# Patient Record
Sex: Female | Born: 1955 | Race: White | Hispanic: No | Marital: Married | Smoking: Former smoker
Health system: Southern US, Community
[De-identification: ages and names within clinical notes are randomized; demographics above are authoritative.]

## PROBLEM LIST (undated history)

## (undated) ENCOUNTER — Ambulatory Visit: Admission: EM | Disposition: A | Discharge status: Home / Self Care | Payer: Medicare (Managed Care)

## (undated) DIAGNOSIS — T4145XA Adverse effect of unspecified anesthetic, initial encounter: Secondary | ICD-10-CM

## (undated) DIAGNOSIS — R0602 Shortness of breath: Secondary | ICD-10-CM

## (undated) DIAGNOSIS — I10 Essential (primary) hypertension: Secondary | ICD-10-CM

## (undated) DIAGNOSIS — F419 Anxiety disorder, unspecified: Secondary | ICD-10-CM

## (undated) DIAGNOSIS — T8859XA Other complications of anesthesia, initial encounter: Secondary | ICD-10-CM

## (undated) DIAGNOSIS — K7581 Nonalcoholic steatohepatitis (NASH): Secondary | ICD-10-CM

## (undated) DIAGNOSIS — H269 Unspecified cataract: Secondary | ICD-10-CM

## (undated) DIAGNOSIS — E7800 Pure hypercholesterolemia, unspecified: Secondary | ICD-10-CM

## (undated) DIAGNOSIS — M7989 Other specified soft tissue disorders: Secondary | ICD-10-CM

## (undated) DIAGNOSIS — K8020 Calculus of gallbladder without cholecystitis without obstruction: Secondary | ICD-10-CM

## (undated) DIAGNOSIS — K7460 Unspecified cirrhosis of liver: Secondary | ICD-10-CM

## (undated) DIAGNOSIS — K219 Gastro-esophageal reflux disease without esophagitis: Secondary | ICD-10-CM

## (undated) DIAGNOSIS — N200 Calculus of kidney: Secondary | ICD-10-CM

## (undated) DIAGNOSIS — F329 Major depressive disorder, single episode, unspecified: Secondary | ICD-10-CM

## (undated) DIAGNOSIS — IMO0002 Reserved for concepts with insufficient information to code with codable children: Secondary | ICD-10-CM

## (undated) DIAGNOSIS — K760 Fatty (change of) liver, not elsewhere classified: Secondary | ICD-10-CM

## (undated) DIAGNOSIS — T7840XA Allergy, unspecified, initial encounter: Secondary | ICD-10-CM

## (undated) DIAGNOSIS — C801 Malignant (primary) neoplasm, unspecified: Secondary | ICD-10-CM

## (undated) DIAGNOSIS — M810 Age-related osteoporosis without current pathological fracture: Secondary | ICD-10-CM

## (undated) DIAGNOSIS — N189 Chronic kidney disease, unspecified: Secondary | ICD-10-CM

## (undated) DIAGNOSIS — E669 Obesity, unspecified: Secondary | ICD-10-CM

## (undated) DIAGNOSIS — K859 Acute pancreatitis without necrosis or infection, unspecified: Secondary | ICD-10-CM

## (undated) DIAGNOSIS — F32A Depression, unspecified: Secondary | ICD-10-CM

## (undated) HISTORY — DX: Allergy, unspecified, initial encounter: T78.40XA

## (undated) HISTORY — DX: Major depressive disorder, single episode, unspecified: F32.9

## (undated) HISTORY — DX: Essential (primary) hypertension: I10

## (undated) HISTORY — PX: BREAST SURGERY: SHX581

## (undated) HISTORY — DX: Unspecified cirrhosis of liver: K74.60

## (undated) HISTORY — DX: Age-related osteoporosis without current pathological fracture: M81.0

## (undated) HISTORY — PX: BACK SURGERY: SHX140

## (undated) HISTORY — DX: Other specified soft tissue disorders: M79.89

## (undated) HISTORY — DX: Anxiety disorder, unspecified: F41.9

## (undated) HISTORY — DX: Acute pancreatitis without necrosis or infection, unspecified: K85.90

## (undated) HISTORY — PX: EYE SURGERY: SHX253

## (undated) HISTORY — DX: Pure hypercholesterolemia, unspecified: E78.00

## (undated) HISTORY — DX: Obesity, unspecified: E66.9

## (undated) HISTORY — DX: Malignant (primary) neoplasm, unspecified: C80.1

## (undated) HISTORY — DX: Fatty (change of) liver, not elsewhere classified: K76.0

## (undated) HISTORY — DX: Nonalcoholic steatohepatitis (NASH): K75.81

## (undated) HISTORY — DX: Chronic kidney disease, unspecified: N18.9

## (undated) HISTORY — PX: SPINE SURGERY: SHX786

## (undated) HISTORY — DX: Reserved for concepts with insufficient information to code with codable children: IMO0002

## (undated) HISTORY — DX: Depression, unspecified: F32.A

## (undated) HISTORY — DX: Unspecified cataract: H26.9

## (undated) HISTORY — DX: Calculus of gallbladder without cholecystitis without obstruction: K80.20

## (undated) HISTORY — DX: Shortness of breath: R06.02

## (undated) HISTORY — DX: Gastro-esophageal reflux disease without esophagitis: K21.9

---
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
## 1998-10-23 HISTORY — PX: HAND SURGERY: SHX662

## 2000-10-23 HISTORY — PX: CARDIAC SURGERY: SHX584

## 2001-01-08 ENCOUNTER — Emergency Department (HOSPITAL_COMMUNITY): Admission: EM | Admit: 2001-01-08 | Discharge: 2001-01-08 | Payer: Self-pay | Admitting: Emergency Medicine

## 2001-01-08 ENCOUNTER — Encounter: Payer: Self-pay | Admitting: Internal Medicine

## 2001-01-11 ENCOUNTER — Ambulatory Visit (HOSPITAL_COMMUNITY): Admission: RE | Admit: 2001-01-11 | Discharge: 2001-01-12 | Payer: Self-pay | Admitting: Internal Medicine

## 2001-04-25 ENCOUNTER — Encounter: Payer: Self-pay | Admitting: Emergency Medicine

## 2001-04-25 ENCOUNTER — Inpatient Hospital Stay (HOSPITAL_COMMUNITY): Admission: EM | Admit: 2001-04-25 | Discharge: 2001-04-26 | Payer: Self-pay | Admitting: Emergency Medicine

## 2001-04-25 ENCOUNTER — Encounter: Payer: Self-pay | Admitting: Cardiology

## 2001-04-26 ENCOUNTER — Encounter: Payer: Self-pay | Admitting: Cardiology

## 2001-08-09 ENCOUNTER — Ambulatory Visit (HOSPITAL_COMMUNITY): Admission: RE | Admit: 2001-08-09 | Discharge: 2001-08-09 | Payer: Self-pay | Admitting: General Surgery

## 2001-08-15 ENCOUNTER — Encounter
Admission: RE | Admit: 2001-08-15 | Discharge: 2001-08-15 | Payer: Self-pay | Admitting: Physical Medicine and Rehabilitation

## 2001-08-15 ENCOUNTER — Encounter: Payer: Self-pay | Admitting: Physical Medicine and Rehabilitation

## 2001-11-20 ENCOUNTER — Encounter: Payer: Self-pay | Admitting: Orthopaedic Surgery

## 2001-11-27 ENCOUNTER — Encounter: Payer: Self-pay | Admitting: Orthopaedic Surgery

## 2001-11-27 ENCOUNTER — Inpatient Hospital Stay (HOSPITAL_COMMUNITY): Admission: RE | Admit: 2001-11-27 | Discharge: 2001-12-01 | Payer: Self-pay | Admitting: Orthopaedic Surgery

## 2003-07-22 ENCOUNTER — Encounter: Payer: Self-pay | Admitting: Orthopaedic Surgery

## 2003-07-22 ENCOUNTER — Inpatient Hospital Stay (HOSPITAL_COMMUNITY): Admission: RE | Admit: 2003-07-22 | Discharge: 2003-07-27 | Payer: Self-pay | Admitting: Orthopaedic Surgery

## 2003-07-27 ENCOUNTER — Encounter: Payer: Self-pay | Admitting: Orthopaedic Surgery

## 2006-10-23 HISTORY — PX: COLONOSCOPY: SHX174

## 2007-02-03 ENCOUNTER — Emergency Department (HOSPITAL_COMMUNITY): Admission: EM | Admit: 2007-02-03 | Discharge: 2007-02-03 | Payer: Self-pay | Admitting: Emergency Medicine

## 2007-03-20 ENCOUNTER — Ambulatory Visit: Payer: Self-pay | Admitting: Cardiology

## 2007-03-21 ENCOUNTER — Inpatient Hospital Stay (HOSPITAL_COMMUNITY): Admission: AD | Admit: 2007-03-21 | Discharge: 2007-03-23 | Payer: Self-pay | Admitting: Cardiology

## 2007-03-21 ENCOUNTER — Ambulatory Visit: Payer: Self-pay | Admitting: Cardiology

## 2007-06-22 LAB — CONVERTED CEMR LAB: Pap Smear: NORMAL

## 2008-04-15 ENCOUNTER — Emergency Department (HOSPITAL_COMMUNITY): Admission: EM | Admit: 2008-04-15 | Discharge: 2008-04-15 | Payer: Self-pay | Admitting: Emergency Medicine

## 2008-05-01 ENCOUNTER — Emergency Department (HOSPITAL_COMMUNITY): Admission: EM | Admit: 2008-05-01 | Discharge: 2008-05-01 | Payer: Self-pay | Admitting: Emergency Medicine

## 2008-05-16 ENCOUNTER — Emergency Department (HOSPITAL_COMMUNITY): Admission: EM | Admit: 2008-05-16 | Discharge: 2008-05-16 | Payer: Self-pay | Admitting: Emergency Medicine

## 2008-06-18 ENCOUNTER — Ambulatory Visit: Payer: Self-pay | Admitting: *Deleted

## 2008-06-18 DIAGNOSIS — I10 Essential (primary) hypertension: Secondary | ICD-10-CM | POA: Insufficient documentation

## 2008-06-18 DIAGNOSIS — M1990 Unspecified osteoarthritis, unspecified site: Secondary | ICD-10-CM | POA: Insufficient documentation

## 2008-06-18 DIAGNOSIS — E669 Obesity, unspecified: Secondary | ICD-10-CM | POA: Insufficient documentation

## 2008-06-18 DIAGNOSIS — M549 Dorsalgia, unspecified: Secondary | ICD-10-CM | POA: Insufficient documentation

## 2008-06-18 DIAGNOSIS — F329 Major depressive disorder, single episode, unspecified: Secondary | ICD-10-CM | POA: Insufficient documentation

## 2008-06-18 DIAGNOSIS — Z87442 Personal history of urinary calculi: Secondary | ICD-10-CM | POA: Insufficient documentation

## 2008-06-18 DIAGNOSIS — I739 Peripheral vascular disease, unspecified: Secondary | ICD-10-CM | POA: Insufficient documentation

## 2008-06-18 DIAGNOSIS — R809 Proteinuria, unspecified: Secondary | ICD-10-CM | POA: Insufficient documentation

## 2008-06-18 DIAGNOSIS — K219 Gastro-esophageal reflux disease without esophagitis: Secondary | ICD-10-CM | POA: Insufficient documentation

## 2008-06-18 DIAGNOSIS — R32 Unspecified urinary incontinence: Secondary | ICD-10-CM | POA: Insufficient documentation

## 2008-06-18 DIAGNOSIS — E785 Hyperlipidemia, unspecified: Secondary | ICD-10-CM | POA: Insufficient documentation

## 2008-06-18 DIAGNOSIS — G4700 Insomnia, unspecified: Secondary | ICD-10-CM | POA: Insufficient documentation

## 2008-06-18 LAB — CONVERTED CEMR LAB
Bilirubin Urine: NEGATIVE
Blood in Urine, dipstick: NEGATIVE
Glucose, Urine, Semiquant: NEGATIVE
Ketones, urine, test strip: NEGATIVE
Nitrite: NEGATIVE
Specific Gravity, Urine: 1.02
Urobilinogen, UA: 0.2
WBC Urine, dipstick: NEGATIVE
pH: 5

## 2008-06-22 ENCOUNTER — Telehealth (INDEPENDENT_AMBULATORY_CARE_PROVIDER_SITE_OTHER): Payer: Self-pay | Admitting: *Deleted

## 2008-06-22 LAB — CONVERTED CEMR LAB
ALT: 42 units/L — ABNORMAL HIGH (ref 0–35)
AST: 49 units/L — ABNORMAL HIGH (ref 0–37)
Albumin: 3.8 g/dL (ref 3.5–5.2)
Alkaline Phosphatase: 84 units/L (ref 39–117)
BUN: 14 mg/dL (ref 6–23)
Basophils Absolute: 0 10*3/uL (ref 0.0–0.1)
Basophils Relative: 0.4 % (ref 0.0–3.0)
CO2: 32 meq/L (ref 19–32)
Calcium: 9.4 mg/dL (ref 8.4–10.5)
Chloride: 104 meq/L (ref 96–112)
Cholesterol: 119 mg/dL (ref 0–200)
Creatinine, Ser: 0.8 mg/dL (ref 0.4–1.2)
Eosinophils Absolute: 0.1 10*3/uL (ref 0.0–0.7)
Eosinophils Relative: 1.5 % (ref 0.0–5.0)
GFR calc Af Amer: 97 mL/min
GFR calc non Af Amer: 80 mL/min
Glucose, Bld: 154 mg/dL — ABNORMAL HIGH (ref 70–99)
HCT: 40.8 % (ref 36.0–46.0)
HDL: 28.4 mg/dL — ABNORMAL LOW (ref >39.0)
Hemoglobin: 14 g/dL (ref 12.0–15.0)
Hgb A1c MFr Bld: 7.7 % — ABNORMAL HIGH (ref 4.6–6.0)
LDL Cholesterol: 68 mg/dL (ref 0–99)
Lymphocytes Relative: 26.8 % (ref 12.0–46.0)
MCHC: 34.3 g/dL (ref 30.0–36.0)
MCV: 89.6 fL (ref 78.0–100.0)
Monocytes Absolute: 0.6 10*3/uL (ref 0.1–1.0)
Monocytes Relative: 7.9 % (ref 3.0–12.0)
Neutro Abs: 5.2 10*3/uL (ref 1.4–7.7)
Neutrophils Relative %: 63.4 % (ref 43.0–77.0)
Platelets: 226 10*3/uL (ref 150–400)
Potassium: 4.1 meq/L (ref 3.5–5.1)
RBC: 4.55 M/uL (ref 3.87–5.11)
RDW: 12.9 % (ref 11.5–14.6)
Sodium: 141 meq/L (ref 135–145)
TSH: 1.1 microintl units/mL (ref 0.35–5.50)
Total Bilirubin: 0.7 mg/dL (ref 0.3–1.2)
Total CHOL/HDL Ratio: 4.2
Total Protein: 6.9 g/dL (ref 6.0–8.3)
Triglycerides: 115 mg/dL (ref 0–149)
VLDL: 23 mg/dL (ref 0–40)
WBC: 8.1 10*3/uL (ref 4.5–10.5)

## 2008-07-01 ENCOUNTER — Encounter (INDEPENDENT_AMBULATORY_CARE_PROVIDER_SITE_OTHER): Payer: Self-pay | Admitting: *Deleted

## 2008-07-20 ENCOUNTER — Ambulatory Visit: Payer: Self-pay | Admitting: *Deleted

## 2008-07-20 DIAGNOSIS — E1149 Type 2 diabetes mellitus with other diabetic neurological complication: Secondary | ICD-10-CM | POA: Insufficient documentation

## 2008-07-20 DIAGNOSIS — R740 Nonspecific elevation of levels of transaminase and lactic acid dehydrogenase [LDH]: Secondary | ICD-10-CM

## 2008-08-03 ENCOUNTER — Ambulatory Visit: Payer: Self-pay | Admitting: Internal Medicine

## 2008-08-12 ENCOUNTER — Ambulatory Visit: Payer: Self-pay | Admitting: *Deleted

## 2008-08-12 DIAGNOSIS — R609 Edema, unspecified: Secondary | ICD-10-CM | POA: Insufficient documentation

## 2008-08-14 ENCOUNTER — Telehealth (INDEPENDENT_AMBULATORY_CARE_PROVIDER_SITE_OTHER): Payer: Self-pay | Admitting: *Deleted

## 2008-08-17 ENCOUNTER — Encounter: Payer: Self-pay | Admitting: Internal Medicine

## 2008-08-19 ENCOUNTER — Ambulatory Visit: Payer: Self-pay | Admitting: *Deleted

## 2008-08-19 ENCOUNTER — Ambulatory Visit (HOSPITAL_BASED_OUTPATIENT_CLINIC_OR_DEPARTMENT_OTHER): Admission: RE | Admit: 2008-08-19 | Discharge: 2008-08-19 | Payer: Self-pay | Admitting: *Deleted

## 2008-08-19 DIAGNOSIS — J42 Unspecified chronic bronchitis: Secondary | ICD-10-CM | POA: Insufficient documentation

## 2008-08-19 DIAGNOSIS — J45909 Unspecified asthma, uncomplicated: Secondary | ICD-10-CM | POA: Insufficient documentation

## 2008-08-19 DIAGNOSIS — J309 Allergic rhinitis, unspecified: Secondary | ICD-10-CM | POA: Insufficient documentation

## 2008-08-19 LAB — CONVERTED CEMR LAB: Rapid Strep: NEGATIVE

## 2008-09-01 ENCOUNTER — Ambulatory Visit: Payer: Self-pay | Admitting: *Deleted

## 2008-09-23 ENCOUNTER — Ambulatory Visit: Payer: Self-pay | Admitting: *Deleted

## 2008-09-23 LAB — CONVERTED CEMR LAB
ALT: 34 units/L (ref 0–35)
AST: 34 units/L (ref 0–37)
Albumin: 3.6 g/dL (ref 3.5–5.2)
Alkaline Phosphatase: 70 units/L (ref 39–117)
BUN: 15 mg/dL (ref 6–23)
CO2: 30 meq/L (ref 19–32)
Calcium: 9 mg/dL (ref 8.4–10.5)
Chloride: 105 meq/L (ref 96–112)
Creatinine, Ser: 0.7 mg/dL (ref 0.4–1.2)
GFR calc Af Amer: 113 mL/min
GFR calc non Af Amer: 93 mL/min
Glucose, Bld: 205 mg/dL — ABNORMAL HIGH (ref 70–99)
Hgb A1c MFr Bld: 7 % — ABNORMAL HIGH (ref 4.6–6.0)
Potassium: 4.4 meq/L (ref 3.5–5.1)
Sodium: 141 meq/L (ref 135–145)
Total Bilirubin: 0.6 mg/dL (ref 0.3–1.2)
Total Protein: 6.6 g/dL (ref 6.0–8.3)

## 2008-10-26 ENCOUNTER — Ambulatory Visit: Payer: Self-pay | Admitting: *Deleted

## 2008-11-06 ENCOUNTER — Ambulatory Visit: Payer: Self-pay | Admitting: *Deleted

## 2008-11-10 ENCOUNTER — Encounter (INDEPENDENT_AMBULATORY_CARE_PROVIDER_SITE_OTHER): Payer: Self-pay | Admitting: *Deleted

## 2008-11-10 ENCOUNTER — Encounter: Payer: Self-pay | Admitting: Internal Medicine

## 2008-11-10 LAB — CONVERTED CEMR LAB: Protein, Ur: 168 mg/24hr — ABNORMAL HIGH (ref 50–100)

## 2008-11-13 ENCOUNTER — Encounter: Admission: RE | Admit: 2008-11-13 | Discharge: 2008-11-13 | Payer: Self-pay | Admitting: Orthopaedic Surgery

## 2008-12-14 ENCOUNTER — Encounter (INDEPENDENT_AMBULATORY_CARE_PROVIDER_SITE_OTHER): Payer: Self-pay | Admitting: *Deleted

## 2008-12-24 ENCOUNTER — Encounter (INDEPENDENT_AMBULATORY_CARE_PROVIDER_SITE_OTHER): Payer: Self-pay | Admitting: *Deleted

## 2008-12-28 ENCOUNTER — Encounter (INDEPENDENT_AMBULATORY_CARE_PROVIDER_SITE_OTHER): Payer: Self-pay | Admitting: *Deleted

## 2009-01-01 ENCOUNTER — Telehealth (INDEPENDENT_AMBULATORY_CARE_PROVIDER_SITE_OTHER): Payer: Self-pay | Admitting: *Deleted

## 2009-01-25 ENCOUNTER — Ambulatory Visit: Payer: Self-pay | Admitting: *Deleted

## 2009-01-25 LAB — CONVERTED CEMR LAB
ALT: 28 units/L (ref 0–35)
AST: 34 units/L (ref 0–37)
Albumin: 3.8 g/dL (ref 3.5–5.2)
Alkaline Phosphatase: 74 units/L (ref 39–117)
BUN: 10 mg/dL (ref 6–23)
CO2: 28 meq/L (ref 19–32)
Calcium: 9 mg/dL (ref 8.4–10.5)
Chloride: 104 meq/L (ref 96–112)
Creatinine, Ser: 0.7 mg/dL (ref 0.4–1.2)
GFR calc non Af Amer: 93.11 mL/min (ref >60)
Glucose, Bld: 167 mg/dL — ABNORMAL HIGH (ref 70–99)
Hgb A1c MFr Bld: 6.9 % — ABNORMAL HIGH (ref 4.6–6.5)
Potassium: 3.9 meq/L (ref 3.5–5.1)
Sodium: 139 meq/L (ref 135–145)
Total Bilirubin: 0.6 mg/dL (ref 0.3–1.2)
Total Protein: 6.5 g/dL (ref 6.0–8.3)

## 2009-02-10 ENCOUNTER — Encounter: Admission: RE | Admit: 2009-02-10 | Discharge: 2009-02-10 | Payer: Self-pay | Admitting: Orthopaedic Surgery

## 2009-02-23 ENCOUNTER — Encounter: Payer: Self-pay | Admitting: Internal Medicine

## 2009-03-04 ENCOUNTER — Encounter: Payer: Self-pay | Admitting: Internal Medicine

## 2009-04-06 ENCOUNTER — Ambulatory Visit: Payer: Self-pay | Admitting: Family Medicine

## 2009-04-06 DIAGNOSIS — I8310 Varicose veins of unspecified lower extremity with inflammation: Secondary | ICD-10-CM | POA: Insufficient documentation

## 2009-05-03 ENCOUNTER — Telehealth (INDEPENDENT_AMBULATORY_CARE_PROVIDER_SITE_OTHER): Payer: Self-pay | Admitting: *Deleted

## 2009-05-18 ENCOUNTER — Ambulatory Visit: Payer: Self-pay | Admitting: Family Medicine

## 2009-05-18 ENCOUNTER — Encounter: Payer: Self-pay | Admitting: Internal Medicine

## 2009-05-18 LAB — CONVERTED CEMR LAB
Bilirubin Urine: NEGATIVE
Blood in Urine, dipstick: NEGATIVE
Glucose, Urine, Semiquant: NEGATIVE
Ketones, urine, test strip: NEGATIVE
Nitrite: NEGATIVE
Protein, U semiquant: NEGATIVE
Specific Gravity, Urine: >=1.03
Urobilinogen, UA: 0.2
WBC Urine, dipstick: NEGATIVE
pH: 5

## 2009-05-20 LAB — CONVERTED CEMR LAB
Hgb A1c MFr Bld: 6.6 % — ABNORMAL HIGH (ref 4.6–6.1)
Microalb, Ur: 0.79 mg/dL (ref 0.00–1.89)

## 2009-06-08 ENCOUNTER — Telehealth: Payer: Self-pay | Admitting: Family Medicine

## 2009-06-16 ENCOUNTER — Encounter: Payer: Self-pay | Admitting: Family Medicine

## 2009-07-13 ENCOUNTER — Encounter: Payer: Self-pay | Admitting: Family Medicine

## 2009-08-12 ENCOUNTER — Ambulatory Visit: Payer: Self-pay | Admitting: Family Medicine

## 2009-08-24 ENCOUNTER — Ambulatory Visit: Payer: Self-pay | Admitting: Family Medicine

## 2009-08-24 LAB — CONVERTED CEMR LAB: Hgb A1c MFr Bld: 7.3 %

## 2009-08-25 LAB — CONVERTED CEMR LAB
ALT: 26 units/L (ref 0–35)
AST: 30 units/L (ref 0–37)
Albumin: 4.4 g/dL (ref 3.5–5.2)
Alkaline Phosphatase: 94 units/L (ref 39–117)
BUN: 16 mg/dL (ref 6–23)
CO2: 25 meq/L (ref 19–32)
Calcium: 9.5 mg/dL (ref 8.4–10.5)
Chloride: 103 meq/L (ref 96–112)
Cholesterol: 139 mg/dL (ref 0–200)
Creatinine, Ser: 0.78 mg/dL (ref 0.40–1.20)
Glucose, Bld: 105 mg/dL — ABNORMAL HIGH (ref 70–99)
HDL: 41 mg/dL (ref >39)
LDL Cholesterol: 75 mg/dL (ref 0–99)
Potassium: 4.3 meq/L (ref 3.5–5.3)
Sodium: 143 meq/L (ref 135–145)
TSH: 1.613 microintl units/mL (ref 0.350–4.500)
Total Bilirubin: 0.4 mg/dL (ref 0.3–1.2)
Total CHOL/HDL Ratio: 3.4
Total Protein: 7.1 g/dL (ref 6.0–8.3)
Triglycerides: 114 mg/dL (ref <150)
VLDL: 23 mg/dL (ref 0–40)

## 2009-09-10 ENCOUNTER — Telehealth: Payer: Self-pay | Admitting: Family Medicine

## 2009-09-14 ENCOUNTER — Encounter: Payer: Self-pay | Admitting: Family Medicine

## 2009-09-28 ENCOUNTER — Encounter: Payer: Self-pay | Admitting: Family Medicine

## 2009-10-23 HISTORY — PX: CARDIAC CATHETERIZATION: SHX172

## 2009-11-25 ENCOUNTER — Telehealth: Payer: Self-pay | Admitting: Family Medicine

## 2009-11-25 ENCOUNTER — Ambulatory Visit: Payer: Self-pay | Admitting: Family Medicine

## 2009-11-25 DIAGNOSIS — R002 Palpitations: Secondary | ICD-10-CM | POA: Insufficient documentation

## 2009-11-26 ENCOUNTER — Encounter: Payer: Self-pay | Admitting: Family Medicine

## 2009-12-01 ENCOUNTER — Ambulatory Visit: Payer: Self-pay | Admitting: Cardiology

## 2009-12-01 DIAGNOSIS — I471 Supraventricular tachycardia: Secondary | ICD-10-CM | POA: Insufficient documentation

## 2009-12-03 ENCOUNTER — Ambulatory Visit: Payer: Self-pay | Admitting: Cardiology

## 2009-12-06 ENCOUNTER — Telehealth: Payer: Self-pay | Admitting: Cardiology

## 2009-12-08 ENCOUNTER — Encounter: Payer: Self-pay | Admitting: Family Medicine

## 2009-12-15 ENCOUNTER — Encounter: Payer: Self-pay | Admitting: Cardiology

## 2009-12-15 ENCOUNTER — Ambulatory Visit: Payer: Self-pay

## 2009-12-15 ENCOUNTER — Ambulatory Visit: Payer: Self-pay | Admitting: Cardiology

## 2009-12-15 ENCOUNTER — Ambulatory Visit (HOSPITAL_COMMUNITY): Admission: RE | Admit: 2009-12-15 | Discharge: 2009-12-15 | Payer: Self-pay | Admitting: Cardiology

## 2009-12-23 ENCOUNTER — Ambulatory Visit: Payer: Self-pay | Admitting: Family Medicine

## 2009-12-30 ENCOUNTER — Ambulatory Visit: Payer: Self-pay | Admitting: Cardiology

## 2009-12-31 ENCOUNTER — Telehealth (INDEPENDENT_AMBULATORY_CARE_PROVIDER_SITE_OTHER): Payer: Self-pay | Admitting: *Deleted

## 2010-01-24 ENCOUNTER — Telehealth: Payer: Self-pay | Admitting: Cardiology

## 2010-01-24 ENCOUNTER — Observation Stay (HOSPITAL_COMMUNITY): Admission: EM | Admit: 2010-01-24 | Discharge: 2010-01-27 | Payer: Self-pay | Admitting: Emergency Medicine

## 2010-01-24 ENCOUNTER — Ambulatory Visit: Payer: Self-pay | Admitting: Cardiology

## 2010-01-25 ENCOUNTER — Encounter (INDEPENDENT_AMBULATORY_CARE_PROVIDER_SITE_OTHER): Payer: Self-pay | Admitting: Internal Medicine

## 2010-01-27 ENCOUNTER — Encounter: Payer: Self-pay | Admitting: Family Medicine

## 2010-01-31 ENCOUNTER — Telehealth: Payer: Self-pay | Admitting: Cardiology

## 2010-01-31 ENCOUNTER — Ambulatory Visit: Payer: Self-pay | Admitting: Family Medicine

## 2010-01-31 DIAGNOSIS — R079 Chest pain, unspecified: Secondary | ICD-10-CM | POA: Insufficient documentation

## 2010-01-31 DIAGNOSIS — F411 Generalized anxiety disorder: Secondary | ICD-10-CM | POA: Insufficient documentation

## 2010-02-01 ENCOUNTER — Encounter: Payer: Self-pay | Admitting: Family Medicine

## 2010-02-01 LAB — CONVERTED CEMR LAB
ALT: 19 units/L (ref 0–35)
AST: 20 units/L (ref 0–37)
Albumin: 4.4 g/dL (ref 3.5–5.2)
Alkaline Phosphatase: 86 units/L (ref 39–117)
Amylase: 34 units/L (ref 0–105)
BUN: 14 mg/dL (ref 6–23)
CO2: 27 meq/L (ref 19–32)
Calcium: 9.6 mg/dL (ref 8.4–10.5)
Chloride: 103 meq/L (ref 96–112)
Creatinine, Ser: 0.73 mg/dL (ref 0.40–1.20)
Glucose, Bld: 145 mg/dL — ABNORMAL HIGH (ref 70–99)
Lipase: 62 units/L (ref 0–75)
Potassium: 4.1 meq/L (ref 3.5–5.3)
Sodium: 141 meq/L (ref 135–145)
Total Bilirubin: 0.4 mg/dL (ref 0.3–1.2)
Total Protein: 6.8 g/dL (ref 6.0–8.3)

## 2010-02-07 ENCOUNTER — Encounter: Payer: Self-pay | Admitting: Cardiology

## 2010-02-09 ENCOUNTER — Ambulatory Visit: Payer: Self-pay | Admitting: Cardiology

## 2010-02-14 ENCOUNTER — Inpatient Hospital Stay (HOSPITAL_BASED_OUTPATIENT_CLINIC_OR_DEPARTMENT_OTHER): Admission: RE | Admit: 2010-02-14 | Discharge: 2010-02-14 | Payer: Self-pay | Admitting: Cardiology

## 2010-02-14 ENCOUNTER — Ambulatory Visit: Payer: Self-pay | Admitting: Cardiology

## 2010-03-01 ENCOUNTER — Ambulatory Visit: Payer: Self-pay | Admitting: Family Medicine

## 2010-03-01 LAB — CONVERTED CEMR LAB
Bilirubin Urine: NEGATIVE
Blood in Urine, dipstick: NEGATIVE
Glucose, Urine, Semiquant: NEGATIVE
Hgb A1c MFr Bld: 6.4 %
Ketones, urine, test strip: NEGATIVE
Nitrite: NEGATIVE
Protein, U semiquant: 30
Specific Gravity, Urine: 1.02
Urobilinogen, UA: 0.2
pH: 7

## 2010-03-02 ENCOUNTER — Encounter: Payer: Self-pay | Admitting: Family Medicine

## 2010-03-09 ENCOUNTER — Ambulatory Visit: Payer: Self-pay | Admitting: Cardiology

## 2010-03-15 ENCOUNTER — Encounter: Payer: Self-pay | Admitting: Family Medicine

## 2010-03-24 ENCOUNTER — Telehealth: Payer: Self-pay | Admitting: Family Medicine

## 2010-04-11 ENCOUNTER — Telehealth (INDEPENDENT_AMBULATORY_CARE_PROVIDER_SITE_OTHER): Payer: Self-pay | Admitting: *Deleted

## 2010-04-21 ENCOUNTER — Encounter: Payer: Self-pay | Admitting: Family Medicine

## 2010-06-01 ENCOUNTER — Encounter: Payer: Self-pay | Admitting: Family Medicine

## 2010-06-01 ENCOUNTER — Telehealth: Payer: Self-pay | Admitting: Cardiology

## 2010-06-02 ENCOUNTER — Ambulatory Visit: Payer: Self-pay | Admitting: Family Medicine

## 2010-06-02 LAB — CONVERTED CEMR LAB
Albumin/Creatinine Ratio, Urine, POC: <30
Creatinine,U: 200 mg/dL
Hgb A1c MFr Bld: 6.3 %
Microalbumin U total vol: 30 mg/L

## 2010-06-14 ENCOUNTER — Ambulatory Visit (HOSPITAL_COMMUNITY): Admission: RE | Admit: 2010-06-14 | Discharge: 2010-06-14 | Payer: Self-pay | Admitting: Surgery

## 2010-06-15 ENCOUNTER — Ambulatory Visit (HOSPITAL_COMMUNITY): Admission: RE | Admit: 2010-06-15 | Discharge: 2010-06-15 | Payer: Self-pay | Admitting: Surgery

## 2010-06-20 ENCOUNTER — Encounter
Admission: RE | Admit: 2010-06-20 | Discharge: 2010-07-22 | Payer: Self-pay | Source: Home / Self Care | Admitting: Surgery

## 2010-07-05 ENCOUNTER — Encounter: Payer: Self-pay | Admitting: Family Medicine

## 2010-07-07 ENCOUNTER — Encounter: Payer: Self-pay | Admitting: Family Medicine

## 2010-07-11 ENCOUNTER — Telehealth (INDEPENDENT_AMBULATORY_CARE_PROVIDER_SITE_OTHER): Payer: Self-pay | Admitting: *Deleted

## 2010-07-13 ENCOUNTER — Telehealth (INDEPENDENT_AMBULATORY_CARE_PROVIDER_SITE_OTHER): Payer: Self-pay | Admitting: *Deleted

## 2010-07-15 ENCOUNTER — Telehealth (INDEPENDENT_AMBULATORY_CARE_PROVIDER_SITE_OTHER): Payer: Self-pay | Admitting: *Deleted

## 2010-08-03 ENCOUNTER — Ambulatory Visit: Payer: Self-pay | Admitting: Family Medicine

## 2010-08-08 ENCOUNTER — Encounter
Admission: RE | Admit: 2010-08-08 | Discharge: 2010-11-06 | Payer: Self-pay | Source: Home / Self Care | Attending: Surgery | Admitting: Surgery

## 2010-08-09 ENCOUNTER — Encounter: Payer: Self-pay | Admitting: Internal Medicine

## 2010-08-10 ENCOUNTER — Telehealth (INDEPENDENT_AMBULATORY_CARE_PROVIDER_SITE_OTHER): Payer: Self-pay | Admitting: *Deleted

## 2010-08-10 ENCOUNTER — Encounter: Payer: Self-pay | Admitting: Family Medicine

## 2010-08-22 ENCOUNTER — Inpatient Hospital Stay (HOSPITAL_COMMUNITY): Admission: RE | Admit: 2010-08-22 | Discharge: 2010-08-25 | Payer: Self-pay | Admitting: Surgery

## 2010-08-22 HISTORY — PX: GASTRIC BYPASS: SHX52

## 2010-08-23 ENCOUNTER — Ambulatory Visit: Payer: Self-pay | Admitting: Surgery

## 2010-08-23 ENCOUNTER — Encounter (INDEPENDENT_AMBULATORY_CARE_PROVIDER_SITE_OTHER): Payer: Self-pay | Admitting: Surgery

## 2010-09-07 ENCOUNTER — Encounter: Payer: Self-pay | Admitting: Family Medicine

## 2010-09-14 ENCOUNTER — Ambulatory Visit: Payer: Self-pay | Admitting: Family Medicine

## 2010-09-14 LAB — CONVERTED CEMR LAB: Hgb A1c MFr Bld: 6 %

## 2010-10-20 ENCOUNTER — Telehealth (INDEPENDENT_AMBULATORY_CARE_PROVIDER_SITE_OTHER): Payer: Self-pay | Admitting: *Deleted

## 2010-11-08 ENCOUNTER — Encounter: Payer: Self-pay | Admitting: Family Medicine

## 2010-11-16 ENCOUNTER — Encounter: Payer: Self-pay | Admitting: Family Medicine

## 2010-11-20 LAB — CONVERTED CEMR LAB
BUN: 13 mg/dL (ref 6–23)
BUN: 14 mg/dL (ref 6–23)
Basophils Absolute: 0 10*3/uL (ref 0.0–0.1)
Basophils Relative: 0.4 % (ref 0.0–3.0)
CO2: 30 meq/L (ref 19–32)
CO2: 31 meq/L (ref 19–32)
Calcium: 9.1 mg/dL (ref 8.4–10.5)
Calcium: 9.2 mg/dL (ref 8.4–10.5)
Chloride: 102 meq/L (ref 96–112)
Chloride: 102 meq/L (ref 96–112)
Creatinine, Ser: 0.63 mg/dL (ref 0.40–1.20)
Creatinine, Ser: 0.7 mg/dL (ref 0.4–1.2)
Eosinophils Absolute: 0.1 10*3/uL (ref 0.0–0.7)
Eosinophils Relative: 1.6 % (ref 0.0–5.0)
GFR calc non Af Amer: 92.74 mL/min (ref >60)
Glucose, Bld: 155 mg/dL — ABNORMAL HIGH (ref 70–99)
Glucose, Bld: 72 mg/dL (ref 70–99)
HCT: 36.2 % (ref 36.0–46.0)
HCT: 41.5 % (ref 36.0–46.0)
Hemoglobin: 12.3 g/dL (ref 12.0–15.0)
Hemoglobin: 12.9 g/dL (ref 12.0–15.0)
Hgb A1c MFr Bld: 7.3 %
INR: 1 (ref 0.8–1.0)
Lymphocytes Relative: 24.8 % (ref 12.0–46.0)
Lymphs Abs: 2.1 10*3/uL (ref 0.7–4.0)
MCHC: 31.1 g/dL (ref 30.0–36.0)
MCHC: 33.9 g/dL (ref 30.0–36.0)
MCV: 86.2 fL (ref 78.0–100.0)
MCV: 89.1 fL (ref 78.0–100.0)
Monocytes Absolute: 0.6 10*3/uL (ref 0.1–1.0)
Monocytes Relative: 7.3 % (ref 3.0–12.0)
Neutro Abs: 5.5 10*3/uL (ref 1.4–7.7)
Neutrophils Relative %: 65.9 % (ref 43.0–77.0)
Platelets: 244 10*3/uL (ref 150.0–400.0)
Platelets: 255 10*3/uL (ref 150–400)
Potassium: 3.7 meq/L (ref 3.5–5.1)
Potassium: 4.2 meq/L (ref 3.5–5.3)
Prothrombin Time: 10.8 s (ref 9.1–11.7)
RBC: 4.2 M/uL (ref 3.87–5.11)
RBC: 4.66 M/uL (ref 3.87–5.11)
RDW: 15.2 % (ref 11.5–15.5)
RDW: 15.6 % — ABNORMAL HIGH (ref 11.5–14.6)
Sodium: 142 meq/L (ref 135–145)
Sodium: 143 meq/L (ref 135–145)
TSH: 1.069 microintl units/mL (ref 0.350–4.500)
WBC: 8.4 10*3/uL (ref 4.5–10.5)
WBC: 9.1 10*3/uL (ref 4.0–10.5)

## 2010-11-22 NOTE — Progress Notes (Signed)
Summary: chest pain  Phone Note Call from Patient Call back at Home Phone [phone]   Caller: Patient Reason for Call: Talk to Nurse Summary of Call: chest pain starting on Saturday, feels like a ton of bricks are sitting on her chest, going to see PCP today @ 10 Initial call taken by: Migdalia Dk,  January 31, 2010 9:09 AM  Follow-up for Phone Call        [facility] Katina Dung, RN, BSN  January 31, 2010 12:44 PM  reviewed with Dr Era Skeen new recommendations--appt with Dr Shirlee Latch 02-09-10--pt saw Thomos Lemons  today

## 2010-11-22 NOTE — Assessment & Plan Note (Signed)
Summary: samples/ stasis derm   Vital Signs:  Patient profile:   55 year old female Height:      67 inches Weight:      280 pounds BMI:     44.01 O2 Sat:      93 % on Room air Temp:     98.0 degrees F oral Pulse rate:   81 / minute BP sitting:   106 / 82  (left arm) Cuff size:   large  Vitals Entered By: Payton Spark CMA (April 06, 2009 2:16 PM)  O2 Flow:  Room air CC: FU. Pt needs samples of Actos and Lipitor.    Primary Care Provider:  Seymour Bars DO  CC:  FU. Pt needs samples of Actos and Lipitor. Marland Kitchen  History of Present Illness: 55 yo WF presents just for samples of Lipitor and Actos due to financial strain and had a question about leg swelling.  She is having back surgery on June 22nd with Dr Noel Gerold and wanted to know if she needed to do anything for R>L leg swelling and redness w/o pain or ulceration.  She is diabetic and morbidly obese.  Not on lasix and does not wear compression hose.  Current Medications (verified): 1)  Altace 2.5 Mg Caps (Ramipril) .... Take 1 Tablet By Mouth Once A Day 2)  Bayer Aspirin Ec Low Dose 81 Mg Tbec (Aspirin) .... Take 1 Tablet By Mouth Once A Day 3)  Actos 45 Mg Tabs (Pioglitazone Hcl) .... Take 1 Tablet By Mouth Once A Day 4)  Amitriptyline Hcl 25 Mg Tabs (Amitriptyline Hcl) .Marland Kitchen.. 1 To 2 Tabs Daily By Mouth At Bedtime 5)  Lipitor 10 Mg Tabs (Atorvastatin Calcium) .... 1/2 Tablet A Day 6)  Omeprazole 20 Mg Cpdr (Omeprazole) .... Take 1 Tablet By Mouth Once A Day 7)  Glyburide-Metformin 5-500 Mg Tabs (Glyburide-Metformin) .... Take 2  Tablets By Mouth in The Am and 1 Tablet By Mouth in The Pm 8)  Pristiq 100 Mg Xr24h-Tab (Desvenlafaxine Succinate) .... One Tab By Mouth Once Daily 9)  Hydrochlorothiazide 12.5 Mg Caps (Hydrochlorothiazide) .Marland Kitchen.. 1 Cap By Mouth Once Daily 10)  Fluticasone Propionate 50 Mcg/act Susp (Fluticasone Propionate) .Marland Kitchen.. 1 Spray Each Nostril Twice Daily 11)  Lortab 5 5-500 Mg Tabs (Hydrocodone-Acetaminophen) .... One By  Mouth Two Times A Day Prn 12)  Ventolin Hfa 108 (90 Base) Mcg/act Aers (Albuterol Sulfate) .Marland Kitchen.. 1-2 Puffs Every 3-4 Hours As Needed Shortness of Breath  Allergies (verified): 1)  Claritin  Comments:  Nurse/Medical Assistant: The patient's medications were reviewed with the patient and were updated in the Medication List.  Past History:  Past Medical History: Reviewed history from 08/19/2008 and no changes required. PROTEINURIA (ICD-791.0) - sees urology HYPERTENSION (ICD-401.9) BACK PAIN, CHRONIC (ICD-724.5) - sees ortho and they write for pain meds INSOMNIA (ICD-780.52) GERD (ICD-530.81) HYPERLIPIDEMIA (ICD-272.4) OSTEOARTHRITIS (ICD-715.90) NEPHROLITHIASIS, HX OF (ICD-V13.01) - sees urology DIABETES MELLITUS, TYPE II (ICD-250.00) DEPRESSION (ICD-311) Peripheral vascular disease  reactive airway disease with recurrent bronchitis and h/o pneumonia  Social History: Reviewed history from 08/03/2008 and no changes required. 2 children (one biologic -in their 100's and one adopted who is 55 yo) Occupation:disabled Married Former smoker  Review of Systems      See HPI  Physical Exam  General:  obese WF in NAD Extremities:  bilat 1+ pitting LE edema with stasis dermatitis and prominent spider veins.   Impression & Recommendations:  Problem # 1:  DIABETES MELLITUS, TYPE II (ICD-250.00) Samples of ACTOS  and Lipitor given to pt.  She is not due for diabetes f/u for 1 more month.  Will schedule. Her updated medication list for this problem includes:    Altace 2.5 Mg Caps (Ramipril) .Marland Kitchen... Take 1 tablet by mouth once a day    Bayer Aspirin Ec Low Dose 81 Mg Tbec (Aspirin) .Marland Kitchen... Take 1 tablet by mouth once a day    Actos 45 Mg Tabs (Pioglitazone hcl) .Marland Kitchen... Take 1 tablet by mouth once a day    Glyburide-metformin 5-500 Mg Tabs (Glyburide-metformin) .Marland Kitchen... 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  Due  to obesity, lack of exercise.  discussed use of OTC TED hose, leg elevation and sodium resitriction.   Complete Medication List: 1)  Altace 2.5 Mg Caps (Ramipril) .... Take 1 tablet by mouth once a day 2)  Bayer Aspirin Ec Low Dose 81 Mg Tbec (Aspirin) .... Take 1 tablet by mouth once a day 3)  Actos 45 Mg Tabs (Pioglitazone hcl) .... Take 1 tablet by mouth once a day 4)  Amitriptyline Hcl 25 Mg Tabs (Amitriptyline hcl) .Marland Kitchen.. 1 to 2 tabs daily by mouth at bedtime 5)  Lipitor 10 Mg Tabs (Atorvastatin calcium) .... 1/2 tablet a day 6)  Omeprazole 20 Mg Cpdr (Omeprazole) .... Take 1 tablet by mouth once a day 7)  Glyburide-metformin 5-500 Mg Tabs (Glyburide-metformin) .... Take 2  tablets by mouth in the am and 1 tablet by mouth in the pm 8)  Pristiq 100 Mg Xr24h-tab (Desvenlafaxine succinate) .... One tab by mouth once daily 9)  Hydrochlorothiazide 12.5 Mg Caps (Hydrochlorothiazide) .Marland Kitchen.. 1 cap by mouth once daily 10)  Fluticasone Propionate 50 Mcg/act Susp (Fluticasone propionate) .Marland Kitchen.. 1 spray each nostril twice daily 11)  Lortab 5 5-500 Mg Tabs (Hydrocodone-acetaminophen) .... One by mouth two times a day prn 12)  Ventolin Hfa 108 (90 Base) Mcg/act Aers (Albuterol sulfate) .Marland Kitchen.. 1-2 puffs every 3-4 hours as needed shortness of breath  Patient Instructions: 1)  Return end of July for f/u diabetes

## 2010-11-22 NOTE — Assessment & Plan Note (Signed)
Summary: 3 week rov-ch   Vital Signs:  Patient Profile:   55 Years Old Female Height:     67 inches Weight:      281 pounds BMI:     44.17 O2 treatment:    Room Air Temp:     98.3 degrees F oral Pulse rate:   88 / minute Pulse rhythm:   regular Resp:     18 per minute BP sitting:   120 / 74  (left arm) Cuff size:   large  Vitals Entered By: Darra Lis RMA (September 23, 2008 10:47 AM)                 Visit Type:  follow up PCP:  Paulo Fruit MD  Chief Complaint:  follow up.  History of Present Illness: Patient is having a harder time dealing with things lately, and she feels weepy more often. When she first started pristiq, the symptoms were well controlled, but now she is having breakthrough depression symptoms. no suicidal/homocidal thoughts/plans.  Would like to go up on meds.  does not want counseling.  BGM running 80 -120 - she is due for labs.  Also, patient with a history of proteinuria and strong family history of kidney failure.  needs local nephrologist.  BP meds working well.  Hyperlipidemia doing well on current meds and labs UTD.        Updated Prior Medication List: ALTACE 2.5 MG CAPS (RAMIPRIL) Take 1 tablet by mouth once a day BAYER ASPIRIN EC LOW DOSE 81 MG TBEC (ASPIRIN) Take 1 tablet by mouth once a day ACTOS 45 MG TABS (PIOGLITAZONE HCL) Take 1 tablet by mouth once a day AMITRIPTYLINE HCL 25 MG TABS (AMITRIPTYLINE HCL) 1 to 2 tabs daily by mouth at bedtime LIPITOR 10 MG TABS (ATORVASTATIN CALCIUM) 1/2 tablet a day OMEPRAZOLE 20 MG CPDR (OMEPRAZOLE) Take 1 tablet by mouth once a day GLYBURIDE-METFORMIN 5-500 MG TABS (GLYBURIDE-METFORMIN) Take 2  tablets by mouth in the am and 1 tablet by mouth in the pm PRISTIQ 100 MG XR24H-TAB (DESVENLAFAXINE SUCCINATE) one tab by mouth once daily HYDROCHLOROTHIAZIDE 12.5 MG CAPS (HYDROCHLOROTHIAZIDE) 1 cap by mouth once daily FLUTICASONE PROPIONATE 50 MCG/ACT SUSP (FLUTICASONE PROPIONATE) 1 spray each  nostril twice daily LORTAB 5 5-500 MG TABS (HYDROCODONE-ACETAMINOPHEN) one by mouth two times a day prn VENTOLIN HFA 108 (90 BASE) MCG/ACT AERS (ALBUTEROL SULFATE) 1-2 puffs every 3-4 hours as needed shortness of breath  Current Allergies (reviewed today): CLARITIN  Past Medical History:    Reviewed history from 08/19/2008 and no changes required:       PROTEINURIA (ICD-791.0) - sees urology       HYPERTENSION (ICD-401.9)       BACK PAIN, CHRONIC (ICD-724.5) - sees ortho and they write for pain meds       INSOMNIA (ICD-780.52)       GERD (ICD-530.81)       HYPERLIPIDEMIA (ICD-272.4)       OSTEOARTHRITIS (ICD-715.90)       NEPHROLITHIASIS, HX OF (ICD-V13.01) - sees urology       DIABETES MELLITUS, TYPE II (ICD-250.00)       DEPRESSION (ICD-311)       Peripheral vascular disease        reactive airway disease with recurrent bronchitis and h/o pneumonia  Past Surgical History:    Reviewed history from 08/03/2008 and no changes required:       Carpal tunnel release       cardiac ablation  Lumbar laminectomy       Lumbar fusion      Review of Systems       no nausea / vomiting / diarrhea / fever / chills / chest pain / SOB    Physical Exam  General:     Well-developed, well nourished, well hydrated morbidly obese female  in no acute distress; appropriate and cooperative Head:     Normocephalic and atraumatic without obvious abnormalities.  Eyes:     No corneal or conjunctival inflammation.ision grossly normal. Ears:     External ear shows no significant lesions or deformities. Hearing is grossly normal. Nose:     External nasal examination shows no deformity or inflammation.  Neck:     supple, full ROM Lungs:     Normal respiratory effort, chest expands symmetrically with good air flow noted. Lungs clear to auscultation with no crackles, rales or wheezes.   Heart:     Normal rate and  rhythm. S1 and S2 normal, without gallop, murmur, click, rub  Extremities:      1+ edema on the right and trace on left - patient's  baseline.  stasis changes present.   Neurologic:     alert & oriented X3,  no deficit in strength noted, no balance problems noted, neuro is grossly intact Skin:     Intact without suspicious lesions or rashes Psych:     Cognition and judgment appear intact. Alert and cooperative with normal attention span and concentration. No apparent delusions, illusions, hallucinations, normally interactive, good eye contact, not anxious or depressed appearing, and not agitated.       Impression & Recommendations:  Problem # 1:  DIABETES MELLITUS, TYPE II (ICD-250.00) continue current meds, check labs. Her updated medication list for this problem includes:    Altace 2.5 Mg Caps (Ramipril) .Marland Kitchen... Take 1 tablet by mouth once a day    Bayer Aspirin Ec Low Dose 81 Mg Tbec (Aspirin) .Marland Kitchen... Take 1 tablet by mouth once a day    Actos 45 Mg Tabs (Pioglitazone hcl) .Marland Kitchen... Take 1 tablet by mouth once a day    Glyburide-metformin 5-500 Mg Tabs (Glyburide-metformin) .Marland Kitchen... Take 2  tablets by mouth in the am and 1 tablet by mouth in the pm  Orders: TLB-A1C / Hgb A1C (Glycohemoglobin) (83036-A1C) TLB-CMP (Comprehensive Metabolic Pnl) (80053-COMP) Nephrology Referral (Nephro)   Problem # 2:  DEPRESSION (ICD-311) Increase pristiq and follow up in one month.  patient to take pristiq in the am and amitriptyline in pm - her pain management doc has her on amitriptyline for chronic pain and insomnia.  reviewed potential interaction.  patient has been doing well on both meds.  Patient to call if symptoms increase / change / persist or any concerns. Her updated medication list for this problem includes:    Amitriptyline Hcl 25 Mg Tabs (Amitriptyline hcl) .Marland Kitchen... 1 to 2 tabs daily by mouth at bedtime    Pristiq 100 Mg Xr24h-tab (Desvenlafaxine succinate) ..... One tab by mouth once daily   Problem # 3:  HYPERTENSION (ICD-401.9) continue current meds.  check labs. Her  updated medication list for this problem includes:    Altace 2.5 Mg Caps (Ramipril) .Marland Kitchen... Take 1 tablet by mouth once a day    Hydrochlorothiazide 12.5 Mg Caps (Hydrochlorothiazide) .Marland Kitchen... 1 cap by mouth once daily  Orders: TLB-CMP (Comprehensive Metabolic Pnl) (80053-COMP)   Problem # 4:  PROTEINURIA (ICD-791.0) will refer to nephrology Orders: Nephrology Referral (Nephro)   Problem # 5:  HYPERLIPIDEMIA (ICD-272.4) coninue current meds - labs UTD Her updated medication list for this problem includes:    Lipitor 10 Mg Tabs (Atorvastatin calcium) .Marland Kitchen... 1/2 tablet a day   Complete Medication List: 1)  Altace 2.5 Mg Caps (Ramipril) .... Take 1 tablet by mouth once a day 2)  Bayer Aspirin Ec Low Dose 81 Mg Tbec (Aspirin) .... Take 1 tablet by mouth once a day 3)  Actos 45 Mg Tabs (Pioglitazone hcl) .... Take 1 tablet by mouth once a day 4)  Amitriptyline Hcl 25 Mg Tabs (Amitriptyline hcl) .Marland Kitchen.. 1 to 2 tabs daily by mouth at bedtime 5)  Lipitor 10 Mg Tabs (Atorvastatin calcium) .... 1/2 tablet a day 6)  Omeprazole 20 Mg Cpdr (Omeprazole) .... Take 1 tablet by mouth once a day 7)  Glyburide-metformin 5-500 Mg Tabs (Glyburide-metformin) .... Take 2  tablets by mouth in the am and 1 tablet by mouth in the pm 8)  Pristiq 100 Mg Xr24h-tab (Desvenlafaxine succinate) .... One tab by mouth once daily 9)  Hydrochlorothiazide 12.5 Mg Caps (Hydrochlorothiazide) .Marland Kitchen.. 1 cap by mouth once daily 10)  Fluticasone Propionate 50 Mcg/act Susp (Fluticasone propionate) .Marland Kitchen.. 1 spray each nostril twice daily 11)  Lortab 5 5-500 Mg Tabs (Hydrocodone-acetaminophen) .... One by mouth two times a day prn 12)  Ventolin Hfa 108 (90 Base) Mcg/act Aers (Albuterol sulfate) .Marland Kitchen.. 1-2 puffs every 3-4 hours as needed shortness of breath   Patient Instructions: 1)  Please schedule a follow-up appointment in 1 month.   Prescriptions: PRISTIQ 100 MG XR24H-TAB (DESVENLAFAXINE SUCCINATE) one tab by mouth once daily  #30 x 1    Entered and Authorized by:   Paulo Fruit MD   Signed by:   Paulo Fruit MD on 09/23/2008   Method used:   Electronically to        [facility] [address] #[id number]* (retail)       [address] [place], [place]  [address]       Ph: [phone]       Fax: [phone]   RxID:   [id number] FLUTICASONE PROPIONATE 50 MCG/ACT SUSP (FLUTICASONE PROPIONATE) 1 spray each nostril twice daily  #2 x 3   Entered and Authorized by:   Paulo Fruit MD   Signed by:   Paulo Fruit MD on 09/23/2008   Method used:   Electronically to        [facility] [address] #[id number]* (retail)       [address] [place], [place]  [address]       Ph: [phone]       Fax: [phone]   RxID:   [id number]  ]

## 2010-11-22 NOTE — Progress Notes (Signed)
Summary: NEED REFERRAL  Phone Note Call from Patient Call back at Work Phone [phone]   Caller: Patient Summary of Call: PT CALLED LEFT MSG SHE CANNOT GET IN WITH DR Riley Kill, IT HAS BEEN 7 YEARS WILL NEED AN REFERRAL, AND WAS TOLD HE IS NOT TAKIN NEW PT, PT SAYS SHE REALLY WOULD LIKE TO SEE HIM IF POSSIBLE, IF NOT OK.  Initial call taken by: Kandice Hams,  November 25, 2009 5:33 PM  Follow-up for Phone Call        done. Follow-up by: Seymour Bars DO,  November 26, 2009 11:55 AM     Appended Document: NEED REFERRAL pt informed referral sent to referral coordinater

## 2010-11-22 NOTE — Letter (Signed)
Summary: [facility]  [facility]   Imported By: Lanelle Bal 09/01/2008 09:21:30  _____________________________________________________________________  External Attachment:    Type:   Image     Comment:   External Document

## 2010-11-22 NOTE — Assessment & Plan Note (Signed)
Summary: 2 weeks rov-ch   Vital Signs:  Patient Profile:   55 Years Old Female Height:     67 inches Weight:      281.50 pounds Temp:     98.4 degrees F oral Pulse rate:   76 / minute Pulse rhythm:   regular Resp:     16 per minute BP sitting:   130 / 60  (right arm) Cuff size:   large  Vitals Entered By: Windell Norfolk (September 01, 2008 11:21 AM)                 Visit Type:  follow up PCP:  Paulo Fruit MD  Chief Complaint:  2 wk ROV.  History of Present Illness: Patient was seen 2 weeks ago for c/o sore throat that was found to be related to PND and uncontrolled allergy symptoms.  She was started on flonase with excellent results.  All symptoms well controlled at current time.  Also, she had some SOB at that appt and it was found that she no longer had her rescue inhaler for a h/o reactive airways and recurrent bronchitis and pneumonia - she is a past smoker.  Her MDI was refilled and she only used it for a few days until her allergy symptoms were better controlled - has not needed it since.  Also, at that appt, we added altace back in for her BP control and kidney protection as a diabetic.  Her BP at home have been well controlled, now.  As far as her diabetes, her BGM are running 100 - 130 and improving since things have settled down.  She says she feels much better than last appt.  GERD symptoms are well controlled with current medication.    Prior Medication List:  ALTACE 2.5 MG CAPS (RAMIPRIL) Take 1 tablet by mouth once a day BAYER ASPIRIN EC LOW DOSE 81 MG TBEC (ASPIRIN) Take 1 tablet by mouth once a day ACTOS 45 MG TABS (PIOGLITAZONE HCL) Take 1 tablet by mouth once a day AMITRIPTYLINE HCL 25 MG TABS (AMITRIPTYLINE HCL) 1 to 2 tabs daily by mouth at bedtime LIPITOR 10 MG TABS (ATORVASTATIN CALCIUM) 1/2 tablet a day OMEPRAZOLE 20 MG CPDR (OMEPRAZOLE) Take 1 tablet by mouth once a day GLYBURIDE-METFORMIN 5-500 MG TABS (GLYBURIDE-METFORMIN) Take 2  tablets by  mouth in the am and 1 tablet by mouth in the pm PRISTIQ 50 MG XR24H-TAB (DESVENLAFAXINE SUCCINATE) Take 1 tablet by mouth once a day HYDROCHLOROTHIAZIDE 12.5 MG CAPS (HYDROCHLOROTHIAZIDE) 1 cap by mouth once daily FLUTICASONE PROPIONATE 50 MCG/ACT SUSP (FLUTICASONE PROPIONATE) 1 spray each nostril once daily LORTAB 5 5-500 MG TABS (HYDROCODONE-ACETAMINOPHEN) one by mouth two times a day prn VENTOLIN HFA 108 (90 BASE) MCG/ACT AERS (ALBUTEROL SULFATE) 1-2 puffs every 3-4 hours as needed shortness of breath   Current Allergies (reviewed today): CLARITIN  Past Medical History:    Reviewed history from 08/19/2008 and no changes required:       PROTEINURIA (ICD-791.0) - sees urology       HYPERTENSION (ICD-401.9)       BACK PAIN, CHRONIC (ICD-724.5) - sees ortho and they write for pain meds       INSOMNIA (ICD-780.52)       GERD (ICD-530.81)       HYPERLIPIDEMIA (ICD-272.4)       OSTEOARTHRITIS (ICD-715.90)       NEPHROLITHIASIS, HX OF (ICD-V13.01) - sees urology       DIABETES MELLITUS, TYPE II (ICD-250.00)  DEPRESSION (ICD-311)       Peripheral vascular disease        reactive airway disease with recurrent bronchitis and h/o pneumonia  Past Surgical History:    Reviewed history from 08/03/2008 and no changes required:       Carpal tunnel release       cardiac ablation       Lumbar laminectomy       Lumbar fusion     Risk Factors: Tobacco use:  quit    Year quit:  1987      Pack-years:  18 years  1/2 pack daily Passive smoke exposure:  no Drug use:  no HIV high-risk behavior:  no Caffeine use:  2 drinks per day Alcohol use:  no Exercise:  no Seatbelt use:  100 % Sun Exposure:  occasionally  Family History Risk Factors:    Family History of MI in females < 34 years old:  no    Family History of MI in males < 63 years old:  no  Colonoscopy History:    Date of Last Colonoscopy:  06/22/2007  Mammogram History:    Date of Last Mammogram:  10/23/2007  PAP Smear  History:    Date of Last PAP Smear:  06/22/2007   Review of Systems       no nausea / vomiting / diarrhea / fever / chills / chest pain / SOB    Physical Exam  General:     Well-developed, well nourished, well hydrated morbidly obese female  in no acute distress; appropriate and cooperative Head:     Normocephalic and atraumatic without obvious abnormalities.  Eyes:     No corneal or conjunctival inflammation.ision grossly normal. Ears:     External ear shows no significant lesions or deformities. Hearing is grossly normal. Nose:     External nasal examination shows no deformity or inflammation.  Neck:     supple, full ROM Lungs:     Normal respiratory effort, chest expands symmetrically with good air flow noted. Lungs clear to auscultation with no crackles, rales or wheezes.   Heart:     Normal rate and  rhythm. S1 and S2 normal, without gallop, murmur, click, rub  Extremities:     1+ edema on the right and trace on left - patient's  baseline.  stasis changes present.   Neurologic:     alert & oriented X3,  no deficit in strength noted, no balance problems noted, neuro is grossly intact Skin:     Intact without suspicious lesions or rashes Psych:     Cognition and judgment appear intact. Alert and cooperative with normal attention span and concentration. No apparent delusions, illusions, hallucinations, normally interactive, good eye contact, not anxious or depressed appearing, and not agitated.       Impression & Recommendations:  Problem # 1:  DIABETES MELLITUS, TYPE II (ICD-250.00) continue current meds. Her updated medication list for this problem includes:    Altace 2.5 Mg Caps (Ramipril) .Marland Kitchen... Take 1 tablet by mouth once a day    Bayer Aspirin Ec Low Dose 81 Mg Tbec (Aspirin) .Marland Kitchen... Take 1 tablet by mouth once a day    Actos 45 Mg Tabs (Pioglitazone hcl) .Marland Kitchen... Take 1 tablet by mouth once a day    Glyburide-metformin 5-500 Mg Tabs (Glyburide-metformin) .Marland Kitchen... Take 2   tablets by mouth in the am and 1 tablet by mouth in the pm   Problem # 2:  HYPERTENSION (ICD-401.9) continue current meds Her  updated medication list for this problem includes:    Altace 2.5 Mg Caps (Ramipril) .Marland Kitchen... Take 1 tablet by mouth once a day    Hydrochlorothiazide 12.5 Mg Caps (Hydrochlorothiazide) .Marland Kitchen... 1 cap by mouth once daily   Problem # 3:  REACTIVE AIRWAY DISEASE (ICD-493.90) patient uses rarely - only with URI's and with flare of allergy symptoms  Her updated medication list for this problem includes:    Ventolin Hfa 108 (90 Base) Mcg/act Aers (Albuterol sulfate) .Marland Kitchen... 1-2 puffs every 3-4 hours as needed shortness of breath   Problem # 4:  ALLERGIC RHINITIS (ICD-477.9) continue current meds Her updated medication list for this problem includes:    Fluticasone Propionate 50 Mcg/act Susp (Fluticasone propionate) .Marland Kitchen... 1 spray each nostril once daily   Problem # 5:  GERD (ICD-530.81) continue current meds Her updated medication list for this problem includes:    Omeprazole 20 Mg Cpdr (Omeprazole) .Marland Kitchen... Take 1 tablet by mouth once a day   Complete Medication List: 1)  Altace 2.5 Mg Caps (Ramipril) .... Take 1 tablet by mouth once a day 2)  Bayer Aspirin Ec Low Dose 81 Mg Tbec (Aspirin) .... Take 1 tablet by mouth once a day 3)  Actos 45 Mg Tabs (Pioglitazone hcl) .... Take 1 tablet by mouth once a day 4)  Amitriptyline Hcl 25 Mg Tabs (Amitriptyline hcl) .Marland Kitchen.. 1 to 2 tabs daily by mouth at bedtime 5)  Lipitor 10 Mg Tabs (Atorvastatin calcium) .... 1/2 tablet a day 6)  Omeprazole 20 Mg Cpdr (Omeprazole) .... Take 1 tablet by mouth once a day 7)  Glyburide-metformin 5-500 Mg Tabs (Glyburide-metformin) .... Take 2  tablets by mouth in the am and 1 tablet by mouth in the pm 8)  Pristiq 50 Mg Xr24h-tab (Desvenlafaxine succinate) .... Take 1 tablet by mouth once a day 9)  Hydrochlorothiazide 12.5 Mg Caps (Hydrochlorothiazide) .Marland Kitchen.. 1 cap by mouth once daily 10)  Fluticasone  Propionate 50 Mcg/act Susp (Fluticasone propionate) .Marland Kitchen.. 1 spray each nostril once daily 11)  Lortab 5 5-500 Mg Tabs (Hydrocodone-acetaminophen) .... One by mouth two times a day prn 12)  Ventolin Hfa 108 (90 Base) Mcg/act Aers (Albuterol sulfate) .Marland Kitchen.. 1-2 puffs every 3-4 hours as needed shortness of breath   Patient Instructions: 1)  Please schedule a follow-up appointment in 2-4 weeks.   ]

## 2010-11-22 NOTE — Assessment & Plan Note (Signed)
Summary: f/u diabetes   Vital Signs:  Patient profile:   55 year old female Height:      67 inches Weight:      280 pounds BMI:     44.01 O2 Sat:      96 % on Room air Temp:     97.3 degrees F oral Pulse rate:   76 / minute BP sitting:   149 / 83  (left arm) Cuff size:   large  Vitals Entered By: Payton Spark CMA (August 24, 2009 8:46 AM)  O2 Flow:  Room air  CC: FU on DM   Primary Care Provider:  Seymour Bars DO  CC:  FU on DM.  History of Present Illness: 55 yo WF presents for f/u T2DM.  Her AM fastings are running 110-140.  She is taking Actos 45 mg once a day and Glyburide/ Metformin twice a day.  She feels low at 85.  Her A1C went from 6.6 to 7.3.   She has not been able to exercise much due to family illness.  Has not changed her diet.  Did not take meds meds today.    She is due for labs.  Her monofilment, urine micro and flu shots are UTD.   She saw Dr Noel Gerold back for her LBP s/p surgery and is now on Gabapentin and Robaxin.  Her back is healing up nicely.  she is still on Amitriptyline for her back, started a few years ago.  she's also on Pristiq for depression.  Allergies: 1)  Claritin  Past History:  Past Medical History: Reviewed history from 08/19/2008 and no changes required. PROTEINURIA (ICD-791.0) - sees urology HYPERTENSION (ICD-401.9) BACK PAIN, CHRONIC (ICD-724.5) - sees ortho and they write for pain meds INSOMNIA (ICD-780.52) GERD (ICD-530.81) HYPERLIPIDEMIA (ICD-272.4) OSTEOARTHRITIS (ICD-715.90) NEPHROLITHIASIS, HX OF (ICD-V13.01) - sees urology DIABETES MELLITUS, TYPE II (ICD-250.00) DEPRESSION (ICD-311) Peripheral vascular disease  reactive airway disease with recurrent bronchitis and h/o pneumonia  Past Surgical History: Reviewed history from 08/03/2008 and no changes required. Carpal tunnel release cardiac ablation Lumbar laminectomy Lumbar fusion   Social History: Reviewed history from 08/03/2008 and no changes required. 2  children (one biologic -in their 49's and one adopted who is 55 yo) Occupation:disabled Married Former smoker  Review of Systems      See HPI  Physical Exam  General:  morbidly obese WF in NAD Head:  normocephalic and atraumatic.   Eyes:  pupils equal, pupils round, and pupils reactive to light.  wears glasses Neck:  no masses.   Lungs:  Normal respiratory effort, chest expands symmetrically. Lungs are clear to auscultation, no crackles or wheezes. Heart:  Normal rate and regular rhythm. S1 and S2 normal without gallop, murmur, click, rub or other extra sounds. Extremities:  stasis dermatitis with 1+ pitting LE edema Skin:  color normal.   Cervical Nodes:  No lymphadenopathy noted Psych:  good eye contact, not anxious appearing, and not depressed appearing.    Diabetes Management Exam:    Foot Exam (with socks and/or shoes not present):       Sensory-Pinprick/Light touch:          Left medial foot (L-4): diminished          Left dorsal foot (L-5): diminished          Left lateral foot (S-1): diminished          Right medial foot (L-4): diminished          Right dorsal foot (  L-5): diminished          Right lateral foot (S-1): diminished       Sensory-Monofilament:          Left foot: diminished          Right foot: diminished    Eye Exam:       Eye Exam done elsewhere          Date: 06/16/2009          Results: normal          Done by:  Kitchens   Impression & Recommendations:  Problem # 1:  DIABETES MELLITUS, TYPE II (ICD-250.00) A1C rose from 6.6 to 7.3 likely due to lifestyle issues.  No changes made to her diabetic meds.  Umicro updated in July.  Flu shot done in Oct.  Eye exam done in Aug.  RTC 3 mos.   Her updated medication list for this problem includes:    Altace 2.5 Mg Caps (Ramipril) .Marland Kitchen... Take 1 tablet by mouth once a day    Bayer Aspirin Ec Low Dose 81 Mg Tbec (Aspirin) .Marland Kitchen... Take 1 tablet by mouth once a day    Actos 45 Mg Tabs (Pioglitazone hcl) .Marland Kitchen... Take  1 tablet by mouth once a day    Glyburide-metformin 5-500 Mg Tabs (Glyburide-metformin) .Marland Kitchen... Take 2  tablets by mouth in the am and 1 tablet by mouth in the pm  Orders: T-Comprehensive Metabolic Panel ([id number]) Fingerstick (36416) Hemoglobin A1C (09811)  Problem # 2:  BACK PAIN, CHRONIC (ICD-724.5) Just saw Dr Noel Gerold.  Now that she's on Gabapentin, will stop the Amitriptyline at bedtime since it can interact w/ her Pristiq to cause serotonin syndrome.   Her updated medication list for this problem includes:    Bayer Aspirin Ec Low Dose 81 Mg Tbec (Aspirin) .Marland Kitchen... Take 1 tablet by mouth once a day    Lortab 5 5-500 Mg Tabs (Hydrocodone-acetaminophen) ..... One by mouth two times a day prn    Methocarbamol 500 Mg Tabs (Methocarbamol) .Marland Kitchen... 1 tab by mouth at bedtime as needed back pain  Problem # 3:  HYPERTENSION (ICD-401.9) High today but did not take her meds.   Her updated medication list for this problem includes:    Altace 2.5 Mg Caps (Ramipril) .Marland Kitchen... Take 1 tablet by mouth once a day    Hydrochlorothiazide 12.5 Mg Caps (Hydrochlorothiazide) .Marland Kitchen... 1 cap by mouth once daily  BP today: 149/83 Prior BP: 130/78 (05/18/2009)  Labs Reviewed: K+: 3.9 (01/25/2009) Creat: : 0.7 (01/25/2009)   Chol: 119 (06/18/2008)   HDL: 28.4 (06/18/2008)   LDL: 68 (06/18/2008)   TG: 115 (06/18/2008)  Problem # 4:  WEIGHT GAIN (ICD-783.1) Check TSH with her labs.  We discussed the importance of exercise, healthy diet and wt loss.  Her BMI is > 40 c/w Class III obesity. Orders: T-TSH ([id number])  Complete Medication List: 1)  Altace 2.5 Mg Caps (Ramipril) .... Take 1 tablet by mouth once a day 2)  Bayer Aspirin Ec Low Dose 81 Mg Tbec (Aspirin) .... Take 1 tablet by mouth once a day 3)  Actos 45 Mg Tabs (Pioglitazone hcl) .... Take 1 tablet by mouth once a day 4)  Lipitor 10 Mg Tabs (Atorvastatin calcium) .... 1/2 tablet a day 5)  Omeprazole 20 Mg Cpdr (Omeprazole) .... Take 1 tablet by mouth once  a day 6)  Glyburide-metformin 5-500 Mg Tabs (Glyburide-metformin) .... Take 2  tablets by mouth in the am and 1 tablet  by mouth in the pm 7)  Pristiq 100 Mg Xr24h-tab (Desvenlafaxine succinate) .... One tab by mouth once daily 8)  Hydrochlorothiazide 12.5 Mg Caps (Hydrochlorothiazide) .Marland Kitchen.. 1 cap by mouth once daily 9)  Fluticasone Propionate 50 Mcg/act Susp (Fluticasone propionate) .Marland Kitchen.. 1 spray each nostril twice daily 10)  Lortab 5 5-500 Mg Tabs (Hydrocodone-acetaminophen) .... One by mouth two times a day prn 11)  Ventolin Hfa 108 (90 Base) Mcg/act Aers (Albuterol sulfate) .Marland Kitchen.. 1-2 puffs every 3-4 hours as needed shortness of breath 12)  Gabapentin 300 Mg Caps (Gabapentin) .... 2 capsules by mouth qhs 13)  Methocarbamol 500 Mg Tabs (Methocarbamol) .Marland Kitchen.. 1 tab by mouth at bedtime as needed back pain  Other Orders: T-Lipid Profile ([id number])  Patient Instructions: 1)  A1C 7.3  (7.0 is goal). 2)  Stop Amitriptyline. 3)  Work on diet, exercise, wt loss. 4)  Update labs today.   5)  Will call you w/ results tomorrow. 6)  Return for f/u diabetes/ BP in 3 mos.  Laboratory Results   Blood Tests     HGBA1C: 7.3%   (Normal Range: Non-Diabetic - 3-6%   Control Diabetic - 6-8%)

## 2010-11-22 NOTE — Assessment & Plan Note (Signed)
Summary: f/u DM   Vital Signs:  Patient profile:   55 year old female Height:      67 inches Weight:      263 pounds BMI:     41.34 O2 Sat:      97 % on Room air Pulse rate:   59 / minute BP sitting:   91 / 56  (left arm) Cuff size:   large  Vitals Entered By: Payton Spark CMA (June 02, 2010 9:18 AM)  O2 Flow:  Room air CC: F/U DM.   Primary Care Provider:  Seymour Bars DO  CC:  F/U DM.Theresa Scott  History of Present Illness: 55 yo WF presents for f/u T2DM.  She is preparing to have gastric bypass with Dr Ezzard Standing.  She has to go for PFTs and a psych appt then an EKG and a DEXA study.  She has been cleared by Dr Shirlee Latch after having a normal cardiac cath in April.  Her A1C looks great at 6.3  and her urine micro is normal today.  Her BP has been running low.   Her diabetic eye exam is UTD. Denies paresthesias, polyuria or blurry vision. Denies CP or DOE.      Current Medications (verified): 1)  Altace 2.5 Mg Caps (Ramipril) .... Once Daily 2)  Bayer Aspirin Ec Low Dose 81 Mg Tbec (Aspirin) .... Take 1 Tablet By Mouth Once A Day 3)  Actos 45 Mg Tabs (Pioglitazone Hcl) .... Take 1 Tablet By Mouth Once A Day 4)  Lipitor 10 Mg Tabs (Atorvastatin Calcium) .... 1/2 Tablet A Day 5)  Omeprazole 20 Mg Cpdr (Omeprazole) .... Take 1 Tablet By Mouth Once A Day 6)  Metformin Hcl 1000 Mg Tabs (Metformin Hcl) .Theresa Scott.. 1 Tab By Mouth Bid 7)  Pristiq 100 Mg Xr24h-Tab (Desvenlafaxine Succinate) .... One Tab By Mouth Once Daily 8)  Hydrochlorothiazide 12.5 Mg Caps (Hydrochlorothiazide) .Theresa Scott.. 1 Cap By Mouth Once Daily 9)  Lortab 5 5-500 Mg Tabs (Hydrocodone-Acetaminophen) .... One By Mouth Two Times A Day Prn 10)  Ventolin Hfa 108 (90 Base) Mcg/act Aers (Albuterol Sulfate) .Theresa Scott.. 1-2 Puffs Every 3-4 Hours As Needed Shortness of Breath 11)  Multivitamins   Tabs (Multiple Vitamin) .... Once Daily 12)  Pen Needles 5/16" 31g X 8 Mm Misc (Insulin Pen Needle) .... Use Once Daily As Directed 13)  Amitriptyline  Hcl 25 Mg Tabs (Amitriptyline Hcl) .... Once Daily 14)  Metoprolol Tartrate 25 Mg Tabs (Metoprolol Tartrate) .... Take One Tablet By Mouth Twice A Day  Allergies (verified): 1)  Claritin  Past History:  Past Medical History: Reviewed history from 03/09/2010 and no changes required. 1. HYPERTENSION (ICD-401.9) 2. BACK PAIN, CHRONIC (ICD-724.5) - Sees ortho and they write for pain meds. History of posterior spinal fusion.  3. INSOMNIA (ICD-780.52) 4. GERD (ICD-530.81) 5. HYPERLIPIDEMIA (ICD-272.4) 6. OSTEOARTHRITIS (ICD-715.90) 7. NEPHROLITHIASIS, HX OF (ICD-V13.01) - sees urology 8. DIABETES MELLITUS, TYPE II (ICD-250.00) 9. DEPRESSION (ICD-311) 10. Varicose veins 11. 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  Event monitor in 2/11 done for palpitations showed only PACs.  13. Left heart cath in 5/08: No angiographic CAD, EF 60%.  13. Obesity 14. Chest pain: Lexiscan myoview (4/11) with EF 58%, normal wall motion, small mild reversible mid anterior defect thought to possibly be due to shifting breast attenuation.  Low risk study.  Patient continued to have ches pain so underwent LHC (4/11) showing no angiographic CAD and EF 60%.  Social History: Reviewed history from 12/01/2009 and no changes required. 2 children (one biologic -in their 71's and one adopted who is 55 yo) Occupation:disabled Married Former smoker  Review of Systems      See HPI  Physical Exam  General:  alert, well-developed, well-nourished, and well-hydrated.  morbidly obese Head:  normocephalic and atraumatic.   Eyes:  pupils equal, pupils round, and pupils reactive to light.   Mouth:  pharynx pink and moist.   Neck:  no masses.   Lungs:  Normal respiratory effort, chest expands symmetrically. Lungs are clear to auscultation, no crackles or wheezes. Heart:  Normal rate and regular rhythm. S1 and S2 normal without gallop, murmur, click, rub or other extra  sounds. Pulses:  2+ radial and pedal pulses Extremities:  1+ bilat LE pitting edema Skin:  color normal.   Cervical Nodes:  No lymphadenopathy noted Psych:  good eye contact, not anxious appearing, and not depressed appearing.     Impression & Recommendations:  Problem # 1:  DIABETES MELLITUS, TYPE II (ICD-250.00) A1C looks great at 6.3.  She is to continue Metformin + Actos for now.  She will probably be able to cut back to Metformin (even at a lower dose) just prior to gastric bypass surgery which I am guessing will be scheduled around the end of Sept.  She is UTD on everything else.  Umicroalbumin today.  Monofilatment and eye exam are UTD.   The following medications were removed from the medication list:    Victoza 18 Mg/78ml Soln (Liraglutide) .Theresa Scott... 1.8 mg [place] in the evening Her updated medication list for this problem includes:    Altace 2.5 Mg Caps (Ramipril) ..... Once daily    Bayer Aspirin Ec Low Dose 81 Mg Tbec (Aspirin) .Theresa Scott... Take 1 tablet by mouth once a day    Actos 45 Mg Tabs (Pioglitazone hcl) .Theresa Scott... Take 1 tablet by mouth once a day    Metformin Hcl 1000 Mg Tabs (Metformin hcl) .Theresa Scott... 1 tab by mouth bid  Orders: Fingerstick (16109) Urine Microalbumin (60454) Hgb A1C (09811BJ)  Labs Reviewed: Creat: 0.7 (02/09/2010)   Microalbumin: 30 (06/02/2010)  Last Eye Exam: normal (06/16/2009) Reviewed HgBA1c results: 6.3 (06/02/2010)  6.4 (03/01/2010)  Problem # 2:  HYPERTENSION (ICD-401.9) BP too low.  Will cut out HCTZ and continue on Altace + Metoprolol.   The following medications were removed from the medication list:    Hydrochlorothiazide 12.5 Mg Caps (Hydrochlorothiazide) .Theresa Scott... 1 cap by mouth once daily Her updated medication list for this problem includes:    Altace 2.5 Mg Caps (Ramipril) ..... Once daily    Metoprolol Tartrate 25 Mg Tabs (Metoprolol tartrate) .Theresa Scott... Take one tablet by mouth twice a day  BP today: 91/56 Prior BP: 106/62 (03/09/2010)  Labs  Reviewed: K+: 3.7 (02/09/2010) Creat: : 0.7 (02/09/2010)   Chol: 139 (08/24/2009)   HDL: 41 (08/24/2009)   LDL: 75 (08/24/2009)   TG: 114 (08/24/2009)  Problem # 3:  OBESITY (ICD-278.00) BMI 41 c/w class III obesity with co-morbidities of T2DM, GERD, HTN, Dyslipidemia, chronic back pain and depression.  I fully support her decision to have gastric bypass.  She is excited and in fair health to proceed.   Ht: 67 (06/02/2010)   Wt: 263 (06/02/2010)   BMI: 41.34 (06/02/2010)  Problem # 4:  HYPERLIPIDEMIA (ICD-272.4) She is on Lipitor. FLP done 08-2009.   Her updated medication list for this problem includes:    Lipitor 10 Mg Tabs (Atorvastatin calcium) .Theresa Scott... 1/2  tablet a day  Labs Reviewed: SGOT: 20 (02/01/2010)   SGPT: 19 (02/01/2010)   HDL:41 (08/24/2009), 28.4 (06/18/2008)  LDL:75 (08/24/2009), 68 (06/18/2008)  Chol:139 (08/24/2009), 119 (06/18/2008)  Trig:114 (08/24/2009), 115 (06/18/2008)  Complete Medication List: 1)  Altace 2.5 Mg Caps (Ramipril) .... Once daily 2)  Bayer Aspirin Ec Low Dose 81 Mg Tbec (Aspirin) .... Take 1 tablet by mouth once a day 3)  Actos 45 Mg Tabs (Pioglitazone hcl) .... Take 1 tablet by mouth once a day 4)  Lipitor 10 Mg Tabs (Atorvastatin calcium) .... 1/2 tablet a day 5)  Omeprazole 20 Mg Cpdr (Omeprazole) .... Take 1 tablet by mouth once a day 6)  Metformin Hcl 1000 Mg Tabs (Metformin hcl) .Theresa Scott.. 1 tab by mouth bid 7)  Pristiq 100 Mg Xr24h-tab (Desvenlafaxine succinate) .... One tab by mouth once daily 8)  Lortab 5 5-500 Mg Tabs (Hydrocodone-acetaminophen) .... One by mouth two times a day prn 9)  Ventolin Hfa 108 (90 Base) Mcg/act Aers (Albuterol sulfate) .Theresa Scott.. 1-2 puffs every 3-4 hours as needed shortness of breath 10)  Multivitamins Tabs (Multiple vitamin) .... Once daily 11)  Pen Needles 5/16" 31g X 8 Mm Misc (Insulin pen needle) .... Use once daily as directed 12)  Amitriptyline Hcl 25 Mg Tabs (Amitriptyline hcl) .... Once daily 13)  Metoprolol Tartrate  25 Mg Tabs (Metoprolol tartrate) .... Take one tablet by mouth twice a day  Patient Instructions: 1)  A1C looks great at 6.3. 2)  Stop HCTZ due to low BPs. 3)  Urine microalbumin  is negative today. 4)  Call and let me know if you need anything.   5)  Return for follow up in 3-4 mos.  Laboratory Results   Urine Tests    Microalbumin (urine): 30 mg/L Creatinine: 200mg /dL  A:C Ratio <16  Blood Tests     HGBA1C: 6.3%   (Normal Range: Non-Diabetic - 3-6%   Control Diabetic - 6-8%)

## 2010-11-22 NOTE — Letter (Signed)
Summary: [facility]   Imported By: Lanelle Bal 09/09/2010 08:01:45  _____________________________________________________________________  External Attachment:    Type:   Image     Comment:   External Document

## 2010-11-22 NOTE — Miscellaneous (Signed)
Summary: ECHO  Clinical Lists Changes  Observations: Added new observation of ECHOINTERP:  Study Conclusions    - Left ventricle: The cavity size was normal. Wall thickness was     normal. Systolic function was normal. The estimated ejection     fraction was in the range of 55% to 65%. Wall motion was normal;     there were no regional wall motion abnormalities. Left ventricular     diastolic function parameters were normal.   - Aortic valve: Mildly calcified annulus. Trileaflet.   - Mitral valve: Mildly calcified annulus. Mildly thickened leaflets     .   - Atrial septum: No defect or patent foramen ovale was identified.   - Tricuspid valve: Mild regurgitation. -------------------------------------------------------------------------------    Prepared and Electronically Authenticated by    Nicki Guadalajara, MD [facility]   2011-04-05T16:34:41.737  (01/25/2010 14:37) Added new observation of ECHOINTERP:  Left ventricle: The cavity size was normal. Wall thickness was   increased in a pattern of mild LVH. The estimated ejection fraction   was 60%. Wall motion was normal; there were no regional wall motion   abnormalities.   ----------------------------------------------------------------------------------    Prepared and Electronically Authenticated by    Willa Rough, MD, [facility]   2011-02-23T16:06:18.477 (12/15/2009 14:39)      Echocardiogram  Procedure date:  01/25/2010  Findings:       Study Conclusions    - Left ventricle: The cavity size was normal. Wall thickness was     normal. Systolic function was normal. The estimated ejection     fraction was in the range of 55% to 65%. Wall motion was normal;     there were no regional wall motion abnormalities. Left ventricular     diastolic function parameters were normal.   - Aortic valve: Mildly calcified annulus. Trileaflet.   - Mitral valve: Mildly calcified annulus. Mildly thickened leaflets     .   - Atrial septum: No  defect or patent foramen ovale was identified.   - Tricuspid valve: Mild regurgitation. -------------------------------------------------------------------------------    Prepared and Electronically Authenticated by    Nicki Guadalajara, MD [facility]   2011-04-05T16:34:41.737   Echocardiogram  Procedure date:  12/15/2009  Findings:       Left ventricle: The cavity size was normal. Wall thickness was   increased in a pattern of mild LVH. The estimated ejection fraction   was 60%. Wall motion was normal; there were no regional wall motion   abnormalities.   ----------------------------------------------------------------------------------    Prepared and Electronically Authenticated by    Willa Rough, MD, [facility]   2011-02-23T16:06:[id number]

## 2010-11-22 NOTE — Progress Notes (Signed)
Summary: RC  Phone Note Call from Patient Call back at Home Phone [phone]   Caller: Patient Call For: Marcelino Duster Summary of Call: Pt called and LM would like for you to call her Initial call taken by: Kathlene November,  July 13, 2010 10:21 AM

## 2010-11-22 NOTE — Assessment & Plan Note (Signed)
Summary: NEW PT [facility]   Vital Signs:  Patient Profile:   55 Years Old Female Height:     67 inches Weight:      280 pounds BMI:     44.01 O2 treatment:    Room Air Temp:     98.0 degrees F oral Pulse rate:   67 / minute Pulse rhythm:   regular Resp:     20 per minute BP sitting:   135 / 75  (left arm) Cuff size:   regular  Pt. in pain?   no  Vitals Entered By: Darra Lis RMA (June 18, 2008 9:21 AM)  Menstrual History: LMP - Character: menopausal Menarche: 12 Menses interval: 28 days Menstrual flow: 5 days              Is Patient Diabetic? Yes      Visit Type:  new patient - acute PCP:  Paulo Fruit MD  Chief Complaint:  Here to establish care with a new doctor.Marland Kitchen  History of Present Illness: Patient was treated for a UTI and dehydration a few weeks ago - all sx cleared, but her urine is still a little dark - much better, but a little dark. She knows she is not drinking enough water.  She was told to f/u with PCP and wants to establish here.  BGM 100-120  Is due for labwork. No other concerns.      Updated Prior Medication List: ALTACE 2.5 MG CAPS (RAMIPRIL) Take 1 tablet by mouth once a day BAYER ASPIRIN EC LOW DOSE 81 MG TBEC (ASPIRIN) Take 1 tablet by mouth once a day ACTOS 45 MG TABS (PIOGLITAZONE HCL) Take 1 tablet by mouth once a day AMITRIPTYLINE HCL 25 MG TABS (AMITRIPTYLINE HCL) 1 to 2 tabs daily LIPITOR 10 MG TABS (ATORVASTATIN CALCIUM) 1/2 tablet a day TRIAMTERENE-HCTZ 37.5-25 MG CAPS (TRIAMTERENE-HCTZ) Take 1 tablet by mouth once a day LORTAB 5 5-500 MG TABS (HYDROCODONE-ACETAMINOPHEN) one tablet by mouth twice daily METOPROLOL TARTRATE 25 MG TABS (METOPROLOL TARTRATE) Take 1 tablet by mouth once a day OMEPRAZOLE 20 MG CPDR (OMEPRAZOLE) Take 1 tablet by mouth once a day GLYBURIDE-METFORMIN 5-500 MG TABS (GLYBURIDE-METFORMIN) Take 1 tablet by mouth once a day PRISTIQ 50 MG XR24H-TAB (DESVENLAFAXINE SUCCINATE) Take 1 tablet by mouth once a  day  Current Allergies (reviewed today): No known allergies   Past Medical History:    PROTEINURIA (ICD-791.0) - sees urology    HYPERTENSION (ICD-401.9)    BACK PAIN, CHRONIC (ICD-724.5) - sees ortho and they write for pain meds    INSOMNIA (ICD-780.52)    GERD (ICD-530.81)    HYPERLIPIDEMIA (ICD-272.4)    OSTEOARTHRITIS (ICD-715.90)    NEPHROLITHIASIS, HX OF (ICD-V13.01) - sees urology    DIABETES MELLITUS, TYPE II (ICD-250.00)    DEPRESSION (ICD-311)    Peripheral vascular disease  Past Surgical History:    Carpal tunnel release    cardiac ablation    Lumbar laminectomy    Lumbar fusion   Family History:    Family History Diabetes    Family History Hypertension    Family History of Alcoholism - mother    Family History of CAD Female   Social History:    2 children (one biologic -in their 76's and one adopted who is 56 yo)    Occupation:disabled    Married   Risk Factors:  Tobacco use:  quit    Year quit:  1987      Pack-years:  18 years  1/2  pack daily Passive smoke exposure:  no Drug use:  no HIV high-risk behavior:  no Caffeine use:  2 drinks per day Alcohol use:  no Exercise:  no Seatbelt use:  100 % Sun Exposure:  occasionally  Family History Risk Factors:    Family History of MI in females < 6 years old:  no    Family History of MI in males < 95 years old:  no  Mammogram History:     Date of Last Mammogram:  10/23/2007    Results:  Normal Bilateral   Colonoscopy History:     Date of Last Colonoscopy:  06/22/2007    Results:  Normal   PAP Smear History:     Date of Last PAP Smear:  06/22/2007    Results:  Normal    Review of Systems       The patient complains of peripheral edema and incontinence.  The patient denies anorexia, fever, weight loss, weight gain, vision loss, decreased hearing, hoarseness, chest pain, syncope, dyspnea on exertion, prolonged cough, headaches, hemoptysis, abdominal pain, melena, hematochezia, severe  indigestion/heartburn, hematuria, genital sores, muscle weakness, suspicious skin lesions, transient blindness, difficulty walking, depression, unusual weight change, abnormal bleeding, enlarged lymph nodes, angioedema, and breast masses.         Patient has chronic peripheral edema and some mild incontinence - no changes in either.  Is not on meds for incontinence and doesn't feel she needs any at this point.   Physical Exam  General:     Well-developed, well nourished, well hydrated morbidly obese female  in no acute distress; appropriate and cooperative   Head:     Normocephalic and atraumatic without obvious abnormalities.  Eyes:     No corneal or conjunctival inflammation. EOMI. PERRLA. Funduscopic exam benign, Vision grossly normal. Ears:     External ear shows no significant lesions or deformities.  Otoscopic examination reveals clear canals, tympanic membranes normal bilaterally. Hearing is grossly normal. Nose:     External nasal examination shows no deformity or inflammation. Nasal mucosa are pink and moist without lesions or exudates. Mouth:     Oral mucosa and oropharynx without lesions, erythema or exudates.  Teeth in good repair. no postnasal drip and no tongue abnormalities.   Neck:     supple, full ROM, no masses, and no thyromegaly.   Chest Wall:     No deformities, masses, or tenderness noted. Lungs:     Normal respiratory effort, chest expands symmetrically with good air flow noted. Lungs clear to auscultation with no crackles, rales or wheezes.   Heart:     Normal rate and  rhythm. S1 and S2 normal, without gallop, murmur, click, rub  Abdomen:     Bowel sounds positive, abdomen soft and non-tender without masses, organomegaly or hernias noted. no distention  Msk:     No gross deformity noted of cervical, thoracic or lumbar spine. normal ROM. no muscle atrophy noted.   Extremities:     1+ edema on the right and trace on left - baseline per pt.  stasis changes  present Neurologic:     alert & oriented X3,  gait normal.  no deficit in strength noted, no balance problems noted, neuro is grossly intact Skin:     Intact without suspicious lesions or rashes Psych:     Cognition and judgment appear intact. Alert and cooperative with normal attention span and concentration. No apparent delusions, illusions, hallucinations, normally interactive, good eye contact, not anxious or depressed appearing,  and not agitated.       Impression & Recommendations:  Problem # 1:  DIABETES MELLITUS, TYPE II (ICD-250.00) Continue current meds.  Check labs.  If all looks good, then f/u in 3 months. Her updated medication list for this problem includes:    Altace 2.5 Mg Caps (Ramipril) .Marland Kitchen... Take 1 tablet by mouth once a day    Bayer Aspirin Ec Low Dose 81 Mg Tbec (Aspirin) .Marland Kitchen... Take 1 tablet by mouth once a day    Actos 45 Mg Tabs (Pioglitazone hcl) .Marland Kitchen... Take 1 tablet by mouth once a day    Glyburide-metformin 5-500 Mg Tabs (Glyburide-metformin) .Marland Kitchen... Take 1 tablet by mouth once a day  Orders: TLB-Lipid Panel (80061-LIPID) TLB-CBC Platelet - w/Differential (85025-CBCD) TLB-TSH (Thyroid Stimulating Hormone) (84443-TSH) TLB-CMP (Comprehensive Metabolic Pnl) (80053-COMP) TLB-A1C / Hgb A1C (Glycohemoglobin) (83036-A1C)  More than 45 minutes spent with the patient today.   Problem # 2:  HYPERTENSION (ICD-401.9) Continue current meds.  Check labs.  If all looks good, then f/u in 3 months. Her updated medication list for this problem includes:    Altace 2.5 Mg Caps (Ramipril) .Marland Kitchen... Take 1 tablet by mouth once a day    Triamterene-hctz 37.5-25 Mg Caps (Triamterene-hctz) .Marland Kitchen... Take 1 tablet by mouth once a day    Metoprolol Tartrate 25 Mg Tabs (Metoprolol tartrate) .Marland Kitchen... Take 1/2  tablet by mouth twice daily  Orders: TLB-Lipid Panel (80061-LIPID) TLB-CBC Platelet - w/Differential (85025-CBCD) TLB-TSH (Thyroid Stimulating Hormone) (84443-TSH) TLB-CMP  (Comprehensive Metabolic Pnl) (80053-COMP) TLB-A1C / Hgb A1C (Glycohemoglobin) (83036-A1C)   Problem # 3:  HYPERLIPIDEMIA (ICD-272.4) Continue current meds.  Check labs.  If all looks good, then f/u in 3 months. Her updated medication list for this problem includes:    Lipitor 10 Mg Tabs (Atorvastatin calcium) .Marland Kitchen... 1/2 tablet a day  Orders: TLB-Lipid Panel (80061-LIPID) TLB-CBC Platelet - w/Differential (85025-CBCD) TLB-TSH (Thyroid Stimulating Hormone) (84443-TSH) TLB-CMP (Comprehensive Metabolic Pnl) (80053-COMP) TLB-A1C / Hgb A1C (Glycohemoglobin) (83036-A1C)   Problem # 4:  DARK URINE (ICD-788.69) The urine shows protein - known problem - and specific gravity 1.020 - showing mild dehydration. Patient to increase fluid intake. Orders: UA Dipstick w/o Micro (manual) ([address])   Problem # 5:  ENCOUNTER FOR LONG-TERM USE OF OTHER MEDICATIONS (ICD-V58.69)  Orders: TLB-Lipid Panel (80061-LIPID) TLB-CBC Platelet - w/Differential (85025-CBCD) TLB-TSH (Thyroid Stimulating Hormone) (84443-TSH) TLB-CMP (Comprehensive Metabolic Pnl) (80053-COMP) TLB-A1C / Hgb A1C (Glycohemoglobin) (83036-A1C)   Problem # 6:  BACK PAIN, CHRONIC (ICD-724.5) Patient to continue with care as outlined with orthopedist.  They will manage narcotic medications. Her updated medication list for this problem includes:    Bayer Aspirin Ec Low Dose 81 Mg Tbec (Aspirin) .Marland Kitchen... Take 1 tablet by mouth once a day    Lortab 5 5-500 Mg Tabs (Hydrocodone-acetaminophen) ..... One tablet by mouth twice daily   Problem # 7:  PROTEINURIA (ICD-791.0) patient needs referral back to urology for continued care Orders: Urology Referral (Urology)   Problem # 8:  OBESITY (ICD-278.00) I reviewed with patient in detail that there is no "magic answer" to weight loss, but that weight loss is critical to their overall health.  It is a matter of calories in -vs- calories out.  I discussed with the patient the various programs  available for helping with weight loss including Internet sites like eDiet, in person programs like Weight Watchers and [employer], dedicated weight loss clinics, and over the counter medication like Alli.    Complete Medication List: 1)  Altace  2.5 Mg Caps (Ramipril) .... Take 1 tablet by mouth once a day 2)  Bayer Aspirin Ec Low Dose 81 Mg Tbec (Aspirin) .... Take 1 tablet by mouth once a day 3)  Actos 45 Mg Tabs (Pioglitazone hcl) .... Take 1 tablet by mouth once a day 4)  Amitriptyline Hcl 25 Mg Tabs (Amitriptyline hcl) .Marland Kitchen.. 1 to 2 tabs daily by mouth at bedtime 5)  Lipitor 10 Mg Tabs (Atorvastatin calcium) .... 1/2 tablet a day 6)  Triamterene-hctz 37.5-25 Mg Caps (Triamterene-hctz) .... Take 1 tablet by mouth once a day 7)  Lortab 5 5-500 Mg Tabs (Hydrocodone-acetaminophen) .... One tablet by mouth twice daily 8)  Metoprolol Tartrate 25 Mg Tabs (Metoprolol tartrate) .... Take 1/2  tablet by mouth twice daily 9)  Omeprazole 20 Mg Cpdr (Omeprazole) .... Take 1 tablet by mouth once a day 10)  Glyburide-metformin 5-500 Mg Tabs (Glyburide-metformin) .... Take 1 tablet by mouth once a day 11)  Pristiq 50 Mg Xr24h-tab (Desvenlafaxine succinate) .... Take 1 tablet by mouth once a day    ] Laboratory Results   Urine Tests  Date/Time Received: June 18, 2008 9:58 AM  Date/Time Reported: June 18, 2008 9:58 AM   Routine Urinalysis   Color: yellow Appearance: Clear Glucose: negative   (Normal Range: Negative) Bilirubin: negative   (Normal Range: Negative) Ketone: negative   (Normal Range: Negative) Spec. Gravity: 1.020   (Normal Range: 1.003-1.035) Blood: negative   (Normal Range: Negative) pH: 5.0   (Normal Range: 5.0-8.0) Protein: trace   (Normal Range: Negative) Urobilinogen: 0.2   (Normal Range: 0-1) Nitrite: negative   (Normal Range: Negative) Leukocyte Esterace: negative   (Normal Range: Negative)    Comments: Darra Lis, RMA

## 2010-11-22 NOTE — Progress Notes (Signed)
Summary: OTC cold meds  Phone Note Call from Patient   Caller: Patient Summary of Call: Pt has same cold/virus Sx's as son had. What OTC meds do you recommend for Pt. Pt wanted your advise due to her DM. Please advise.  Initial call taken by: Payton Spark CMA,  September 10, 2009 11:20 AM  Follow-up for Phone Call        pt can take OTC Mucinex DM. Follow-up by: Seymour Bars DO,  September 10, 2009 11:37 AM     Appended Document: OTC cold meds Pt aware

## 2010-11-22 NOTE — Cardiovascular Report (Signed)
Summary: Pre Cath Orders  Pre Cath Orders   Imported By: Roderic Ovens 02/16/2010 16:04:32  _____________________________________________________________________  External Attachment:    Type:   Image     Comment:   External Document

## 2010-11-22 NOTE — Medication Information (Signed)
Summary: Denial for Patient Assistance Program/[employer]  Denial for Patient Assistance Program/[employer]   Imported By: Lanelle Bal 10/11/2009 09:40:29  _____________________________________________________________________  External Attachment:    Type:   Image     Comment:   External Document

## 2010-11-22 NOTE — Letter (Signed)
Summary: Cardiac Catheterization Instructions- [facility]  [facility], Main Office  [address]   [place], [place] [address]   Phone: [phone]  Fax: [phone]     02/09/2010 MRN: [id number]  [facility] [address] [place], [place]  [address]  Dear Ms. QUIZHPI,   You are scheduled for a Cardiac Catheterization on Monday April 25,2011 with Dr. Shirlee Latch.  Please arrive to the 1st floor of the [facility] and V[facility] at [facility] at 7:30  am  on the day of your procedure. Please do not arrive before 6:30 a.m. Call the Heart and Vascular Center at [phone] if you are unable to make your appointmnet. The Code to get into the parking garage under the building is 0001. Take the elevators to the 1st floor. You must have someone to drive you home. Someone must be with you for the first 24 hours after you arrive home. Please wear clothes that are easy to get on and off and wear slip-on shoes. Do not eat or drink after midnight except water with your medications that morning. Bring all your medications and current insurance cards with you.  _x__ DO NOT take these medications before your procedure:  DO NOT TAKE METFORMIN STARTING SUNDAY NIGHT APRIL 24,2011 AND FOR 48 HOURS.  DO NOT TAKE ACTOS MONDAY MORNING APRIL 25,2011. DO NOT TAKE VICTOZA SUNDAY NIGHT APRIL 52,8413.  __x_ Make sure you take your aspirin.  _x__ You may take ALL of your other medications with water that morning. ________________________________________________________________________________________________________________________________      The usual length of stay after your procedure is 2 to 3 hours. This can vary.  If you have any questions, please call the office at the number listed above.   Katina Dung, RN, BSN

## 2010-11-22 NOTE — Miscellaneous (Signed)
Summary: no retinopathy  Clinical Lists Changes  Observations: Added new observation of DIAB EYE EX: no retinopathy (Dr Pearlean Brownie) (07/07/2010 11:52)

## 2010-11-22 NOTE — Medication Information (Signed)
Summary: Approval for Actos/[employer]  Approval for Act[employer]   Imported By: Lanelle Bal 12/30/2008 08:01:11  _____________________________________________________________________  External Attachment:    Type:   Image     Comment:   External Document

## 2010-11-22 NOTE — Assessment & Plan Note (Signed)
Summary: T2DM/ BP f/u   Vital Signs:  Patient profile:   55 year old female Height:      67 inches Weight:      281 pounds BMI:     44.17 O2 Sat:      95 % on Room air Temp:     98.1 degrees F oral Pulse rate:   87 / minute BP sitting:   130 / 78  (left arm) Cuff size:   large  Vitals Entered By: Payton Spark CMA (May 18, 2009 11:19 AM)  O2 Flow:  Room air CC: 1 mo FU. Fasting sugar 104 yesterday. ?UTI x 1 day.    Primary Care Provider:  Seymour Bars DO  CC:  1 mo FU. Fasting sugar 104 yesterday. ?UTI x 1 day. Marland Kitchen  History of Present Illness: 55 yo WF presents for f/u T2DM after having back surgery with Dr Noel Gerold last month.  She is doing well.  Still on pain meds along with a bowel regimen to prevent constipation. Her physical activity is very limited.  She is trying to eat healthier but has failed to lose any wt.  Compliant with Actos and Glyburide- Metformin.  Her AM fastings are around 100.  Not checkin post prandials.  Due for eye exam and microalbumin.  Her BP remains well controlled.  Denies problems with CP or DOE.  Her leg swelling has improved.  Due for A1C and RF on meds.  c/o dysuria and incontinece (chronic) worse since having foley catheter in.  Current Medications (verified): 1)  Altace 2.5 Mg Caps (Ramipril) .... Take 1 Tablet By Mouth Once A Day 2)  Bayer Aspirin Ec Low Dose 81 Mg Tbec (Aspirin) .... Take 1 Tablet By Mouth Once A Day 3)  Actos 45 Mg Tabs (Pioglitazone Hcl) .... Take 1 Tablet By Mouth Once A Day 4)  Amitriptyline Hcl 25 Mg Tabs (Amitriptyline Hcl) .Marland Kitchen.. 1 To 2 Tabs Daily By Mouth At Bedtime 5)  Lipitor 10 Mg Tabs (Atorvastatin Calcium) .... 1/2 Tablet A Day 6)  Omeprazole 20 Mg Cpdr (Omeprazole) .... Take 1 Tablet By Mouth Once A Day 7)  Glyburide-Metformin 5-500 Mg Tabs (Glyburide-Metformin) .... Take 2  Tablets By Mouth in The Am and 1 Tablet By Mouth in The Pm 8)  Pristiq 100 Mg Xr24h-Tab (Desvenlafaxine Succinate) .... One Tab By Mouth Once  Daily 9)  Hydrochlorothiazide 12.5 Mg Caps (Hydrochlorothiazide) .Marland Kitchen.. 1 Cap By Mouth Once Daily 10)  Fluticasone Propionate 50 Mcg/act Susp (Fluticasone Propionate) .Marland Kitchen.. 1 Spray Each Nostril Twice Daily 11)  Lortab 5 5-500 Mg Tabs (Hydrocodone-Acetaminophen) .... One By Mouth Two Times A Day Prn 12)  Ventolin Hfa 108 (90 Base) Mcg/act Aers (Albuterol Sulfate) .Marland Kitchen.. 1-2 Puffs Every 3-4 Hours As Needed Shortness of Breath  Allergies (verified): 1)  Claritin  Comments:  Nurse/Medical Assistant: Payton Spark CMA (May 18, 2009 11:21 AM) The patient's medications were reviewed with the patient and were updated in the Medication List.  Past History:  Past Medical History: Reviewed history from 08/19/2008 and no changes required. PROTEINURIA (ICD-791.0) - sees urology HYPERTENSION (ICD-401.9) BACK PAIN, CHRONIC (ICD-724.5) - sees ortho and they write for pain meds INSOMNIA (ICD-780.52) GERD (ICD-530.81) HYPERLIPIDEMIA (ICD-272.4) OSTEOARTHRITIS (ICD-715.90) NEPHROLITHIASIS, HX OF (ICD-V13.01) - sees urology DIABETES MELLITUS, TYPE II (ICD-250.00) DEPRESSION (ICD-311) Peripheral vascular disease  reactive airway disease with recurrent bronchitis and h/o pneumonia  Social History: Reviewed history from 08/03/2008 and no changes required. 2 children (one biologic -in their 30's and one  adopted who is 55 yo) Occupation:disabled Married Former smoker  Review of Systems      See HPI  Physical Exam  General:  obese WF in NAD, wearing back brace Head:  normocephalic and atraumatic.   Eyes:  pupils equal, pupils round, and pupils reactive to light.   Nose:  no nasal discharge.   Mouth:  pharynx pink and moist.   Neck:  no masses.   Lungs:  normal respiratory effort, no accessory muscle use, and normal breath sounds.   Heart:  normal rate, regular rhythm, and no murmur.   Extremities:  bilat 1+ pitting LE edema with stasis dermatitis and prominent spider veins. Skin:  Lumbar spine  incision healing well. Psych:  good eye contact, not anxious appearing, and not depressed appearing.     Impression & Recommendations:  Problem # 1:  DIABETES MELLITUS, TYPE II (ICD-250.00) Complicated by neuropathy.  Good control by home glucose monitoring.  Check A1C and urine microalbumin today.  Update her diabetic eye exam.  Continue current meds.  RFd.  Work on diet and wt loss.  Exercise limited due to recent back surgery.  BP at goal.   Her updated medication list for this problem includes:    Altace 2.5 Mg Caps (Ramipril) .Marland Kitchen... Take 1 tablet by mouth once a day    Bayer Aspirin Ec Low Dose 81 Mg Tbec (Aspirin) .Marland Kitchen... Take 1 tablet by mouth once a day    Actos 45 Mg Tabs (Pioglitazone hcl) .Marland Kitchen... Take 1 tablet by mouth once a day    Glyburide-metformin 5-500 Mg Tabs (Glyburide-metformin) .Marland Kitchen... Take 2  tablets by mouth in the am and 1 tablet by mouth in the pm  Orders: T-Hemoglobin A1C (65784) T-Urine Microalbumin w/creat. ratio [phone] / [id number]) Ophthalmology Referral (Ophthalmology)  Problem # 2:  INCONTINENCE (ICD-788.30)  Chronic.  Dysuria since foley placement but UA is normal today.  Can try an OTC Vagisil cream for comfort.  If not improving, return to see her regular urologist.    Orders: UA Dipstick w/o Micro (manual) (01027)  Problem # 3:  HYPERLIPIDEMIA (ICD-272.4) Labs due at next visit: FLP.  On lipitor with LDL at goal, low HDL. Her updated medication list for this problem includes:    Lipitor 10 Mg Tabs (Atorvastatin calcium) .Marland Kitchen... 1/2 tablet a day  Labs Reviewed: SGOT: 34 (01/25/2009)   SGPT: 28 (01/25/2009)   HDL:28.4 (06/18/2008)  LDL:68 (06/18/2008)  Chol:119 (06/18/2008)  Trig:115 (06/18/2008)  Problem # 4:  HYPERTENSION (ICD-401.9) BP at goal on current meds.  Continue.  BMP is UTD. Her updated medication list for this problem includes:    Altace 2.5 Mg Caps (Ramipril) .Marland Kitchen... Take 1 tablet by mouth once a day    Hydrochlorothiazide 12.5 Mg Caps  (Hydrochlorothiazide) .Marland Kitchen... 1 cap by mouth once daily  BP today: 130/78 Prior BP: 106/82 (04/06/2009)  Labs Reviewed: K+: 3.9 (01/25/2009) Creat: : 0.7 (01/25/2009)   Chol: 119 (06/18/2008)   HDL: 28.4 (06/18/2008)   LDL: 68 (06/18/2008)   TG: 115 (06/18/2008)  Complete Medication List: 1)  Altace 2.5 Mg Caps (Ramipril) .... Take 1 tablet by mouth once a day 2)  Bayer Aspirin Ec Low Dose 81 Mg Tbec (Aspirin) .... Take 1 tablet by mouth once a day 3)  Actos 45 Mg Tabs (Pioglitazone hcl) .... Take 1 tablet by mouth once a day 4)  Amitriptyline Hcl 25 Mg Tabs (Amitriptyline hcl) .Marland Kitchen.. 1 to 2 tabs daily by mouth at bedtime 5)  Lipitor 10  Mg Tabs (Atorvastatin calcium) .... 1/2 tablet a day 6)  Omeprazole 20 Mg Cpdr (Omeprazole) .... Take 1 tablet by mouth once a day 7)  Glyburide-metformin 5-500 Mg Tabs (Glyburide-metformin) .... Take 2  tablets by mouth in the am and 1 tablet by mouth in the pm 8)  Pristiq 100 Mg Xr24h-tab (Desvenlafaxine succinate) .... One tab by mouth once daily 9)  Hydrochlorothiazide 12.5 Mg Caps (Hydrochlorothiazide) .Marland Kitchen.. 1 cap by mouth once daily 10)  Fluticasone Propionate 50 Mcg/act Susp (Fluticasone propionate) .Marland Kitchen.. 1 spray each nostril twice daily 11)  Lortab 5 5-500 Mg Tabs (Hydrocodone-acetaminophen) .... One by mouth two times a day prn 12)  Ventolin Hfa 108 (90 Base) Mcg/act Aers (Albuterol sulfate) .Marland Kitchen.. 1-2 puffs every 3-4 hours as needed shortness of breath  Patient Instructions: 1)  Stay on current meds. 2)  AM fasting goal 90-110 (check 3 x a wk) 3)  2 hrs after dinner goal is <150 (check 1 x a wk). 4)  Stay on diabetic diet and work on wt loss thru healthy diet and physical activity. 5)  Update A1C, diabetic eye exam and urine microalbumin today. 6)  UA is negative for infection. 7)  Return for f/u in 3 mos. Prescriptions: FLUTICASONE PROPIONATE 50 MCG/ACT SUSP (FLUTICASONE PROPIONATE) 1 spray each nostril twice daily  #2 x 3   Entered and Authorized  by:   Seymour Bars DO   Signed by:   Seymour Bars DO on 05/18/2009   Method used:   Electronically to        [id number]* (retail)       [address] [place], [place]  [address]       Ph: [phone]       Fax: [phone]   RxID:   [id number] HYDROCHLOROTHIAZIDE 12.5 MG CAPS (HYDROCHLOROTHIAZIDE) 1 cap by mouth once daily  #30.0 Each x 3   Entered and Authorized by:   Seymour Bars DO   Signed by:   Seymour Bars DO on 05/18/2009   Method used:   Electronically to        [facility] #[id number]* (retail)       [address] [place], [place]  [address]       Ph: [phone]       Fax: [phone]   RxID:   [id number] PRISTIQ 100 MG XR24H-TAB (DESVENLAFAXINE SUCCINATE) one tab by mouth once daily  #30.0 Each x 3   Entered and Authorized by:   Seymour Bars DO   Signed by:   Seymour Bars DO on 05/18/2009   Method used:   Electronically to        [facility] #[id number]* (retail)       [address] [place], [place]  [address]       Ph: [phone]       Fax: [phone]   RxID:   [id number] GLYBURIDE-METFORMIN 5-500 MG TABS (GLYBURIDE-METFORMIN) Take 2  tablets by mouth in the am and 1 tablet by mouth in the pm  #90.0 Each x 1   Entered and Authorized by:   Seymour Bars DO   Signed by:   Seymour Bars DO on 05/18/2009   Method used:   Electronically to        [facility] #[id number]* (retail)       [address]  [place], [place]  [address]       Ph: [phone]       Fax: [phone]   RxID:   [id number] OMEPRAZOLE 20 MG CPDR (OMEPRAZOLE) Take 1 tablet by mouth once a day  #30 x 2   Entered and Authorized by:   Seymour Bars DO   Signed by:   Seymour Bars DO on 05/18/2009   Method used:   Electronically to        [facility] #[id number]* (retail)       [address] [place], [place]  [address]       Ph: [phone]       Fax: [phone]   RxID:   [id number] LIPITOR 10 MG TABS  (ATORVASTATIN CALCIUM) 1/2 tablet a day  #30 x 2   Entered and Authorized by:   Seymour Bars DO   Signed by:   Seymour Bars DO on 05/18/2009   Method used:   Electronically to        [facility] #[id number]* (retail)       [address] [place], [place]  [address]       Ph: [phone]       Fax: [phone]   RxID:   [id number] ACTOS 45 MG TABS (PIOGLITAZONE HCL) Take 1 tablet by mouth once a day  #90.0 Each x 1   Entered and Authorized by:   Seymour Bars DO   Signed by:   Seymour Bars DO on 05/18/2009   Method used:   Electronically to        [facility] #[id number]* (retail)       [address] [place], [place]  [address]       Ph: [phone]       Fax: [phone]   RxID:   [id number] ALTACE 2.5 MG CAPS (RAMIPRIL) Take 1 tablet by mouth once a day  #90.0 Each x 1   Entered and Authorized by:   Seymour Bars DO   Signed by:   Seymour Bars DO on 05/18/2009   Method used:   Electronically to        [facility] #[id number]* (retail)       [address] [place], [place]  [address]       Ph: [phone]       Fax: [phone]   RxID:   [id number]   Laboratory Results   Urine Tests    Routine Urinalysis   Color: yellow Appearance: Clear Glucose: negative   (Normal Range: Negative) Bilirubin: negative   (Normal Range: Negative) Ketone: negative   (Normal Range: Negative) Spec. Gravity: >=1.030   (Normal Range: 1.003-1.035) Blood: negative   (Normal Range: Negative) pH: 5.0   (Normal Range: 5.0-8.0) Protein: negative   (Normal Range: Negative) Urobilinogen: 0.2   (Normal Range: 0-1) Nitrite: negative   (Normal Range: Negative) Leukocyte Esterace: negative   (Normal Range: Negative)

## 2010-11-22 NOTE — Assessment & Plan Note (Signed)
Summary: 3 MONTHS ROV-CH   Vital Signs:  Patient profile:   55 year old female Height:      67 inches Weight:      282 pounds BMI:     44.33 Temp:     98.1 degrees F oral Pulse rate:   88 / minute Pulse rhythm:   regular Resp:     20 per minute BP sitting:   122 / 70  (left arm) Cuff size:   large  Vitals Entered By: Darra Lis RMA (January 25, 2009 11:01 AM) CC: follow-up visit   History of Present Illness: Patient states she is doing well on her medication.  BGM 80 - 120  Also, patient with a history of proteinuria and strong family history of kidney failure.  Has been seen by renal and is UTD with follow up.  BP meds working well.  Hyperlipidemia doing well on current meds and labs UTD.  History of depression doing well since we increased her dose of pristiq  Has insomnia and uses amitriptyline with good relief.  Has GERD and does well with current meds.    Current Medications (verified): 1)  Altace 2.5 Mg Caps (Ramipril) .... Take 1 Tablet By Mouth Once A Day 2)  Bayer Aspirin Ec Low Dose 81 Mg Tbec (Aspirin) .... Take 1 Tablet By Mouth Once A Day 3)  Actos 45 Mg Tabs (Pioglitazone Hcl) .... Take 1 Tablet By Mouth Once A Day 4)  Amitriptyline Hcl 25 Mg Tabs (Amitriptyline Hcl) .Marland Kitchen.. 1 To 2 Tabs Daily By Mouth At Bedtime 5)  Lipitor 10 Mg Tabs (Atorvastatin Calcium) .... 1/2 Tablet A Day 6)  Omeprazole 20 Mg Cpdr (Omeprazole) .... Take 1 Tablet By Mouth Once A Day 7)  Glyburide-Metformin 5-500 Mg Tabs (Glyburide-Metformin) .... Take 2  Tablets By Mouth in The Am and 1 Tablet By Mouth in The Pm 8)  Pristiq 100 Mg Xr24h-Tab (Desvenlafaxine Succinate) .... One Tab By Mouth Once Daily 9)  Hydrochlorothiazide 12.5 Mg Caps (Hydrochlorothiazide) .Marland Kitchen.. 1 Cap By Mouth Once Daily 10)  Fluticasone Propionate 50 Mcg/act Susp (Fluticasone Propionate) .Marland Kitchen.. 1 Spray Each Nostril Twice Daily 11)  Lortab 5 5-500 Mg Tabs (Hydrocodone-Acetaminophen) .... One By Mouth Two Times A Day  Prn 12)  Ventolin Hfa 108 (90 Base) Mcg/act Aers (Albuterol Sulfate) .Marland Kitchen.. 1-2 Puffs Every 3-4 Hours As Needed Shortness of Breath  Allergies: 1)  Claritin  Past History:  Past Medical History:    Reviewed history from 08/19/2008 and no changes required:    PROTEINURIA (ICD-791.0) - sees urology    HYPERTENSION (ICD-401.9)    BACK PAIN, CHRONIC (ICD-724.5) - sees ortho and they write for pain meds    INSOMNIA (ICD-780.52)    GERD (ICD-530.81)    HYPERLIPIDEMIA (ICD-272.4)    OSTEOARTHRITIS (ICD-715.90)    NEPHROLITHIASIS, HX OF (ICD-V13.01) - sees urology    DIABETES MELLITUS, TYPE II (ICD-250.00)    DEPRESSION (ICD-311)    Peripheral vascular disease     reactive airway disease with recurrent bronchitis and h/o pneumonia  Past Surgical History:    Reviewed history from 08/03/2008 and no changes required:    Carpal tunnel release    cardiac ablation    Lumbar laminectomy    Lumbar fusion   Review of Systems       no nausea / vomiting / diarrhea / fever / chills / chest pain / SOB   Physical Exam  Additional Exam:  General:     Well-developed, well  nourished, well hydrated morbidly obese female  in no acute distress; appropriate and cooperative Head:     Normocephalic and atraumatic without obvious abnormalities.  Eyes:     No corneal or conjunctival inflammation.vision grossly normal. Ears:     External ear shows no significant lesions or deformities. Hearing is grossly normal. Nose:     External nasal examination shows no deformity or inflammation.  Neck:     supple, full ROM Lungs:     Normal respiratory effort, chest expands symmetrically with good air flow noted. Lungs clear to auscultation with no crackles, rales or wheezes.   Heart:     Normal rate and  rhythm. S1 and S2 normal, without gallop, murmur, click, rub  Extremities:     1+ edema on the right and trace on left - patient's  baseline.  stasis changes present.   Neurologic:     alert & oriented X3,   no deficit in strength noted, no balance problems noted, neuro is grossly intact Skin:     Intact without suspicious lesions or rashes Psych:     Cognition and judgment appear intact. Alert and cooperative with normal attention span and concentration. No apparent delusions, illusions, hallucinations, normally interactive, good eye contact, not anxious or depressed appearing, and not agitated.      Impression & Recommendations:  Problem # 1:  DIABETES MELLITUS, TYPE II (ICD-250.00) continue current meds.  get labs.  follow up in 4 months Her updated medication list for this problem includes:    Altace 2.5 Mg Caps (Ramipril) .Marland Kitchen... Take 1 tablet by mouth once a day    Bayer Aspirin Ec Low Dose 81 Mg Tbec (Aspirin) .Marland Kitchen... Take 1 tablet by mouth once a day    Actos 45 Mg Tabs (Pioglitazone hcl) .Marland Kitchen... Take 1 tablet by mouth once a day    Glyburide-metformin 5-500 Mg Tabs (Glyburide-metformin) .Marland Kitchen... Take 2  tablets by mouth in the am and 1 tablet by mouth in the pm  Orders: TLB-CMP (Comprehensive Metabolic Pnl) (80053-COMP) TLB-A1C / Hgb A1C (Glycohemoglobin) (83036-A1C)  Problem # 2:  HYPERLIPIDEMIA (ICD-272.4) continue current meds.  labs UTD.   follow up in 4 months Her updated medication list for this problem includes:    Lipitor 10 Mg Tabs (Atorvastatin calcium) .Marland Kitchen... 1/2 tablet a day  Problem # 3:  HYPERTENSION (ICD-401.9) continue current meds.  get labs.  follow up in 4 months Her updated medication list for this problem includes:    Altace 2.5 Mg Caps (Ramipril) .Marland Kitchen... Take 1 tablet by mouth once a day    Hydrochlorothiazide 12.5 Mg Caps (Hydrochlorothiazide) .Marland Kitchen... 1 cap by mouth once daily  Orders: TLB-CMP (Comprehensive Metabolic Pnl) (80053-COMP)  Problem # 4:  DEPRESSION (ICD-311) continue current meds.    follow up in 4 months Her updated medication list for this problem includes:    Amitriptyline Hcl 25 Mg Tabs (Amitriptyline hcl) .Marland Kitchen... 1 to 2 tabs daily by mouth at  bedtime    Pristiq 100 Mg Xr24h-tab (Desvenlafaxine succinate) ..... One tab by mouth once daily  Problem # 5:  NONSPEC ELEVATION OF LEVELS OF TRANSAMINASE/LDH (ICD-790.4) patient with chronic, mild, stable elevatation.  due for recheck of labs Orders: TLB-CMP (Comprehensive Metabolic Pnl) (80053-COMP)  Problem # 6:  GERD (ICD-530.81) continue current meds.  follow up in 4 months Her updated medication list for this problem includes:    Omeprazole 20 Mg Cpdr (Omeprazole) .Marland Kitchen... Take 1 tablet by mouth once a day  Problem # 7:  ENCOUNTER FOR LONG-TERM USE OF OTHER MEDICATIONS (ICD-V58.69)  Orders: TLB-CMP (Comprehensive Metabolic Pnl) (80053-COMP)  Complete Medication List: 1)  Altace 2.5 Mg Caps (Ramipril) .... Take 1 tablet by mouth once a day 2)  Bayer Aspirin Ec Low Dose 81 Mg Tbec (Aspirin) .... Take 1 tablet by mouth once a day 3)  Actos 45 Mg Tabs (Pioglitazone hcl) .... Take 1 tablet by mouth once a day 4)  Amitriptyline Hcl 25 Mg Tabs (Amitriptyline hcl) .Marland Kitchen.. 1 to 2 tabs daily by mouth at bedtime 5)  Lipitor 10 Mg Tabs (Atorvastatin calcium) .... 1/2 tablet a day 6)  Omeprazole 20 Mg Cpdr (Omeprazole) .... Take 1 tablet by mouth once a day 7)  Glyburide-metformin 5-500 Mg Tabs (Glyburide-metformin) .... Take 2  tablets by mouth in the am and 1 tablet by mouth in the pm 8)  Pristiq 100 Mg Xr24h-tab (Desvenlafaxine succinate) .... One tab by mouth once daily 9)  Hydrochlorothiazide 12.5 Mg Caps (Hydrochlorothiazide) .Marland Kitchen.. 1 cap by mouth once daily 10)  Fluticasone Propionate 50 Mcg/act Susp (Fluticasone propionate) .Marland Kitchen.. 1 spray each nostril twice daily 11)  Lortab 5 5-500 Mg Tabs (Hydrocodone-acetaminophen) .... One by mouth two times a day prn 12)  Ventolin Hfa 108 (90 Base) Mcg/act Aers (Albuterol sulfate) .Marland Kitchen.. 1-2 puffs every 3-4 hours as needed shortness of breath  Patient Instructions: 1)  Please schedule a follow-up appointment in 4 months .

## 2010-11-22 NOTE — Assessment & Plan Note (Signed)
Summary: f/u sugars/ wt   Vital Signs:  Patient profile:   55 year old female Height:      67 inches Weight:      269 pounds BMI:     42.28 O2 Sat:      98 % on Room air Pulse rate:   96 / minute BP sitting:   122 / 79  (left arm) Cuff size:   large  Vitals Entered By: Payton Spark CMA (December 23, 2009 1:09 PM)  O2 Flow:  Room air CC: F/U weight and sugar. Averaging high 100's.    Primary Care Provider:  Seymour Bars DO  CC:  F/U weight and sugar. Averaging high 100's. Marland Kitchen  History of Present Illness: Ms. Lenig is a 55 year old woman here for follow up of weight and blood sugar. She checks her sugar every morning and it has been averaging in the 170s.  She is still taking her metformin and Actos. She has lost 9 pounds since she was last here a month ago. She has been taking a Victoza shot every morning and is on a pre-op diet with her husband who is having bariatric surgery soon. The diet consists of several protein shakes in place of meals/snacks. [facility] walking on [facility] treadmill and swimming. She recently had gyn surgery for polyp removal and received the pathology results yesterday that they were benign. She is still having heart palpitations and is currently on a heart monitor x3 weeks. She is scheduled to see her cardiologist on March 10.   Current Medications (verified): 1)  Altace 2.5 Mg Caps (Ramipril) .... Take 1 Tablet By Mouth Once A Day 2)  Bayer Aspirin Ec Low Dose 81 Mg Tbec (Aspirin) .... Take 1 Tablet By Mouth Once A Day 3)  Actos 45 Mg Tabs (Pioglitazone Hcl) .... Take 1 Tablet By Mouth Once A Day 4)  Lipitor 10 Mg Tabs (Atorvastatin Calcium) .... 1/2 Tablet A Day 5)  Omeprazole 20 Mg Cpdr (Omeprazole) .... Take 1 Tablet By Mouth Once A Day 6)  Metformin Hcl 1000 Mg Tabs (Metformin Hcl) .... Once Daily 7)  Pristiq 100 Mg Xr24h-Tab (Desvenlafaxine Succinate) .... One Tab By Mouth Once Daily 8)  Hydrochlorothiazide 12.5 Mg Caps  (Hydrochlorothiazide) .Marland Kitchen.. 1 Cap By Mouth Once Daily 9)  Fluticasone Propionate 50 Mcg/act Susp (Fluticasone Propionate) .... As Needed 10)  Lortab 5 5-500 Mg Tabs (Hydrocodone-Acetaminophen) .... One By Mouth Two Times A Day Prn 11)  Ventolin Hfa 108 (90 Base) Mcg/act Aers (Albuterol Sulfate) .Marland Kitchen.. 1-2 Puffs Every 3-4 Hours As Needed Shortness of Breath 12)  Gabapentin 300 Mg Caps (Gabapentin) .... 2 Capsules By Mouth Qhs 13)  Victoza 18 Mg/11ml Soln (Liraglutide) .... 1.2 Mg Subcutaneous Injection Once Daily With Breakfast 14)  Multivitamins   Tabs (Multiple Vitamin) .... For Women Once Daily 15)  Metoprolol Tartrate 25 Mg Tabs (Metoprolol Tartrate) .... One Tablet Twice A Day The Day Before and The Day of The Procedure  Allergies (verified): 1)  Claritin  Past History:  Past Medical History: Reviewed history from 12/01/2009 and no changes required. 1. HYPERTENSION (ICD-401.9) 2. BACK PAIN, CHRONIC (ICD-724.5) - Sees ortho and they write for pain meds. History of posterior spinal fusion.  3. INSOMNIA (ICD-780.52) 4. GERD (ICD-530.81) 5. HYPERLIPIDEMIA (ICD-272.4) 6. OSTEOARTHRITIS (ICD-715.90) 7. NEPHROLITHIASIS, HX OF (ICD-V13.01) - sees urology 8. DIABETES MELLITUS, TYPE II (ICD-250.00) 9. DEPRESSION (ICD-311) 10. Varicose veins 11. 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 Left heart cath in 5/08: No angiographic CAD, EF 60%.  13. Obesity  Social History: Reviewed history from 12/01/2009 and no changes required. 2 children (one biologic -in their 57's and one adopted who is 55 yo) Occupation:disabled Married Former smoker  Review of Systems      See HPI  Physical Exam  General:  Obese-appearing female in no acute distress. Head:  Normocephalic and atraumatic. Eyes:  Sclera clear.  Nose:  No rhinorrhea.  Mouth:  Oropharynx clear with no lesions or exudate.  Neck:  Supple with no lymphadenopathy.  Lungs:  Clear  to auscultation bilaterally. Normal work of breathing.  Heart:  RRR, normal S1 and S2. No murmurs, rubs, or gallops.  Pulses:  2+ radial pulses bilaterally.  Extremities:  1+ edema of lower legs bilaterally. No clubbing or cyanosis.  Psych:  Interacting appropriately with normal concentration and attention.    Impression & Recommendations:  Problem # 1:  DIABETES MELLITUS, TYPE II (ICD-250.00) Will increase Victoza to 1.8mg  [place] in evenings given morning fasting sugars in 170s. Continue metformin and Actos. Call in 1 week with morning sugars and follow up in 2 months. Continue weight loss efforts including diet and exercise.   Her updated medication list for this problem includes:    Altace 2.5 Mg Caps (Ramipril) .Marland Kitchen... Take 1 tablet by mouth once a day    Bayer Aspirin Ec Low Dose 81 Mg Tbec (Aspirin) .Marland Kitchen... Take 1 tablet by mouth once a day    Actos 45 Mg Tabs (Pioglitazone hcl) .Marland Kitchen... Take 1 tablet by mouth once a day    Metformin Hcl 1000 Mg Tabs (Metformin hcl) .Marland Kitchen... 1 tab by mouth bid    Victoza 18 Mg/51ml Soln (Liraglutide) .Marland Kitchen... 1.8 mg [place] in the evening  Problem # 2:  OBESITY (ICD-278.00) Improving.  Has lost 9 lbs.  Diet, exercise and Victoza have helped.  Complete Medication List: 1)  Altace 2.5 Mg Caps (Ramipril) .... Take 1 tablet by mouth once a day 2)  Bayer Aspirin Ec Low Dose 81 Mg Tbec (Aspirin) .... Take 1 tablet by mouth once a day 3)  Actos 45 Mg Tabs (Pioglitazone hcl) .... Take 1 tablet by mouth once a day 4)  Lipitor 10 Mg Tabs (Atorvastatin calcium) .... 1/2 tablet a day 5)  Omeprazole 20 Mg Cpdr (Omeprazole) .... Take 1 tablet by mouth once a day 6)  Metformin Hcl 1000 Mg Tabs (Metformin hcl) .Marland Kitchen.. 1 tab by mouth bid 7)  Pristiq 100 Mg Xr24h-tab (Desvenlafaxine succinate) .... One tab by mouth once daily 8)  Hydrochlorothiazide 12.5 Mg Caps (Hydrochlorothiazide) .Marland Kitchen.. 1 cap by mouth once daily 9)  Fluticasone Propionate 50 Mcg/act Susp (Fluticasone propionate) .... As  needed 10)  Lortab 5 5-500 Mg Tabs (Hydrocodone-acetaminophen) .... One by mouth two times a day prn 11)  Ventolin Hfa 108 (90 Base) Mcg/act Aers (Albuterol sulfate) .Marland Kitchen.. 1-2 puffs every 3-4 hours as needed shortness of breath 12)  Gabapentin 300 Mg Caps (Gabapentin) .... 2 capsules by mouth qhs 13)  Victoza 18 Mg/90ml Soln (Liraglutide) .... 1.8 mg  in the evening 14)  Multivitamins Tabs (Multiple vitamin) .... For women once daily 15)  Metoprolol Tartrate 25 Mg Tabs (Metoprolol tartrate) .... One tablet twice a day the day before and the day of the procedure 16)  Pen Needles 5/16" 31g X 8 Mm Misc (Insulin pen needle) .... Use once daily as directed  Patient Instructions: 1)  Increase  Victoza to 1.8 mg injection and use in the EVENINGS. 2)  Call in 1 wk with AM fasting readings. 3)  Keep up the good work with healthy diet and exercise. 4)  Return for f/u in 2 mos. Prescriptions: PEN NEEDLES 5/16" 31G X 8 MM MISC (INSULIN PEN NEEDLE) use once daily as directed  #30 x 2   Entered and Authorized by:   Seymour Bars DO   Signed by:   Seymour Bars DO on 12/23/2009   Method used:   Electronically to        [id number]* (retail)       [address] [place], [place]  [address]       Ph: [phone]       Fax: [phone]   RxID:   [id number]   Primary Care Provider:  Seymour Bars DO  CC:  F/U weight and sugar. Averaging high 100's. Marland Kitchen  History of Present Illness: Ms. Salminen is a 55 year old woman here for follow up of weight and blood sugar. She checks her sugar every morning and it has been averaging in the 170s.  She is still taking her metformin and Actos. She has lost 9 pounds since she was last here a month ago. She has been taking a Victoza shot every morning and is on a pre-op diet with her husband who is having bariatric surgery soon. The diet consists of several protein shakes in place of meals/snacks. [facility] and walking on the  treadmill and swimming. She recently had gyn surgery for polyp removal and received the pathology results yesterday that they were benign. She is still having heart palpitations and is currently on a heart monitor x3 weeks. She is scheduled to see her cardiologist on March 10.

## 2010-11-22 NOTE — Progress Notes (Signed)
Summary: poison ivy  Phone Note Call from Patient   Caller: Patient Summary of Call: Pt states she has poison ivy and was unable to get apt. Pt would like to know if you will send Rx to pharm. Please advise. Initial call taken by: Payton Spark CMA,  March 24, 2010 12:06 PM  Follow-up for Phone Call        she can use OTC Hydrocortisone cream if localized + Benadryl 25 mg q 6 hrs for itching Follow-up by: Seymour Bars DO,  March 24, 2010 12:27 PM     Appended Document: poison ivy Pt aware

## 2010-11-22 NOTE — Assessment & Plan Note (Signed)
Summary: f/u DM/ palpitations   Vital Signs:  Patient profile:   55 year old female Height:      67 inches Weight:      280.25 pounds BMI:     44.05 Pulse rate:   89 / minute BP sitting:   109 / 64  Vitals Entered By: Kandice Hams (November 25, 2009 10:42 AM) CC: 3 MONTH FOLLOWUP DIABETES   Primary Care Provider:  Seymour Bars DO  CC:  3 MONTH FOLLOWUP DIABETES.  History of Present Illness: 55 yo WF presents for f/u T2DM.  Her AM fastings are about 120.  She is fairly adherent with her diabetic diet.    A1c 7.3 today.  She is on Actos daily along with Glyburide- Metformin two times a day.  She had some postmenopausal bleeding and saw her gyn this wk.  She had a transvaginal u/s done b/c the gyn was no able to get an endometrial biopsy.  She goes back next wk for removal of a ? cervical polyp vs possible cancer.  She is worried about this.  She has been getting heart palpittions on and off.  She had to have an ablation in 02 with [facility].  She denies CP or DOE.  She does not drink caffeine.         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    Past Surgical History: Reviewed history from 08/03/2008 and no changes required. Carpal tunnel release cardiac ablation Lumbar laminectomy Lumbar fusion   Social History: Reviewed history from 08/03/2008 and no changes required. 2 children (one biologic -in their 68's and one adopted who is 55  yo) Occupation:disabled Married Former smoker  Review of Systems      See HPI  Physical Exam  General:  obese WF in NAD Head:  normocephalic and atraumatic.   Eyes:  pupils equal, pupils round, and pupils reactive to light.  wears glasses Nose:  no nasal discharge.   Mouth:  pharynx pink and moist.   Neck:  no masses.   Lungs:  Normal respiratory effort, chest expands symmetrically. Lungs are clear to auscultation, no crackles or wheezes. Heart:  Normal rate and regular rhythm. S1 and S2 normal without gallop, murmur, click, rub or other extra sounds. Extremities:  1+ pitting LE edema Skin:  color normal.   Cervical Nodes:  No lymphadenopathy noted Psych:  good eye contact, not anxious appearing, and not depressed appearing.     Impression & Recommendations:  Problem # 1:  DIABETES MELLITUS, TYPE II (ICD-250.00)  A1C 7.3 indicating fair control but still has a BMI of 44.  Since  she has tried working on exercise and portion control but failing to lose wt, will change her regimen to Victoza + Actos + metformin and eliminate her sulfonylurea.  Work on lifestyle changes.  Call if problems.  Monitor home sugar readings.  F/U in 4 wks.   Her updated medication list for this problem includes:    Altace 2.5 Mg Caps (Ramipril) .Marland Kitchen... Take 1 tablet by mouth once a day    Bayer Aspirin Ec Low Dose 81 Mg  Tbec (Aspirin) .Marland Kitchen... Take 1 tablet by mouth once a day    Actos 45 Mg Tabs (Pioglitazone hcl) .Marland Kitchen... Take 1 tablet by mouth once a day    Metformin Hcl 1000 Mg Tabs (Metformin hcl) .Marland Kitchen... 1 tab by mouth bid    Victoza 18 Mg/38ml Soln (Liraglutide) .Marland Kitchen... 1.2 mg subcutaneous injection once daily with breakfast  Labs Reviewed: Creat: 0.78 (08/24/2009)     Last Eye Exam: normal (06/16/2009) Reviewed HgBA1c results: 7.3 (11/25/2009)  7.3 (08/24/2009)  Orders: Fingerstick (36416) Hemoglobin A1C (09811)  Problem # 2:  PALPITATIONS (ICD-785.1) Assessment: New EKG today is abnormal but  apparently she has done stress testing and had an ablation thru Dr Riley Kill in the past.  I will get labs on her today to check for electrolyte imbalance, anemia or thyroid d/o and get her back into Dr Rosalyn Charters office.  This may be stress related since she is concerned about undergoing w/u for ? cervical mass. Orders: EKG w/ Interpretation (93000) T-Basic Metabolic Panel ([id number]) T-CBC No Diff ([id number]) T-TSH ([id number])  Problem # 3:  HYPERTENSION (ICD-401.9) At goal. Her updated medication list for this problem includes:    Altace 2.5 Mg Caps (Ramipril) .Marland Kitchen... Take 1 tablet by mouth once a day    Hydrochlorothiazide 12.5 Mg Caps (Hydrochlorothiazide) .Marland Kitchen... 1 cap by mouth once daily  BP today: 109/64 Prior BP: 149/83 (08/24/2009)  Labs Reviewed: K+: 4.3 (08/24/2009) Creat: : 0.78 (08/24/2009)   Chol: 139 (08/24/2009)   HDL: 41 (08/24/2009)   LDL: 75 (08/24/2009)   TG: 114 (08/24/2009)  Complete Medication List: 1)  Altace 2.5 Mg Caps (Ramipril) .... Take 1 tablet by mouth once a day 2)  Bayer Aspirin Ec Low Dose 81 Mg Tbec (Aspirin) .... Take 1 tablet by mouth once a day 3)  Actos 45 Mg Tabs (Pioglitazone hcl) .... Take 1 tablet by mouth once a day 4)  Lipitor 10 Mg Tabs (Atorvastatin calcium) .... 1/2 tablet a day 5)  Omeprazole 20 Mg Cpdr (Omeprazole) .... Take 1 tablet by mouth once a day 6)  Metformin Hcl 1000 Mg Tabs (Metformin hcl) .Marland Kitchen.. 1 tab by mouth bid 7)  Pristiq 100 Mg Xr24h-tab (Desvenlafaxine succinate) .... One tab by mouth once daily 8)  Hydrochlorothiazide 12.5 Mg Caps (Hydrochlorothiazide) .Marland Kitchen.. 1 cap by mouth once daily 9)  Fluticasone Propionate 50 Mcg/act Susp (Fluticasone propionate) .Marland Kitchen.. 1 spray each nostril twice daily 10)  Lortab 5 5-500 Mg Tabs (Hydrocodone-acetaminophen) .... One by mouth two times a day prn 11)  Ventolin Hfa 108 (90 Base) Mcg/act Aers (Albuterol sulfate) .Marland Kitchen.. 1-2 puffs every 3-4 hours as needed shortness of breath 12)  Gabapentin  300 Mg Caps (Gabapentin) .... 2 capsules by mouth qhs 13)  Methocarbamol 500 Mg Tabs (Methocarbamol) .Marland Kitchen.. 1 tab by mouth at bedtime as needed back pain 14)  Victoza 18 Mg/32ml Soln (Liraglutide) .... 1.2 mg subcutaneous injection once daily with breakfast 15)  Novofine 32g X 6 Mm Misc (Insulin pen needle) .... Use as directed for daily injections  Patient Instructions: 1)  EKG today for heart palpitations. 2)  Get BMP, CBC and TSH today for heart palptiatons. 3)  Will get you back in with [facility] cardiology for f/u. 4)  Change diabetes regimen to: 5)  PLAIN metformin 1000 mg two times a day + Actos once a day and add VICTOZA 0.6 mg [place] injection once daily for the first 2 wks then go up to 1.2 mg [place] injection once daily. 6)  F/U with  sugar readings/ wt check in 4 wks.   Prescriptions: NOVOFINE 32G X 6 MM MISC (INSULIN PEN NEEDLE) use as directed for daily injections  #30 x 3   Entered and Authorized by:   Seymour Bars DO   Signed by:   Seymour Bars DO on 11/25/2009   Method used:   Electronically to        [facility] #[id number]* (retail)       [address] [place], [place]  [address]       Ph: [phone]       Fax: [phone]   RxID:   [id number] VICTOZA 18 MG/3ML SOLN (LIRAGLUTIDE) 1.2 mg subcutaneous injection once daily with breakfast  #1 box x 1   Entered and Authorized by:   Seymour Bars DO   Signed by:   Seymour Bars DO on 11/25/2009   Method used:   Electronically to        [facility] #[id number]* (retail)       [address] [place], [place]  [address]       Ph: [phone]       Fax: [phone]   RxID:   [id number] METFORMIN HCL 1000 MG TABS (METFORMIN HCL) 1 tab by mouth bid  #60 x 3   Entered and Authorized by:   Seymour Bars DO   Signed by:   Seymour Bars DO on 11/25/2009   Method used:   Electronically to        [facility] #[id number]* (retail)       [address] [place], [place]  [address]       Ph: [phone]        Fax: [phone]   RxID:   [id number]   Laboratory Results   Blood Tests     HGBA1C: 7.3%   (Normal Range: Non-Diabetic - 3-6%   Control Diabetic - 6-8%)

## 2010-11-22 NOTE — Letter (Signed)
Summary: [facility]   Imported By: Lanelle Bal 07/19/2010 11:11:27  _____________________________________________________________________  External Attachment:    Type:   Image     Comment:   External Document

## 2010-11-22 NOTE — Progress Notes (Signed)
Summary: Sugar readings  Phone Note Call from Patient   Summary of Call: Pt calls stating you asked her to call w/ AM fasting sugars x 1 week.  Sugar readings-   149   135   157   145   131   132   123   121   Initial call taken by: Payton Spark CMA,  December 31, 2009 11:41 AM  Follow-up for Phone Call        I am going to continue current meds but will consider changing her ACTOS to a low dose of Amaryl once daily if AM fastings stay > 130.  I am hopeful that her weight loss will help bring them down. Follow-up by: Seymour Bars DO,  December 31, 2009 11:49 AM  Additional Follow-up for Phone Call Additional follow up Details #1::        Pt aware Additional Follow-up by: Payton Spark CMA,  December 31, 2009 12:33 PM

## 2010-11-22 NOTE — Letter (Signed)
Summary: Theresa Scott   Imported By: Lanelle Bal 06/25/2009 12:33:05  _____________________________________________________________________  External Attachment:    Type:   Image     Comment:   External Document

## 2010-11-22 NOTE — Progress Notes (Signed)
Summary: Pristiq  Phone Note Call from Patient Call back at Home Phone [phone]   Caller: Patient Call For: Theresa Bars DO Summary of Call: Pt can not afford the Pristiq at pharmacy it was 240.00 The place where she gets it free she wont get it for 7-10 days. We dont have any samples to give her. She took her last one yesterday. Will she be ok without for this period of time. Initial call taken by: Kathlene November,  July 15, 2010 9:56 AM  Follow-up for Phone Call        can we call our pristiq rep for samples? Follow-up by: Theresa Bars DO,  July 15, 2010 11:38 AM  Additional Follow-up for Phone Call Additional follow up Details #1::        Theresa Duster do you know who this rep would be- cause I dont Additional Follow-up by: Kathlene November,  July 15, 2010 12:04 PM    Additional Follow-up for Phone Call Additional follow up Details #2::    Medication has arrived Follow-up by: Payton Spark CMA,  July 15, 2010 1:26 PM

## 2010-11-22 NOTE — Progress Notes (Signed)
Summary: SURGICAL CLEARANCE  Phone Note From Other Clinic   Caller: Nurse Summary of Call: PT NEEDS CLEARANCE AND EKG FOR PT TO HAVE A HYSTEROSCOPY PROCEDURE ON 2/16. OFC [phone] FAX [phone]. ATTN LISA. Initial call taken by: Edman Circle,  December 06, 2009 9:43 AM     Appended Document: SURGICAL CLEARANCE OK for surgery, just take metoprolol as outlined in note around time of surgery to decrease risk of peri-op AVNRT.

## 2010-11-22 NOTE — Letter (Signed)
Summary: Generic Letter  [facility]  [address], [address]   [place], [place] [address]   Phone: [phone]  Fax: [phone]    04/21/2010  [facility] [address] [place], [place]  [address]  To [facility],  I am writing this letter in support of Ms Theresa Scott wish for gastric bypass surgery.  She has struggled with morbid obesity for years and has failed to sustain any weight loss efforts as our patient for the past three years, never dropping her BMI below 40 through medication changes, [facility], diabetic diet (seen by a nutritionist), or a trial of Victoza.  In addition to her obesity, she has the co-morbidities of Type II Diabetes, Hypertension, Coronary artery disease, hyperlipidemia, DJD, Chronic back pain and GERD.    Todd has had the oppurtunity to watch her husband go through gastric bypass this past year and knows what to expect.           Sincerely,   Seymour Bars DO

## 2010-11-22 NOTE — Letter (Signed)
Summary: [facility]  [facility]   Imported By: Lanelle Bal 03/28/2010 09:30:30  _____________________________________________________________________  External Attachment:    Type:   Image     Comment:   External Document

## 2010-11-22 NOTE — Progress Notes (Signed)
Summary: Film/video editor Note Call from Patient   Caller: Patient Summary of Call: Ordered Lipitor and Pristiq from [employer]. Pt ID # [id number] order # [id number] Initial call taken by: Payton Spark CMA,  July 11, 2010 9:27 AM

## 2010-11-22 NOTE — Progress Notes (Signed)
Summary: C/O heavy cp  Phone Note Call from Patient Call back at Home Phone [phone]   Caller: Patient Reason for Call: Talk to Nurse Summary of Call: c/o heavy chestpain around neck, pulse 80, b/p was not check.  Initial call taken by: Lorne Skeens,  January 24, 2010 4:36 PM  Follow-up for Phone Call        I spoke with the pt and she has been having CP for a couple of hours.  The pt describes this as a heaviness in her chest, "feels like a ton of bricks".  The pt does have pain radiating in her left shoulder, shoulder blade and neck. The pt woke up nauseated this morning and has felt nauseated during the day.  The pt has taken 2 NTG and this eased her pain a little and then the pain came back. BP 117/64 pulse 73 at this time.  With the pt currently having active chest pain I instructed her to call EMS for transportation to the hospital.  Pt agrees with plan.  Ward Givens notified that pt is coming to the ER.  Follow-up by: Julieta Gutting, RN, BSN,  January 24, 2010 4:43 PM

## 2010-11-22 NOTE — Progress Notes (Signed)
Summary: need surgical clearnace form filled out.  Phone Note Call from Patient Call back at [facility] Phone [phone]   Caller: Patient Summary of Call: Pt need a surgiacl clearance form Initial call taken by: Judie Grieve,  June 01, 2010 3:02 PM  Follow-up for Phone Call        talked with pt by telephone--Dr Ovidio Kin requesting clearance for lap-band surgery will fax Dr Alford Highland 5/18/11office note stating that she does not require  further cardiac work-up prior to surgery

## 2010-11-22 NOTE — Progress Notes (Signed)
  Prescriptions: ACTOS 45 MG TABS (PIOGLITAZONE HCL) Take 1 tablet by mouth once a day  #90.0 Each x 3   Entered and Authorized by:   Seymour Bars DO   Signed by:   Seymour Bars DO on 06/08/2009   Method used:   Print then Give to Patient   RxID:   [id number]

## 2010-11-22 NOTE — Consult Note (Signed)
Summary: [facility]  [facility]   Imported By: Lanelle Bal 12/28/2008 10:50:27  _____________________________________________________________________  External Attachment:    Type:   Image     Comment:   External Document

## 2010-11-22 NOTE — Letter (Signed)
Summary: [facility]   Imported By: Lanelle Bal 07/04/2010 09:29:05  _____________________________________________________________________  External Attachment:    Type:   Image     Comment:   External Document

## 2010-11-22 NOTE — Progress Notes (Signed)
Summary: how many calories?/  Phone Note Outgoing Call   Call placed by: Kandice Hams,  November 25, 2009 2:12 PM Call placed to: Patient [phone] Summary of Call: pt informed to followup with Dr Riley Kill, pt has question. How many calories should I take in a day?  Starting victoza tomorrow Initial call taken by: Kandice Hams,  November 25, 2009 2:12 PM  Follow-up for Phone Call        She needs to aim for 1400 kcal/ day. Follow-up by: Seymour Bars DO,  November 25, 2009 2:57 PM     Appended Document: how many calories?/ PT INFORMED

## 2010-11-22 NOTE — Medication Information (Signed)
Summary: Approval for Patient Assistance Program/[employer]  Approval for Patient Assistance Program/[employer]   Imported By: Lanelle Bal 07/07/2009 10:18:12  _____________________________________________________________________  External Attachment:    Type:   Image     Comment:   External Document

## 2010-11-22 NOTE — Letter (Signed)
Summary: [facility]  [facility]   Imported By: Lanelle Bal 03/09/2009 14:14:50  _____________________________________________________________________  External Attachment:    Type:   Image     Comment:   External Document

## 2010-11-22 NOTE — Medication Information (Signed)
Summary: Approval for Patient Assistance Program/[employer]  Approval for Patient Assistance Program/[employer]   Imported By: Lanelle Bal 12/22/2009 08:22:19  _____________________________________________________________________  External Attachment:    Type:   Image     Comment:   External Document

## 2010-11-22 NOTE — Progress Notes (Signed)
Summary: Question  Phone Note Call from Patient Call back at Home Phone [phone]   Caller: Patient Call For: michelle Summary of Call: pt calls and wants to know if you had received the "papers" last week and if she had any meds here? Initial call taken by: Kathlene November,  April 11, 2010 12:47 PM  Follow-up for Phone Call        [facility] informing Pt that lipitor for husband is here Follow-up by: Payton Spark CMA,  April 12, 2010 8:42 AM

## 2010-11-22 NOTE — Assessment & Plan Note (Signed)
Summary: f/u cath done 02-14-10/sl   Primary Provider:  Seymour Bars DO  CC:  follow up to catheterization.Marland Kitchen  History of Present Illness: 55 yo with history of AVNRT ablation, obesity, HTN presents for followup of chest pain.  Patient had an episode of severe chest pressure that was prolonged, resulting in admission to [facility] and where she was ruled out for MI with negative enzymes.  ECG was unremarkable.  Lexiscan myoview was done, showing EF 58% and a small, mild mid anterior reversible perfusion defect.  This was thought to be a low risk study with possible shifting breast attenuation.  Patient was sent home but had 3 more episodes of central chest pressure after discharge, each lasting from 30 minutes to 2 hours.    Given ongoing chest pain episodes and perfusion defect on myoview, I did a left heart cath which showed no angiographic coronary disease and normal EF.  She has had no significant chest pain since that time.   Labs (11/10): LDL 75, HDL 41 Labs (2/11): TSH normal, creatinine 0.63 Labs (4/11): creatinine 0.73, LFTs normal, cardiac enzymes negative, lipase/amylase normal.       Current Medications (verified): 1)  Altace 2.5 Mg Caps (Ramipril) .... Once Daily 2)  Bayer Aspirin Ec Low Dose 81 Mg Tbec (Aspirin) .... Take 1 Tablet By Mouth Once A Day 3)  Actos 45 Mg Tabs (Pioglitazone Hcl) .... Take 1 Tablet By Mouth Once A Day 4)  Lipitor 10 Mg Tabs (Atorvastatin Calcium) .... 1/2 Tablet A Day 5)  Omeprazole 20 Mg Cpdr (Omeprazole) .... Take 1 Tablet By Mouth Once A Day 6)  Metformin Hcl 1000 Mg Tabs (Metformin Hcl) .Marland Kitchen.. 1 Tab By Mouth Bid 7)  Pristiq 100 Mg Xr24h-Tab (Desvenlafaxine Succinate) .... One Tab By Mouth Once Daily 8)  Hydrochlorothiazide 12.5 Mg Caps (Hydrochlorothiazide) .Marland Kitchen.. 1 Cap By Mouth Once Daily 9)  Lortab 5 5-500 Mg Tabs (Hydrocodone-Acetaminophen) .... One By Mouth Two Times A Day Prn 10)  Ventolin Hfa 108 (90 Base) Mcg/act Aers (Albuterol Sulfate) .Marland Kitchen..  1-2 Puffs Every 3-4 Hours As Needed Shortness of Breath 11)  Victoza 18 Mg/91ml Soln (Liraglutide) .... 1.8 Mg [place] in The Evening 12)  Multivitamins   Tabs (Multiple Vitamin) .... Once Daily 13)  Pen Needles 5/16" 31g X 8 Mm Misc (Insulin Pen Needle) .... Use Once Daily As Directed 14)  Amitriptyline Hcl 25 Mg Tabs (Amitriptyline Hcl) .... Once Daily 15)  Metoprolol Tartrate 25 Mg Tabs (Metoprolol Tartrate) .... Take One Tablet By Mouth Twice A Day  Allergies (verified): 1)  Claritin  Past History:  Past Medical History: 1. HYPERTENSION (ICD-401.9) 2. BACK PAIN, CHRONIC (ICD-724.5) - Sees ortho and they write for pain meds. History of posterior spinal fusion.  3. INSOMNIA (ICD-780.52) 4. GERD (ICD-530.81) 5. HYPERLIPIDEMIA (ICD-272.4) 6. OSTEOARTHRITIS (ICD-715.90) 7. NEPHROLITHIASIS, HX OF (ICD-V13.01) - sees urology 8. DIABETES MELLITUS, TYPE II (ICD-250.00) 9. DEPRESSION (ICD-311) 10. Varicose veins 11. 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  Event monitor in 2/11 done for palpitations showed only PACs.  13. Left heart cath in 5/08: No angiographic CAD, EF 60%.  13. Obesity 14. Chest pain: Lexiscan myoview (4/11) with EF 58%, normal wall motion, small mild reversible mid anterior defect thought to possibly be due to shifting breast attenuation.  Low risk study.  Patient continued to have ches pain so underwent LHC (4/11) showing no angiographic CAD and EF 60%.   Family History: Reviewed history  from 06/18/2008 and no changes required. Family History Diabetes Family History Hypertension Family History of Alcoholism - mother Family History of CAD Female   Social History: Reviewed history from 12/01/2009 and no changes required. 2 children (one biologic -in their 30's and one adopted who is 55 yo) Occupation:disabled Married Former smoker  Vital Signs:  Patient profile:   55 year old female Height:      67  inches Weight:      260 pounds BMI:     40.87 Pulse rate:   64 / minute Pulse rhythm:   regular BP sitting:   106 / 62  (left arm) Cuff size:   large  Vitals Entered By: Judithe Modest CMA (Mar 09, 2010 10:27 AM)  Physical Exam  General:  alert, well-developed, well-nourished, and well-hydrated.  morbidly obese Neck:  Neck supple, no JVD. No masses, thyromegaly or abnormal cervical nodes. Lungs:  Clear bilaterally to auscultation and percussion. Heart:  Non-displaced PMI, chest non-tender; regular rate and rhythm, S1, S2 without murmurs, rubs or gallops. Carotid upstroke normal, no bruit.  Pedals normal pulses. No edema, no varicosities.  2+ right radial pulse.  Abdomen:  Bowel sounds positive; abdomen soft and non-tender without masses, organomegaly, or hernias noted. No hepatosplenomegaly. Extremities:  No clubbing or cyanosis. Neurologic:  Alert and oriented x 3. Psych:  Normal affect.   Impression & Recommendations:  Problem # 1:  CHEST PAIN (ICD-786.50) Suspect noncardiac.  Myoview defect was likely attenuation due to shifting breast artifact.  Right radial artery cath site is benign.  No further cardiac workup needed.   Problem # 2:  OBESITY (ICD-278.00) Patient continues to gain weight.  She is interested in gastric bypass.  I think either this or lap banding would be reasonable for her.  She would need to undergo no further cardiac workup prior to a surgery.   Patient Instructions: 1)  Your physician recommends that you schedule a follow-up appointment as needed with Dr Shirlee Latch.

## 2010-11-22 NOTE — Consult Note (Signed)
Summary: [facility]  [facility]   Imported By: Lanelle Bal 07/03/2008 13:52:18  _____________________________________________________________________  External Attachment:    Type:   Image     Comment:   External Document

## 2010-11-22 NOTE — Assessment & Plan Note (Signed)
Summary: 1 MONTH ROV/SL   Primary Provider:  Seymour Bars DO  CC:  1 month rov.  History of Present Illness: 55 yo with history of AVNRT ablation, obesity, HTN presents for evaluation of palpitations.  Patient had AVNRT ablation in 2000.  For the last 6 months, she has had brief episodes where she feels her heart racing.  These last < 1 minute. Pulse is going too fast to count.  She does not get lightheaded and has not had syncope.  Episodes feel like prior AVNRT, they just don't last as long.  Nothing in particular brings them on and they occur about once a week.  Patient gets occasional chest heaviness, this is not related to either her palpitations or to exertion.  No significant DOE.  She walks on the treadmill at the [facility] for exercise.  She does report some pain in her calves bilaterally with walking.  This has been going on for a number of months.   Given the patient's symptoms, I gave her a 3 week event monitor which has so far shown no SVT.  She has had only occasional PACs.  Echo showed preserved EF and normal valves.  Arterial dopplers done because of calf pain were negative for significant PAD.   Labs (11/10): LDL 75, HDL 41 Labs (2/11): TSH normal, creatinine 0.63      Current Medications (verified): 1)  Altace 2.5 Mg Caps (Ramipril) .... Take 1 Tablet By Mouth Once A Day 2)  Bayer Aspirin Ec Low Dose 81 Mg Tbec (Aspirin) .... Take 1 Tablet By Mouth Once A Day 3)  Actos 45 Mg Tabs (Pioglitazone Hcl) .... Take 1 Tablet By Mouth Once A Day 4)  Lipitor 10 Mg Tabs (Atorvastatin Calcium) .... 1/2 Tablet A Day 5)  Omeprazole 20 Mg Cpdr (Omeprazole) .... Take 1 Tablet By Mouth Once A Day 6)  Metformin Hcl 1000 Mg Tabs (Metformin Hcl) .Marland Kitchen.. 1 Tab By Mouth Bid 7)  Pristiq 100 Mg Xr24h-Tab (Desvenlafaxine Succinate) .... One Tab By Mouth Once Daily 8)  Hydrochlorothiazide 12.5 Mg Caps (Hydrochlorothiazide) .Marland Kitchen.. 1 Cap By Mouth Once Daily 9)  Lortab 5 5-500 Mg Tabs  (Hydrocodone-Acetaminophen) .... One By Mouth Two Times A Day Prn 10)  Ventolin Hfa 108 (90 Base) Mcg/act Aers (Albuterol Sulfate) .Marland Kitchen.. 1-2 Puffs Every 3-4 Hours As Needed Shortness of Breath 11)  Gabapentin 300 Mg Caps (Gabapentin) .... 2 Capsules By Mouth Qhs 12)  Victoza 18 Mg/2ml Soln (Liraglutide) .... 1.8 Mg [place] in The Evening 13)  Multivitamins   Tabs (Multiple Vitamin) .... Once Daily 14)  Pen Needles 5/16" 31g X 8 Mm Misc (Insulin Pen Needle) .... Use Once Daily As Directed 15)  Amitriptyline Hcl 25 Mg Tabs (Amitriptyline Hcl) .... Once Daily  Allergies (verified): 1)  Claritin  Family History: Reviewed history from 06/18/2008 and no changes required. Family History Diabetes Family History Hypertension Family History of Alcoholism - mother Family History of CAD Female   Social History: Reviewed history from 12/01/2009 and no changes required. 2 children (one biologic -in their 55's and one adopted who is 55 yo) Occupation:disabled Married Former smoker  Review of Systems       All systems reviewed and negative except as per HPI.   Vital Signs:  Patient profile:   55 year old female Height:      67 inches Weight:      270 pounds Pulse rate:   89 / minute Pulse rhythm:   regular BP sitting:  111 / 63  (left arm) Cuff size:   large  Vitals Entered By: Judithe Modest CMA (December 30, 2009 4:19 PM)  Physical Exam  General:  Obese-appearing female in no acute distress. Neck:  Neck supple, no JVD. No masses, thyromegaly or abnormal cervical nodes. Lungs:  Clear to auscultation bilaterally. Normal work of breathing.  Heart:  Non-displaced PMI, chest non-tender; regular rate and rhythm, S1, S2 without murmurs, rubs or gallops. Carotid upstroke normal, no bruit.  Pedals normal pulses. 1+ edema 1/2 up bilaterally.  Abdomen:  Bowel sounds positive; abdomen soft and non-tender without masses, organomegaly, or hernias noted. No hepatosplenomegaly. Extremities:  No clubbing or  cyanosis. Neurologic:  Alert and oriented x 3. Psych:  Normal affect.   Impression & Recommendations:  Problem # 1:  PSVT (ICD-427.0) Patient has history of AVNRT s/p ablation.  She is now feeling runs of tachycardia (under 1 minute at a time) reminiscent of her AVNRT.  ECG showed an abnormal P wave axis. However, 3 week event monitor has so far shown only some PACs.  I will have her start metoprolol 25 mg two times a day given significantly symptomatic palpitations and history of SVT.  Echo showed a structurally normal heart.   Problem # 2:  CLAUDICATION (ICD-443.9) Normal arterial dopplers.  No evidence for PAD.   Patient Instructions: 1)  Your physician recommends that you schedule a follow-up appointment in: 6 months with Dr. Shirlee Latch. 2)  Your physician has recommended you make the following change in your medication: start Metoprolol tartrate 25 mg twice a day. Prescriptions: METOPROLOL TARTRATE 25 MG TABS (METOPROLOL TARTRATE) Take one tablet by mouth twice a day  #180 x 4   Entered by:   Minerva Areola, RN, BSN   Authorized by:   Marca Ancona, MD   Signed by:   Minerva Areola, RN, BSN on 12/30/2009   Method used:   Electronically to        [id number]* (retail)       [address] [place], [place]  [address]       Ph: [phone]       Fax: [phone]   RxID:   [id number]

## 2010-11-22 NOTE — Letter (Signed)
Summary: Records from [facility]  Records from [facility]   Imported By: Lanelle Bal 08/26/2008 15:56:46  _____________________________________________________________________  External Attachment:    Type:   Image     Comment:   External Document

## 2010-11-22 NOTE — Progress Notes (Signed)
Summary: FYI Surgery scheduled 10/31  Phone Note Call from Patient   Caller: FYI Surgery scheduled 10/31 Initial call taken by: Payton Spark CMA,  August 10, 2010 4:55 PM

## 2010-11-22 NOTE — Assessment & Plan Note (Signed)
Summary: np6/ PALPS, PT HAS SECURE HORIZONE/ GD   Visit Type:  np6 Primary Provider:  Seymour Bars DO  CC:  palpitations.  History of Present Illness: 55 yo with history of AVNRT ablation, obesity, HTN presents for evaluation of palpitations.  Patient had AVNRT ablation in 2000.  For the last 6 months, she has had brief episodes where she feels her heart racing.  These last < 1 minute. Pulse is going too fast to count.  She does not get lightheaded and has not had syncope.  Episodes feel like prior AVNRT, they just don't last as long.  Nothing in particular brings them on and they occur about once a week.  Patient gets occasional chest heaviness, this is not related to either her palpitations or to exertion.  No significant DOE.  She walks on the treadmill at the [facility] for exercise.  She does report some pain in her calves bilaterally with walking.  This has been going on for a number of months.   She is undergoing workup for possible cervical mass and needs to undergo a GYN procedure in about a week.    ECG: NSR, unusual P wave axis (isoelectric to negative in leads II, III, and AVF).   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) 4)  Bronchitis, Chronic  (ICD-491.9) 5)  Allergic Rhinitis  (ICD-477.9) 6)  Reactive Airway Disease  (ICD-493.90) 7)  Peripheral Edema  (ICD-782.3) 8)  Diabetic Peripheral Neuropathy  (ICD-250.60) 9)  Nonspec Elevation of Levels of Transaminase/ldh  (ICD-790.4) 10)  Obesity  (ICD-278.00) 11)  Encounter For Long-term Use of Other Medications  (ICD-V58.69) 12)  Dark Urine  (ICD-788.69) 13)  Incontinence  (ICD-788.30) 14)  Peripheral Vascular Disease  (ICD-443.9) 15)  Proteinuria  (ICD-791.0) 16)  Hypertension  (ICD-401.9) 17)  Back Pain, Chronic  (ICD-724.5) 18)  Insomnia  (ICD-780.52) 19)  Gerd   (ICD-530.81) 20)  Hyperlipidemia  (ICD-272.4) 21)  Osteoarthritis  (ICD-715.90) 22)  Nephrolithiasis, Hx of  (ICD-V13.01) 23)  Diabetes Mellitus, Type II  (ICD-250.00) 24)  Depression  (ICD-311)  Current Medications (verified): 1)  Altace 2.5 Mg Caps (Ramipril) .... Take 1 Tablet By Mouth Once A Day 2)  Bayer Aspirin Ec Low Dose 81 Mg Tbec (Aspirin) .... Take 1 Tablet By Mouth Once A Day 3)  Actos 45 Mg Tabs (Pioglitazone Hcl) .... Take 1 Tablet By Mouth Once A Day 4)  Lipitor 10 Mg Tabs (Atorvastatin Calcium) .... 1/2 Tablet A Day 5)  Omeprazole 20 Mg Cpdr (Omeprazole) .... Take 1 Tablet By Mouth Once A Day 6)  Metformin Hcl 1000 Mg Tabs (Metformin Hcl) .... Once Daily 7)  Pristiq 100 Mg Xr24h-Tab (Desvenlafaxine Succinate) .... One Tab By Mouth Once Daily 8)  Hydrochlorothiazide 12.5 Mg Caps (Hydrochlorothiazide) .Marland Kitchen.. 1 Cap By Mouth Once Daily 9)  Fluticasone Propionate 50 Mcg/act Susp (Fluticasone Propionate) .... As Needed 10)  Lortab 5 5-500 Mg Tabs (Hydrocodone-Acetaminophen) .... One By Mouth Two Times A Day Prn 11)  Ventolin Hfa 108 (90 Base) Mcg/act Aers (Albuterol Sulfate) .Marland Kitchen.. 1-2 Puffs Every 3-4 Hours As Needed Shortness of Breath 12)  Gabapentin 300 Mg Caps (Gabapentin) .... 2 Capsules By Mouth Qhs 13)  Victoza 18 Mg/42ml Soln (Liraglutide) .... 1.2 Mg Subcutaneous Injection Once Daily With Breakfast 14)  Multivitamins   Tabs (Multiple Vitamin) .Marland KitchenMarland KitchenMarland Kitchen  For Women Once Daily 15)  Metoprolol Tartrate 25 Mg Tabs (Metoprolol Tartrate) .... One Tablet Twice A Day The Day Before and The Day of The Procedure  Allergies: 1)  Claritin  Past History:  Past Medical History: 1. HYPERTENSION (ICD-401.9) 2. BACK PAIN, CHRONIC (ICD-724.5) - Sees ortho and they write for pain meds. History of posterior spinal fusion.  3. INSOMNIA (ICD-780.52) 4. GERD (ICD-530.81) 5. HYPERLIPIDEMIA (ICD-272.4) 6. OSTEOARTHRITIS (ICD-715.90) 7. NEPHROLITHIASIS, HX OF (ICD-V13.01) - sees urology 8. DIABETES  MELLITUS, TYPE II (ICD-250.00) 9. DEPRESSION (ICD-311) 10. Varicose veins 11. 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 Left heart cath in 5/08: No angiographic CAD, EF 60%.  13. Obesity  Family History: Reviewed history from 06/18/2008 and no changes required. Family History Diabetes Family History Hypertension Family History of Alcoholism - mother Family History of CAD Female   Social History: 2 children (one biologic -in their 89's and one adopted who is 55 yo) Occupation:disabled Married Former smoker  Review of Systems       All systems reviewed and negative except as per HPI.   Vital Signs:  Patient profile:   55 year old female Height:      67 inches Weight:      278 pounds BMI:     43.70 Pulse rate:   79 / minute BP sitting:   111 / 68  (left arm) Cuff size:   large  Vitals Entered By: Oswald Hillock (December 01, 2009 12:05 PM)  Physical Exam  General:  Well developed, well nourished, in no acute distress. Obese.  Head:  normocephalic and atraumatic Nose:  no deformity, discharge, inflammation, or lesions Mouth:  Teeth, gums and palate normal. Oral mucosa normal. Neck:  Neck supple, no JVD. No masses, thyromegaly or abnormal cervical nodes. Lungs:  Clear bilaterally to auscultation and percussion. Heart:  Non-displaced PMI, chest non-tender; regular rate and rhythm, S1, S2 without murmurs, rubs or gallops. Carotid upstroke normal, no bruit.  2+ edema 1/2 up lower legs bilaterally. Pulses in feet difficult to palpate.  Abdomen:  Bowel sounds positive; abdomen soft and non-tender without masses, organomegaly, or hernias noted. No hepatosplenomegaly. Msk:  Back normal, normal gait. Muscle strength and tone normal. Extremities:  No clubbing or cyanosis. Neurologic:  Alert and oriented x 3. Skin:  Intact without lesions or rashes. Psych:  Normal affect.   Impression & Recommendations:  Problem # 1:   PSVT (ICD-427.0) Patient has history of AVNRT s/p ablation.  She is now feeling runs of tachycardia (under 1 minute at a time) reminiscent of her AVNRT.  ECG shows an abnormal P wave axis.  I will set her up for a 3 week event monitor.  She is going to have an outpatient GYN procedure under anesthesia in about a week.  I would like her to take a low dose of metoprolol 25 mg by mouth two times a day the day before and the day of the procedure to decrease risk of peri-procedural SVT.  I will not have her on standing metoprolol as I do not want to mask arrhythmias while she is wearing the event monitor.    Problem # 2:  EDEMA Patient has significant lower extremity edema.  This could be due to venous insufficiency.  She does not have much exertional shortness of breath.  JVP is difficult given thick neck but does not appear elevated.  I will have her do an echocardiogram to make sure  that LV function is preserved.    Problem # 3:  CLAUDICATION (ICD-443.9) Patient reports calf pain with ambulation and pulses are difficult to palpate.  I will have her do lower extremity arterial dopplers to assess for PAD.   Problem # 4:  HYPERLIPIDEMIA (ICD-272.4) Check lipids/LFTs (none recently).   Other Orders: EKG w/ Interpretation (93000) Arterial Duplex Lower Extremity (Arterial Duplex Low) Echocardiogram (Echo) Event (Event)  Patient Instructions: 1)  Your physician has requested that you have an echocardiogram.  Echocardiography is a painless test that uses sound waves to create images of your heart. It provides your doctor with information about the size and shape of your heart and how well your heart's chambers and valves are working.  This procedure takes approximately one hour. There are no restrictions for this procedure. 2)  Your physician has requested that you have a lower or upper extremity arterial duplex.  This test is an ultrasound of the arteries in the legs or arms.  It looks at arterial blood  flow in the legs and arms.  Allow one hour for Lower and Upper Arterial scans. There are no restrictions or special instructions. 3)  Your physician has recommended that you wear an event monitor.  Event monitors are medical devices that record the heart's electrical activity. Doctors most often use these monitors to diagnose arrhythmias. Arrhythmias are problems with the speed or rhythm of the heartbeat. The monitor is a small, portable device. You can wear one while you do your normal daily activities. This is usually used to diagnose what is causing palpitations/syncope (passing out). 4)  Your physician has recommended that you take Metoprolol 25mg  twice a day the day before and the day of the procedure next week--this will be a total of 4 doses 5)  Your physician recommends that you schedule a follow-up appointment with Dr Marca Ancona in 4-5 weeks after your testing has been completed.  Prescriptions: METOPROLOL TARTRATE 25 MG TABS (METOPROLOL TARTRATE) one tablet twice a day the day before and the day of the procedure  #4 x 0   Entered by:   Katina Dung, RN, BSN   Authorized by:   Marca Ancona, MD   Signed by:   Katina Dung, RN, BSN on 12/01/2009   Method used:   Electronically to        [facility] #[id number]* (retail)       [address] [place], [place]  [address]       Ph: [phone]       Fax: [phone]   RxID:   [id number]

## 2010-11-22 NOTE — Miscellaneous (Signed)
  Clinical Lists Changes 

## 2010-11-22 NOTE — Progress Notes (Signed)
Summary: NEEDS A REFILL ON ACTOS 45MG   Phone Note Call from Patient   Caller: Patient Call For: Paulo Fruit MD Reason for Call: Refill Medication Details for Reason: NEEDS A MED REFILL  Summary of Call: PATIENT CALLED AND SAID THAT SHE NEEDS A REFILL ON ACTOS 45MG  1TAB, by mouth 1DAY  CALL INTO THE PHARMACY: [facility] ON [address] Initial call taken by: Michaelle Copas,  June 22, 2008 10:49 AM  Follow-up for Phone Call        Med refilled.  Also, talked to patient about her labwork and need to increase her glyburide/metformin to two times a day.  That med is sent to pharmacy for updated refill.   Follow-up by: Paulo Fruit MD,  June 22, 2008 2:24 PM    New/Updated Medications: GLYBURIDE-METFORMIN 5-500 MG TABS (GLYBURIDE-METFORMIN) Take 1 tablet by mouth two times a day   Prescriptions: GLYBURIDE-METFORMIN 5-500 MG TABS (GLYBURIDE-METFORMIN) Take 1 tablet by mouth two times a day  #60 x 1   Entered and Authorized by:   Paulo Fruit MD   Signed by:   Paulo Fruit MD on 06/22/2008   Method used:   Electronically to        [facility] [address] #[id number]* (retail)       [address] [place], [place]  [address]       Ph: [phone]       Fax: [phone]   RxID:   [id number] ACTOS 45 MG TABS (PIOGLITAZONE HCL) Take 1 tablet by mouth once a day  #90 x 1   Entered and Authorized by:   Paulo Fruit MD   Signed by:   Paulo Fruit MD on 06/22/2008   Method used:   Electronically to        [facility] [address] #[id number]* (retail)       [address] [place], [place]  [address]       Ph: [phone]       Fax: [phone]   RxID:   [id number]

## 2010-11-22 NOTE — Assessment & Plan Note (Signed)
Summary: f/u DM   Vital Signs:  Patient profile:   55 year old female Height:      67 inches Weight:      263 pounds BMI:     41.34 O2 Sat:      95 % on Room air Pulse rate:   61 / minute BP sitting:   95 / 65  (left arm) Cuff size:   large  Vitals Entered By: Payton Spark CMA (Mar 01, 2010 9:54 AM)  O2 Flow:  Room air CC: F/U DM. Also of dysuria x 2 days.   Primary Care Provider:  Seymour Bars DO  CC:  F/U DM. Also of dysuria x 2 days.Marland Kitchen  History of Present Illness: 55 yo WF presents for problems with incomplete voiding and dysuria x 5 days.  No fevers, chills, abdominal or flank pain.  Denies hematuria.  Denies N/v.  She ran out of Victoza 2 wks ago.  She cannot afford the RX, so has been getting samples here.  She is also on Actos + Metformin for her DM.  her fasting sugars are running in the 110s all the way up to the 160s.  She has not been exercising but has been trying to eat healthy.  She is considering gastric bypass like her husband.  She just had a heart cath that was normal after recently being hospitalized for chest pain and had an abnormal myoview test.  Dr Shirlee Latch has her stable on her current meds.    Current Medications (verified): 1)  Altace 2.5 Mg Caps (Ramipril) .... Take 1 Tablet By Mouth Once A Day 2)  Bayer Aspirin Ec Low Dose 81 Mg Tbec (Aspirin) .... Take 1 Tablet By Mouth Once A Day 3)  Actos 45 Mg Tabs (Pioglitazone Hcl) .... Take 1 Tablet By Mouth Once A Day 4)  Lipitor 10 Mg Tabs (Atorvastatin Calcium) .... 1/2 Tablet A Day 5)  Omeprazole 20 Mg Cpdr (Omeprazole) .... Take 1 Tablet By Mouth Once A Day 6)  Metformin Hcl 1000 Mg Tabs (Metformin Hcl) .Marland Kitchen.. 1 Tab By Mouth Bid 7)  Pristiq 100 Mg Xr24h-Tab (Desvenlafaxine Succinate) .... One Tab By Mouth Once Daily 8)  Hydrochlorothiazide 12.5 Mg Caps (Hydrochlorothiazide) .Marland Kitchen.. 1 Cap By Mouth Once Daily 9)  Lortab 5 5-500 Mg Tabs (Hydrocodone-Acetaminophen) .... One By Mouth Two Times A Day Prn 10)   Ventolin Hfa 108 (90 Base) Mcg/act Aers (Albuterol Sulfate) .Marland Kitchen.. 1-2 Puffs Every 3-4 Hours As Needed Shortness of Breath 11)  Victoza 18 Mg/62ml Soln (Liraglutide) .... 1.8 Mg [place] in The Evening 12)  Multivitamins   Tabs (Multiple Vitamin) .... Once Daily 13)  Pen Needles 5/16" 31g X 8 Mm Misc (Insulin Pen Needle) .... Use Once Daily As Directed 14)  Amitriptyline Hcl 25 Mg Tabs (Amitriptyline Hcl) .... Once Daily 15)  Metoprolol Tartrate 25 Mg Tabs (Metoprolol Tartrate) .... Take One Tablet By Mouth Twice A Day 16)  Lorazepam 0.5 Mg Tabs (Lorazepam) .Marland Kitchen.. 1 Tab By Mouth Two Times A Day As Needed Anxiety  Allergies (verified): 1)  Claritin  Past History:  Past Medical History: Reviewed history from 02/09/2010 and no changes required. 1. HYPERTENSION (ICD-401.9) 2. BACK PAIN, CHRONIC (ICD-724.5) - Sees ortho and they write for pain meds. History of posterior spinal fusion.  3. INSOMNIA (ICD-780.52) 4. GERD (ICD-530.81) 5. HYPERLIPIDEMIA (ICD-272.4) 6. OSTEOARTHRITIS (ICD-715.90) 7. NEPHROLITHIASIS, HX OF (ICD-V13.01) - sees urology 8. DIABETES MELLITUS, TYPE II (ICD-250.00) 9. DEPRESSION (ICD-311) 10. Varicose veins 11. 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  Event monitor in 2/11 done for palpitations showed only PACs.  13. Left heart cath in 5/08: No angiographic CAD, EF 60%.  13. Obesity 14. Chest pain: Lexiscan myoview (4/11) with EF 58%, normal wall motion, small mild reversible mid anterior defect thought to possibly be due to shifting breast attenuation.  Low risk study.   Past Surgical History: Reviewed history from 08/03/2008 and no changes required. Carpal tunnel release cardiac ablation Lumbar laminectomy Lumbar fusion   Social History: Reviewed history from 12/01/2009 and no changes required. 2 children (one biologic -in their 76's and one adopted who is 55 yo) Occupation:disabled Married Former  smoker  Review of Systems      See HPI  Physical Exam  General:  alert, well-developed, well-nourished, and well-hydrated.  morbidly obese Head:  normocephalic and atraumatic.   Eyes:  pupils equal, pupils round, and pupils reactive to light.   Nose:  no nasal discharge.   Mouth:  pharynx pink and moist.   Neck:  no masses.   Lungs:  Normal respiratory effort, chest expands symmetrically. Lungs are clear to auscultation, no crackles or wheezes. Heart:  Normal rate and regular rhythm. S1 and S2 normal without gallop, murmur, click, rub or other extra sounds. Abdomen:  soft, non-tender, normal bowel sounds, no distention, no masses, and no guarding.  no CVAT or suprapubic TTP Extremities:  1+ bilat LE pitting edema Skin:  color normal.   Cervical Nodes:  No lymphadenopathy noted Psych:  good eye contact, not anxious appearing, and not depressed appearing.     Impression & Recommendations:  Problem # 1:  DYSURIA (ICD-788.1) UA inconclusive.  Urine sent for culture.  Will f/u results.  Orders: T-Culture, Urine ([id number])  Encouraged to push clear liquids, get enough rest, and take acetaminophen as needed. To be seen in 10 days if no improvement, sooner if worse.  Problem # 2:  DIABETES MELLITUS, TYPE II (ICD-250.00) A1C at goal onc current meds.  Continue with samples of Victoza.  We discussed a long term plan given her failed attempts at wt loss.   Her updated medication list for this problem includes:    Altace 1.25 Mg Caps (Ramipril) .Marland Kitchen... 1 capsule by mouth daily    Bayer Aspirin Ec Low Dose 81 Mg Tbec (Aspirin) .Marland Kitchen... Take 1 tablet by mouth once a day    Actos 45 Mg Tabs (Pioglitazone hcl) .Marland Kitchen... Take 1 tablet by mouth once a day    Metformin Hcl 1000 Mg Tabs (Metformin hcl) .Marland Kitchen... 1 tab by mouth bid    Victoza 18 Mg/74ml Soln (Liraglutide) .Marland Kitchen... 1.8 mg [place] in the evening  Orders: Fingerstick (09811) Hemoglobin A1C (83036)  Labs Reviewed: Creat: 0.7 (02/09/2010)     Last  Eye Exam: normal (06/16/2009) Reviewed HgBA1c results: 6.4 (03/01/2010)  7.3 (11/25/2009)  Problem # 3:  HYPERTENSION (ICD-401.9) BP is low.  She is feeling sluggish.  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  I will cut her Altace to 1.25 mg /day.   Her updated medication list for this problem includes:    Altace 1.25 Mg Caps (Ramipril) .Marland Kitchen... 1 capsule by mouth daily    Hydrochlorothiazide 12.5 Mg Caps (Hydrochlorothiazide) .Marland Kitchen... 1 cap by mouth once daily    Metoprolol Tartrate 25 Mg Tabs (Metoprolol tartrate) .Marland Kitchen... Take one tablet by mouth twice a day  BP today: 95/65 Prior  BP: 100/60 (02/09/2010)  Labs Reviewed: K+: 3.7 (02/09/2010) Creat: : 0.7 (02/09/2010)   Chol: 139 (08/24/2009)   HDL: 41 (08/24/2009)   LDL: 75 (08/24/2009)   TG: 114 (08/24/2009)  Problem # 4:  OBESITY (ICD-278.00) BMI 41 with 4 # wt gain (off Victoza).  She has failed to lose wt on her own even after trying to eat small portions and healthy options.  Has CAD, T2DM, HTN and LBP all secondary to her obesity.  Advised her looking into gastric bypass-- she agrees to go to the class on surgery at [facility].  Complete Medication List: 1)  Altace 1.25 Mg Caps (Ramipril) .Marland Kitchen.. 1 capsule by mouth daily 2)  Bayer Aspirin Ec Low Dose 81 Mg Tbec (Aspirin) .... Take 1 tablet by mouth once a day 3)  Actos 45 Mg Tabs (Pioglitazone hcl) .... Take 1 tablet by mouth once a day 4)  Lipitor 10 Mg Tabs (Atorvastatin calcium) .... 1/2 tablet a day 5)  Omeprazole 20 Mg Cpdr (Omeprazole) .... Take 1 tablet by mouth once a day 6)  Metformin Hcl 1000 Mg Tabs (Metformin hcl) .Marland Kitchen.. 1 tab by mouth bid 7)  Pristiq 100 Mg Xr24h-tab (Desvenlafaxine succinate) .... One tab by mouth once daily 8)  Hydrochlorothiazide 12.5 Mg Caps (Hydrochlorothiazide) .Marland Kitchen.. 1 cap by mouth once daily 9)  Lortab 5 5-500 Mg Tabs (Hydrocodone-acetaminophen) .... One by mouth two times a day prn 10)  Ventolin Hfa  108 (90 Base) Mcg/act Aers (Albuterol sulfate) .Marland Kitchen.. 1-2 puffs every 3-4 hours as needed shortness of breath 11)  Victoza 18 Mg/27ml Soln (Liraglutide) .... 1.8 mg [place] in the evening 12)  Multivitamins Tabs (Multiple vitamin) .... Once daily 13)  Pen Needles 5/16" 31g X 8 Mm Misc (Insulin pen needle) .... Use once daily as directed 14)  Amitriptyline Hcl 25 Mg Tabs (Amitriptyline hcl) .... Once daily 15)  Metoprolol Tartrate 25 Mg Tabs (Metoprolol tartrate) .... Take one tablet by mouth twice a day 16)  Lorazepam 0.5 Mg Tabs (Lorazepam) .Marland Kitchen.. 1 tab by mouth two times a day as needed anxiety  Other Orders: UA Dipstick w/o Micro (automated)  (81003)  Patient Instructions: 1)  Cut dose of Altace from 2.5 mg to 1.25 mg/ day. 2)  Attend Bariatric Surgery Info session at [facility].  3)  Call them for information. 4)  Will call you with urine culture results on Thursday. 5)  Get back on Victoza (along with Actos and Metformin) for diabetes. 6)  A1C 6.4= good. 7)  Return for follow up Diabetes/ BP in 3 mos. Prescriptions: VICTOZA 18 MG/3ML SOLN (LIRAGLUTIDE) 1.8 mg [place] in the evening  #1 box x 3   Entered and Authorized by:   Seymour Bars DO   Signed by:   Seymour Bars DO on 03/01/2010   Method used:   Electronically to        [facility] #[id number]* (retail)       [address] [place], [place]  [address]       Ph: [phone]       Fax: [phone]   RxID:   [id number] ALTACE 1.25 MG CAPS (RAMIPRIL) 1 capsule by mouth daily  #30 x 6   Entered and Authorized by:   Seymour Bars DO   Signed by:   Seymour Bars DO on 03/01/2010   Method used:   Electronically to        [facility] #[id number]* (retail)  [address]       [place], [place]  [address]       Ph: [phone]       Fax: [phone]   RxID:   [id number]   Laboratory Results   Urine Tests    Routine Urinalysis   Color: yellow Appearance: Clear Glucose: negative   (Normal  Range: Negative) Bilirubin: negative   (Normal Range: Negative) Ketone: negative   (Normal Range: Negative) Spec. Gravity: 1.020   (Normal Range: 1.003-1.035) Blood: negative   (Normal Range: Negative) pH: 7.0   (Normal Range: 5.0-8.0) Protein: 30   (Normal Range: Negative) Urobilinogen: 0.2   (Normal Range: 0-1) Nitrite: negative   (Normal Range: Negative) Leukocyte Esterace: trace   (Normal Range: Negative)     Blood Tests     HGBA1C: 6.4%   (Normal Range: Non-Diabetic - 3-6%   Control Diabetic - 6-8%)

## 2010-11-22 NOTE — Assessment & Plan Note (Signed)
Summary: FLU SHOT//TH  Nurse Visit   Allergies: 1)  Claritin  Orders Added: 1)  Flu Vaccine 50yrs + [90658] 2)  Administration Flu vaccine - MCR [G0008] Flu Vaccine Consent Questions     Do you have a history of severe allergic reactions to this vaccine? no    Any prior history of allergic reactions to egg and/or gelatin? no    Do you have a sensitivity to the preservative Thimersol? no    Do you have a past history of Guillan-Barre Syndrome? no    Do you currently have an acute febrile illness? no    Have you ever had a severe reaction to latex? no    Vaccine information given and explained to patient? yes    Are you currently pregnant? no    Lot Number:[id number]   Exp Date:04/21/2010   Site Given  Left Deltoid IM]  .lbmedflu

## 2010-11-22 NOTE — Assessment & Plan Note (Signed)
Summary: f/u DM after gastric bypass   Vital Signs:  Patient profile:   55 year old female Height:      67 inches Weight:      252 pounds BMI:     39.61 O2 Sat:      95 % on Room air Pulse rate:   61 / minute BP sitting:   106 / 64  (left arm) Cuff size:   large  Vitals Entered By: Payton Spark CMA (September 14, 2010 9:06 AM)  O2 Flow:  Room air  CC: F/U DM   Primary Care Provider:  Seymour Bars DO  CC:  F/U DM.  History of Present Illness: 55 yo WF presents for f/u DM.  She had gastric bypass surgery with Dr Ezzard Standing on 08-22-10.  She has lost 11 lbs so far and she is OFF her DM meds.  She is just starting to eat soft foods.  She is still on her BP and cholesterol meds.    Denies any abd pain, N/V.   Her AM fastings are <110 since having surgery and she feels great.    She has been given the OK to start walking again. She has had some dumping syndrome.    She is taking all of her supplements.    Current Medications (verified): 1)  Altace 1.25 Mg Caps (Ramipril) .... Take 1 Cap By Mouth Once Daily 2)  Lipitor 10 Mg Tabs (Atorvastatin Calcium) .... 1/2 Tablet A Day 3)  Omeprazole 20 Mg Cpdr (Omeprazole) .... Take 1 Tablet By Mouth Once A Day 4)  Pristiq 100 Mg Xr24h-Tab (Desvenlafaxine Succinate) .... One Tab By Mouth Once Daily 5)  Lortab 5 5-500 Mg Tabs (Hydrocodone-Acetaminophen) .... One By Mouth Two Times A Day Prn 6)  Ventolin Hfa 108 (90 Base) Mcg/act Aers (Albuterol Sulfate) .Marland Kitchen.. 1-2 Puffs Every 3-4 Hours As Needed Shortness of Breath 7)  Multivitamins   Tabs (Multiple Vitamin) .... Once Daily 8)  Pen Needles 5/16" 31g X 8 Mm Misc (Insulin Pen Needle) .... Use Once Daily As Directed 9)  Amitriptyline Hcl 25 Mg Tabs (Amitriptyline Hcl) .... Once Daily 10)  Metoprolol Tartrate 25 Mg Tabs (Metoprolol Tartrate) .... Take One Tablet By Mouth Twice A Day 11)  Vitamin B12 12)  Calcium  Allergies (verified): 1)  Claritin  Past History:  Past Surgical  History: Carpal tunnel release cardiac ablation Lumbar laminectomy Lumbar fusion  gastric bypass 08-22-10; Dr Ezzard Standing  Social History: Reviewed history from 12/01/2009 and no changes required. 2 children (one biologic -in their 86's and one adopted who is 55 yo) Occupation:disabled Married Former smoker  Review of Systems      See HPI  Physical Exam  General:  alert, well-developed, well-nourished, and well-hydrated.  obese Mouth:  pharynx pink and moist.   Neck:  no masses.   Lungs:  Normal respiratory effort, chest expands symmetrically. Lungs are clear to auscultation, no crackles or wheezes. Heart:  Normal rate and regular rhythm. S1 and S2 normal without gallop, murmur, click, rub or other extra sounds. Pulses:  2+ radial pulses Extremities:  no LE edema Skin:  color normal.   Cervical Nodes:  No lymphadenopathy noted Psych:  good eye contact, not anxious appearing, and not depressed appearing.    Diabetes Management Exam:    Foot Exam (with socks and/or shoes not present):       Sensory-Pinprick/Light touch:          Left medial foot (L-4): normal  Left dorsal foot (L-5): normal          Left lateral foot (S-1): normal          Right medial foot (L-4): normal          Right dorsal foot (L-5): normal          Right lateral foot (S-1): normal       Sensory-Monofilament:          Left foot: normal          Right foot: normal       Inspection:          Left foot: normal          Right foot: normal       Nails:          Left foot: normal          Right foot: normal   Impression & Recommendations:  Problem # 1:  DIABETES MELLITUS, TYPE II (ICD-250.00) Assessment Improved Improved, not even a month after gastric bypass surgery with 11 lbs wt loss already.  She is OFF her Actos and Metformin and her home sugars look great.  Stay on Low sguar, low carb diet iwth regular exercise and home monitoring.  Will make sure her labs are UTD. The following medications  were removed from the medication list:    Bayer Aspirin Ec Low Dose 81 Mg Tbec (Aspirin) .Marland Kitchen... Take 1 tablet by mouth once a day    Actos 45 Mg Tabs (Pioglitazone hcl) .Marland Kitchen... Take 1 tablet by mouth once a day    Metformin Hcl 1000 Mg Tabs (Metformin hcl) .Marland Kitchen... 1 tab by mouth bid Her updated medication list for this problem includes:    Altace 1.25 Mg Caps (Ramipril) .Marland Kitchen... Take 1 cap by mouth once daily  Orders: Fingerstick (36416) Hgb A1C ([id number])  Problem # 2:  HYPERTENSION (ICD-401.9) BP at the lower end of normal.  I will stop her metoprolol now and keep her on Altace daily. The following medications were removed from the medication list:    Metoprolol Tartrate 25 Mg Tabs (Metoprolol tartrate) .Marland Kitchen... Take one tablet by mouth twice a day Her updated medication list for this problem includes:    Altace 1.25 Mg Caps (Ramipril) .Marland Kitchen... Take 1 cap by mouth once daily  BP today: 106/64 Prior BP: 91/56 (06/02/2010)  Labs Reviewed: K+: 3.7 (02/09/2010) Creat: : 0.7 (02/09/2010)   Chol: 139 (08/24/2009)   HDL: 41 (08/24/2009)   LDL: 75 (08/24/2009)   TG: 114 (08/24/2009)  Complete Medication List: 1)  Altace 1.25 Mg Caps (Ramipril) .... Take 1 cap by mouth once daily 2)  Lipitor 10 Mg Tabs (Atorvastatin calcium) .... 1/2 tablet a day 3)  Omeprazole 20 Mg Cpdr (Omeprazole) .... Take 1 tablet by mouth once a day 4)  Pristiq 100 Mg Xr24h-tab (Desvenlafaxine succinate) .... One tab by mouth once daily 5)  Lortab 5 5-500 Mg Tabs (Hydrocodone-acetaminophen) .... One by mouth two times a day prn 6)  Ventolin Hfa 108 (90 Base) Mcg/act Aers (Albuterol sulfate) .Marland Kitchen.. 1-2 puffs every 3-4 hours as needed shortness of breath 7)  Multivitamins Tabs (Multiple vitamin) .... Once daily 8)  Pen Needles 5/16" 31g X 8 Mm Misc (Insulin pen needle) .... Use once daily as directed 9)  Amitriptyline Hcl 25 Mg Tabs (Amitriptyline hcl) .... Once daily 10)  Vitamin B12  11)  Calcium   Patient Instructions: 1)  A1C  6= great. 2)  You are doing  great! 3)  Keep up the good work. 4)  Stop Metoprolol 5)  Return for f/u sugars/ BP/ WT in 3 mos.   Orders Added: 1)  Fingerstick [36416] 2)  Hgb A1C [83036QW] 3)  Est. Patient Level III [99213]    Laboratory Results   Blood Tests     HGBA1C: 6.0%   (Normal Range: Non-Diabetic - 3-6%   Control Diabetic - 6-8%)

## 2010-11-22 NOTE — Assessment & Plan Note (Signed)
Summary: FLU-SHOT-VEW  Nurse Visit   Vitals Entered By: Payton Spark CMA (August 03, 2010 9:24 AM)  Allergies: 1)  Claritin  Orders Added: 1)  Flu Vaccine 23yrs + MEDICARE PATIENTS [Q2039] 2)  Administration Flu vaccine - MCR [G0008] Flu Vaccine Consent Questions     Do you have a history of severe allergic reactions to this vaccine? no    Any prior history of allergic reactions to egg and/or gelatin? no    Do you have a sensitivity to the preservative Thimersol? no    Do you have a past history of Guillan-Barre Syndrome? no    Do you currently have an acute febrile illness? no    Have you ever had a severe reaction to latex? no    Vaccine information given and explained to patient? yes    Are you currently pregnant? no    Lot Number:[id number]   Exp Date:04/22/2011   Site Given  Left Deltoid IMmedflu

## 2010-11-22 NOTE — Assessment & Plan Note (Signed)
Summary: HFU CP   Vital Signs:  Patient profile:   55 year old female Height:      67 inches Weight:      263 pounds BMI:     41.34 O2 Sat:      98 % on Room air Temp:     98.5 degrees F oral Pulse rate:   67 / minute BP sitting:   111 / 75  (left arm) Cuff size:   large  Vitals Entered By: Payton Spark CMA (January 31, 2010 10:33 AM)  O2 Flow:  Room air CC: Hosp F/U for Chest pain.   Primary Care Provider:  Seymour Bars DO  CC:  Hosp F/U for Chest pain.Marland Kitchen  History of Present Illness: Theresa Scott presents for f/u after hospital discharge, 01/27/2010. She was admitted with chest pain 4/4 following a trip to the grocery store, where th pain was first noticed. She took two nitroglycerin tabs, with no relief. She was transported by ambulance to [facility] under the direction of her cardiologist, Dr. Shirlee Latch. She completed a stress test while hospitalized that was inconclusive. Negative PE work-up, negative cardiac enzymes. Normal CXR.  Today she presents with chest pain that started Saturday morning. No activity precipitated the pain. Her pain is a constant, heavy pressure that radiates across her left shoulder. The worst pain is in the center of the left upper part of her chest. She denies nausea, vomiting, back pain, and HA. She has slight SOB and pain with deep inspiration. She denies dizziness.   She had this same pain in 2008. At that time she had a cardiac catherization that was negative.   She sees Dr. Shirlee Latch, cardiology. She last saw him during her hospital admission. She has an appointment 4/20. They are aware that her chest pain restarted Saturday.   She has recently experienced stress d/t her husband's surgery and hospitalization.   Denies palpitations (hx of PSVT).  Denies heartburn.  Victoza is the newest medication added.     Allergies: 1)  Claritin  Past History:  Past Medical History: Reviewed history from 12/01/2009 and no changes required. 1. HYPERTENSION  (ICD-401.9) 2. BACK PAIN, CHRONIC (ICD-724.5) - Sees ortho and they write for pain meds. History of posterior spinal fusion.  3. INSOMNIA (ICD-780.52) 4. GERD (ICD-530.81) 5. HYPERLIPIDEMIA (ICD-272.4) 6. OSTEOARTHRITIS (ICD-715.90) 7. NEPHROLITHIASIS, HX OF (ICD-V13.01) - sees urology 8. DIABETES MELLITUS, TYPE II (ICD-250.00) 9. DEPRESSION (ICD-311) 10. Varicose veins 11. 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 Left heart cath in 5/08: No angiographic CAD, EF 60%.  13. Obesity  Past Surgical History: Reviewed history from 08/03/2008 and no changes required. Carpal tunnel release cardiac ablation Lumbar laminectomy Lumbar fusion   Social History: Reviewed history from 12/01/2009 and no changes required. 2 children (one biologic -in their 45's and one adopted who is 55 yo) Occupation:disabled Married Former smoker  Review of Systems      See HPI  Physical Exam  General:  alert, well-developed, and overweight-appearing.   Head:  normocephalic and atraumatic.   Eyes:  sclera non icteric Mouth:  pharynx pink and moist.   Neck:  supple and full ROM.   Chest Wall:  no deformities and no tenderness.  Negative for reproducible chest pain.  Lungs:  normal respiratory effort and normal breath sounds. Pain with deep inspiration.    Heart:  normal rate, regular rhythm, and no murmur.   Abdomen:  soft, non-tender, and normal  bowel sounds.  no epigastric TTP.  No HSM. Pulses:  R radial normal and L radial normal.   Extremities:  Trace bilateral lower leg edema.  Skin:  color normal and no rashes.   Psych:  normally interactive, good eye contact, not anxious appearing, and not depressed appearing.     Impression & Recommendations:  Problem # 1:  CHEST PAIN (ICD-786.50)  Reviewed discharge summary and hospital records from recent hospitalization 4/4-4/7. ECG done in office today. Normal rhythm, 66 bpm, normal axis, no  sign of ischemia.  F/U with cardiologist 02/09/2010.  Labs today. Will call results tomorrow.  Call if symptoms worsen.  Chest pain unlikely to be cardiac etiology given negative w/u.  Will treat for anxiety with the addition of Lorazepam and r/o any pancreatitis that would be due to her Victoza start.  Orders: T-Comprehensive Metabolic Panel [id number]) T-Amylase [id number]) T-Lipase [id number]) EKG w/ Interpretation (93000)  Problem # 2:  ANXIETY (ICD-300.00) Started Lorazepam 0.5mg  tablets by mouth two times/day as needed for anxiety, which may be causing chest pain.   Her updated medication list for this problem includes:    Pristiq 100 Mg Xr24h-tab (Desvenlafaxine succinate) ..... One tab by mouth once daily    Amitriptyline Hcl 25 Mg Tabs (Amitriptyline hcl) ..... Once daily    Lorazepam 0.5 Mg Tabs (Lorazepam) .Marland Kitchen... 1 tab by mouth two times a day as needed anxiety  Complete Medication List: 1)  Altace 2.5 Mg Caps (Ramipril) .... Take 1 tablet by mouth once a day 2)  Bayer Aspirin Ec Low Dose 81 Mg Tbec (Aspirin) .... Take 1 tablet by mouth once a day 3)  Actos 45 Mg Tabs (Pioglitazone hcl) .... Take 1 tablet by mouth once a day 4)  Lipitor 10 Mg Tabs (Atorvastatin calcium) .... 1/2 tablet a day 5)  Omeprazole 20 Mg Cpdr (Omeprazole) .... Take 1 tablet by mouth once a day 6)  Metformin Hcl 1000 Mg Tabs (Metformin hcl) .Marland Kitchen.. 1 tab by mouth bid 7)  Pristiq 100 Mg Xr24h-tab (Desvenlafaxine succinate) .... One tab by mouth once daily 8)  Hydrochlorothiazide 12.5 Mg Caps (Hydrochlorothiazide) .Marland Kitchen.. 1 cap by mouth once daily 9)  Lortab 5 5-500 Mg Tabs (Hydrocodone-acetaminophen) .... One by mouth two times a day prn 10)  Ventolin Hfa 108 (90 Base) Mcg/act Aers (Albuterol sulfate) .Marland Kitchen.. 1-2 puffs every 3-4 hours as needed shortness of breath 11)  Gabapentin 300 Mg Caps (Gabapentin) .... 2 capsules by mouth qhs 12)  Victoza 18 Mg/36ml Soln (Liraglutide) .... 1.8 mg [place] in the  evening 13)  Multivitamins Tabs (Multiple vitamin) .... Once daily 14)  Pen Needles 5/16" 31g X 8 Mm Misc (Insulin pen needle) .... Use once daily as directed 15)  Amitriptyline Hcl 25 Mg Tabs (Amitriptyline hcl) .... Once daily 16)  Metoprolol Tartrate 25 Mg Tabs (Metoprolol tartrate) .... Take one tablet by mouth twice a day 17)  Lorazepam 0.5 Mg Tabs (Lorazepam) .Marland Kitchen.. 1 tab by mouth two times a day as needed anxiety  Patient Instructions: 1)  EKG looks great. 2)  Start Lorazepam up to 2 x a day for anxiety which may be inducing your chest pain.   3)  Will check additional labs today. 4)  F/U with Dr Shirlee Latch on the 20th. Prescriptions: LORAZEPAM 0.5 MG TABS (LORAZEPAM) 1 tab by mouth two times a day as needed anxiety  #20 x 0   Entered and Authorized by:   Seymour Bars DO   Signed by:   Seymour Bars  DO on 01/31/2010   Method used:   Printed then faxed to ...       [facility] [address] #[id number]* (retail)       [address] [place], [place]  [address]       Ph: [phone]       Fax: [phone]   RxID:   [id number]

## 2010-11-22 NOTE — Assessment & Plan Note (Signed)
Summary: week f/u from dr Artist Pais acute apt-ch   Vital Signs:  Patient Profile:   55 Years Old Female Height:     67 inches Weight:      291 pounds O2 treatment:    Room Air Temp:     97.5 degrees F oral Pulse rate:   62 / minute Pulse rhythm:   regular Resp:     18 per minute BP sitting:   120 / 78  (left arm) Cuff size:   large  Vitals Entered By: Darra Lis RMA (August 12, 2008 1:00 PM)                 Visit Type:  follow up PCP:  Paulo Fruit MD  Chief Complaint:  follow for low blood pressure.  History of Present Illness: Patient saw Dr. Artist Pais on 08/03/08 for c/o cough, sore throat and sinus symptoms - as well as feeling light headed.  He dx her with RHINOSINUSITIS, ACUTE and Rx'd Cefuroxime Axetil 500 Mg Tabs One by mouth bid. Also at that appt, it was noted that her BP was 84/60.  Dr. Artist Pais stopped the patient's maxzide and altace.  She is here for a f/u.  Patient is feeling better overall - no further sinus pressure / facial pain or lightheadedness, but still with a nagging cough and some PND / allergy type symptoms.  No fever, chills, nausea, vomiting, diarrhea, SOB, chest pain.  Since stopping the maxzide, the patient has gained 10 pounds and has signficantly increased edema - but her BP is much better.  BGM running in the 100 - 120 range.    Updated Prior Medication List: ALTACE 2.5 MG CAPS (RAMIPRIL) Take 1 tablet by mouth once a day (hold) BAYER ASPIRIN EC LOW DOSE 81 MG TBEC (ASPIRIN) Take 1 tablet by mouth once a day ACTOS 45 MG TABS (PIOGLITAZONE HCL) Take 1 tablet by mouth once a day AMITRIPTYLINE HCL 25 MG TABS (AMITRIPTYLINE HCL) 1 to 2 tabs daily by mouth at bedtime LIPITOR 10 MG TABS (ATORVASTATIN CALCIUM) 1/2 tablet a day LORTAB 5 5-500 MG TABS (HYDROCODONE-ACETAMINOPHEN) one tablet by mouth twice daily OMEPRAZOLE 20 MG CPDR (OMEPRAZOLE) Take 1 tablet by mouth once a day GLYBURIDE-METFORMIN 5-500 MG TABS (GLYBURIDE-METFORMIN) Take 2  tablets by  mouth in the am and 1 tablet by mouth in the pm PRISTIQ 50 MG XR24H-TAB (DESVENLAFAXINE SUCCINATE) Take 1 tablet by mouth once a day CEFUROXIME AXETIL 500 MG TABS (CEFUROXIME AXETIL) one by mouth two times a day metoprolol 25mg  take 1/2 tab twice daily maxzizde 37.5 - 25 take 1 tab daily (hold)  Current Allergies (reviewed today): No known allergies   Past Medical History:    Reviewed history from 08/03/2008 and no changes required:       PROTEINURIA (ICD-791.0) - sees urology       HYPERTENSION (ICD-401.9)       BACK PAIN, CHRONIC (ICD-724.5) - sees ortho and they write for pain meds       INSOMNIA (ICD-780.52)       GERD (ICD-530.81)       HYPERLIPIDEMIA (ICD-272.4)       OSTEOARTHRITIS (ICD-715.90)       NEPHROLITHIASIS, HX OF (ICD-V13.01) - sees urology       DIABETES MELLITUS, TYPE II (ICD-250.00)       DEPRESSION (ICD-311)       Peripheral vascular disease   Past Surgical History:    Reviewed history from 08/03/2008 and no changes required:  Carpal tunnel release       cardiac ablation       Lumbar laminectomy       Lumbar fusion      Review of Systems       see HPI   Physical Exam  General:     Well-developed, well nourished, well hydrated morbidly obese female  in no acute distress; appropriate and cooperative   Head:     Normocephalic and atraumatic without obvious abnormalities.  Eyes:     No corneal or conjunctival inflammation. EOMI. PERRLA.  Vision grossly normal. Ears:     External ear shows no significant lesions or deformities.  Canals and TM WNL.  Hearing is grossly normal. Nose:     External nasal examination shows no deformity or inflammation. no sinus pain to palpation. Neck:     supple, full ROM, no masses, and no thyromegaly.   Lungs:     Normal respiratory effort, chest expands symmetrically with good air flow noted. Lungs clear to auscultation with no crackles, rales or wheezes.   Heart:     Normal rate and  rhythm. S1 and S2  normal, without gallop, murmur, click, rub  Msk:     No gross deformity noted of cervical, thoracic or lumbar spine. normal ROM. no muscle atrophy noted.   Extremities:     2+ edema on the right and 1+ on left - incrased from patient's  baseline.  stasis changes present.   Neurologic:     alert & oriented X3,  no deficit in strength noted, no balance problems noted, neuro is grossly intact Skin:     Intact without suspicious lesions or rashes Psych:     Cognition and judgment appear intact. Alert and cooperative with normal attention span and concentration. No apparent delusions, illusions, hallucinations, normally interactive, good eye contact, not anxious or depressed appearing, and not agitated.       Impression & Recommendations:  Problem # 1:  HYPERTENSION (ICD-401.9) Patient had significant hypotension, so will approach the medication changes AT this point, with patient's increased edema, will change from the maxzide that was on hold to the HCTZ 12.5 mg each day.  I have asked her to stop the maxzide and the metoprolol entirely.  I have reviewed BP parameters with the patient and she will call if daily home readings are above or below those parameters.  IF consistently above, we will add back the altace for renal protection in this DM patient.  She has a f/u with me next week.  She will call for any concerns. The following medications were removed from the medication list:    Triamterene-hctz 37.5-25 Mg Caps (Triamterene-hctz) .Marland Kitchen... Take 1 tablet by mouth once a day (hold)    Metoprolol Tartrate 25 Mg Tabs (Metoprolol tartrate) .Marland Kitchen... Take 1/2  tablet by mouth twice daily  Her updated medication list for this problem includes:    Altace 2.5 Mg Caps (Ramipril) .Marland Kitchen... Take 1 tablet by mouth once a day (hold)    Hydrochlorothiazide 12.5 Mg Caps (Hydrochlorothiazide) .Marland Kitchen... 1 cap by mouth once daily   Problem # 2:  PERIPHERAL EDEMA (ICD-782.3) see above discussion on meds. The following  medications were removed from the medication list:    Triamterene-hctz 37.5-25 Mg Caps (Triamterene-hctz) .Marland Kitchen... Take 1 tablet by mouth once a day (hold)  Her updated medication list for this problem includes:    Hydrochlorothiazide 12.5 Mg Caps (Hydrochlorothiazide) .Marland Kitchen... 1 cap by mouth once daily   Problem #  3:  DIABETES MELLITUS, TYPE II (ICD-250.00) Continue current meds Her updated medication list for this problem includes:    Altace 2.5 Mg Caps (Ramipril) .Marland Kitchen... Take 1 tablet by mouth once a day (hold)    Bayer Aspirin Ec Low Dose 81 Mg Tbec (Aspirin) .Marland Kitchen... Take 1 tablet by mouth once a day    Actos 45 Mg Tabs (Pioglitazone hcl) .Marland Kitchen... Take 1 tablet by mouth once a day    Glyburide-metformin 5-500 Mg Tabs (Glyburide-metformin) .Marland Kitchen... Take 2  tablets by mouth in the am and 1 tablet by mouth in the pm   Complete Medication List: 1)  Altace 2.5 Mg Caps (Ramipril) .... Take 1 tablet by mouth once a day (hold) 2)  Bayer Aspirin Ec Low Dose 81 Mg Tbec (Aspirin) .... Take 1 tablet by mouth once a day 3)  Actos 45 Mg Tabs (Pioglitazone hcl) .... Take 1 tablet by mouth once a day 4)  Amitriptyline Hcl 25 Mg Tabs (Amitriptyline hcl) .Marland Kitchen.. 1 to 2 tabs daily by mouth at bedtime 5)  Lipitor 10 Mg Tabs (Atorvastatin calcium) .... 1/2 tablet a day 6)  Lortab 5 5-500 Mg Tabs (Hydrocodone-acetaminophen) .... One tablet by mouth twice daily 7)  Omeprazole 20 Mg Cpdr (Omeprazole) .... Take 1 tablet by mouth once a day 8)  Glyburide-metformin 5-500 Mg Tabs (Glyburide-metformin) .... Take 2  tablets by mouth in the am and 1 tablet by mouth in the pm 9)  Pristiq 50 Mg Xr24h-tab (Desvenlafaxine succinate) .... Take 1 tablet by mouth once a day 10)  Cefuroxime Axetil 500 Mg Tabs (Cefuroxime axetil) .... One by mouth bid 11)  Hydrochlorothiazide 12.5 Mg Caps (Hydrochlorothiazide) .Marland Kitchen.. 1 cap by mouth once daily    Prescriptions: HYDROCHLOROTHIAZIDE 12.5 MG CAPS (HYDROCHLOROTHIAZIDE) 1 cap by mouth once daily   #30 x 1   Entered and Authorized by:   Paulo Fruit MD   Signed by:   Paulo Fruit MD on 08/12/2008   Method used:   Electronically to        [facility] [address] #[id number]* (retail)       [address] [place], [place]  [address]       Ph: [phone]       Fax: [phone]   RxID:   [id number]  ]

## 2010-11-22 NOTE — Assessment & Plan Note (Signed)
Summary: dr. Andrey Campanile pt-congestion-ch   Vital Signs:  Patient Profile:   55 Years Old Female Height:     67 inches Weight:      280 pounds BMI:     44.01 Temp:     98.0 degrees F oral Pulse rate:   80 / minute Pulse rhythm:   regular Resp:     16 per minute BP sitting:   84 / 60  (right arm) Cuff size:   large  Vitals Entered By: Glendell Docker CMA (August 03, 2008 2:34 PM)                 PCP:  Paulo Fruit MD  Chief Complaint:  Cough.  History of Present Illness:  Cough      This is a 55 year old woman who presents with Cough.  The patient reports non-productive cough, but denies productive cough and fever.  Associated symtpoms include sore throat and nasal congestion.  Pt also reports fascial pressure.  Onset of symptoms - 3 days.  OTC - self care measures include:  Alka seltzer.  She also reports feeling light headed.   She denies vomiting or diarrhea.       Current Allergies (reviewed today): No known allergies   Past Medical History:    PROTEINURIA (ICD-791.0) - sees urology    HYPERTENSION (ICD-401.9)    BACK PAIN, CHRONIC (ICD-724.5) - sees ortho and they write for pain meds    INSOMNIA (ICD-780.52)    GERD (ICD-530.81)    HYPERLIPIDEMIA (ICD-272.4)    OSTEOARTHRITIS (ICD-715.90)    NEPHROLITHIASIS, HX OF (ICD-V13.01) - sees urology    DIABETES MELLITUS, TYPE II (ICD-250.00)    DEPRESSION (ICD-311)    Peripheral vascular disease   Past Surgical History:    Carpal tunnel release    cardiac ablation    Lumbar laminectomy    Lumbar fusion    Social History:    2 children (one biologic -in their 54's and one adopted who is 55 yo)    Occupation:disabled    Married    Former smoker    Review of Systems      See HPI   Physical Exam  General:     alert and overweight-appearing.   Head:     normocephalic and atraumatic.   Eyes:     vision grossly intact, pupils equal, pupils round, and pupils reactive to light.   Ears:     R ear  normal and L ear normal.   Mouth:     no exudates and pharyngeal erythema.   Neck:     supple and no masses.   Lungs:     normal respiratory effort, normal breath sounds, no crackles, and no wheezes.   Heart:     normal rate, regular rhythm, and no gallop.      Impression & Recommendations:  Problem # 1:  RHINOSINUSITIS, ACUTE (ICD-461.8) Patient advised to call office if symptoms persist or worsen.  Her updated medication list for this problem includes:    Cefuroxime Axetil 500 Mg Tabs (Cefuroxime axetil) ..... One by mouth bid   Problem # 2:  HYPERTENSION (ICD-401.9) Pt with low BP with mild dizziness.   Pt advised to hold altace and maxide.   She will increase fluid intake over next 2-3 days.  Pt has home BP cuff to monitor BP.  F/U with Dr. Andrey Campanile next week.  Her updated medication list for this problem includes:    Altace 2.5 Mg Caps (  Ramipril) .Marland Kitchen... Take 1 tablet by mouth once a day (hold)    Triamterene-hctz 37.5-25 Mg Caps (Triamterene-hctz) .Marland Kitchen... Take 1 tablet by mouth once a day (hold)    Metoprolol Tartrate 25 Mg Tabs (Metoprolol tartrate) .Marland Kitchen... Take 1/2  tablet by mouth twice daily  BP today: 84/60 Prior BP: 102/70 (07/20/2008)  Labs Reviewed: Creat: 0.8 (06/18/2008) Chol: 119 (06/18/2008)   HDL: 28.4 (06/18/2008)   LDL: 68 (06/18/2008)   TG: 115 (06/18/2008)   Complete Medication List: 1)  Altace 2.5 Mg Caps (Ramipril) .... Take 1 tablet by mouth once a day (hold) 2)  Bayer Aspirin Ec Low Dose 81 Mg Tbec (Aspirin) .... Take 1 tablet by mouth once a day 3)  Actos 45 Mg Tabs (Pioglitazone hcl) .... Take 1 tablet by mouth once a day 4)  Amitriptyline Hcl 25 Mg Tabs (Amitriptyline hcl) .Marland Kitchen.. 1 to 2 tabs daily by mouth at bedtime 5)  Lipitor 10 Mg Tabs (Atorvastatin calcium) .... 1/2 tablet a day 6)  Triamterene-hctz 37.5-25 Mg Caps (Triamterene-hctz) .... Take 1 tablet by mouth once a day (hold) 7)  Lortab 5 5-500 Mg Tabs (Hydrocodone-acetaminophen) .... One tablet  by mouth twice daily 8)  Metoprolol Tartrate 25 Mg Tabs (Metoprolol tartrate) .... Take 1/2  tablet by mouth twice daily 9)  Omeprazole 20 Mg Cpdr (Omeprazole) .... Take 1 tablet by mouth once a day 10)  Glyburide-metformin 5-500 Mg Tabs (Glyburide-metformin) .... Take 2  tablets by mouth in the am and 1 tablet by mouth in the pm 11)  Pristiq 50 Mg Xr24h-tab (Desvenlafaxine succinate) .... Take 1 tablet by mouth once a day 12)  Cefuroxime Axetil 500 Mg Tabs (Cefuroxime axetil) .... One by mouth bid   Patient Instructions: 1)  Please schedule a follow-up appointment in 1 week with Dr. Andrey Campanile.   2)  Call office if symptoms persist or worsen.   Prescriptions: CEFUROXIME AXETIL 500 MG TABS (CEFUROXIME AXETIL) one by mouth bid  #20 x 0   Entered and Authorized by:   D. Thomos Lemons DO   Signed by:   D. Thomos Lemons DO on 08/03/2008   Method used:   Electronically to        [id number]* (retail)       [address] [place], [place]  [address]       Ph: [phone]       Fax: [phone]   RxID:   [id number]  ] Current Allergies (reviewed today): No known allergies

## 2010-11-22 NOTE — Letter (Signed)
Summary: Pre Visit Letter Revised  [facility]  [address] [place], [place] [address]   Phone: [phone]  Fax: [phone]        08/09/2010 MRN: [id number] [facility] [address] [place], [place]  [address]             Procedure Date:  12-1 at 9:30am   Welcome to the Gastroenterology Division at [facility].    You are scheduled to see a nurse for your pre-procedure visit on 09-08-10 at 10am on the 3rd floor at [facility], [address]  We ask that you try to arrive at our office 15 minutes prior to your appointment time to allow for check-in.  Please take a minute to review the attached form.  If you answer "Yes" to one or more of the questions on the first page, we ask that you call the person listed at your earliest opportunity.  If you answer "No" to all of the questions, please complete the rest of the form and bring it to your appointment.    Your nurse visit will consist of discussing your medical and surgical history, your immediate family medical history, and your medications.   If you are unable to list all of your medications on the form, please bring the medication bottles to your appointment and we will list them.  We will need to be aware of both prescribed and over the counter drugs.  We will need to know exact dosage information as well.    Please be prepared to read and sign documents such as consent forms, a financial agreement, and acknowledgement forms.  If necessary, and with your consent, a friend or relative is welcome to sit-in on the nurse visit with you.  Please bring your insurance card so that we may make a copy of it.  If your insurance requires a referral to see a specialist, please bring your referral form from your primary care physician.  No co-pay is required for this nurse visit.     If you cannot keep your appointment, please call [phone] to cancel or reschedule prior to your appointment date.  This  allows [place] the opportunity to schedule an appointment for another patient in need of care.    Thank you for choosing [facility] for your medical needs.  We appreciate the opportunity to care for you.  Please visit [place] at our website  to learn more about our practice.  Sincerely, The Gastroenterology Division

## 2010-11-22 NOTE — Letter (Signed)
Summary: Weight Loss Attempts Form/[facility]  Weight Loss Attempts Form/[facility]   Imported By: Lanelle Bal 05/12/2010 08:14:49  _____________________________________________________________________  External Attachment:    Type:   Image     Comment:   External Document

## 2010-11-22 NOTE — Assessment & Plan Note (Signed)
Summary: eph/appt @8 :30/jss   Primary Provider:  Seymour Bars DO  CC:  eph. Pt feeling better.  History of Present Illness: 55 yo with history of AVNRT ablation, obesity, HTN presents for followup of chest pain.  Patient had an episode of severe chest pressure that was prolonged earlier this month.  She was admitted at [facility] and ruled out for MI with negative enzymes.  ECG was unremarkable.  Lexiscan myoview was done, showing EF 58% and a small, mild mid anterior reversible perfusion defect.  This was thought to be a low risk study with possible shifting breast attenuation.  Patient was sent home but has had 3 more episodes of central chest pressure since that time, each lasting from 30 minutes to 2 hours.  Pain is not related to meals or to exertion.  She is very worried by the pain.  She is taking omeprazole.  Dr. Cathey Endow gave her a prescription for lorazepam but that has not prevented chest pressure either.   She is noting only rare palpitations at this point.    Labs (11/10): LDL 75, HDL 41 Labs (2/11): TSH normal, creatinine 0.63 Labs (4/11): creatinine 0.73, LFTs normal, cardiac enzymes negative, lipase/amylase normal.   ECG: NSR, normal      Current Medications (verified): 1)  Altace 2.5 Mg Caps (Ramipril) .... Take 1 Tablet By Mouth Once A Day 2)  Bayer Aspirin Ec Low Dose 81 Mg Tbec (Aspirin) .... Take 1 Tablet By Mouth Once A Day 3)  Actos 45 Mg Tabs (Pioglitazone Hcl) .... Take 1 Tablet By Mouth Once A Day 4)  Lipitor 10 Mg Tabs (Atorvastatin Calcium) .... 1/2 Tablet A Day 5)  Omeprazole 20 Mg Cpdr (Omeprazole) .... Take 1 Tablet By Mouth Once A Day 6)  Metformin Hcl 1000 Mg Tabs (Metformin Hcl) .Marland Kitchen.. 1 Tab By Mouth Bid 7)  Pristiq 100 Mg Xr24h-Tab (Desvenlafaxine Succinate) .... One Tab By Mouth Once Daily 8)  Hydrochlorothiazide 12.5 Mg Caps (Hydrochlorothiazide) .Marland Kitchen.. 1 Cap By Mouth Once Daily 9)  Lortab 5 5-500 Mg Tabs (Hydrocodone-Acetaminophen) .... One By Mouth Two  Times A Day Prn 10)  Ventolin Hfa 108 (90 Base) Mcg/act Aers (Albuterol Sulfate) .Marland Kitchen.. 1-2 Puffs Every 3-4 Hours As Needed Shortness of Breath 11)  Victoza 18 Mg/3ml Soln (Liraglutide) .... 1.8 Mg  in The Evening 12)  Multivitamins   Tabs (Multiple Vitamin) .... Once Daily 13)  Pen Needles 5/16" 31g X 8 Mm Misc (Insulin Pen Needle) .... Use Once Daily As Directed 14)  Amitriptyline Hcl 25 Mg Tabs (Amitriptyline Hcl) .... Once Daily 15)  Metoprolol Tartrate 25 Mg Tabs (Metoprolol Tartrate) .... Take One Tablet By Mouth Twice A Day 16)  Lorazepam 0.5 Mg Tabs (Lorazepam) .Marland Kitchen.. 1 Tab By Mouth Two Times A Day As Needed Anxiety  Allergies (verified): 1)  Claritin  Past History:  Past Medical History: 1. HYPERTENSION (ICD-401.9) 2. BACK PAIN, CHRONIC (ICD-724.5) - Sees ortho and they write for pain meds. History of posterior spinal fusion.  3. INSOMNIA (ICD-780.52) 4. GERD (ICD-530.81) 5. HYPERLIPIDEMIA (ICD-272.4) 6. OSTEOARTHRITIS (ICD-715.90) 7. NEPHROLITHIASIS, HX OF (ICD-V13.01) - sees urology 8. DIABETES MELLITUS, TYPE II (ICD-250.00) 9. DEPRESSION (ICD-311) 10. Varicose veins 11. 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  Event monitor in 2/11 done for palpitations showed only PACs.  13. Left heart cath in 5/08: No angiographic CAD, EF 60%.  13. Obesity 14. Chest pain: Lexiscan myoview (4/11) with EF 58%, normal wall motion,  small mild reversible mid anterior defect thought to possibly be due to shifting breast attenuation.  Low risk study.   Family History: Reviewed history from 06/18/2008 and no changes required. Family History Diabetes Family History Hypertension Family History of Alcoholism - mother Family History of CAD Female   Social History: Reviewed history from 12/01/2009 and no changes required. 2 children (one biologic -in their 23's and one adopted who is 55 yo) Occupation:disabled Married Former  smoker  Review of Systems       All systems reviewed and negative except as per HPI.   Vital Signs:  Patient profile:   55 year old female Height:      67 inches Weight:      258 pounds BMI:     40.55 Pulse rate:   74 / minute Pulse rhythm:   regular BP sitting:   100 / 60  (left arm) Cuff size:   large  Vitals Entered By: Judithe Modest CMA (February 09, 2010 8:41 AM)  Physical Exam  General:  alert, well-developed, and overweight-appearing.   Neck:  Neck supple, no JVD. No masses, thyromegaly or abnormal cervical nodes. Lungs:  Clear bilaterally to auscultation and percussion. Heart:  Non-displaced PMI, chest non-tender; regular rate and rhythm, S1, S2 without murmurs, rubs or gallops. Carotid upstroke normal, no bruit.  Pedals normal pulses. No edema, no varicosities. Abdomen:  Bowel sounds positive; abdomen soft and non-tender without masses, organomegaly, or hernias noted. No hepatosplenomegaly. Extremities:  No clubbing or cyanosis. Neurologic:  Alert and oriented x 3. Psych:  Normal affect.   Impression & Recommendations:  Problem # 1:  CHEST PAIN (ICD-786.50) Recently hospitalized with chest pain, ruled out for MI and had myoview showing small, mild reversible anterior defect that I suspect may have been due to shifting breast attenuation.  She continues to have episodes of prolonged, atypical chest pain.  She is on a proton pump inhibitor.  She is very worried about coronary disease.  We discussed strategies for further workup, I suggested coronary CTA versus cath.  She wanted to proceed with catheterization so that she will know for sure.  I will use a radial approach. Continue ASA, statin, metoprolol, ACEI.   Problem # 2:  PSVT (ICD-427.0) Patient has history of AVNRT s/p ablation.  At prior appointment, she was feeling runs of palpitations (under 1 minute at a time) reminiscent of her AVNRT.  ECG showed an abnormal P wave axis. However, 3 week event monitor showed only  some PACs. She will continue metoprolol 25 mg bid given significantly symptomatic palpitations and history of SVT.  Echo showed a structurally normal heart.   Patient Instructions: 1)  Your physician recommends that you return have lab today. 2)  Your physician has requested that you have a cardiac catheterization.  Cardiac catheterization is used to diagnose and/or treat various heart conditions. Doctors may recommend this procedure for a number of different reasons. The most common reason is to evaluate chest pain. Chest pain can be a symptom of coronary artery disease (CAD), and cardiac catheterization can show whether plaque is narrowing or blocking your heart's arteries. This procedure is also used to evaluate the valves, as well as measure the blood flow and oxygen levels in different parts of your heart.  For further information please visit [email or web address]  Please follow instruction sheet, as given. 3)  Your physician recommends that you schedule a follow-up appointment in: 2-3 weeks after the cath with Dr Shirlee Latch.

## 2010-11-23 ENCOUNTER — Encounter: Payer: Self-pay | Admitting: Family Medicine

## 2010-11-24 NOTE — Progress Notes (Signed)
Summary: Pristiq order  Phone Note Call from Patient   Caller: Patient Summary of Call: Pristiq order placed.  Order # [id number] Initial call taken by: Payton Spark CMA,  October 20, 2010 10:52 AM

## 2010-11-24 NOTE — Letter (Signed)
Summary: [facility]   Imported By: Lanelle Bal 10/10/2010 10:16:18  _____________________________________________________________________  External Attachment:    Type:   Image     Comment:   External Document

## 2010-12-08 NOTE — Letter (Signed)
Summary: [facility]   Imported By: Maryln Gottron 11/30/2010 12:48:10  _____________________________________________________________________  External Attachment:    Type:   Image     Comment:   External Document

## 2010-12-15 ENCOUNTER — Ambulatory Visit (INDEPENDENT_AMBULATORY_CARE_PROVIDER_SITE_OTHER): Payer: Medicare Other | Admitting: Family Medicine

## 2010-12-15 ENCOUNTER — Encounter: Payer: Self-pay | Admitting: Family Medicine

## 2010-12-15 DIAGNOSIS — M79609 Pain in unspecified limb: Secondary | ICD-10-CM

## 2010-12-15 DIAGNOSIS — E119 Type 2 diabetes mellitus without complications: Secondary | ICD-10-CM

## 2010-12-15 DIAGNOSIS — E669 Obesity, unspecified: Secondary | ICD-10-CM

## 2010-12-15 DIAGNOSIS — I10 Essential (primary) hypertension: Secondary | ICD-10-CM

## 2010-12-15 DIAGNOSIS — R7301 Impaired fasting glucose: Secondary | ICD-10-CM | POA: Insufficient documentation

## 2010-12-15 LAB — CONVERTED CEMR LAB: Hgb A1c MFr Bld: 6 %

## 2010-12-20 NOTE — Assessment & Plan Note (Signed)
Summary: f/u WT/ DM   Vital Signs:  Patient profile:   55 year old female Height:      67 inches Weight:      222 pounds BMI:     34.90 O2 Sat:      96 % on Room air Pulse rate:   80 / minute BP sitting:   126 / 71  (left arm) Cuff size:   large  Vitals Entered By: Payton Spark CMA (December 15, 2010 9:22 AM)  O2 Flow:  Room air CC: F/U DM and gastric bypass   Primary Care Provider:  Seymour Bars DO  CC:  F/U DM and gastric bypass.  History of Present Illness: 55 year old female presents for followup on sugar and BP s/p RYGB 08-22-10 with Dr Ezzard Standing.  She is down 41 lbs total.    She is off her DM meds and her sugars are running  from 85-112.  She is still taking her Lipitor and Altace.  She had some nausea and vomiting after her surgery initially but states it has been doing much better with changes to diet and it has been almost a month since she has gotten sick to her stomach.  She plans to start back at the [facility] 4-5 days/week walking and getting in the pool.  She is maintaining a low carb, low sugar diet and is starting to understand exactly how much she can eat and what she can eat in order to avoid getting nauseated.  Also of note, she missed a step on Sunday and slid down the stairs.  She states her left foot went under her and her big toe bent backwards.  She says she went to urgent care and got x-rays of her left foot but they were negative.  She says it is still sore today.  Current Medications (verified): 1)  Altace 1.25 Mg Caps (Ramipril) .... Take 1 Cap By Mouth Once Daily 2)  Lipitor 10 Mg Tabs (Atorvastatin Calcium) .... 1/2 Tablet A Day 3)  Omeprazole 20 Mg Cpdr (Omeprazole) .... Take 1 Tablet By Mouth Once A Day 4)  Pristiq 100 Mg Xr24h-Tab (Desvenlafaxine Succinate) .... One Tab By Mouth Once Daily 5)  Lortab 5 5-500 Mg Tabs (Hydrocodone-Acetaminophen) .... One By Mouth Two Times A Day Prn 6)  Ventolin Hfa 108 (90 Base) Mcg/act Aers (Albuterol Sulfate) .Marland Kitchen..  1-2 Puffs Every 3-4 Hours As Needed Shortness of Breath 7)  Multivitamins   Tabs (Multiple Vitamin) .... Once Daily 8)  Amitriptyline Hcl 25 Mg Tabs (Amitriptyline Hcl) .... Once Daily 9)  Vitamin B12 10)  Calcium  Allergies (verified): 1)  Claritin  Past History:  Past Medical History: Reviewed history from 03/09/2010 and no changes required. 1. HYPERTENSION (ICD-401.9) 2. BACK PAIN, CHRONIC (ICD-724.5) - Sees ortho and they write for pain meds. History of posterior spinal fusion.  3. INSOMNIA (ICD-780.52) 4. GERD (ICD-530.81) 5. HYPERLIPIDEMIA (ICD-272.4) 6. OSTEOARTHRITIS (ICD-715.90) 7. NEPHROLITHIASIS, HX OF (ICD-V13.01) - sees urology 8. DIABETES MELLITUS, TYPE II (ICD-250.00) 9. DEPRESSION (ICD-311) 10. Varicose veins 11. 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  Event monitor in 2/11 done for palpitations showed only PACs.  13. Left heart cath in 5/08: No angiographic CAD, EF 60%.  13. Obesity 14. Chest pain: Lexiscan myoview (4/11) with EF 58%, normal wall motion, small mild reversible mid anterior defect thought to possibly be due to shifting breast attenuation.  Low risk study.  Patient continued to have ches  pain so underwent LHC (4/11) showing no angiographic CAD and EF 60%.   Past Surgical History: Reviewed history from 09/14/2010 and no changes required. Carpal tunnel release cardiac ablation Lumbar laminectomy Lumbar fusion  gastric bypass 08-22-10; Dr Ezzard Standing  Social History: Reviewed history from 12/01/2009 and no changes required. 2 children (one biologic -in their 41's and one adopted who is 55 yo) Occupation:disabled Married Former smoker  Review of Systems      See HPI  Physical Exam  General:  alert, well-developed, and normal appearance.  obese Head:  normocephalic and atraumatic.   Eyes:  pupils equal, pupils round, and pupils reactive to light.  Conjuctiva clear. Nose:  no external  erythema and no nasal discharge.   Mouth:  Oropharynx slightly injected but without exudate. Lungs:  normal respiratory effort, normal breath sounds, no crackles, and no wheezes.   Heart:  normal rate, regular rhythm, no murmur, no gallop, and no rub.   Abdomen:  soft, non-tender, normal bowel sounds, and no distention.   Msk:  Tenderness under left 1st MTP joint and along the dorsal side of the first metatarsal.  Small area of bruising noted on the lateral side of her 1st digit on her left foot.  EHL, plantarflexion and dorsiflexion 5/5 on the left.  She also has a nontender, nonmobile cyst that is above the 1st MTP joint on her right foot. Pulses:  R radial normal and L radial normal.   Skin:  color normal.   Psych:  good eye contact, not anxious appearing, and not depressed appearing.     Impression & Recommendations:  Problem # 1:  DIABETES MELLITUS, TYPE II (ICD-250.00) Improved.  Has officially converted to IFG from T2DM with A1C of 6 - off meds.  Continue diet, exercise, wt loss. Her updated medication list for this problem includes:    Altace 1.25 Mg Caps (Ramipril) .Marland Kitchen... Take 1 cap by mouth once daily  Problem # 2:  OBESITY (ICD-278.00) Imporving s/p RYGB - 41# down.  BMI down from >40 to 34, class II obeisity.  Reviewed labs from [facility].  Continue supplements, healthy diet - small portion sizes and regular exercise.  Problem # 3:  HYPERTENSION (ICD-401.9) She has not been taking Metoprolol since her last visit with me three months ago.  Her blood pressure today is 126/71. She will continue her Altace as her blood pressure is under control and she has a history of diabetes.  Her updated medication list for this problem includes:    Altace 1.25 Mg Caps (Ramipril) .Marland Kitchen... Take 1 cap by mouth once daily  Problem # 4:  FOOT PAIN (ICD-729.5)  Patient had bruising of her foot but negative radiographs at urgent care so a fracture is unlikely.  She is instructed to rest and take Tylenol for  pain if it is affecting her function.  If the foot is not feeling better in two weeks, please call for an appointment.  Orders: Podiatry Referral (Podiatry)  Complete Medication List: 1)  Altace 1.25 Mg Caps (Ramipril) .... Take 1 cap by mouth once daily 2)  Lipitor 10 Mg Tabs (Atorvastatin calcium) .... 1/2 tablet a day 3)  Omeprazole 20 Mg Cpdr (Omeprazole) .... Take 1 tablet by mouth once a day 4)  Pristiq 100 Mg Xr24h-tab (Desvenlafaxine succinate) .... One tab by mouth once daily 5)  Lortab 5 5-500 Mg Tabs (Hydrocodone-acetaminophen) .... One by mouth two times a day prn 6)  Ventolin Hfa 108 (90 Base) Mcg/act Aers (Albuterol sulfate) .Marland KitchenMarland KitchenMarland Kitchen  1-2 puffs every 3-4 hours as needed shortness of breath 7)  Multivitamins Tabs (Multiple vitamin) .... Once daily 8)  Amitriptyline Hcl 25 Mg Tabs (Amitriptyline hcl) .... Once daily 9)  Vitamin B12  10)  Calcium   Other Orders: Fingerstick (36416) Hgb A1C (84132GM)  Patient Instructions: 1)  Keep up the good work! 2)  Stay on current meds. 3)  A1c 6.0= pre-diabetic range. 4)  Podiatry referral made. 5)  f/u with gyn. 6)  Return for f/u visit in 4 mos.   Orders Added: 1)  Fingerstick [36416] 2)  Hgb A1C [83036QW] 3)  Podiatry Referral [Podiatry] 4)  Est. Patient Level IV [01027]    Laboratory Results   Blood Tests     HGBA1C: 6.0%   (Normal Range: Non-Diabetic - 3-6%   Control Diabetic - 6-8%)

## 2010-12-26 ENCOUNTER — Encounter: Admit: 2010-12-26 | Payer: Self-pay | Admitting: Surgery

## 2010-12-26 ENCOUNTER — Encounter: Payer: Medicare Other | Attending: Surgery | Admitting: *Deleted

## 2010-12-26 DIAGNOSIS — Z9884 Bariatric surgery status: Secondary | ICD-10-CM | POA: Insufficient documentation

## 2010-12-26 DIAGNOSIS — Z09 Encounter for follow-up examination after completed treatment for conditions other than malignant neoplasm: Secondary | ICD-10-CM | POA: Insufficient documentation

## 2010-12-26 DIAGNOSIS — Z713 Dietary counseling and surveillance: Secondary | ICD-10-CM | POA: Insufficient documentation

## 2011-01-03 LAB — GLUCOSE, CAPILLARY
Glucose-Capillary: 104 mg/dL — ABNORMAL HIGH (ref 70–99)
Glucose-Capillary: 104 mg/dL — ABNORMAL HIGH (ref 70–99)
Glucose-Capillary: 107 mg/dL — ABNORMAL HIGH (ref 70–99)
Glucose-Capillary: 108 mg/dL — ABNORMAL HIGH (ref 70–99)
Glucose-Capillary: 108 mg/dL — ABNORMAL HIGH (ref 70–99)
Glucose-Capillary: 109 mg/dL — ABNORMAL HIGH (ref 70–99)
Glucose-Capillary: 112 mg/dL — ABNORMAL HIGH (ref 70–99)
Glucose-Capillary: 122 mg/dL — ABNORMAL HIGH (ref 70–99)
Glucose-Capillary: 125 mg/dL — ABNORMAL HIGH (ref 70–99)
Glucose-Capillary: 135 mg/dL — ABNORMAL HIGH (ref 70–99)
Glucose-Capillary: 135 mg/dL — ABNORMAL HIGH (ref 70–99)
Glucose-Capillary: 141 mg/dL — ABNORMAL HIGH (ref 70–99)
Glucose-Capillary: 155 mg/dL — ABNORMAL HIGH (ref 70–99)
Glucose-Capillary: 84 mg/dL (ref 70–99)
Glucose-Capillary: 97 mg/dL (ref 70–99)
Glucose-Capillary: 98 mg/dL (ref 70–99)

## 2011-01-03 LAB — CBC
HCT: 35 % — ABNORMAL LOW (ref 36.0–46.0)
HCT: 37.4 % (ref 36.0–46.0)
HCT: 39 % (ref 36.0–46.0)
Hemoglobin: 11.9 g/dL — ABNORMAL LOW (ref 12.0–15.0)
Hemoglobin: 12.6 g/dL (ref 12.0–15.0)
Hemoglobin: 13.3 g/dL (ref 12.0–15.0)
MCH: 30.1 pg (ref 26.0–34.0)
MCH: 30.4 pg (ref 26.0–34.0)
MCH: 30.4 pg (ref 26.0–34.0)
MCHC: 33.6 g/dL (ref 30.0–36.0)
MCHC: 34 g/dL (ref 30.0–36.0)
MCHC: 34.1 g/dL (ref 30.0–36.0)
MCV: 89.2 fL (ref 78.0–100.0)
MCV: 89.3 fL (ref 78.0–100.0)
MCV: 89.6 fL (ref 78.0–100.0)
Platelets: 142 10*3/uL — ABNORMAL LOW (ref 150–400)
Platelets: 144 10*3/uL — ABNORMAL LOW (ref 150–400)
Platelets: 169 10*3/uL (ref 150–400)
RBC: 3.93 MIL/uL (ref 3.87–5.11)
RBC: 4.18 MIL/uL (ref 3.87–5.11)
RBC: 4.37 MIL/uL (ref 3.87–5.11)
RDW: 13.2 % (ref 11.5–15.5)
RDW: 13.4 % (ref 11.5–15.5)
RDW: 13.4 % (ref 11.5–15.5)
WBC: 10.4 10*3/uL (ref 4.0–10.5)
WBC: 8.3 10*3/uL (ref 4.0–10.5)
WBC: 9.4 10*3/uL (ref 4.0–10.5)

## 2011-01-03 LAB — DIFFERENTIAL
Basophils Absolute: 0 10*3/uL (ref 0.0–0.1)
Basophils Absolute: 0 10*3/uL (ref 0.0–0.1)
Basophils Absolute: 0 10*3/uL (ref 0.0–0.1)
Basophils Relative: 0 % (ref 0–1)
Basophils Relative: 0 % (ref 0–1)
Basophils Relative: 0 % (ref 0–1)
Eosinophils Absolute: 0 10*3/uL (ref 0.0–0.7)
Eosinophils Absolute: 0 10*3/uL (ref 0.0–0.7)
Eosinophils Absolute: 0.1 10*3/uL (ref 0.0–0.7)
Eosinophils Relative: 0 % (ref 0–5)
Eosinophils Relative: 0 % (ref 0–5)
Eosinophils Relative: 1 % (ref 0–5)
Lymphocytes Relative: 13 % (ref 12–46)
Lymphocytes Relative: 15 % (ref 12–46)
Lymphocytes Relative: 19 % (ref 12–46)
Lymphs Abs: 1.4 10*3/uL (ref 0.7–4.0)
Lymphs Abs: 1.5 10*3/uL (ref 0.7–4.0)
Lymphs Abs: 1.6 10*3/uL (ref 0.7–4.0)
Monocytes Absolute: 0.6 10*3/uL (ref 0.1–1.0)
Monocytes Absolute: 0.7 10*3/uL (ref 0.1–1.0)
Monocytes Absolute: 0.7 10*3/uL (ref 0.1–1.0)
Monocytes Relative: 6 % (ref 3–12)
Monocytes Relative: 8 % (ref 3–12)
Monocytes Relative: 9 % (ref 3–12)
Neutro Abs: 5.9 10*3/uL (ref 1.7–7.7)
Neutro Abs: 7.2 10*3/uL (ref 1.7–7.7)
Neutro Abs: 8.4 10*3/uL — ABNORMAL HIGH (ref 1.7–7.7)
Neutrophils Relative %: 71 % (ref 43–77)
Neutrophils Relative %: 76 % (ref 43–77)
Neutrophils Relative %: 81 % — ABNORMAL HIGH (ref 43–77)

## 2011-01-03 LAB — HEMOGLOBIN AND HEMATOCRIT, BLOOD
HCT: 36.5 % (ref 36.0–46.0)
Hemoglobin: 12.2 g/dL (ref 12.0–15.0)

## 2011-01-04 LAB — CBC
HCT: 42.9 % (ref 36.0–46.0)
Hemoglobin: 14.5 g/dL (ref 12.0–15.0)
MCH: 29.9 pg (ref 26.0–34.0)
MCHC: 33.8 g/dL (ref 30.0–36.0)
MCV: 88.5 fL (ref 78.0–100.0)
Platelets: 220 10*3/uL (ref 150–400)
RBC: 4.84 MIL/uL (ref 3.87–5.11)
RDW: 13.4 % (ref 11.5–15.5)
WBC: 8.9 10*3/uL (ref 4.0–10.5)

## 2011-01-04 LAB — COMPREHENSIVE METABOLIC PANEL
ALT: 25 U/L (ref 0–35)
AST: 40 U/L — ABNORMAL HIGH (ref 0–37)
Albumin: 4.2 g/dL (ref 3.5–5.2)
Alkaline Phosphatase: 82 U/L (ref 39–117)
BUN: 22 mg/dL (ref 6–23)
CO2: 29 mEq/L (ref 19–32)
Calcium: 9.9 mg/dL (ref 8.4–10.5)
Chloride: 101 mEq/L (ref 96–112)
Creatinine, Ser: 0.76 mg/dL (ref 0.4–1.2)
GFR calc Af Amer: >60 mL/min (ref >60)
GFR calc non Af Amer: >60 mL/min (ref >60)
Glucose, Bld: 148 mg/dL — ABNORMAL HIGH (ref 70–99)
Potassium: 4.7 mEq/L (ref 3.5–5.1)
Sodium: 138 mEq/L (ref 135–145)
Total Bilirubin: 0.9 mg/dL (ref 0.3–1.2)
Total Protein: 7.5 g/dL (ref 6.0–8.3)

## 2011-01-04 LAB — GLUCOSE, CAPILLARY
Glucose-Capillary: 134 mg/dL — ABNORMAL HIGH (ref 70–99)
Glucose-Capillary: 140 mg/dL — ABNORMAL HIGH (ref 70–99)
Glucose-Capillary: 157 mg/dL — ABNORMAL HIGH (ref 70–99)

## 2011-01-04 LAB — DIFFERENTIAL
Basophils Absolute: 0 10*3/uL (ref 0.0–0.1)
Basophils Relative: 0 % (ref 0–1)
Eosinophils Absolute: 0.1 10*3/uL (ref 0.0–0.7)
Eosinophils Relative: 1 % (ref 0–5)
Lymphocytes Relative: 29 % (ref 12–46)
Lymphs Abs: 2.6 10*3/uL (ref 0.7–4.0)
Monocytes Absolute: 0.7 10*3/uL (ref 0.1–1.0)
Monocytes Relative: 8 % (ref 3–12)
Neutro Abs: 5.4 10*3/uL (ref 1.7–7.7)
Neutrophils Relative %: 61 % (ref 43–77)

## 2011-01-04 LAB — SURGICAL PCR SCREEN
MRSA, PCR: NEGATIVE
Staphylococcus aureus: NEGATIVE

## 2011-01-10 LAB — POCT I-STAT GLUCOSE
Glucose, Bld: 128 mg/dL — ABNORMAL HIGH (ref 70–99)
Operator id: 141321

## 2011-01-11 LAB — CBC
HCT: 34.3 % — ABNORMAL LOW (ref 36.0–46.0)
HCT: 36.1 % (ref 36.0–46.0)
HCT: 38 % (ref 36.0–46.0)
Hemoglobin: 11.3 g/dL — ABNORMAL LOW (ref 12.0–15.0)
Hemoglobin: 11.9 g/dL — ABNORMAL LOW (ref 12.0–15.0)
Hemoglobin: 12.6 g/dL (ref 12.0–15.0)
MCHC: 32.9 g/dL (ref 30.0–36.0)
MCHC: 33 g/dL (ref 30.0–36.0)
MCHC: 33.2 g/dL (ref 30.0–36.0)
MCV: 87.1 fL (ref 78.0–100.0)
MCV: 87.8 fL (ref 78.0–100.0)
MCV: 88.7 fL (ref 78.0–100.0)
Platelets: 166 10*3/uL (ref 150–400)
Platelets: 177 10*3/uL (ref 150–400)
Platelets: 212 10*3/uL (ref 150–400)
RBC: 3.9 MIL/uL (ref 3.87–5.11)
RBC: 4.07 MIL/uL (ref 3.87–5.11)
RBC: 4.36 MIL/uL (ref 3.87–5.11)
RDW: 15.4 % (ref 11.5–15.5)
RDW: 15.7 % — ABNORMAL HIGH (ref 11.5–15.5)
RDW: 15.7 % — ABNORMAL HIGH (ref 11.5–15.5)
WBC: 10.1 10*3/uL (ref 4.0–10.5)
WBC: 6.6 10*3/uL (ref 4.0–10.5)
WBC: 7.5 10*3/uL (ref 4.0–10.5)

## 2011-01-11 LAB — BASIC METABOLIC PANEL
BUN: 8 mg/dL (ref 6–23)
CO2: 30 mEq/L (ref 19–32)
Calcium: 8.7 mg/dL (ref 8.4–10.5)
Chloride: 106 mEq/L (ref 96–112)
Creatinine, Ser: 0.72 mg/dL (ref 0.4–1.2)
GFR calc Af Amer: >60 mL/min (ref >60)
GFR calc non Af Amer: >60 mL/min (ref >60)
Glucose, Bld: 138 mg/dL — ABNORMAL HIGH (ref 70–99)
Potassium: 4.2 mEq/L (ref 3.5–5.1)
Sodium: 141 mEq/L (ref 135–145)

## 2011-01-11 LAB — URINALYSIS, MICROSCOPIC ONLY
Bilirubin Urine: NEGATIVE
Glucose, UA: NEGATIVE mg/dL
Hgb urine dipstick: NEGATIVE
Ketones, ur: NEGATIVE mg/dL
Leukocytes, UA: NEGATIVE
Nitrite: NEGATIVE
Protein, ur: 30 mg/dL — AB
Specific Gravity, Urine: >1.046 — ABNORMAL HIGH (ref 1.005–1.030)
Urobilinogen, UA: 1 mg/dL (ref 0.0–1.0)
pH: 5.5 (ref 5.0–8.0)

## 2011-01-11 LAB — GLUCOSE, CAPILLARY
Glucose-Capillary: 102 mg/dL — ABNORMAL HIGH (ref 70–99)
Glucose-Capillary: 103 mg/dL — ABNORMAL HIGH (ref 70–99)
Glucose-Capillary: 108 mg/dL — ABNORMAL HIGH (ref 70–99)
Glucose-Capillary: 123 mg/dL — ABNORMAL HIGH (ref 70–99)
Glucose-Capillary: 135 mg/dL — ABNORMAL HIGH (ref 70–99)
Glucose-Capillary: 144 mg/dL — ABNORMAL HIGH (ref 70–99)
Glucose-Capillary: 151 mg/dL — ABNORMAL HIGH (ref 70–99)
Glucose-Capillary: 157 mg/dL — ABNORMAL HIGH (ref 70–99)
Glucose-Capillary: 202 mg/dL — ABNORMAL HIGH (ref 70–99)
Glucose-Capillary: 91 mg/dL (ref 70–99)
Glucose-Capillary: 98 mg/dL (ref 70–99)

## 2011-01-11 LAB — LIPID PANEL
Cholesterol: 101 mg/dL (ref 0–200)
HDL: 29 mg/dL — ABNORMAL LOW (ref >39)
LDL Cholesterol: 51 mg/dL (ref 0–99)
Total CHOL/HDL Ratio: 3.5 RATIO
Triglycerides: 104 mg/dL (ref <150)
VLDL: 21 mg/dL (ref 0–40)

## 2011-01-11 LAB — CK TOTAL AND CKMB (NOT AT ARMC)
CK, MB: 3.3 ng/mL (ref 0.3–4.0)
Relative Index: 2.1 (ref 0.0–2.5)
Total CK: 155 U/L (ref 7–177)

## 2011-01-11 LAB — CARDIAC PANEL(CRET KIN+CKTOT+MB+TROPI)
CK, MB: 2.1 ng/mL (ref 0.3–4.0)
CK, MB: 2.1 ng/mL (ref 0.3–4.0)
Relative Index: 1.7 (ref 0.0–2.5)
Relative Index: 1.8 (ref 0.0–2.5)
Total CK: 118 U/L (ref 7–177)
Total CK: 125 U/L (ref 7–177)
Troponin I: <0.01 ng/mL (ref 0.00–0.06)
Troponin I: <0.01 ng/mL (ref 0.00–0.06)

## 2011-01-11 LAB — POCT I-STAT, CHEM 8
BUN: 14 mg/dL (ref 6–23)
Calcium, Ion: 1.19 mmol/L (ref 1.12–1.32)
Chloride: 103 mEq/L (ref 96–112)
Creatinine, Ser: 0.5 mg/dL (ref 0.4–1.2)
Glucose, Bld: 107 mg/dL — ABNORMAL HIGH (ref 70–99)
HCT: 39 % (ref 36.0–46.0)
Hemoglobin: 13.3 g/dL (ref 12.0–15.0)
Potassium: 3.8 mEq/L (ref 3.5–5.1)
Sodium: 141 mEq/L (ref 135–145)
TCO2: 29 mmol/L (ref 0–100)

## 2011-01-11 LAB — COMPREHENSIVE METABOLIC PANEL
ALT: 19 U/L (ref 0–35)
AST: 25 U/L (ref 0–37)
Albumin: 3.2 g/dL — ABNORMAL LOW (ref 3.5–5.2)
Alkaline Phosphatase: 75 U/L (ref 39–117)
BUN: 12 mg/dL (ref 6–23)
CO2: 29 mEq/L (ref 19–32)
Calcium: 8.4 mg/dL (ref 8.4–10.5)
Chloride: 105 mEq/L (ref 96–112)
Creatinine, Ser: 0.76 mg/dL (ref 0.4–1.2)
GFR calc Af Amer: >60 mL/min (ref >60)
GFR calc non Af Amer: >60 mL/min (ref >60)
Glucose, Bld: 102 mg/dL — ABNORMAL HIGH (ref 70–99)
Potassium: 3.8 mEq/L (ref 3.5–5.1)
Sodium: 141 mEq/L (ref 135–145)
Total Bilirubin: 0.6 mg/dL (ref 0.3–1.2)
Total Protein: 5.8 g/dL — ABNORMAL LOW (ref 6.0–8.3)

## 2011-01-11 LAB — URINE CULTURE: Colony Count: 25000

## 2011-01-11 LAB — D-DIMER, QUANTITATIVE: D-Dimer, Quant: 0.4 ug/mL-FEU (ref 0.00–0.48)

## 2011-01-11 LAB — HEMOGLOBIN A1C
Hgb A1c MFr Bld: 6.5 % — ABNORMAL HIGH (ref 4.6–6.1)
Hgb A1c MFr Bld: 6.5 % — ABNORMAL HIGH (ref 4.6–6.1)
Mean Plasma Glucose: 140 mg/dL
Mean Plasma Glucose: 140 mg/dL

## 2011-01-11 LAB — H. PYLORI ANTIBODY, IGG: H Pylori IgG: 0.8 {ISR}

## 2011-01-11 LAB — MRSA PCR SCREENING: MRSA by PCR: NEGATIVE

## 2011-01-11 LAB — TROPONIN I: Troponin I: <0.01 ng/mL (ref 0.00–0.06)

## 2011-01-11 LAB — TSH: TSH: 1.647 u[IU]/mL (ref 0.350–4.500)

## 2011-02-20 ENCOUNTER — Telehealth: Payer: Self-pay | Admitting: Family Medicine

## 2011-02-20 NOTE — Telephone Encounter (Signed)
Huntley Dec with [employer] called regarding pt and wants return call.  Triage called and I had to speak with Thayer Ohm since Huntley Dec not available.  Wondering if we have received a Rx for diabetic shoes and testing supplies.   PLAN:  Looked through requests that had on desk currently and did not see any request for this specifically.  Donnie Coffin to refax request to our fax #.

## 2011-03-07 NOTE — Cardiovascular Report (Signed)
NAMEAUTUMNE, KALLIO                 ACCOUNT NO.:  [id number]   MEDICAL RECORD NO.:  [id number]          PATIENT TYPE:  INP   LOCATION:  3713                         FACILITY:  [facility]   PHYSICIAN:  Arturo Morton. Riley Kill, MD, FACCDATE OF BIRTH:  Aug 01, 1956   DATE OF PROCEDURE:  DATE OF DISCHARGE:                            CARDIAC CATHETERIZATION   INDICATIONS:  Ms. Juhasz is a 55 year old woman, well-known to me, I care  for her husband and her father-in-law.  She presented with recurrent  chest pain.  Because of her multiple risk factors, she was transferred  for further evaluation.  She was given Lovenox approximately 9 hours  earlier.  Current study was done to assess coronary anatomy.   PROCEDURES:  1. Left heart catheterization  2. Selective coronary arteriography.  3. Selective left ventriculography.   DESCRIPTION OF PROCEDURE:  The patient was brought to the  catheterization laboratory, prepped and draped in the usual fashion.  Through an anterior puncture, the right femoral artery was easily  entered and a 5-French sheath placed.  Views of left and right coronary  arteries were then obtained in multiple angiographic projections.  Central aortic and left ventricular pressures were measured with  pigtail.  Ventriculography was formed in the RAO projection.  There were  no complications.  She tolerated the procedure well and was taken to the  holding area in satisfactory clinical condition.   HEMODYNAMIC DATA:  1. Central aortic pressure 107/65, mean 85.  2. Left ventricular pressure 108/17.  3. No gradient across the aortic valve.   ANGIOGRAPHIC DATA:  1. Ventriculography was done in the RAO projection.  Overall systolic      function is vigorous.  No segmental abnormalities contraction were      identified.  2. The left main is free of critical disease.  3. The left anterior descending artery courses to the apex and      provides a major diagonal coming off proximally, and a  septal      perforator coming off just distal to this.  The LAD is smooth      throughout without significant focal narrowing and the diagonal.      likewise is without critical disease.  4. The circumflex provides a large marginal branch and is without      significant focal narrowing.  5. The right coronary artery provides minimal ostial narrowing.  The      remainder of the vessel is smooth and there is a posterior      descending and posterolateral system without significant focal      disease.   CONCLUSIONS:  1. Well-preserved left ventricular function.  2. No critical focal disease.   PLAN:  The patient had mildly elevated D-dimer.  We will recheck this.      Arturo Morton. Riley Kill, MD, [facility]  Electronically Signed     TDS/MEDQ  D:  03/22/2007  T:  03/22/2007  Job:  [id number]   cc:   Salvadore Farber, MD  [facility]  [facility]
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
## 2011-03-10 NOTE — Op Note (Signed)
[facility]  Patient:    Theresa Scott, Theresa Scott Visit Number: [id number] MRN: [id number]          Service Type: SUR Location: 2S 4782 01 Attending Physician:  Patricia Nettle Dictated by:   Patricia Nettle, M.D. Proc. Date: 11/27/01 Admit Date:  11/27/2001                             Operative Report  DATE OF BIRTH:  [birth year]  PROCEDURES:  1. Revision L5-S1 laminectomy with partial facetectomies, bilateral     foraminotomies, lateral recess decompression of the L5 and S1 nerve root.  2. Posterior lateral interbody fusion with placement of two 9 x 9 x 25     Branigan interbody fusion cages at L5-S1.  3. Posterolateral arthrodesis L5-S1.  4. Posterior spinal instrumentation L5-S1 utilizing the [employer] Dep[employer] Acromed     pedicle screw system.  5. Local autogenous bone graft.  6. Allograft utilizing allomatrix mix 20 cc.  7. Intrathecal injection of preservative free long acting morphine 2 cc.  DIAGNOSES:  1. Degenerative disk disease L5-S1.  2. 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  3. Idiopathic lumbar scoliosis.  4. Type 2 diabetes.  5. Status post left L5-S1 microdiskectomy 1990.  SURGEON:  Patricia Nettle, M.D.  ASSISTANT:  Javier Docker, M.D.  ANESTHESIA:  General endotracheal.  COMPLICATIONS:  None.  INDICATIONS FOR PROCEDURE:  The patient is a 55 year old female who injured her back over one year ago while at work for [employer]. She has seen multiple physicians for this and has failed a long course of conservative therapy including epidural steroid injections and elective nerve root blocks as well as an intradiskal injection. She has tried various pain medications including nonsteroidals and short courses of hydrocodone. She has left leg pain greater than back pain. She does have a 19 degree idiopathic lumbar scoliosis. After a lengthy discussion with her concerning her  surgical options, it was decided to deal with the disease L5-S1 segment where she is status post a microdiskectomy. Plain radiographs showed complete collapse of the disk space with foraminal stenosis on the MRI scan and CT myelogram. She understands that she very well may develop degeneration above in the idiopathic curve and require extension of fusion. The risks and benefits were discussed with the patient and she elected to proceed with L5-S1 revision, decompression and fusion.  DESCRIPTION OF PROCEDURE:  The patient was properly identified in the holding area and taken to the operating room. She underwent general endotracheal anesthesia without difficulty. She was given prophylactic IV antibiotics. Appropriate lines were placed by anesthesia. She was turned prone onto the Acromed four-poster positioning frame. All bony prominences were padded. The face and eyes were protected. The back was prepped and draped in the usual sterile fashion. The previous midline incision at L5-S1 was utilized and extended approximately 5 cm proximally. This was carried down to the deep fascia. The deep fascia was incised and the paraspinal muscles were subperiosteally dissected off of the bone out over the facet joints. Care was taken to protect the L4-5 joint. The L5-S1 joint was debrided of all soft tissue. The transverse process was exposed at L5 an the sacral ala was exposed bilaterally. Intraoperative retrograde confirmed that the marking instruments were on the L5 and S1 level. Extensive scar tissue was encountered on approaching the previous laminotomy  defect on the left at L5. Care was taken not to inadvertently enter the dura. At this point, a laminectomy was done at L5-S1. We started on the right side at L5 avoiding the previous laminotomy. Using [employer] and [employer] punches, the laminectomy was completed centrally. This was carried up to the inferior portion of the L4 lamina.  The ligamentum was undercut beneath the L4 lamina. At this point, we worked our way through the scar tissue on the left side using curettes and micro instruments. Loop magnification and headlight was utilized throughout the procedure. An osteotome was used to do partial facetectomies at L5-S1 exposing the S1 nerve root. A probe was used and the L5-S1 foramen was extremely tight. Foraminotomies were then completed. At this point, the S1 root was carefully dissected free of the scar tissue on the left side and retracted medially. The disk space was identified and then annulotomy was created utilizing a 15 blade. The space was completely collapsed. There were osteophytes off the posterior edge of L5 and these were taken down with an osteotome. At this point, we were able to get an 8 mm dilator into the intervertebral space. This was followed by a 9 mm. We then did a similar procedure on the right side. We then used a 9 mm shaver on the left and removed the endplate and disk material using pituitary rongeurs both straight and upbiting. We had an excellent cleanout. A similar procedure was carried out on the contralateral side. At this point, the right side was dilated up again to 9 mm and the 9 mm broach was utilized and taken to 30 mm. We then packed a 9 x 25 x 25 Branigan cage and placed it in the left side first. We then did a similar procedure on the right side. Before placing the right Branigan cage, we packed autogenous local bone graft that had been previously collected and cleaned into the intervertebral disk space. We sunk the Branigan cages approximately 30 mm. A blunt probe was then utilized to confirm that we had distracted the L5-S1 foramen and we found the nerve root to be free bilaterally. At this point, we directed our attention to placement of pedicle screws at L5 and S1. Using an anatomic probing technique, we identified the pedicle starting point at L5 and first used the awl  followed by the ______ probe. We then palpated the pedicle hole utilizing a ball tip probe and found that we were clearly within bone on  all five sides. We then tapped with a 5.2 tap and again palpated confirming a solid bony path. We also palpated from within the canal confirming no medial or inferior breach. We then placed the pedicle screw 6.25 x 40. We did a similar procedure on the right side. We then placed pedicle screws at S1 bilaterally again using anatomic landmarks and a pedicle probing technique. These were 7.25 x 30 mm screws. At this point, the wound was copiously irrigated. Fluoroscopy was then brought into the field and it was confirmed that the screws were in good position in both the AP and lateral planes. The Branigan cages also were in excellent position with distraction of the disk space. All the bone that was collected during the procedure was cleaned and used for allografting. We decorticated the transverse process of L5 in the sacral ala of S1 bilaterally. The pars was decorticated. We packed autogenous bone graft into the lateral gutter bilaterally. We then placed our rods and the appropriate type  ______. We placed general compression across the rods bilaterally. Over this, we placed 20 cc of allomatrix mix allograft. A Valsalva maneuver was then done and a small pinpoint hole in the dura was noted under the edge of the S1 lamina. There was a flap of ligamentum remaining and this was flipped over the small pinpoint hole after placing to seal. An intrathecal injection of 2 cc of long acting preservative free morphine was then carried out at the L4-5 segment using a 25 gauge spinal needle. Gelfoam was placed over the exposed epidural space. The wound was closed in multiple layers utilizing #1 Vicryl on the deep fascia, followed by #0 Vicryl in the deep subcutaneous layer, 2-0 Vicryl in the superficial subcutaneous layer followed by a running 3-0 nylon locking stitch.  A sterile dressing was applied. The patient was turned supine, extubated and was wiggling her toes and transferred to the recovery room in stable condition. Dictated by:   Patricia Nettle, M.D. Attending Physician:  Patricia Nettle. DD:  11/27/01 TD:  11/28/01 Job: [phone] [id number]

## 2011-03-10 NOTE — Discharge Summary (Signed)
[place]  Patient:    Theresa Scott, Theresa Scott                          MRN: [id number] Adm. Date:  [id number] Disc. Date: [id number] Attending:  Lewayne Bunting Dictator:   Thad Ranger McNeer, P.A.C. CC:         Doylene Canning. Ladona Ridgel, M.D. [facility], [facility]  Saul Fordyce, N.P., [address] [place], [place] [address]   Discharge Summary  DISCHARGE DIAGNOSES: 1. Recurrent supraventricular tachycardia, status post AV node ablation. 2. Hypertension. 3. History of renal calculi.  HOSPITAL COURSE:  The patient was taken to the EP lab on January 11, 2001, by Doylene Canning. Ladona Ridgel, M.D.  The patient was noted to have inducible AV node re-entrant tachycardia.  A total of three radiofrequency energy applications were made to side A in _______ triangle.  After ablation, there was no inducible supraventricular tachycardia despite administration of Isuprel. Post procedure, Dr. Ladona Ridgel noted the patient was stable.  Heart rate was between 75 and 80 with a blood pressure of 130/85.  Her wound site was stable without bleeding.  He felt that she would be stable for discharge the following morning.  DISCHARGE MEDICATIONS: 1. The patient is to resume all prior home medications. 2. Darvocet-N 100 one to two tablets t.i.d. as needed, #24 given.  DISCHARGE INSTRUCTIONS: 1. The patient is advised to follow a low fat diet. 2. She is advised to watch the site for pain, bleeding or swelling and to    call the [place] office for any of these problems. 3. She is to contact Saul Fordyce for follow-up as needed or scheduled. 4. She is to see Dr. Ladona Ridgel in the office in approximately six weeks.  The    office is to call.  She was also told to contact the office in about    two weeks if she has not heard from them. DD:  01/12/01 TD:  01/14/01 Job: [id number]

## 2011-03-10 NOTE — Op Note (Signed)
[facility]  Patient:    Theresa Scott, Theresa Scott Visit Number: [id number] MRN: [id number]          Service Type: DSU Location: DAY Attending Physician:  Dalia Heading Dictated by:   Franky Macho, M.D. Proc. Date: 08/09/01 Admit Date:  08/09/2001   CC:         Dr. Anselm Pancoast. Alison Murray, [place]   Operative Report  PREOPERATIVE DIAGNOSIS: Right breast mass.  POSTOPERATIVE DIAGNOSIS: Right breast mass.  OPERATION/PROCEDURE: Right breast biopsy.  SURGEON: Franky Macho, M.D.  ANESTHESIA: MAC.  INDICATIONS FOR PROCEDURE: The patient is a 55 year old white female, who presents with abnormal microcalcifications in the right breast, directly posterior to a nonpigmented skin mole.  The risks and benefits of the procedure including bleeding and infection were fully explained to the patient, who gave informed consent.  DESCRIPTION OF PROCEDURE: The patient was placed in the supine position and the right breast was prepped and draped using the usual sterile technique with Betadine.  Xylocaine 1% was used for local anesthesia.  An elliptical incision was made around the nonpigmented skin mole which was at the six oclock position in the right breast.  Dissection was taken down to the breast tissue posterior to that.  A representative biopsy was then removed from this region and sent to pathology for further examination along with the ellipse of skin. Any bleeding was controlled using Bovie electrocautery.  The skin was reapproximated using 4-0 Vicryl subcuticular suture.  Steri-Strips and a dry sterile dressing were applied.  Instrument and needle counts were correct at the end of the procedure.  The patient was awakened and transferred to the PACU in stable condition.  COMPLICATIONS: None.  SPECIMEN: Right breast tissue with skin ellipse.  ESTIMATED BLOOD LOSS: Minimal. Dictated by:   Franky Macho, M.D. Attending Physician:  Dalia Heading DD:   08/09/01 TD:  08/10/01 Job: [id number]

## 2011-03-10 NOTE — Discharge Summary (Signed)
[place]  Patient:    Theresa Scott, Theresa Scott Visit Number: [id number] MRN: [id number]          Service Type: DSU Location: DAY Attending Physician:  Dalia Heading Dictated by:   Rollene Rotunda, M.D. [facility] Admit Date:  08/09/2001 Discharge Date: 08/09/2001                             Discharge Summary  DIAGNOSIS:  Chest pain (786.51).  SECONDARY DIAGNOSES: 1. 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 2. Nephrolithiasis. 3. Carpal tunnel syndrome. 4. Cholecystectomy. 5. Tonsillectomy. 6. Laminectomy.  HOSPITAL COURSE:  The patient was admitted on April 25, 2001, with chest pain. She subsequently ruled out for a myocardial infarction.  Evaluation included a Cardiolite on April 25, 2001, which demonstrated an ejection fraction of 52%, apical fixed defect felt to be breast attenuation with no evidence of ischemia.  She also had a CT of the chest and lower extremities which demonstrated no evidence of pulmonary emboli, no signs of mass or adenopathy, and no evidence of a DVT.  Finally she had an echocardiogram which demonstrated normal left ventricular function, no significant valvular abnormalities, no pericardial effusion.  It was felt that her pain was most likely musculoskeletal chest wall pain, and she was discharged the day following admission.  She was having no chest pain on that day.  DISCHARGE MEDICATIONS:  Protonix 40 mg q day, Calcibind, magnesium, birth control pills.  FOLLOW-UP:  The patient is to follow up with Vernon Prey in one week.  Dictated by:   Rollene Rotunda, M.D. [facility] Attending Physician:  Dalia Heading DD:  11/02/01 TD:  11/03/01 Job: [id number]

## 2011-03-10 NOTE — H&P (Signed)
NAME:  Theresa Scott, Theresa Scott                           ACCOUNT NO.:  [id number]   MEDICAL RECORD NO.:  [id number]                   PATIENT TYPE:  INP   LOCATION:  3011                                 FACILITY:  [facility]   PHYSICIAN:  Sharolyn Douglas, M.D.                     DATE OF BIRTH:  16-Mar-1956   DATE OF ADMISSION:  07/22/2003  DATE OF DISCHARGE:  07/27/2003                                HISTORY & PHYSICAL   CHIEF COMPLAINT:  Back and left lower-extremity pain.   HISTORY OF PRESENT ILLNESS:  The patient is a 55 year old female who has  been having severe back and left lower-extremity pain that has failed  conservative management including injection, antiinflammatory medications,  narcotic pain medications, activity modification and physical therapy.  Unfortunately, she continues to have severe pain in her back as well as in  her left lower extremity.  She has had a posterior spinal fusion in the past  at L5-S1 in 2002.  MRI showed degenerative joint disease at the adjacent  segment at L4-L5.  Secondary to her severe findings that they are affecting  her activities of daily living and quality of life, the risks and benefits  of surgery were discussed with the patient by Dr. Sharolyn Douglas as well as  myself, and she indicated understanding and opted to proceed.   ALLERGIES:  NO KNOWN DRUG ALLERGIES.   MEDICATIONS:  1. Hydrochlorothiazide 25 mg p.o. daily.  2. Toprol XL 25 p.o. daily.  3. Aspirin every day.  She is stopping in preparation for surgery.  4. Zeta 10 mg daily.  5. Altace 2.5 mg daily.  6. Darvocet p.r.n.  7. Glucovance 1.25 p.o. daily.  8. Amitriptyline 10 q.h.s.   PAST MEDICAL HISTORY:  1. Diabetes  2. Hypertension  3. Hypercholesterolemia.   PAST SURGICAL HISTORY:  1. Cholecystectomy in 1976.  2. Right carpal tunnel release in 1998.  3. Microdiscectomy of L5-S1 in 1991.  4. Posterior spinal fusion at L5-S1 in 2002.  5. Cardiac ablation in 2000.   SOCIAL HISTORY:   The patient denies tobacco use, denies alcohol use.  She is  married.  She is housewife.  She has one grown child and two grandchildren  at home with her.  Her family will be available to help her through her  postoperative course.   FAMILY MEDICAL HISTORY:  Mother alive with emphysema.  Father deceased at 50  with emphysema and Addison's disease.   REVIEW OF SYSTEMS:  The patient denies fevers, chills, sweats or bleeding  tendencies.  CNS:  Denies blurred vision, double vision, seizure, headache  or paralysis.  Cardiovascular:  No chest pain, angina, orthopnea,  claudication, palpitations. Pulmonary:  Denies shortness of breath,  GI:  Denies nausea or vomiting, constipation, diarrhea, melena, blood in stools.  GU:  Denies dysuria, hematuria or discharge.  Musculoskeletal:  As described  above.  PHYSICAL EXAMINATION:  VITAL SIGNS:  Blood pressure 100/60, respirations 16  and unlabored.  Pulse is 72 and regular.  GENERAL APPEARANCE:  This is a 55 year old white female who is alert and  oriented, in no acute distress.  Patient appears her stated age, appropriate  and cooperative to exam.  HEENT:  Head is normocephalic, atraumatic.  Pupils equal, round and reactive  to light.  Extraocular muscles are intact.  Nose patent.  Pharynx is clear.  NECK:  Supple to palpation.  No lymphadenopathy, thyromegaly or bruits  appreciated.  CHEST:  Clear to auscultation bilaterally, no rales or rhonchi, wheezes, or  friction.  BREAST:  No performed.  HEART:  S1, S2, regular rate and rhythm, no murmur, gallop or rub noted.  ABDOMEN:  Soft to palpation.  She is obese.  Positive bowel sounds,  nontender, nondistended.  GU:  Not pertinent and not performed.  EXTREMITIES:  The patient has left lower-extremity pain.  Pulses, motor  function and sensation are grossly intact.  SKIN:  Intact.  No lesions or rashes.   MRI shows L4-L5 adjacent segment disease.   IMPRESSION:  1. Adjacent segment disease,  L4-L5 with left-sided radiculopathy.  2. Diabetes mellitus.  3. Hypertension.  4. Hypercholesterolemia.   PLAN:  Admit to [facility] on [facility] 22, 2003, for an L4-L5  posterior spinal fusion.  This will be done by Dr. Sharolyn Douglas.      Verlin Fester, P.A.                       Sharolyn Douglas, M.D.    CM/MEDQ  D:  08/14/2003  T:  08/14/2003  Job:  [id number]

## 2011-03-10 NOTE — Op Note (Signed)
   NAME:  Theresa Scott, Theresa Scott                           ACCOUNT NO.:  [id number]   MEDICAL RECORD NO.:  [id number]                   PATIENT TYPE:  INP   LOCATION:  2550                                 FACILITY:  [facility]   PHYSICIAN:  Sharolyn Douglas, M.D.                     DATE OF BIRTH:  09-08-1956   DATE OF PROCEDURE:  07/22/2003  DATE OF DISCHARGE:                                 OPERATIVE REPORT   ADDENDUM:  If not mentioned before, a L4-5 posterior arthrodesis was  performed.                                               Sharolyn Douglas, M.D.    MC/MEDQ  D:  07/22/2003  T:  07/22/2003  Job:  [id number]

## 2011-03-10 NOTE — Op Note (Signed)
NAME:  Theresa Scott, Theresa Scott                           ACCOUNT NO.:  [id number]   MEDICAL RECORD NO.:  [id number]                   PATIENT TYPE:  INP   LOCATION:  2550                                 FACILITY:  [facility]   PHYSICIAN:  Sharolyn Douglas, M.D.                     DATE OF BIRTH:  23-Apr-1956   DATE OF PROCEDURE:  07/22/2003  DATE OF DISCHARGE:                                 OPERATIVE REPORT   DIAGNOSIS:  Adjacent segment degeneration, L4-5, with chronic back pain and  left lower extremity radiculopathy.   PROCEDURES:  1. Removal of L5-S1 pedicle screw instrumentation and exploration of L5-S1     fusion.  2. Revision L4-5 and L5-S1 left laminectomy and facetectomy with     decompression of the left L4 and L5 nerve roots.  3. Transforaminal lumbar interbody fusion, L4-5, with placement of an 11 mm     [employer] Concepts Peek prosthetic cage.  4. Pedicle screw instrumentation, L4-5, utilizing the [employer]     Encompass system.  5. Local autogenous bone graft supplemented with allograft and platelet gel.  6. Neurologic monitoring utilizing free-running and triggered     electromyography with testing of four pedicle screws.   SURGEON:  Sharolyn Douglas, M.D.   ASSISTANT:  Verlin Fester, P.A.   ANESTHESIA:  General endotracheal.   COMPLICATIONS:  None.   INDICATIONS:  The patient is a 55 year old female who is status post L5-S1  decompression and fusion for recurrent disk herniation.  She did well for  some time.  Unfortunately, she has re-developed back and left leg pain.  A  radiograph showed degenerative scoliosis with progressive changes at the  adjacent L4-5 level.  MRI scan shows L4-5 end plate changes and lateral  recess and foraminal narrowing on the left side.  Because of her persistent  symptoms refractory to nonoperative management, she has elected to undergo  extension of her fusion and decompression in hopes of improving her  symptoms.  She understands the risk of  developing adjacent segment problems  at the cephalad levels.   DESCRIPTION OF PROCEDURE:  The patient was properly identified in the  holding area and taken to the operating room.  She underwent general  endotracheal anesthesia without difficulty.  She was given prophylactic IV  antibiotics.  She was carefully turned prone onto the Acromed four poster  positioning frame.  All bony prominences were padded.  Her face and eyes  were protected at all times.  EMG leads were placed for monitoring of free-  running and triggered EMGs.  The back was prepped and draped in the usual  sterile fashion.  The previous midline incision was utilized and extended  approximately 3 cm.  Dissection was carried down through the previous scar.  The deep fascia was incised and the paraspinal muscles were carefully  elevated off of the L3 and L4 spinous  processes.  The L5 spinous process and  lamina had been removed in her previous surgery.  Great care was taken not  to inadvertently enter the spinal canal.  We carefully dissected through the  scar tissue, identifying the L5 and S1 pedicle screws bilaterally.  The L4-5  facet joint was evaluated and found to be very degenerative.  The L4  transverse process was exposed, protecting the L3-4 facet capsule.  Deep  retractors were placed.  We then removed the L5 and S1 instrumentation using  the appropriate screwdrivers and wrenches from the Taylor set.  The L5-S1  fusion appeared to be solid.  The pedicle screw holes were irrigated and  inspected.  They were palpated with a ball tip probe and found to be intact.  We then turned our attention to performing a revision decompression in the  left side.  The epidural fibrosis was carefully resected from the edge of  the L4 lamina using curettes.  A high-speed bur was used to perform a  facetectomy at L4-5 on the left side.  We identified the exiting L4 nerve  root.  The traversing L5 nerve root was completely  decompressed in the  lateral recess.  We then performed a sharp annulotomy at L4-5.  Using  specialized T-LIF instruments, curettes were used to completely remove the  disk across the contralateral side.  The cartilaginous end plates were  scraped.  The disk space was irrigated.  We dilated the disk space up to 11  mm.  The products of decompression from the lamina and facetectomy were  morcellized, mixed with allograft and platelet gel.  We then tightly packed  the bone graft material into the disk space across to the other side and  anterior.  We then placed an 11 mm Peek prosthetic cage which had been  packed with the bone graft into the intervertebral space.  This was  carefully tamped anterior and across the midline.  We confirmed good  positioning with the fluoroscopy.  We then turned our attention to placing  pedicle screws at L4 and L5.  We used anatomic landmarks as well as  fluoroscopy.  We identified the pedicle starting holes at L4.  A pedicle  probe was used to cannulate the pedicle.  We then palpated from within the  pedicle and determined there were no breaches.  The holes were tapped and  then 6.5 x 45 mm screws were placed bilaterally at L4.  At L5 we placed 6.5  x 45 mm screws in the previous holes.  We then stimulated each pedicle screw  using triggered EMGs.  In each case we had a response of greater than 20  milliamps, consistent with interosseous placement.  We then turned our  attention to performing a posterolateral arthrodesis at L4-5.  The high-  speed bur was used to decorticate the pars interarticularis and the  transverse processes of L4.  The previous fusion mass at L5 was  decorticated.  We then tightly packed the bone graft material into the  lateral gutters.  We placed the titanium rods and gentle compression across  the L4-5 segment.  We confirmed good positioning of the pedicle screws using fluoroscopy.  It should be noted that there were no deleterious  changes in  the free-running EMGs throughout the procedure.  The wound was irrigated.  We once again inspected the L4 and L5 nerve roots and found them to be  completely decompressed.  Gelfoam was left over the exposed epidural  space.  A deep Hemovac drain was placed.  The deep fascia closed with a running #1  Vicryl, subcutaneous layer closed with a 2-0 Vicryl, followed by a running 3-  0 nylon suture on the skin.  Sterile dressing applied.  The patient turned  supine and extubated without difficulty and transferred to the recovery room  in stable condition, able to move her upper and lower extremities.                                               Sharolyn Douglas, M.D.    MC/MEDQ  D:  07/22/2003  T:  07/22/2003  Job:  [id number]

## 2011-03-10 NOTE — H&P (Signed)
[facility]  Patient:    Theresa Scott, Theresa Scott Visit Number: [id number] MRN: [id number]          Service Type: Attending:  Patricia Nettle, M.D. Dictated by:   Druscilla Brownie. Shela Nevin, P.A. Adm. Date:  11/27/01   CC:         Rudi Heap, M.D., [facility],  [place], [place]   History and Physical  DATE OF BIRTH:  April 18, 2056  DATE OF HISTORY AND PHYSICAL:  11/21/01  CHIEF COMPLAINT:  "Pain in my back and left leg."  HISTORY OF PRESENT ILLNESS:  This is a 55 year old white female who has been seen by Patricia Nettle, M.D., for continuing and progressive problems concerning her back and left leg.  She has had positive findings on examination with an antalgic gait.  She has diminished ankle reflex on the left compared to the right.  On straight leg raise, she has some posterior left thigh pain.  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  She has also had some mild rotary subluxation of L3-4.  Severe degenerative changes at seen at L5-S1.  Sheran Luz, M.D., who saw her early on, recommended CT scan.  Once these were done, Patricia Nettle, M.D., reviewed both the CT scan and MRI and idiopathic lumbar scoliosis was noted.  Prominent spurs and space narrowing was seen, specifically at the L5-S1 level, and disk protrusion central and to the left at L4-5.  Overall the patient has degenerative disk disease with spinal stenosis and HNPs.  It was felt that she would benefit with surgical intervention.  She is being admitted for L4-5/S1 laminectomy with posterior fusion at L5-S1 with pedicle screws and a possible posterolateral interbody fusion with posterior iliac crest bone graft and allograft.  The patient has donated two units of autologous blood for this procedure and Cell Saver will be used prior to surgery.  The patient has been seen preoperatively by Rudi Heap, M.D., of [facility] and  cleared for surgery.  Her cardiologist is from the [facility] in the [place], [place], office.  PAST SURGICAL HISTORY: 1. Previous back surgery in 1990 with herniated nucleus pulposus in the    lumbar region. 2. Cholecystectomy in 1976. 3. Tonsillectomy in 1970. 4. Benign breast tumors in October of 2002.  PAST MEDICAL HISTORY: 1. Type 2 diabetes. 2. Atrial tachycardia with subsequent ablation and has done quite well after    that.  CURRENT MEDICATIONS: 1. Calcibind for renal calculi.  She has been taking this since 1991 and    fortunately had no renal calculi. 2. Toprol 25 mg one q.d. 3. Magnesium 100 mg one q.d. 4. Birth control pills. 5. GlucoVance 1.25 mg/250 mg one q.d.  ALLERGIES:  No known drug allergies.  FAMILY HISTORY:  Noncontributory.  SOCIAL HISTORY:  The patient is married.  She neither smokes nor drinks.  REVIEW OF SYSTEMS:  CNS:  No seizure disorder, paralysis, numbness, or double vision.  Respiratory:  No productive cough.  No hemoptysis.  No shortness of breath.  Cardiovascular:  No chest pain.  No angina.  No orthopnea. Gastrointestinal:  No nausea, vomiting, melena, or bloody stool. Genitourinary:  No discharge, dysuria, or hematuria, but the patient does have urgency since she began taking her Toprol.  PHYSICAL EXAMINATION:  An alert and cooperative, slightly obese, 55 year old, white female who is accompanied by her husband.  VITAL SIGNS:  Blood pressure 104/60, pulse  62, respirations 16.  HEENT:  Normocephalic.  PERRLA.  EOM intact.  The oropharynx is clear.  CHEST:  Clear to auscultation.  No rhonchi.  No rales.  HEART:  Regular rate and rhythm.  No murmurs are heard.  ABDOMEN:  Soft, obese, and nontender.  Liver and spleen not felt.  RECTAL:  Not done, not pertinent to present illness.  PELVIC:  Not done, not pertinent to present illness.  BREASTS:  Not done, not pertinent to present illness.  EXTREMITIES:  Lower  extremities as in present illness above.  ADMISSION DIAGNOSES: 1. Spinal stenosis with degenerative disk disease and herniated nucleus    pulposus of lumbar spine. 2. Type 2 diabetes. 3. History of atrial tachycardia with ablation.  PLAN:  L4, L5, and S1 lumbar laminectomy with posterior fusion of L5-S1 with pedicle screws and possible posterolateral interbody fusion with posterior iliac crest bone graft and allograft.  Again, she has been cleared preoperatively by Rudi Heap, M.D., for this surgery. Dictated by:   Druscilla Brownie. Shela Nevin, P.A. Attending:  Patricia Nettle, M.D. DD:  11/21/01 TD:  11/21/01 Job: [id number]

## 2011-03-10 NOTE — Procedures (Signed)
[facility]  Patient:    Theresa Scott, Theresa Scott                          MRN: [id number] Proc. Date: 01/11/01 Adm. Date:  [id number] Disc. Date: [id number] Attending:  Lewayne Bunting CC:         [facility], Raynald Kemp, ANP/GNP  Kathrine Cords, R.N. at [facility]   Procedure Report  PROCEDURE PERFORMED:  Electrophysiologic study and RF catheter ablation of inducible AV node reentry tachycardia.  SURGEON:  Doylene Canning. Ladona Ridgel, M.D.  INTRODUCTION:  The patient is a very pleasant 55 year old woman who is referred by Saul Fordyce, nurse practitioner in [place], [place] for evaluation of tachypalpitations.  The patients history dates back approximately five years.  Since then, she has had intermittent episodes of tachypalpitations which would start and stop suddenly, and be terminated with Adenosine in the emergency room.  Her heart rate was over 200 beats per minute.  She is now referred for catheter ablation.  DESCRIPTION OF PROCEDURE:  After informed consent was obtained, the patient was taken to the diagnostic EP lab in a fasting state.  After the usual preparation and draping, intravenous fentanyl and midazolam were given for sedation.  A 6-French hexapolar catheter was inserted percutaneously in the right jugular vein and advanced to the coronary sinus.  A 5-French quadripolar catheter was inserted percutaneously in the right femoral vein and advanced to the His bundle region.  A 5-French quadripolar catheter was inserted percutaneously in the right femoral vein and advanced to the R-V apex.  After measurement at the basic intervals, rapid ventricular pacing was carried out from the R-V apex at a pacing cycle length of 490 msec and step-wise decreased down to 250 msec where V-A Wenckebach was observed.  During rapid ventricular pacing, the atrial activation sequence was midline and decremental.  Next, programmed ventricular  stimulation was carried out from the R-V apex with a basic drive cycle length of 578 msec.  The S1 and S2 interval was step-wise decreased down to 220 msec where ventricular refractoriness was observed.  During programmed ventricular stimulation, the atrial activation sequence was again midline and decremental.  Next, rapid atrial pacing was carried out from the high right atrium at a pacing cycle length at 490 msec and step-wise decreased down to 360 msec where A-V Wenckebach was observed.  During rapid atrial pacing, the P-R interval was equal to the R-R interval.  There was no inducible SVT.  Next, programmed atrial stimulation was carried out from the high right atrium at a basic drive cycle length of 469 msec.  The S1 and S2 interval was step-wise decreased down to 250 msec where the patient had inducible SVT. This was A-V node reentry tachycardia with the earliest atrial activation at the His bundle region.  The V-A interval was 28 msec recorded from the earliest surface ventricular electrogram to the right atrial electrogram. PVCs placed at the time of His bundle refractoriness demonstrated no evidence of atrial preexcitation and ventricular pacing terminated the tachycardia.  At this point, the ablation catheter was maneuvered into Kochs triangle. Three RF energy applications were subsequently delivered, resulting in the creation of accelerated junctional rhythm.  Following this, pacing was carried out both with and without isoproterenol and the patient had no evidence of inducible SVT.  There were intermittent junction echo beats noted.  The patient was subsequently observed for 45 minutes and returned to  her room in satisfactory condition.  COMPLICATIONS:  None.  RESULTS: a. Baseline ECG.  The baseline ECG demonstrates normal sinus rhythm with no    evidence of ventricular preexcitation. b. Baseline intervals.  The sinus node cycle length was 766 msec.  The P-R    interval  167 msec, the A-H interval 72 msec, the H-V interval 50 msec, and    the QRS duration 69 msec. c. Rapid ventricular pacing.  Rapid ventricular pacing was carried out from    the R-V apex demonstrating a VA Wenckebach cycle length fo 250 msec. d. Programmed ventricular stimulation.  Programmed ventricular stimulation was    carried out from the R-V apex at a basic drive cycle length of 578 msec.    The S1 and S2 interval was step-wise decreased down to 220 msec where    ventricular refractoriness was observed. e. Rapid atrial pacing.  Rapid atrial pacing was carried out from the high    right atrium, demonstrating an A-V Wenckebach cycle length of 360 msec. f. Programmed atrial stimulation.  Programmed atrial stimulation was carried    out from the high right atrium at a basic drive cycle length of 469 msec.    The S1 and S2 interval was step-wise decreased down to 250 msec, resulting    in the initiation of SVT. g. Arrhythmia is observed.    1. A-V node reentry tachycardia initiation, programmed atrial stimulation,       duration sustained.  Cycle length of 280 msec.  Termination with rapid       ventricular pacing. h. Mapping.  Mapping of Kochs triangle demonstrated a normal orientation. i. RF energy application.  A total of three RF energy applications were    delivered to site 8 of Kochs triangle which resulted in the creation of    accelerated junctional rhythm.  CONCLUSION:  This study demonstrates inducible atrioventricular node reentry tachycardia, status post successful radiofrequency catheter ablation with a slow pathway modification delivered utilizing three radiofrequency energy applications to site 8 in Kochs triangle.  Following RF energy application, there was no inducible SVT, and minimal residual evidence of slow pathway conduction.  This was demonstrated to be present despite isoproterenol infusion.  There were no immediate procedural complications. DD:  01/11/01 TD:   01/13/01 Job: [id number]

## 2011-03-10 NOTE — Discharge Summary (Signed)
[facility]  Patient:    Theresa Scott, Theresa Scott Visit Number: [id number] MRN: [id number]          Service Type: SUR Location: 4W 0480 01 Attending Physician:  Patricia Nettle Dictated by:   Ottie Glazier. Wynona Neat, P.A.-C. Admit Date:  11/27/2001 Discharge Date: 12/01/2001                             Discharge Summary  ADMITTING DIAGNOSES: 1. Spinal stenosis with degenerative disk disease and herniated nucleus    pulposus of the lumbar spine. 2. History of diabetes mellitus, type 2. 3. History of atrial tachycardia, status post ablation.  DISCHARGE DIAGNOSES: 1. Spinal stenosis with degenerative disk disease and herniated nucleus    pulposus of the lumbar spine, improved. 2. History of diabetes mellitus, type 2. 3. History of atrial tachycardia, status post ablation.  PROCEDURES:  Status post L5-to-S1 lumbar laminectomy with posterior fusion of L5-S1 and pedicle screws, autograft was allograft, per Dr. Patricia Nettle with Dr. Javier Docker assisting.  CONSULTANTS:  None.  BRIEF HISTORY:  Theresa Scott is a 55 year old white female who had been seen and evaluated by Dr. Sharolyn Douglas at [facility] for continued pain in her back as well as her left leg.  Patient had seen Dr. Sheran Luz earlier on with these complaints and was found to have idiopathic lumbar scoliosis as well as prominent spurs and space narrowing at L5-S1 with disk protrusion central and to the left of L4-L5.  Despite conservative therapy, the patients symptomatology continued, therefore, she opted to undergo surgical intervention, which was discussed in great detail by Dr. Noel Gerold.  HOSPITAL COURSE:  On November 27, 2001, patient underwent the above-stated procedure without complications; please see operative report for details. Postop day #1, patient was doing well with no complaints and she was placed on bedrest for that day.  She was noted to have a positive fluid balance  of 3500 cc and was given 20 mg of Lasix IV and a recheck BMET was ordered for the a.m.  On postoperative day #2, the patient was progressing with physical therapy.  She did complain of tingling in her right thigh.  Vital signs were stable.  She was afebrile.  H&H were stable at 10.0 and 29.7.  Examination of the back showed it was clean, dry and intact; the incision was without drainage or any signs of infection.  She had 2+ dorsalis pedis pulses bilaterally.  Sensation was grossly intact to the bilateral lower extremities. Motor was 5/5 bilaterally.  Physical therapy continued to evaluate the patient and recommended a three-in-one commode with [employer] seat and back as well as home health physical therapy and home health occupational therapy. Postoperative day #3, the patient had no complaints, she was comfortable, she had been passing gas early on and she was afebrile.  Examination of the wound showed that it was clean, dry and intact without erythema, no neurovascular changes.  Her diet was progressed and she continued physical therapy.  On December 01, 2001, patient was doing quite well, pain was well-controlled and the patient was ready for discharge.  Vital signs were stable, she was afebrile, neurovascularly she was intact.  Her incision was clean, dry and intact.  FINAL DIAGNOSES: 1. Status post the above procedure. 2. History of diabetes, type 3. 3. History of atrial tachycardia with ablation.  CONDITION ON DISCHARGE:  Improved.  DIET:  Regular.  ACTIVITIES:  She is to keep her dressing clean, dry and intact.  No heavy lifting and bending, back precautions discussed as well as a pamphlet was given to the patient per Dr. Noel Gerold.  DISCHARGE MEDICATIONS:  Percocet 5/325 mg one to two p.o. q.4-6h. p.r.n. pain.  FOLLOWUP:  Followup will be with Dr. Noel Gerold in two weeks; she is to call [phone] for an appointment.  Care Management continued to contact [facility], as they  were unable to reach the workers compensation case manager that day, in order to provide the patient with some home health care. Dictated by:   Ottie Glazier. Wynona Neat, P.A.-C. Attending Physician:  Patricia Nettle. DD:  01/01/02 TD:  01/03/02 Job: [id number]

## 2011-03-10 NOTE — Discharge Summary (Signed)
[place]  Patient:    Theresa Scott                          MRN: [id number] Adm. Date:  [id number] Disc. Date: 04/26/01 Attending:  Rollene Rotunda Dictator:   Lavella Hammock, P.A. CC:         Doylene Canning. Ladona Ridgel, M.D. [facility]  Monica Becton, M.D.   Referring Physician Discharge [place]  DATE OF BIRTH:  2056-09-29  PROCEDURES:  Stress Cardiolite.  HISTORY OF PRESENT ILLNESS:  Theresa Scott is a 55 year old female with no known history of coronary artery disease, who recently had an RF ablation for AV nodal re-entrant tachycardia in March of 2002.  She was admitted on April 25, 2001, for chest pain that radiated to her left arm.  Two weeks ago, she had onset of effort-induced chest pressure that radiated to her left arm as well, but was relieved with rest.  She was admitted on April 25, 2001, for further evaluation and to rule out MI.  HOSPITAL COURSE:  Her enzymes were negative for MI.  Her symptoms were relieved with burping.  The next day, she had a stress Cardiolite on April 26, 2001, where she exercised a total of 5 minutes and 49 seconds and achieved stage III of the Bruce protocol.  She had 4/10 chest pain at 2 minutes and complained of some shortness of breath with left arm tingling during recovery. Her symptoms resolved completely during recovery and her blood pressure maximum was 180/100 during exercise.  She reached a maximum heart rate of 151. Her EKG had some nonspecific ST changes that were considered non-ischemic. The Cardiolite images showed no ischemia, mild apical hypokinesis, and mild anteroapical attenuation which was likely secondary to breast attenuation. Because there was no ischemia, it was felt that the attenuation was likely secondary to breast attenuation.  Her resolved and she was considered stable for discharge on April 25, 2001.  CHEST X-RAY:  No evidence of acute cardiopulmonary disease.  LABORATORY VALUES:  Hemoglobin 12.9,  hematocrit 36.6, WBC 7.8, platelets 231. The sodium was 141, potassium 3.7, chloride 108, CO2 24, BUN 11, creatinine 0.8, glucose 155.  Serial CK-MBs and troponin Is negative for MI. UA negative also.  DISCHARGE CONDITION:  Stable.  CONSULTS:  None.  COMPLICATIONS:  None.  DISCHARGE DIAGNOSES: 1. Chest pain.  No scar or ischemic by Cardiolite and no myocardial infarction    by enzymes. 2. Status post carpal tunnel surgery, cholecystectomy, tonsillectomy and    adenoidectomy, and laminectomy. 3. Echocardiogram this admission showing an ejection fraction of 65% with no    significant valvular abnormalities and no wall motion abnormalities.  DISCHARGE INSTRUCTIONS: 1. Her activity level is to be as tolerated. 2. She is to stick to a low-fat, low-sodium diet. 3. She is to follow up with Monica Becton, M.D., in a week and call for    an appointment.  DISCHARGE MEDICATIONS: 1. Protonix 40 mg q.d. 2. Calcibind daily. 3. Magnesium daily. 4. Birth control pills q.d. DD:  04/26/01 TD:  04/26/01 Job: [id number]

## 2011-03-21 ENCOUNTER — Telehealth: Payer: Self-pay | Admitting: Family Medicine

## 2011-03-21 NOTE — Telephone Encounter (Signed)
Huntley Dec with [employer] called and stated we need to auth a order for diabetic shoes for patient. Plan:  Called and told rep we need them to fax an official request and the providers nurse will review and have provider sign off and send if indeed the patient has requested this.  Rep voiced understanding. Jarvis Newcomer, LPN Domingo Dimes

## 2011-03-22 ENCOUNTER — Telehealth: Payer: Self-pay | Admitting: Family Medicine

## 2011-03-22 NOTE — Telephone Encounter (Signed)
Patient called and a company is going to be faxing an order for diabetic shoes for her and she request that the provider fill out and fax back in on her behalf.  The company has been sending these forms now for 90 days and pt is authorizing. Plan:  Routed to Avon Gully, CMA

## 2011-03-22 NOTE — Telephone Encounter (Signed)
Pt called and stated that she had company fax over an order for diab shoes to Dr. Cathey Endow and it was 3 mths ago and the company had called her and stated no response from our office. Plan:  Told pt to have the company refax the order because 90 days ago is a long time.  Pt voiced understanding. Jarvis Newcomer, LPN Dom[facility] Dimes

## 2011-03-28 ENCOUNTER — Ambulatory Visit: Payer: Medicare Other | Admitting: *Deleted

## 2011-03-31 ENCOUNTER — Encounter: Payer: Medicare Other | Attending: Surgery | Admitting: *Deleted

## 2011-03-31 DIAGNOSIS — Z9884 Bariatric surgery status: Secondary | ICD-10-CM | POA: Insufficient documentation

## 2011-03-31 DIAGNOSIS — Z713 Dietary counseling and surveillance: Secondary | ICD-10-CM | POA: Insufficient documentation

## 2011-03-31 DIAGNOSIS — Z09 Encounter for follow-up examination after completed treatment for conditions other than malignant neoplasm: Secondary | ICD-10-CM | POA: Insufficient documentation

## 2011-04-12 ENCOUNTER — Encounter (INDEPENDENT_AMBULATORY_CARE_PROVIDER_SITE_OTHER): Payer: Self-pay | Admitting: Surgery

## 2011-04-17 ENCOUNTER — Ambulatory Visit: Payer: Medicare Other | Admitting: Family Medicine

## 2011-04-17 ENCOUNTER — Telehealth: Payer: Self-pay | Admitting: Family Medicine

## 2011-04-17 NOTE — Telephone Encounter (Signed)
Pt called and said she and her husband has  appts today and wants to know if both of them have to be fasting. Plan:  Verified the schedule and told pt the husbands appt was a labeled fasting appt, but her appt was not. Jarvis Newcomer, LPN Domingo Dimes

## 2011-05-19 ENCOUNTER — Other Ambulatory Visit: Payer: Self-pay | Admitting: Family Medicine

## 2011-05-19 NOTE — Telephone Encounter (Signed)
Closed. Witney Huie, LPN /Triage  

## 2011-05-22 ENCOUNTER — Other Ambulatory Visit: Payer: Self-pay | Admitting: *Deleted

## 2011-05-25 ENCOUNTER — Other Ambulatory Visit: Payer: Self-pay | Admitting: *Deleted

## 2011-05-25 MED ORDER — ENALAPRIL MALEATE 2.5 MG PO TABS: 2.5000 mg | ORAL_TABLET | Freq: Every day | ORAL | Status: DC

## 2011-05-31 ENCOUNTER — Other Ambulatory Visit: Payer: Self-pay | Admitting: Family Medicine

## 2011-06-21 ENCOUNTER — Other Ambulatory Visit: Payer: Self-pay | Admitting: Rehabilitation

## 2011-06-21 DIAGNOSIS — M545 Low back pain: Secondary | ICD-10-CM

## 2011-06-27 ENCOUNTER — Ambulatory Visit
Admission: RE | Admit: 2011-06-27 | Discharge: 2011-06-27 | Disposition: A | Discharge status: Home / Self Care | Payer: Medicare Other | Source: Ambulatory Visit | Attending: Rehabilitation | Admitting: Rehabilitation

## 2011-06-27 DIAGNOSIS — M545 Low back pain: Secondary | ICD-10-CM

## 2011-07-20 LAB — URINALYSIS, ROUTINE W REFLEX MICROSCOPIC
Bilirubin Urine: NEGATIVE
Glucose, UA: 500 — AB
Hgb urine dipstick: NEGATIVE
Ketones, ur: NEGATIVE
Nitrite: NEGATIVE
Protein, ur: NEGATIVE
Specific Gravity, Urine: 1.013
Urobilinogen, UA: 1
pH: 7

## 2011-07-20 LAB — DIFFERENTIAL
Basophils Absolute: 0
Basophils Relative: 0
Eosinophils Absolute: 0.1
Eosinophils Relative: 1
Lymphocytes Relative: 17
Lymphs Abs: 1.2
Monocytes Absolute: 0.7
Monocytes Relative: 10
Neutro Abs: 5.3
Neutrophils Relative %: 72

## 2011-07-20 LAB — POCT I-STAT, CHEM 8
BUN: 10
Calcium, Ion: 1.06 — ABNORMAL LOW
Chloride: 101
Creatinine, Ser: 0.8
Glucose, Bld: 103 — ABNORMAL HIGH
HCT: 44
Hemoglobin: 15
Potassium: 4.4
Sodium: 135
TCO2: 29

## 2011-07-20 LAB — CBC
HCT: 41.8
Hemoglobin: 14.4
MCHC: 34.5
MCV: 88.4
Platelets: 184
RBC: 4.73
RDW: 13.5
WBC: 7.3

## 2011-07-20 LAB — RAPID STREP SCREEN (MED CTR MEBANE ONLY): Streptococcus, Group A Screen (Direct): NEGATIVE

## 2011-07-21 LAB — DIFFERENTIAL
Basophils Absolute: 0
Basophils Relative: 1
Eosinophils Absolute: 0.2
Eosinophils Relative: 3
Lymphocytes Relative: 22
Lymphs Abs: 1.9
Monocytes Absolute: 0.6
Monocytes Relative: 7
Neutro Abs: 5.9
Neutrophils Relative %: 68

## 2011-07-21 LAB — COMPREHENSIVE METABOLIC PANEL
ALT: 43 — ABNORMAL HIGH
AST: 56 — ABNORMAL HIGH
Albumin: 4
Alkaline Phosphatase: 98
BUN: 9
CO2: 23
Calcium: 8.8
Chloride: 102
Creatinine, Ser: 0.69
GFR calc Af Amer: >60
GFR calc non Af Amer: >60
Glucose, Bld: 160 — ABNORMAL HIGH
Potassium: 3.9
Sodium: 134 — ABNORMAL LOW
Total Bilirubin: 1.3 — ABNORMAL HIGH
Total Protein: 7.1

## 2011-07-21 LAB — URINALYSIS, ROUTINE W REFLEX MICROSCOPIC
Glucose, UA: NEGATIVE
Hgb urine dipstick: NEGATIVE
Ketones, ur: NEGATIVE
Nitrite: NEGATIVE
Protein, ur: NEGATIVE
Specific Gravity, Urine: 1.021
Urobilinogen, UA: 1
pH: 5.5

## 2011-07-21 LAB — LIPASE, BLOOD: Lipase: 29

## 2011-07-21 LAB — CBC
HCT: 43.7
Hemoglobin: 14.7
MCHC: 33.7
MCV: 89.2
Platelets: 212
RBC: 4.9
RDW: 13.5
WBC: 8.7

## 2011-07-21 LAB — URINE MICROSCOPIC-ADD ON

## 2011-07-31 ENCOUNTER — Other Ambulatory Visit (INDEPENDENT_AMBULATORY_CARE_PROVIDER_SITE_OTHER): Payer: Self-pay | Admitting: Surgery

## 2011-07-31 ENCOUNTER — Encounter: Payer: Medicare Other | Attending: Surgery | Admitting: *Deleted

## 2011-07-31 ENCOUNTER — Encounter: Payer: Self-pay | Admitting: *Deleted

## 2011-07-31 DIAGNOSIS — Z713 Dietary counseling and surveillance: Secondary | ICD-10-CM | POA: Insufficient documentation

## 2011-07-31 DIAGNOSIS — Z9884 Bariatric surgery status: Secondary | ICD-10-CM | POA: Insufficient documentation

## 2011-07-31 DIAGNOSIS — Z09 Encounter for follow-up examination after completed treatment for conditions other than malignant neoplasm: Secondary | ICD-10-CM | POA: Insufficient documentation

## 2011-07-31 LAB — COMPREHENSIVE METABOLIC PANEL
ALT: 23 U/L (ref 0–35)
AST: 30 U/L (ref 0–37)
Albumin: 4.2 g/dL (ref 3.5–5.2)
Alkaline Phosphatase: 109 U/L (ref 39–117)
BUN: 12 mg/dL (ref 6–23)
CO2: 27 mEq/L (ref 19–32)
Calcium: 9.3 mg/dL (ref 8.4–10.5)
Chloride: 105 mEq/L (ref 96–112)
Creat: 0.66 mg/dL (ref 0.50–1.10)
Glucose, Bld: 81 mg/dL (ref 70–99)
Potassium: 4.5 mEq/L (ref 3.5–5.3)
Sodium: 143 mEq/L (ref 135–145)
Total Bilirubin: 0.6 mg/dL (ref 0.3–1.2)
Total Protein: 6.5 g/dL (ref 6.0–8.3)

## 2011-07-31 LAB — CBC WITH DIFFERENTIAL/PLATELET
Basophils Absolute: 0 10*3/uL (ref 0.0–0.1)
Basophils Relative: 1 % (ref 0–1)
Eosinophils Absolute: 0.1 10*3/uL (ref 0.0–0.7)
Eosinophils Relative: 2 % (ref 0–5)
HCT: 41.7 % (ref 36.0–46.0)
Hemoglobin: 13.5 g/dL (ref 12.0–15.0)
Lymphocytes Relative: 30 % (ref 12–46)
Lymphs Abs: 2 10*3/uL (ref 0.7–4.0)
MCH: 30 pg (ref 26.0–34.0)
MCHC: 32.4 g/dL (ref 30.0–36.0)
MCV: 92.7 fL (ref 78.0–100.0)
Monocytes Absolute: 0.6 10*3/uL (ref 0.1–1.0)
Monocytes Relative: 10 % (ref 3–12)
Neutro Abs: 3.8 10*3/uL (ref 1.7–7.7)
Neutrophils Relative %: 58 % (ref 43–77)
Platelets: 181 10*3/uL (ref 150–400)
RBC: 4.5 MIL/uL (ref 3.87–5.11)
RDW: 12.5 % (ref 11.5–15.5)
WBC: 6.6 10*3/uL (ref 4.0–10.5)

## 2011-07-31 LAB — IRON AND TIBC
%SAT: 31 % (ref 20–55)
Iron: 100 ug/dL (ref 42–145)
TIBC: 318 ug/dL (ref 250–470)
UIBC: 218 ug/dL (ref 125–400)

## 2011-07-31 LAB — VITAMIN B12: Vitamin B-12: 1385 pg/mL — ABNORMAL HIGH (ref 211–911)

## 2011-07-31 LAB — LIPID PANEL
Cholesterol: 92 mg/dL (ref 0–200)
HDL: 32 mg/dL — ABNORMAL LOW (ref >39)
LDL Cholesterol: 46 mg/dL (ref 0–99)
Total CHOL/HDL Ratio: 2.9 Ratio
Triglycerides: 68 mg/dL (ref <150)
VLDL: 14 mg/dL (ref 0–40)

## 2011-07-31 LAB — HEMOGLOBIN A1C
Hgb A1c MFr Bld: 6 % — ABNORMAL HIGH (ref <5.7)
Mean Plasma Glucose: 126 mg/dL — ABNORMAL HIGH (ref <117)

## 2011-07-31 LAB — FOLATE: Folate: >20 ng/mL

## 2011-07-31 LAB — MAGNESIUM: Magnesium: 1.9 mg/dL (ref 1.5–2.5)

## 2011-07-31 NOTE — Progress Notes (Signed)
  Follow-up visit: 12 Months Post-Operative Gastric Bypass Surgery  Medical Nutrition Therapy:  Appt start time: 0840 end time:  0910.  Assessment:  Primary concerns today: post-operative bariatric surgery nutrition management. Theresa Scott and her husband (both s/p RNY Gastric Bypass) are present today for Theresa Scott's 12 month post-op nutrition follow-up. She reports a "slowed" weight loss over the past 6 months yet is pleased with her total weight lost and her ability to maintain her weight. She has incorporated some "sweets" to her diet yet tries to limit this to small portions, 1-2 times/week. She reports more activity now than she has experienced in the past 10 years. No nutrition related problems to report today per pt or husband.  Weight today: 196.6 lbs Weight change: 7.1 lbs Total weight lost: 80 lbs total BMI: 32.9% Weight goal: 170 lbs  24-hr recall:  B (7-8 AM): egg w/ 1 pc bacon Snk (10  AM): Protein shake (21g protein)   L (12-1 PM): Tuna salad OR Potted meat OR Chicken wrap (1/2) (3-4 oz protein) Snk (PM): N/A  D (6 PM): Pinto beans (1/2 c) w/ corn bread OR Hamburger patty (3 oz) w/ 1/2 cup green beans Snk (9 PM): ice cream (2-3 times/week, 1 cup)  Fluid intake: G2 gatorade, water, protein shake, unsweetened tea, coffee = 64 oz Estimated total protein intake: 50-60g  Medications: See updated list Supplementation: MVI + Calcium Citrate + B12  CBG monitoring: Daily Average CBG per patient: 70-95 (FBS), Pt reports sporadic episodes of hypoglycemia Last patient reported A1c: 6.0%  Lab Results  Component Value Date   HGBA1C 6.0 12/15/2010   Using straws: No Drinking while eating:No Hair loss: No Carbonated beverages: No N/V/D/C: Yes, pt reports episodes of regurgitation when overeats Dumping syndrome: None reported  Recent physical activity:  Walking/Swimming @ [employer] 3 times/week for 60-90 mins  Progress Towards Goal(s):  In progress.   Nutritional Diagnosis:  [place]-3.3  Overweight/obesity As related to recent Gastric Bypass surgery.  As evidenced by pt following Gastric Bypass dietary guidelines with increased exercise for continued weight loss.    Intervention:  Nutrition education.  Monitoring/Evaluation:  Dietary intake, exercise, lap band fills, and body weight. Follow up PRN.

## 2011-07-31 NOTE — Patient Instructions (Signed)
Goals:  Follow Phase 3B: High Protein + Non-Starchy Vegetables  Eat 3-6 small meals/snacks, every 3-5 hrs  Increase lean protein foods to meet 60-80g goal  Increase fluid intake to 64oz +  Add 15 grams of carbohydrate (fruit, whole grain, starchy vegetable) with meals  Avoid drinking 15 minutes before, during and 30 minutes after eating  Aim for >30 min of physical activity daily  

## 2011-08-07 LAB — VITAMIN B1: Vitamin B1 (Thiamine): 19

## 2011-08-15 ENCOUNTER — Telehealth: Payer: Self-pay | Admitting: *Deleted

## 2011-08-15 ENCOUNTER — Ambulatory Visit (INDEPENDENT_AMBULATORY_CARE_PROVIDER_SITE_OTHER): Payer: Medicare Other | Admitting: Family Medicine

## 2011-08-15 ENCOUNTER — Encounter: Payer: Self-pay | Admitting: Family Medicine

## 2011-08-15 VITALS — BP 118/76 | HR 78 | Wt 195.0 lb

## 2011-08-15 DIAGNOSIS — Z23 Encounter for immunization: Secondary | ICD-10-CM

## 2011-08-15 DIAGNOSIS — I10 Essential (primary) hypertension: Secondary | ICD-10-CM

## 2011-08-15 DIAGNOSIS — E785 Hyperlipidemia, unspecified: Secondary | ICD-10-CM

## 2011-08-15 DIAGNOSIS — R21 Rash and other nonspecific skin eruption: Secondary | ICD-10-CM

## 2011-08-15 DIAGNOSIS — M722 Plantar fascial fibromatosis: Secondary | ICD-10-CM

## 2011-08-15 MED ORDER — ENALAPRIL MALEATE 2.5 MG PO TABS: 2.5000 mg | ORAL_TABLET | Freq: Every day | ORAL | Status: DC

## 2011-08-15 MED ORDER — AMITRIPTYLINE HCL 25 MG PO TABS: 25.0000 mg | ORAL_TABLET | Freq: Every day | ORAL | Status: DC

## 2011-08-15 MED ORDER — OMEPRAZOLE 20 MG PO CPDR: 20.0000 mg | DELAYED_RELEASE_CAPSULE | Freq: Every day | ORAL | Status: DC

## 2011-08-15 NOTE — Telephone Encounter (Signed)
Pt is switching rx to mail order pharm

## 2011-08-15 NOTE — Patient Instructions (Signed)
Start the stretches and icing for you foot, plantar fascitis. If not better in 3-4 weeks then call the office and we can make a podiatry referral

## 2011-08-15 NOTE — Progress Notes (Signed)
Subjective:    Patient ID: Theresa Scott, female    DOB: 01/22/1956, 55 y.o.   MRN: [id number]  Hypertension This is a chronic problem. The problem is unchanged. The problem is controlled. Pertinent negatives include no chest pain or shortness of breath. There are no associated agents to hypertension. Past treatments include ACE inhibitors. The current treatment provides moderate improvement. There are no compliance problems.   She is getting about 3-4 days per week  She is trying to wean off he PPI. She is taking it every other day.  She has lost 105 since her bariatric surgery.   Right heel pain for abut a month. Worse in the morning and when sits for while and then gets up. No injury and never happened before.  Feels like stepped on a rock. She is on chronic pain eds. no alleviating symptoms. She has not tried icing it. It has been persistent and daily.  RAsh for 3 weeks, very itchy. No change in fabric softener, soap.  Using hydrocortisone cream and allergy tabs.    Review of Systems  Respiratory: Negative for shortness of breath.   Cardiovascular: Negative for chest pain.       BP 118/76  Pulse 78  Wt 195 lb (88.451 kg)    Allergies  Allergen Reactions  . Loratadine     REACTION: dizziness    Past Medical History  Diagnosis Date  . Chronic kidney disease   . Diabetes mellitus   . Hypertension     Past Surgical History  Procedure Date  . Tonsillectomy 1971  . Cholecystectomy 1976  . Hand surgery 2000    right  . Back surgery 1990,2003,2004,2010  . Cardiac surgery 2002  . Cardiac catheterization 2011    [facility].     History   Social History  . Marital Status: Married    Spouse Name: N/A    Number of Children: N/A  . Years of Education: N/A   Occupational History  . Not on file.   Social History Main Topics  . Smoking status: Never Smoker   . Smokeless tobacco: Not on file  . Alcohol Use: No  . Drug Use:   . Sexually Active:    Other  Topics Concern  . Not on file   Social History Narrative  . No narrative on file    No family history on file.  Ms. Ponzo does not currently have medications on file.  Objective:   Physical Exam  Constitutional: She is oriented to person, place, and time. She appears well-developed and well-nourished.  HENT:  Head: Normocephalic and atraumatic.  Neck: Neck supple.  Cardiovascular: Normal rate, regular rhythm and normal heart sounds.        No carotid or abdominal bruits.   Pulmonary/Chest: Effort normal and breath sounds normal.  Musculoskeletal:       Right foot with NROM. No laxity of the joint.  Tender at the base of the heel near the arch. Strength 5/5 in all directions for the ankle.   Neurological: She is alert and oriented to person, place, and time.  Skin: Skin is warm and dry.  Psychiatric: She has a normal mood and affect. Her behavior is normal.      Right foot    Assessment & Plan:  HTN- Looks great. At goal.   Hyperlipidemia - I really think she could come her statin. I will discuss with her cardiologist.   Platar faciitis - Discussed dx. Given H.O. And reviewed  stretching and icing. If not better in 3-4 weeks then call and will refer to podiatry for possible injections/splinting.   Rash unclear etiology - it does not look consistent with a drug rash. She does have CBC couple weeks ago while she was experiencing the rash and it was normal. Eosinophils were normal as well. At this point I recommended she wait about one more week. She states her husband had a similar rash before she did that has resolved after a week. If she is still having problems in one week will make a dermatology referral.

## 2011-08-23 ENCOUNTER — Encounter (INDEPENDENT_AMBULATORY_CARE_PROVIDER_SITE_OTHER): Payer: Self-pay

## 2011-08-24 ENCOUNTER — Ambulatory Visit (INDEPENDENT_AMBULATORY_CARE_PROVIDER_SITE_OTHER): Payer: Medicare Other | Admitting: Surgery

## 2011-09-29 ENCOUNTER — Ambulatory Visit (INDEPENDENT_AMBULATORY_CARE_PROVIDER_SITE_OTHER): Payer: Medicare Other | Admitting: Surgery

## 2011-11-03 ENCOUNTER — Encounter (INDEPENDENT_AMBULATORY_CARE_PROVIDER_SITE_OTHER): Payer: Self-pay | Admitting: Surgery

## 2011-11-03 ENCOUNTER — Ambulatory Visit (INDEPENDENT_AMBULATORY_CARE_PROVIDER_SITE_OTHER): Payer: Medicare Other | Admitting: Surgery

## 2011-11-03 VITALS — BP 124/75 | HR 72 | Ht 65.0 in | Wt 188.0 lb

## 2011-11-03 DIAGNOSIS — Z9884 Bariatric surgery status: Secondary | ICD-10-CM | POA: Insufficient documentation

## 2011-11-03 NOTE — Progress Notes (Signed)
[facility]  Ovidio Kin, MD,  FACS [address] [place]  [address] [place], [place]    [address] Phone:  [phone] FAX:  [phone]   Re:   Theresa Scott DOB:   Sep 10, 1956 MRN:   [id number]  ASSESSMENT AND PLAN: 1.  RYGB - 08/22/2010.  Initial weight - 267, BMI 44.5.  She is doing very well and happy with her weight loss and improvement in medical conditions.  I want to see her on an annual basis.  We talked about me seeing her husband also.  2.  GERD. 3.  Hypertension. Off meds. 4.  Hypercholesterolemia.  Off meds. 5.  Diabetes mellitus.  Off meds. 6.  Degenerative back disease.  On hydrocodone daily or every other day. 7.  History of depression - on meds 8.  History of kidney stones.  HISTORY OF PRESENT ILLNESS: No chief complaint on file.   Theresa Scott is a 56 y.o. (DOB: [birth year])  white female who is a patient of [name],CATHERINE, MD, MD (she did see Dr. Seymour Bars, but Dr. Cathey Endow is now doing bariatrics with ??) and comes to me today for RYGB.  I see her husband, Janaisa Birkland ([id number]).  She is doing very well.  She comes with her grandson.  She ran in the [employer].  No abdominal pain, no change in bowels.  She as been very happy with her results.  She thinks she was in the 290's before surgery.  I reviewed her labs.  All are good.  She can decrease her Vit B 12, if she wants.  PHYSICAL EXAM: BP 124/75  Pulse 72  Ht 5\' 5"  (1.651 m)  Wt 188 lb (85.276 kg)  BMI 31.28 kg/m2  General: WN WF who is alert and generally healthy appearing.  HEENT: Normal. Pupils equal. Good dentition. Neck: Supple. No mass.  No thyroid mass.  Carotid pulse okay with no bruit. Lymph Nodes:  No supraclavicular or cervical nodes. Lungs: Clear to auscultation and symmetric breath sounds. Heart:  RRR. No murmur or rub. Abdomen: Soft. No mass. No tenderness. No hernia. Normal bowel sounds.  RUQ scar.  Laparoscopic scars.  Extremities:  Good strength and  ROM  in upper and lower extremities. Neurologic:  Grossly intact to motor and sensory function. Psychiatric: Has normal mood and affect. Behavior is normal.    DATA REVIEWED: Cholesterol - 92, Fe - 100 HgbA1C - 6.0 Vit B12 - 1385   Ovidio Kin, MD, FACS Office:  [phone]

## 2011-12-18 ENCOUNTER — Other Ambulatory Visit: Payer: Self-pay | Admitting: *Deleted

## 2011-12-18 MED ORDER — ENALAPRIL MALEATE 2.5 MG PO TABS: 2.5000 mg | ORAL_TABLET | Freq: Every day | ORAL | Status: DC

## 2012-01-08 ENCOUNTER — Other Ambulatory Visit: Payer: Self-pay | Admitting: *Deleted

## 2012-01-08 MED ORDER — AMITRIPTYLINE HCL 25 MG PO TABS: 25.0000 mg | ORAL_TABLET | Freq: Every day | ORAL | Status: DC

## 2012-02-06 ENCOUNTER — Encounter: Payer: Self-pay | Admitting: Family Medicine

## 2012-02-06 ENCOUNTER — Ambulatory Visit (INDEPENDENT_AMBULATORY_CARE_PROVIDER_SITE_OTHER): Payer: Medicare Other | Admitting: Family Medicine

## 2012-02-06 VITALS — BP 92/55 | HR 68 | Temp 97.9°F | Ht 65.0 in | Wt 192.0 lb

## 2012-02-06 DIAGNOSIS — I10 Essential (primary) hypertension: Secondary | ICD-10-CM

## 2012-02-06 DIAGNOSIS — J029 Acute pharyngitis, unspecified: Secondary | ICD-10-CM

## 2012-02-06 DIAGNOSIS — R7301 Impaired fasting glucose: Secondary | ICD-10-CM

## 2012-02-06 DIAGNOSIS — R748 Abnormal levels of other serum enzymes: Secondary | ICD-10-CM

## 2012-02-06 LAB — COMPLETE METABOLIC PANEL WITH GFR
ALT: 23 U/L (ref 0–35)
AST: 35 U/L (ref 0–37)
Albumin: 4.2 g/dL (ref 3.5–5.2)
Alkaline Phosphatase: 108 U/L (ref 39–117)
BUN: 12 mg/dL (ref 6–23)
CO2: 31 mEq/L (ref 19–32)
Calcium: 8.9 mg/dL (ref 8.4–10.5)
Chloride: 106 mEq/L (ref 96–112)
Creat: 0.6 mg/dL (ref 0.50–1.10)
GFR, Est African American: >89 mL/min
GFR, Est Non African American: >89 mL/min
Glucose, Bld: 112 mg/dL — ABNORMAL HIGH (ref 70–99)
Potassium: 4.3 mEq/L (ref 3.5–5.3)
Sodium: 143 mEq/L (ref 135–145)
Total Bilirubin: 0.4 mg/dL (ref 0.3–1.2)
Total Protein: 6.3 g/dL (ref 6.0–8.3)

## 2012-02-06 LAB — POCT RAPID STREP A (OFFICE): Rapid Strep A Screen: NEGATIVE

## 2012-02-06 LAB — POCT GLYCOSYLATED HEMOGLOBIN (HGB A1C): Hemoglobin A1C: 5.9

## 2012-02-06 NOTE — Progress Notes (Signed)
  Subjective:    Patient ID: Theresa Scott, female    DOB: 1955/11/13, 56 y.o.   MRN: [id number]  HPI ST x 1-2 week with white spots.  + HA.  No fever.  Has been around school children.  No other cold sxs. Some nausea.  Some sneezing. Feels painful to swallow.   HTN- no CP or SOB.  History of gastric bypass. She is still taking her enalapril her regularly. She does occasionally feel tired. No dizziness.   Review of Systems     Objective:   Physical Exam  Constitutional: She is oriented to person, place, and time. She appears well-developed and well-nourished.  HENT:  Head: Normocephalic and atraumatic.  Right Ear: External ear normal.  Left Ear: External ear normal.  Nose: Nose normal.       TMs and canals are clear.  OP is misly erythematous with no white spots.   Eyes: Conjunctivae and EOM are normal. Pupils are equal, round, and reactive to light.  Neck: Neck supple. No thyromegaly present.  Cardiovascular: Normal rate, regular rhythm and normal heart sounds.   Pulmonary/Chest: Effort normal and breath sounds normal. She has no wheezes.  Lymphadenopathy:    She has no cervical adenopathy.  Neurological: She is alert and oriented to person, place, and time.  Skin: Skin is warm and dry.  Psychiatric: She has a normal mood and affect.          Assessment & Plan:  Hypertension - Will stop the enalapril. I will remove high blood pressure from her problem list. F/U in 6 months.   IFG- A1C down to 5.9. Repeat in 6 months.  If we can get her A1c under 5.7 and then she will be back to normal.  Pharyngitis - Discussed rapid strep is neg.  Throat culture sent. Likely viral. If not better by the time we get culture consider ENT referral. OP does apear dry. Make sure drinking plenty of fluids.

## 2012-02-08 ENCOUNTER — Telehealth: Payer: Self-pay | Admitting: *Deleted

## 2012-02-08 LAB — STREP A DNA PROBE: GASP: NEGATIVE

## 2012-02-09 ENCOUNTER — Ambulatory Visit (INDEPENDENT_AMBULATORY_CARE_PROVIDER_SITE_OTHER): Payer: Medicare Other | Admitting: Family Medicine

## 2012-02-09 ENCOUNTER — Encounter: Payer: Self-pay | Admitting: *Deleted

## 2012-02-09 ENCOUNTER — Encounter: Payer: Self-pay | Admitting: Family Medicine

## 2012-02-09 VITALS — BP 110/75 | HR 73 | Temp 98.3°F | Ht 65.0 in | Wt 191.0 lb

## 2012-02-09 DIAGNOSIS — R1310 Dysphagia, unspecified: Secondary | ICD-10-CM

## 2012-02-09 DIAGNOSIS — A084 Viral intestinal infection, unspecified: Secondary | ICD-10-CM

## 2012-02-09 DIAGNOSIS — R300 Dysuria: Secondary | ICD-10-CM

## 2012-02-09 DIAGNOSIS — J029 Acute pharyngitis, unspecified: Secondary | ICD-10-CM

## 2012-02-09 LAB — POCT URINALYSIS DIPSTICK
Bilirubin, UA: NEGATIVE
Blood, UA: NEGATIVE
Glucose, UA: NEGATIVE
Ketones, UA: NEGATIVE
Nitrite, UA: NEGATIVE
Protein, UA: NEGATIVE
Spec Grav, UA: 1.025
Urobilinogen, UA: 1
pH, UA: 6

## 2012-02-09 MED ORDER — SULFAMETHOXAZOLE-TRIMETHOPRIM 800-160 MG PO TABS: 1.0000 | ORAL_TABLET | Freq: Two times a day (BID) | ORAL | Status: DC

## 2012-02-09 NOTE — Progress Notes (Signed)
  Subjective:    Patient ID: Theresa Scott, female    DOB: 1956-01-31, 56 y.o.   MRN: [id number]  HPI Having burning with urintion that started last night.  No fever. No frequency.  No back pain.  Has only ever had 2 UTIs i her life and one she says she was hospitalized for a period  Started vomiting yesterday. + sick contact.  + diarrhea too.   Grandson who stays with them has also had some vomiting but no diarrhea. No fever.  She is no longer vomiting and is a little bit better today but just feels very weak and tired. The diarrhea has improved as well too. She always has a baseline of mild diarrhea since having her gastric bypass surgery.  She still has a sore throat.Stil some pain when she swallows.  Notices discomfort when she eats.  Maybe a little better. Feels more on the right side of her neck.    Review of Systems     Objective:   Physical Exam  Constitutional: She is oriented to person, place, and time. She appears well-developed and well-nourished.       Appears fatigued.   HENT:  Head: Normocephalic and atraumatic.  Nose: Nose normal.  Mouth/Throat: Oropharynx is clear and moist.       TMs and canals are clear.   Eyes: Conjunctivae and EOM are normal. Pupils are equal, round, and reactive to light.  Neck: Neck supple. No thyromegaly present.  Cardiovascular: Normal rate, regular rhythm and normal heart sounds.   Pulmonary/Chest: Effort normal and breath sounds normal. She has no wheezes.  Abdominal: Soft. Bowel sounds are normal. She exhibits no distension and no mass. There is no tenderness. There is no rebound and no guarding.  Lymphadenopathy:    She has no cervical adenopathy.  Neurological: She is alert and oriented to person, place, and time.  Skin: Skin is warm and dry.  Psychiatric: She has a normal mood and affect.          Assessment & Plan:  UTI - will go and treat with Bactrim twice a day for 3 days. Call if her symptoms do not resolve. She may also have  some external irritation from her recent diarrhea, but she denies any type of rash.  ST -she still has persistent soreness on the right side of her neck and her throat that is worsened by swallowing food. I will refer her to ENT. If it clears up between now and then and we can cancel the appointment.  Vomiting -likely viral gastroenteritis-symptomatic treatment. Work on rehydrating herself. She is no longer vomiting today. She needs to make sure she is hydrating well. I do not think this is a complication of a sore throat.

## 2012-02-12 ENCOUNTER — Ambulatory Visit: Payer: Medicare Other | Admitting: Family Medicine

## 2012-02-13 ENCOUNTER — Ambulatory Visit: Payer: Medicare Other | Admitting: Family Medicine

## 2012-02-14 ENCOUNTER — Other Ambulatory Visit: Payer: Self-pay | Admitting: *Deleted

## 2012-02-14 MED ORDER — SULFAMETHOXAZOLE-TRIMETHOPRIM 800-160 MG PO TABS: 1.0000 | ORAL_TABLET | Freq: Two times a day (BID) | ORAL | Status: AC

## 2012-03-04 ENCOUNTER — Telehealth: Payer: Self-pay | Admitting: *Deleted

## 2012-03-04 MED ORDER — BETAMETHASONE DIPROPIONATE 0.05 % EX OINT: TOPICAL_OINTMENT | Freq: Two times a day (BID) | CUTANEOUS | Status: DC

## 2012-03-04 NOTE — Telephone Encounter (Signed)
Pt informed

## 2012-03-04 NOTE — Telephone Encounter (Signed)
Pt called and states that she is has gotten into some poison oak and states she has to have medication called in to get rid of it. Please advise.

## 2012-03-04 NOTE — Telephone Encounter (Signed)
SEnt over rx for topical steroid ointment. If not helping them let me know. Make sure to wash all exposed clothes and fabric in hot water to get rid of the plant oils off the clothing.

## 2012-03-11 ENCOUNTER — Telehealth: Payer: Self-pay | Admitting: *Deleted

## 2012-03-11 NOTE — Telephone Encounter (Signed)
Ordered med from [employer] for pt. Order number is [id number]

## 2012-04-08 ENCOUNTER — Other Ambulatory Visit: Payer: Self-pay | Admitting: *Deleted

## 2012-04-08 MED ORDER — OMEPRAZOLE 20 MG PO CPDR: 20.0000 mg | DELAYED_RELEASE_CAPSULE | Freq: Every day | ORAL | Status: DC

## 2012-04-22 ENCOUNTER — Other Ambulatory Visit: Payer: Self-pay | Admitting: Physical Medicine and Rehabilitation

## 2012-04-30 ENCOUNTER — Other Ambulatory Visit: Payer: Self-pay | Admitting: Physical Medicine and Rehabilitation

## 2012-06-14 ENCOUNTER — Telehealth: Payer: Self-pay | Admitting: *Deleted

## 2012-06-14 NOTE — Telephone Encounter (Signed)
Pt wants a refill on pristiq sent to mail order but it looks like we have never filled this for her before.please advise

## 2012-06-14 NOTE — Telephone Encounter (Signed)
She needs to contact provider who fills this for her.

## 2012-06-17 NOTE — Telephone Encounter (Signed)
We last filled this in 2011. Pt advised to schedule appt for f/u for this med

## 2012-06-20 ENCOUNTER — Ambulatory Visit (INDEPENDENT_AMBULATORY_CARE_PROVIDER_SITE_OTHER): Payer: Medicare Other | Admitting: Family Medicine

## 2012-06-20 ENCOUNTER — Encounter: Payer: Self-pay | Admitting: Family Medicine

## 2012-06-20 VITALS — BP 114/75 | HR 68 | Wt 197.0 lb

## 2012-06-20 DIAGNOSIS — E1142 Type 2 diabetes mellitus with diabetic polyneuropathy: Secondary | ICD-10-CM

## 2012-06-20 DIAGNOSIS — R7301 Impaired fasting glucose: Secondary | ICD-10-CM | POA: Insufficient documentation

## 2012-06-20 DIAGNOSIS — E1149 Type 2 diabetes mellitus with other diabetic neurological complication: Secondary | ICD-10-CM

## 2012-06-20 DIAGNOSIS — F329 Major depressive disorder, single episode, unspecified: Secondary | ICD-10-CM

## 2012-06-20 DIAGNOSIS — Z9884 Bariatric surgery status: Secondary | ICD-10-CM

## 2012-06-20 MED ORDER — DESVENLAFAXINE SUCCINATE ER 50 MG PO TB24: 50.0000 mg | ORAL_TABLET | Freq: Every day | ORAL | Status: DC

## 2012-06-20 NOTE — Progress Notes (Signed)
  Subjective:    Patient ID: Theresa Scott, female    DOB: 1956-07-09, 56 y.o.   MRN: [id number]  HPI Here for depression - Was on pristiq and doing well.  Gets it directly from the drug company. Evidnely even though no documentation in the sysyterm for refills since 2011 we have been sending forms for refills throught the drug company.  Used to be on 50mg  but when was going through a lot of stress her dose was increased. She thinks she is ready to decrease her dose.    Also had nurse from [employer] company come out to her house. They had 3 recommendations. One is to check a vitamin D level. The second was to consider aspirin therapy, the third was to discuss getting diabetic shoes. She currently has a tear in the work fantastic for her. 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 She also reports some callus formation.   Review of Systems     Objective:   Physical Exam  Constitutional: She is oriented to person, place, and time. She appears well-developed and well-nourished.  HENT:  Head: Normocephalic and atraumatic.  Cardiovascular: Normal rate, regular rhythm and normal heart sounds.   Pulmonary/Chest: Effort normal and breath sounds normal.  Neurological: She is alert and oriented to person, place, and time.  Skin: Skin is warm and dry.  Psychiatric: She has a normal mood and affect. Her behavior is normal.          Assessment & Plan:  Depression- we discussed different options. She's is doing so well right now to try to decrease her Pristiq dose back down to 50. We will send a new prescription to the pharmaceutical company.  History a gastric bypass surgery-we'll check vitamin D levels as we have never done this for her before.  As far as aspirin therapy is concerned I recommend not taking it. She is technically no longer diabetic and I worry with her gastric bypass that might put her at increased risk of GI ulcer because her gut transit time is slowed down. At  this point time as she becomes higher risk I recommend avoiding this.  Peripheral neuropathy from diabetes-I. think she'll be great candidate for a new pair of diabetic shoes. Will be happy to fill her prescription for her she wants to send it to our office.  She has a followup appointment in October.

## 2012-06-21 ENCOUNTER — Other Ambulatory Visit: Payer: Self-pay | Admitting: Family Medicine

## 2012-06-21 LAB — VITAMIN D 25 HYDROXY (VIT D DEFICIENCY, FRACTURES): Vit D, 25-Hydroxy: 36 ng/mL (ref 30–89)

## 2012-06-21 MED ORDER — ENALAPRIL MALEATE 2.5 MG PO TABS: 2.5000 mg | ORAL_TABLET | Freq: Every day | ORAL | Status: DC

## 2012-06-22 ENCOUNTER — Emergency Department (HOSPITAL_COMMUNITY)
Admission: EM | Admit: 2012-06-22 | Discharge: 2012-06-22 | Disposition: A | Discharge status: Home / Self Care | Payer: Medicare Other | Attending: Emergency Medicine | Admitting: Emergency Medicine

## 2012-06-22 ENCOUNTER — Encounter (HOSPITAL_COMMUNITY): Payer: Self-pay | Admitting: *Deleted

## 2012-06-22 DIAGNOSIS — E119 Type 2 diabetes mellitus without complications: Secondary | ICD-10-CM | POA: Insufficient documentation

## 2012-06-22 DIAGNOSIS — F3289 Other specified depressive episodes: Secondary | ICD-10-CM | POA: Insufficient documentation

## 2012-06-22 DIAGNOSIS — K219 Gastro-esophageal reflux disease without esophagitis: Secondary | ICD-10-CM | POA: Insufficient documentation

## 2012-06-22 DIAGNOSIS — E7800 Pure hypercholesterolemia, unspecified: Secondary | ICD-10-CM | POA: Insufficient documentation

## 2012-06-22 DIAGNOSIS — N189 Chronic kidney disease, unspecified: Secondary | ICD-10-CM | POA: Insufficient documentation

## 2012-06-22 DIAGNOSIS — IMO0002 Reserved for concepts with insufficient information to code with codable children: Secondary | ICD-10-CM | POA: Insufficient documentation

## 2012-06-22 DIAGNOSIS — F329 Major depressive disorder, single episode, unspecified: Secondary | ICD-10-CM | POA: Insufficient documentation

## 2012-06-22 DIAGNOSIS — Z9884 Bariatric surgery status: Secondary | ICD-10-CM | POA: Insufficient documentation

## 2012-06-22 DIAGNOSIS — T63441A Toxic effect of venom of bees, accidental (unintentional), initial encounter: Secondary | ICD-10-CM

## 2012-06-22 DIAGNOSIS — I129 Hypertensive chronic kidney disease with stage 1 through stage 4 chronic kidney disease, or unspecified chronic kidney disease: Secondary | ICD-10-CM | POA: Insufficient documentation

## 2012-06-22 DIAGNOSIS — Z79899 Other long term (current) drug therapy: Secondary | ICD-10-CM | POA: Insufficient documentation

## 2012-06-22 DIAGNOSIS — T63461A Toxic effect of venom of wasps, accidental (unintentional), initial encounter: Secondary | ICD-10-CM | POA: Insufficient documentation

## 2012-06-22 DIAGNOSIS — T6391XA Toxic effect of contact with unspecified venomous animal, accidental (unintentional), initial encounter: Secondary | ICD-10-CM | POA: Insufficient documentation

## 2012-06-22 MED ORDER — HYDROCODONE-ACETAMINOPHEN 5-325 MG PO TABS
1.0000 | ORAL_TABLET | Freq: Once | ORAL | Status: AC
  Administered 2012-06-22: 1 via ORAL
  Filled 2012-06-22: qty 1

## 2012-06-22 MED ORDER — EPINEPHRINE 0.3 MG/0.3ML IJ DEVI: 0.3000 mg | INTRAMUSCULAR | Status: DC | PRN

## 2012-06-22 NOTE — ED Notes (Signed)
The pt was stung  By  A bee to the rt hand 2hours ago.  She has pain and swelling in her rt hand.  She took the benadryl   50 mg one hour ago.  No rash no sob  Local reaction only at present

## 2012-06-22 NOTE — ED Provider Notes (Signed)
History     CSN: [id number]  Arrival date & time 06/22/12  1947   First MD Initiated Contact with Patient 06/22/12 2009      Chief Complaint  Patient presents with  . Insect Bite   HPI  History provided by the patient. Patient is a 56 year old female with history of hypertension, diabetes, chronic kidney disease and degenerative disc disease who presents with concerns for possible bee sting to right palm of hand. Patient states she was outside walking and brace herself against a metal bands and did have some holes in it. She suddenly felt a sharp pain in her hand and believes she may have been stung by a bee. There was no wound or bleeding to the hand. Patient did not see any stinger in the hand has had redness and swelling. Patient is not believe that this was electrically charts. She did not see a bee or any other cause of the injury is unsure if this is exactly what happened. Since that time there is steam burning pain to the hand with slight pruritus. Pain occasionally radiates to the forearm. She denies any other rash to the skin, chest tightness, shortness of breath, throat swelling or difficulty breathing. Patient does report prior history of allergy to bee stings and used to carry a EpiPen but has not had one for many years. She denies any other symptoms. Accident occurred at 6 PM 3 hours ago.    Past Medical History  Diagnosis Date  . Chronic kidney disease   . Diabetes mellitus   . Hypertension   . GERD (gastroesophageal reflux disease)   . Hypercholesterolemia   . Degenerative disc disease   . Depression     Past Surgical History  Procedure Date  . Tonsillectomy 1971  . Cholecystectomy 1976  . Hand surgery 2000    right  . Back surgery 1990,2003,2004,2010  . Cardiac surgery 2002  . Cardiac catheterization 2011    [facility].   . Gastric bypass 08/22/10    No family history on file.  History  Substance Use Topics  . Smoking status: Never Smoker   .  Smokeless tobacco: Not on file  . Alcohol Use: No    OB History    Grav Para Term Preterm Abortions TAB SAB Ect Mult Living                  Review of Systems  Constitutional: Negative for fever.  HENT: Negative for trouble swallowing and voice change.   Respiratory: Negative for shortness of breath.   Cardiovascular: Negative for chest pain.  Skin: Negative for rash.    Allergies  Loratadine and Latex  Home Medications   Current Outpatient Rx  Name Route Sig Dispense Refill  . AMITRIPTYLINE HCL 25 MG PO TABS Oral Take 25 mg by mouth at bedtime.    . ATORVASTATIN CALCIUM 10 MG PO TABS Oral Take 5 mg by mouth daily.     Marland Kitchen CALCIUM CARBONATE 1500 MG PO TABS Oral Take 2 tablets by mouth daily.      Marland Kitchen VITAMIN B-12 PO Oral Take 1 tablet by mouth every 7 (seven) days. Takes on Wed    . DESVENLAFAXINE SUCCINATE ER 100 MG PO TB24 Oral Take 100 mg by mouth daily.    . ENALAPRIL MALEATE 2.5 MG PO TABS Oral Take 2.5 mg by mouth daily.    Marland Kitchen HYDROCODONE-ACETAMINOPHEN 10-325 MG PO TABS Oral Take 1 tablet by mouth every 6 (six) hours as needed.  For back pain    . ONE-DAILY MULTI VITAMINS PO TABS Oral Take 1 tablet by mouth 2 (two) times daily.     Marland Kitchen OMEPRAZOLE 20 MG PO CPDR Oral Take 20 mg by mouth daily.      BP 114/71  Pulse 74  Temp 97.3 F (36.3 C) (Oral)  Resp 20  SpO2 99%  Physical Exam  Nursing note and vitals reviewed. Constitutional: She is oriented to person, place, and time. She appears well-developed and well-nourished. No distress.  HENT:  Head: Normocephalic.  Mouth/Throat: Oropharynx is clear and moist.  Neck: Normal range of motion. Neck supple.  Cardiovascular: Normal rate and regular rhythm.   Pulmonary/Chest: Effort normal and breath sounds normal. No stridor. No respiratory distress. She has no wheezes. She has no rales.  Abdominal: Soft.  Musculoskeletal:       There is erythema over the lower aspect of the right hand and mild swelling. Slight tenderness to  palpation. No stinger or foreign body present. No significant injury to the skin noted. Normal sensation in fingers. Normal cap refill less than 2 seconds.  Neurological: She is alert and oriented to person, place, and time.  Skin: Skin is warm and dry. No rash noted.  Psychiatric: She has a normal mood and affect. Her behavior is normal.    ED Course  Procedures     1. Bee sting       MDM  9:00PM patient seen and evaluated. Patient with localized reaction to right palm and hand. No signs of anaphylactic reaction. Patient does report the allergies in the past formerly carried EpiPen but no longer has one. History unclear if this was a bee sting or other insect.        Angus Seller, [place] 06/23/12 [phone]

## 2012-06-22 NOTE — ED Notes (Signed)
Pt states understanding of discharge instructions 

## 2012-06-22 NOTE — ED Notes (Signed)
Pt presents with swelling and redness right hand, with itching and throbbing localized to right hand. Can wiggle digits, sensation present, cap refill less than 3 seconds

## 2012-06-23 NOTE — ED Provider Notes (Signed)
Medical screening examination/treatment/procedure(s) were conducted as a shared visit with non-physician practitioner(s) and myself.  I personally evaluated the patient during the encounter  Toy Baker, MD 06/23/12 [id number]

## 2012-07-15 ENCOUNTER — Telehealth: Payer: Self-pay | Admitting: *Deleted

## 2012-07-15 NOTE — Telephone Encounter (Signed)
We can call in the rx to [facility] and she still needs to get panel in October but we can go ahead and send rx.

## 2012-07-15 NOTE — Telephone Encounter (Signed)
She is asking if we called in the Lipitor to [facility] for her. Can we call this in or does she need a lipid panel first. Her last was 07/2011 and was abnormal.

## 2012-07-16 NOTE — Telephone Encounter (Signed)
Lipitor ordered. Order # is [id number]. Pt informed.

## 2012-07-30 ENCOUNTER — Telehealth: Payer: Self-pay | Admitting: *Deleted

## 2012-07-30 ENCOUNTER — Encounter: Payer: Self-pay | Admitting: Family Medicine

## 2012-07-30 ENCOUNTER — Ambulatory Visit (INDEPENDENT_AMBULATORY_CARE_PROVIDER_SITE_OTHER): Payer: Medicare Other | Admitting: Family Medicine

## 2012-07-30 ENCOUNTER — Ambulatory Visit (HOSPITAL_BASED_OUTPATIENT_CLINIC_OR_DEPARTMENT_OTHER)
Admission: RE | Admit: 2012-07-30 | Discharge: 2012-07-30 | Disposition: A | Discharge status: Home / Self Care | Payer: Medicare Other | Source: Ambulatory Visit | Attending: Family Medicine | Admitting: Family Medicine

## 2012-07-30 VITALS — BP 115/68 | HR 69 | Ht 65.0 in | Wt 202.0 lb

## 2012-07-30 DIAGNOSIS — R51 Headache: Secondary | ICD-10-CM | POA: Insufficient documentation

## 2012-07-30 DIAGNOSIS — M26629 Arthralgia of temporomandibular joint, unspecified side: Secondary | ICD-10-CM

## 2012-07-30 DIAGNOSIS — M2669 Other specified disorders of temporomandibular joint: Secondary | ICD-10-CM

## 2012-07-30 DIAGNOSIS — R209 Unspecified disturbances of skin sensation: Secondary | ICD-10-CM | POA: Insufficient documentation

## 2012-07-30 DIAGNOSIS — I6350 Cerebral infarction due to unspecified occlusion or stenosis of unspecified cerebral artery: Secondary | ICD-10-CM | POA: Insufficient documentation

## 2012-07-30 MED ORDER — HYDROCODONE-ACETAMINOPHEN 10-325 MG PO TABS: 1.0000 | ORAL_TABLET | Freq: Four times a day (QID) | ORAL | Status: DC | PRN

## 2012-07-30 MED ORDER — MELOXICAM 15 MG PO TABS: 15.0000 mg | ORAL_TABLET | Freq: Every day | ORAL | Status: DC

## 2012-07-30 MED ORDER — KETOROLAC TROMETHAMINE 60 MG/2ML IM SOLN
60.0000 mg | Freq: Once | INTRAMUSCULAR | Status: AC
  Administered 2012-07-30: 60 mg via INTRAMUSCULAR

## 2012-07-30 MED ORDER — KETOROLAC TROMETHAMINE 60 MG/2ML IM SOLN: 60.0000 mg | Freq: Once | INTRAMUSCULAR | Status: DC

## 2012-07-30 NOTE — Telephone Encounter (Signed)
Pt calls back and states that her left ear is hurting as well. Feels like in a daze.

## 2012-07-30 NOTE — Progress Notes (Signed)
  Subjective:    Patient ID: Theresa Scott, female    DOB: Jul 25, 1956, 56 y.o.   MRN: [id number]  Headache  This is a new problem. The current episode started in the past 7 days (started on Friday). The problem occurs constantly. The problem has been unchanged. The pain is located in the left unilateral region. The pain does not radiate. The pain quality is not similar to prior headaches. The quality of the pain is described as aching and sharp. The pain is at a severity of 8/10. The pain is moderate. Associated symptoms include blurred vision, dizziness, ear pain, eye pain, nausea, neck pain, numbness and a visual change. Pertinent negatives include no abdominal pain, anorexia, back pain, coughing, drainage, eye redness, eye watering, facial sweating, fever, hearing loss, insomnia, loss of balance, phonophobia, photophobia, scalp tenderness, seizures, sinus pressure, sore throat, swollen glands, tingling, tinnitus, vomiting, weakness or weight loss. Nothing aggravates the symptoms. She has tried nothing for the symptoms. Her past medical history is significant for hypertension and obesity. There is no history of cancer, cluster headaches, immunosuppression, migraine headaches, migraines in the family, pseudotumor cerebri, recent head traumas, sinus disease or TMJ.      Review of Systems  Constitutional: Negative for fever and weight loss.  HENT: Positive for ear pain and neck pain. Negative for hearing loss, sore throat, sinus pressure and tinnitus.   Eyes: Positive for blurred vision and pain. Negative for photophobia and redness.  Respiratory: Negative for cough.   Gastrointestinal: Positive for nausea. Negative for vomiting, abdominal pain and anorexia.  Musculoskeletal: Negative for back pain.  Neurological: Positive for dizziness, numbness and headaches. Negative for tingling, seizures, weakness and loss of balance.  Psychiatric/Behavioral: The patient does not have insomnia.   All other  systems reviewed and are negative.      BP 115/68  Pulse 69  Ht 5\' 5"  (1.651 m)  Wt 202 lb (91.627 kg)  BMI 33.61 kg/m2  SpO2 97% Objective:   Physical Exam  Vitals reviewed. Constitutional: She appears well-developed and well-nourished.       Obese WF  HENT:  Head: Normocephalic and atraumatic.  Right Ear: Hearing, tympanic membrane and external ear normal.  Left Ear: Hearing, tympanic membrane and external ear normal.       TMJ pain  Eyes: Conjunctivae normal and EOM are normal. Pupils are equal, round, and reactive to light. Right eye exhibits no discharge. Left eye exhibits discharge.  Neck: Normal range of motion. Neck supple.  Cardiovascular: Normal rate, regular rhythm and normal heart sounds.   No murmur heard. Pulmonary/Chest: Effort normal and breath sounds normal. No respiratory distress. She has no wheezes.  Neurological: She is alert. She has normal reflexes.  Skin: Skin is warm and dry.       Assessment & Plan:

## 2012-07-30 NOTE — Telephone Encounter (Signed)
Pt has had a H/A since last Friday. Left sided toward the back of neck and radiates into shoulder. Some nausea but not a lot. No photophobia. Has used pain meds for her back Hydrocodone and this did not help her H/A at all. Tried ibuprofen and Tylenol ES. Please advise

## 2012-07-30 NOTE — Telephone Encounter (Signed)
Have her make appt

## 2012-07-30 NOTE — Patient Instructions (Signed)
Headaches, Frequently Asked Questions MIGRAINE HEADACHES Q: What is migraine? What causes it? How can I treat it? A: Generally, migraine headaches begin as a dull ache. Then they develop into a constant, throbbing, and pulsating pain. You may experience pain at the temples. You may experience pain at the front or back of one or both sides of the head. The pain is usually accompanied by a combination of:  Nausea.  Vomiting.  Sensitivity to light and noise. Some people (about 15%) experience an aura (see below) before an attack. The cause of migraine is believed to be chemical reactions in the brain. Treatment for migraine may include over-the-counter or prescription medications. It may also include self-help techniques. These include relaxation training and biofeedback.  Q: What is an aura? A: About 15% of people with migraine get an "aura". This is a sign of neurological symptoms that occur before a migraine headache. You may see wavy or jagged lines, dots, or flashing lights. You might experience tunnel vision or blind spots in one or both eyes. The aura can include visual or auditory hallucinations (something imagined). It may include disruptions in smell (such as strange odors), taste or touch. Other symptoms include:  Numbness.  A "pins and needles" sensation.  Difficulty in recalling or speaking the correct word. These neurological events may last as long as 60 minutes. These symptoms will fade as the headache begins. Q: What is a trigger? A: Certain physical or environmental factors can lead to or "trigger" a migraine. These include:  Foods.  Hormonal changes.  Weather.  Stress. It is important to remember that triggers are different for everyone. To help prevent migraine attacks, you need to figure out which triggers affect you. Keep a headache diary. This is a good way to track triggers. The diary will help you talk to your healthcare professional about your condition. Q: Does  weather affect migraines? A: Bright sunshine, hot, humid conditions, and drastic changes in barometric pressure may lead to, or "trigger," a migraine attack in some people. But studies have shown that weather does not act as a trigger for everyone with migraines. Q: What is the link between migraine and hormones? A: Hormones start and regulate many of your body's functions. Hormones keep your body in balance within a constantly changing environment. The levels of hormones in your body are unbalanced at times. Examples are during menstruation, pregnancy, or menopause. That can lead to a migraine attack. In fact, about three quarters of all women with migraine report that their attacks are related to the menstrual cycle.  Q: Is there an increased risk of stroke for migraine sufferers? A: The likelihood of a migraine attack causing a stroke is very remote. That is not to say that migraine sufferers cannot have a stroke associated with their migraines. In persons under age 40, the most common associated factor for stroke is migraine headache. But over the course of a person's normal life span, the occurrence of migraine headache may actually be associated with a reduced risk of dying from cerebrovascular disease due to stroke.  Q: What are acute medications for migraine? A: Acute medications are used to treat the pain of the headache after it has started. Examples over-the-counter medications, NSAIDs, ergots, and triptans.  Q: What are the triptans? A: Triptans are the newest class of abortive medications. They are specifically targeted to treat migraine. Triptans are vasoconstrictors. They moderate some chemical reactions in the brain. The triptans work on receptors in your brain. Triptans help   to restore the balance of a neurotransmitter called serotonin. Fluctuations in levels of serotonin are thought to be a main cause of migraine.  Q: Are over-the-counter medications for migraine effective? A:  Over-the-counter, or "OTC," medications may be effective in relieving mild to moderate pain and associated symptoms of migraine. But you should see your caregiver before beginning any treatment regimen for migraine.  Q: What are preventive medications for migraine? A: Preventive medications for migraine are sometimes referred to as "prophylactic" treatments. They are used to reduce the frequency, severity, and length of migraine attacks. Examples of preventive medications include antiepileptic medications, antidepressants, beta-blockers, calcium channel blockers, and NSAIDs (nonsteroidal anti-inflammatory drugs). Q: Why are anticonvulsants used to treat migraine? A: During the past few years, there has been an increased interest in antiepileptic drugs for the prevention of migraine. They are sometimes referred to as "anticonvulsants". Both epilepsy and migraine may be caused by similar reactions in the brain.  Q: Why are antidepressants used to treat migraine? A: Antidepressants are typically used to treat people with depression. They may reduce migraine frequency by regulating chemical levels, such as serotonin, in the brain.  Q: What alternative therapies are used to treat migraine? A: The term "alternative therapies" is often used to describe treatments considered outside the scope of conventional Western medicine. Examples of alternative therapy include acupuncture, acupressure, and yoga. Another common alternative treatment is herbal therapy. Some herbs are believed to relieve headache pain. Always discuss alternative therapies with your caregiver before proceeding. Some herbal products contain arsenic and other toxins. TENSION HEADACHES Q: What is a tension-type headache? What causes it? How can I treat it? A: Tension-type headaches occur randomly. They are often the result of temporary stress, anxiety, fatigue, or anger. Symptoms include soreness in your temples, a tightening band-like sensation  around your head (a "vice-like" ache). Symptoms can also include a pulling feeling, pressure sensations, and contracting head and neck muscles. The headache begins in your forehead, temples, or the back of your head and neck. Treatment for tension-type headache may include over-the-counter or prescription medications. Treatment may also include self-help techniques such as relaxation training and biofeedback. CLUSTER HEADACHES Q: What is a cluster headache? What causes it? How can I treat it? A: Cluster headache gets its name because the attacks come in groups. The pain arrives with little, if any, warning. It is usually on one side of the head. A tearing or bloodshot eye and a runny nose on the same side of the headache may also accompany the pain. Cluster headaches are believed to be caused by chemical reactions in the brain. They have been described as the most severe and intense of any headache type. Treatment for cluster headache includes prescription medication and oxygen. SINUS HEADACHES Q: What is a sinus headache? What causes it? How can I treat it? A: When a cavity in the bones of the face and skull (a sinus) becomes inflamed, the inflammation will cause localized pain. This condition is usually the result of an allergic reaction, a tumor, or an infection. If your headache is caused by a sinus blockage, such as an infection, you will probably have a fever. An x-ray will confirm a sinus blockage. Your caregiver's treatment might include antibiotics for the infection, as well as antihistamines or decongestants.  REBOUND HEADACHES Q: What is a rebound headache? What causes it? How can I treat it? A: A pattern of taking acute headache medications too often can lead to a condition known as "rebound headache."   A pattern of taking too much headache medication includes taking it more than 2 days per week or in excessive amounts. That means more than the label or a caregiver advises. With rebound  headaches, your medications not only stop relieving pain, they actually begin to cause headaches. Doctors treat rebound headache by tapering the medication that is being overused. Sometimes your caregiver will gradually substitute a different type of treatment or medication. Stopping may be a challenge. Regularly overusing a medication increases the potential for serious side effects. Consult a caregiver if you regularly use headache medications more than 2 days per week or more than the label advises. ADDITIONAL QUESTIONS AND ANSWERS Q: What is biofeedback? A: Biofeedback is a self-help treatment. Biofeedback uses special equipment to monitor your body's involuntary physical responses. Biofeedback monitors:  Breathing.  Pulse.  Heart rate.  Temperature.  Muscle tension.  Brain activity. Biofeedback helps you refine and perfect your relaxation exercises. You learn to control the physical responses that are related to stress. Once the technique has been mastered, you do not need the equipment any more. Q: Are headaches hereditary? A: Four out of five (80%) of people that suffer report a family history of migraine. Scientists are not sure if this is genetic or a family predisposition. Despite the uncertainty, a child has a 50% chance of having migraine if one parent suffers. The child has a 75% chance if both parents suffer.  Q: Can children get headaches? A: By the time they reach high school, most young people have experienced some type of headache. Many safe and effective approaches or medications can prevent a headache from occurring or stop it after it has begun.  Q: What type of doctor should I see to diagnose and treat my headache? A: Start with your primary caregiver. Discuss his or her experience and approach to headaches. Discuss methods of classification, diagnosis, and treatment. Your caregiver may decide to recommend you to a headache specialist, depending upon your symptoms or other  physical conditions. Having diabetes, allergies, etc., may require a more comprehensive and inclusive approach to your headache. The National Head[employer] Foundation will provide, upon request, a list of [facility] physician members in your state. Document Released: 12/30/2003 Document Revised: 01/01/2012 Document Reviewed: 06/08/2008 [facility] Patient Information 2013 [place], [place]. Temporomandibular Joint Pain Your exam shows that you have a problem with your temporomandibular joint (TMJ), the joint that moves when you open your mouth or chew food. TMJ problems can result from direct injuries, bite abnormalities, or tension states which cause you to grind or clench your teeth. Typical symptoms include pain around the joint, clicking, restricted movement, and headaches. The TMJ is like any other joint in the body; when it is strained, it needs rest to repair itself. To keep the joint at rest it is important that you do not open your mouth wider than the width of your index finger. If you must yawn, be sure to support your chin with your hand so your mouth does not open wide. Eat a soft diet (nothing firmer than ground beef, no raw vegetables), do not chew gum and do not talk if it causes you pain. Apply topical heat by using a warm, moist cloth placed in front of the ear for 15 to 20 minutes several times daily. Alternating heat and ice may give even more relief. Anti-inflammatory pain medicine and muscle relaxants can also be helpful. A dental orthotic or splint may be used for temporary relief. Long-term problems may require treatment  for stress as well as braces or surgery. Please check with your doctor or dentist if your symptoms do not improve within one week. Document Released: 11/16/2004 Document Revised: 01/01/2012 Document Reviewed: 10/09/2005 [facility] Patient Information 2013 [place], [place].

## 2012-07-30 NOTE — Telephone Encounter (Signed)
Pt notified and sent to schedule appt for today

## 2012-07-31 ENCOUNTER — Telehealth: Payer: Self-pay | Admitting: Family Medicine

## 2012-07-31 NOTE — Telephone Encounter (Signed)
Please fax over a copy of the MRI report to her neurologist.

## 2012-07-31 NOTE — Telephone Encounter (Signed)
Pt aware. Pt already sees Neuro for back so she will call to see them for HAs as well. Pt states HA is no better today.

## 2012-07-31 NOTE — Telephone Encounter (Signed)
Call patient: Her MRI results showed no acute stroke, bleed or mass such as brain cancer. 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 This could be from years ago. This is not currently causing her symptoms. She also has a very nonspecific changes which can sometimes be seen in people who have migraine headaches were from old infection. Is she feeling any better? If she still having a lot of pain and problems and consider referral to neurology for further evaluation.

## 2012-08-04 ENCOUNTER — Telehealth: Payer: Self-pay

## 2012-08-04 ENCOUNTER — Emergency Department
Admission: EM | Admit: 2012-08-04 | Discharge: 2012-08-04 | Disposition: A | Discharge status: Home / Self Care | Payer: Medicare Other | Source: Home / Self Care | Attending: Family Medicine | Admitting: Family Medicine

## 2012-08-04 DIAGNOSIS — R112 Nausea with vomiting, unspecified: Secondary | ICD-10-CM

## 2012-08-04 DIAGNOSIS — G4452 New daily persistent headache (NDPH): Secondary | ICD-10-CM

## 2012-08-04 LAB — C-REACTIVE PROTEIN: CRP: <0.5 mg/dL (ref <0.60)

## 2012-08-04 LAB — POCT CBC W AUTO DIFF (K'VILLE URGENT CARE)

## 2012-08-04 LAB — SEDIMENTATION RATE: Sed Rate: 1 mm/hr (ref 0–22)

## 2012-08-04 MED ORDER — SODIUM CHLORIDE 0.9 % IV BOLUS (SEPSIS)
1000.0000 mL | Freq: Once | INTRAVENOUS | Status: AC
  Administered 2012-08-04: 1000 mL via INTRAVENOUS

## 2012-08-04 MED ORDER — ONDANSETRON HCL 4 MG PO TABS
4.0000 mg | ORAL_TABLET | Freq: Once | ORAL | Status: AC
  Administered 2012-08-04: 4 mg via ORAL

## 2012-08-04 MED ORDER — KETOROLAC TROMETHAMINE 60 MG/2ML IM SOLN
60.0000 mg | Freq: Once | INTRAMUSCULAR | Status: AC
  Administered 2012-08-04: 60 mg via INTRAMUSCULAR

## 2012-08-04 MED ORDER — PREDNISONE 10 MG PO TABS: ORAL_TABLET | ORAL | Status: DC

## 2012-08-04 MED ORDER — ONDANSETRON HCL 4 MG PO TABS: 4.0000 mg | ORAL_TABLET | Freq: Four times a day (QID) | ORAL | Status: DC

## 2012-08-04 NOTE — ED Notes (Signed)
Patient advised of the normal labs and to follow up with Dr Linford Arnold this week if not improved.

## 2012-08-04 NOTE — ED Provider Notes (Signed)
History     CSN: [id number]  Arrival date & time 08/04/12  1119   First MD Initiated Contact with Patient 08/04/12 1131      Chief complaint:  Persistent headache    HPI Comments: Theresa Scott complains of headache that is a 10/10 with nausea and vomiting. She was seen by Dr Thurmond Butts on 07/30/2012 for this and given Mobic and Hydrocodone which have not been helpful.  She has tried Excedrin Migraine also without improvement.  She did have a MRI that should no evidence of acute stroke. She took Asprin and hydrocodone today without any relief. Denies fever, chills or sweats. Her left-sided headache has been constant and persistent for a week, and she has difficulty sleeping as a result.  She has had nausea for several days, and several episodes of vomiting over the past two days.  She has had vague tingling sensation in her left face, and notes a vague change in her left vision She has no significant past history of headaches, and there is no family history of migraines.  Patient is a 56 y.o. female presenting with headaches. The history is provided by the patient and the spouse.  Headache The primary symptoms include headaches, paresthesias, nausea and vomiting. Primary symptoms do not include syncope, loss of consciousness, altered mental status, dizziness, visual change, focal weakness, loss of sensation, speech change, memory loss or fever. Episode onset: 1 week ago. The symptoms are unchanged. The neurological symptoms are focal.  The headache is associated with paresthesias. The headache is not associated with photophobia, visual change, neck stiffness, weakness or loss of balance.  Additional symptoms do not include neck stiffness, weakness, lower back pain, leg pain, loss of balance, photophobia, taste disturbance, hearing loss or vertigo. Medical issues also include diabetes and hypertension. Medical issues do not include cerebral vascular accident. Workup history includes MRI.    Past Medical  History  Diagnosis Date  . Chronic kidney disease   . Diabetes mellitus   . Hypertension   . GERD (gastroesophageal reflux disease)   . Hypercholesterolemia   . Degenerative disc disease   . Depression     Past Surgical History  Procedure Date  . Tonsillectomy 1971  . Cholecystectomy 1976  . Hand surgery 2000    right  . Back surgery 1990,2003,2004,2010  . Cardiac surgery 2002  . Cardiac catheterization 2011    [facility].   . Gastric bypass 08/22/10    History reviewed. No pertinent family history.  History  Substance Use Topics  . Smoking status: Former Smoker -- 0.5 packs/day for 18 years    Quit date: 08/04/1990  . Smokeless tobacco: Never Used  . Alcohol Use: No    OB History    Grav Para Term Preterm Abortions TAB SAB Ect Mult Living                  Review of Systems  Constitutional: Negative for fever.  HENT: Negative for hearing loss and neck stiffness.   Eyes: Negative for photophobia.  Cardiovascular: Negative for syncope.  Gastrointestinal: Positive for nausea and vomiting.  Neurological: Positive for headaches and paresthesias. Negative for dizziness, vertigo, speech change, focal weakness, loss of consciousness, weakness and loss of balance.  Psychiatric/Behavioral: Negative for memory loss and altered mental status.  All other systems reviewed and are negative.    Allergies  Loratadine and Latex  Home Medications   Current Outpatient Rx  Name Route Sig Dispense Refill  . AMITRIPTYLINE HCL 25 MG PO  TABS Oral Take 25 mg by mouth at bedtime.    . ATORVASTATIN CALCIUM 10 MG PO TABS Oral Take 5 mg by mouth daily.     Marland Kitchen CALCIUM CARBONATE 1500 MG PO TABS Oral Take 2 tablets by mouth daily.      Marland Kitchen VITAMIN B-12 PO Oral Take 1 tablet by mouth every 7 (seven) days. Takes on Wed    . DESVENLAFAXINE SUCCINATE ER 100 MG PO TB24 Oral Take 100 mg by mouth daily.    . ENALAPRIL MALEATE 2.5 MG PO TABS Oral Take 2.5 mg by mouth daily.    Marland Kitchen EPINEPHRINE  0.3 MG/0.3ML IJ DEVI Intramuscular Inject 0.3 mLs (0.3 mg total) into the muscle as needed. 1 Device 0  . HYDROCODONE-ACETAMINOPHEN 10-325 MG PO TABS Oral Take 1 tablet by mouth every 6 (six) hours as needed. For back pain 30 tablet 0  . MELOXICAM 15 MG PO TABS Oral Take 1 tablet (15 mg total) by mouth daily. 30 tablet 2  . ONE-DAILY MULTI VITAMINS PO TABS Oral Take 1 tablet by mouth 2 (two) times daily.     Marland Kitchen OMEPRAZOLE 20 MG PO CPDR Oral Take 20 mg by mouth daily.      BP 143/84  Pulse 72  Temp 98 F (36.7 C) (Oral)  Resp 16  Ht 5\' 7"  (1.702 m)  Wt 205 lb (92.987 kg)  BMI 32.11 kg/m2  SpO2 95%  Physical Exam Nursing notes and Vital Signs reviewed. Appearance:  Patient appears, stated age, and in no acute distress.  She is Alert and oriented.  Patient is   obese (BMI 32.1) Head:  There is mild tenderness over the left temporal artery but not the right. Eyes:  Pupils are equal, round, and reactive to light and accomodation.  Extraocular movement is intact.  Conjunctivae are not inflamed.  Fundi benign.  No photophobia.  No nystagmus  Ears:  Canals normal.  Tympanic membranes normal.  Mild left temporomandibular joint tenderness Nose:  Normal turbinates.  No sinus tenderness.   Mouth:  No lesions Pharynx:  Normal Neck:  Supple.  No adenopathy or thyromegaly.  Carotids have normal upstrokes without bruits. Lungs:  Clear to auscultation.  Breath sounds are equal.  Heart:  Regular rate and rhythm without murmurs, rubs, or gallops.  Abdomen:  Nontender without masses or hepatosplenomegaly.  Bowel sounds are present.  No CVA or flank tenderness.  Extremities:  No edema.  No calf tenderness Skin:  No rash present.  Neurologic:  Cranial nerves 2 through 12 are normal.  Patellar, achilles, and elbow reflexes are normal.  Cerebellar function is intact (finger-to-nose and rapid alternating hand movement).  Gait and station are normal.    ED Course  Procedures none  Labs Reviewed - POCT CBC:   WBC 6.8; LY 34.7; MO 6.3; GR 59.0; Hgb 11.9; Platelets 165  Sed Rate 92mm/hr C-reactive protein < 0.5 mg/dL  Reviewed recent office notes. Reviewed results of MRI w/o contrast done 07/30/12:  IMPRESSION:  1. Scattered periventricular subcortical T2 hyperintensities are  greater than expected for age. The finding is nonspecific but can  be seen in the setting of chronic microvascular ischemia, a  demyelinating process such as multiple sclerosis, vasculitis,  complicated migraine headaches, or as the sequelae of a prior  infectious or inflammatory process.  2. Remote lacunar infarct of the left thalamus.     1. New daily persistent headache; normal sed rate and C-reactive protein rule out temporal arteritis   2. Nausea  and vomiting       MDM  IV fluids NS one liter.  Toradol 60mg  IM.  Zofran 4mg  PO.  Rx given for Zofran at home. Continue Mobic as prescribed.  May take [facility] as needed.  Rest, increase fluid intake. Followup with neurologist as soon as possible for further management.  If symptoms become significantly worse during the night or over the weekend, proceed to the local emergency room.         Lattie Haw, MD 08/04/12 [phone]

## 2012-08-04 NOTE — ED Notes (Signed)
Theresa Scott complains of headache that is a 10/10 with nausea and vomiting. She was seen by Dr Thurmond Butts on 07/30/2012 for this and given Mobic and Hydrocodone. She did have a MRI that should no evidence of stroke. She took Asprin and hydrocodone today without any relief. Denies fever, chills or sweats.

## 2012-08-05 ENCOUNTER — Telehealth: Payer: Self-pay | Admitting: *Deleted

## 2012-08-05 DIAGNOSIS — R51 Headache: Secondary | ICD-10-CM

## 2012-08-05 NOTE — Telephone Encounter (Signed)
referall entered. Call if hasn't heard anything by Thursday.,

## 2012-08-05 NOTE — Telephone Encounter (Signed)
Still has H/A. Pt does not have a neurologist it is a [facility]. So needs a referral to Neurologist for her H/A. Seen at [facility] yesterday and given Zofran for nausea, hydrocodone for the pain and a bag of fluids. Pt states H/A not as bad right now as it was yesterday. Instructed to get plenty of rest, take meds as prescribed and I would send to MD to get referral started to a neuro.

## 2012-08-06 ENCOUNTER — Ambulatory Visit: Payer: Medicare Other | Admitting: Family Medicine

## 2012-08-07 NOTE — Telephone Encounter (Signed)
Done

## 2012-09-09 ENCOUNTER — Ambulatory Visit (INDEPENDENT_AMBULATORY_CARE_PROVIDER_SITE_OTHER): Payer: Medicare Other | Admitting: Physician Assistant

## 2012-09-09 ENCOUNTER — Encounter: Payer: Self-pay | Admitting: Physician Assistant

## 2012-09-09 VITALS — BP 148/85 | HR 87 | Temp 97.9°F | Wt 202.0 lb

## 2012-09-09 DIAGNOSIS — R309 Painful micturition, unspecified: Secondary | ICD-10-CM

## 2012-09-09 DIAGNOSIS — I10 Essential (primary) hypertension: Secondary | ICD-10-CM

## 2012-09-09 DIAGNOSIS — Z23 Encounter for immunization: Secondary | ICD-10-CM

## 2012-09-09 DIAGNOSIS — N390 Urinary tract infection, site not specified: Secondary | ICD-10-CM

## 2012-09-09 DIAGNOSIS — R300 Dysuria: Secondary | ICD-10-CM

## 2012-09-09 DIAGNOSIS — Z87442 Personal history of urinary calculi: Secondary | ICD-10-CM

## 2012-09-09 LAB — POCT URINALYSIS DIPSTICK
Bilirubin, UA: NEGATIVE
Blood, UA: NEGATIVE
Glucose, UA: NEGATIVE
Ketones, UA: NEGATIVE
Nitrite, UA: NEGATIVE
Protein, UA: NEGATIVE
Spec Grav, UA: >=1.03
Urobilinogen, UA: 1
pH, UA: 5.5

## 2012-09-09 MED ORDER — CIPROFLOXACIN HCL 500 MG PO TABS: 500.0000 mg | ORAL_TABLET | Freq: Two times a day (BID) | ORAL | Status: DC

## 2012-09-09 NOTE — Progress Notes (Addendum)
Subjective:    Patient ID: Theresa Scott, female    DOB: Nov 16, 1955, 56 y.o.   MRN: [id number]  HPI Patient is a pleasant 56 year old female who presents to the clinic with her daughter or painful urination. She first noticed a strong odor to her urine about a week ago. Over the past 7 days it has progressed into very painful urination and left sided flank pain. The left-sided flank pain is a 3 at 10. Most of the pain is localized to when urinating. She has not noticed any blood in her urine. Patient does have a history of kidney stones and complicated urinary tract infection where she had to go to the hospital and received IV antibiotics. Patient has had lithotripsy done at least 2 times. She denies any fever chills. She has not had any vaginal discharge. She does report a lot of abdominal discomfort. Patient has increased her water intake. She has not taken anything else to help with discomfort. Denies any nausea/vomiting or GI problems.  Patient's blood pressure is elevated again today. She denies any chest pains, palpitations, shortness of breath. She's not had any numbness and tingling of her extremities. She has had headaches but they're on going to an neurologist is working with her to control those. She does report headaches to a much better today. Patient does take a nap fluoroscopy 2.5 mg daily for blood pressure.   Review of Systems     Objective:   Physical Exam  Constitutional: She is oriented to person, place, and time. She appears well-developed and well-nourished.  HENT:  Head: Normocephalic and atraumatic.  Right Ear: External ear normal.  Left Ear: External ear normal.  Nose: Nose normal.  Mouth/Throat: Oropharynx is clear and moist.  Eyes: Conjunctivae normal are normal.  Neck: Normal range of motion. Neck supple.  Cardiovascular: Normal rate, regular rhythm and normal heart sounds.   Pulmonary/Chest: Effort normal and breath sounds normal.       Positive for CVA  tenderness on the left side.  Abdominal: Soft. Bowel sounds are normal. She exhibits no distension and no mass. There is no tenderness. There is no guarding.  Lymphadenopathy:    She has no cervical adenopathy.  Neurological: She is alert and oriented to person, place, and time.  Skin: Skin is warm and dry.  Psychiatric: She has a normal mood and affect. Her behavior is normal.          Assessment & Plan:  UTI/dysuria- UA positive for leuks only. Sent for culture. Went ahead and treated since symptomatic and has flank pain. Sent Cipro for 7 days since gone on for 1 wk, symptomatic, and flank pain. Will call pt with culture results. Recommended Azo for 3 days for symptomatic relief. Stay hydrated. Reassured patient that usually blood is in urine when they have kidney stones. If feels like not improving or worsening in next 24-48 hours give office a call. We will consider getting a CT to look for kidney stone if pain continues and culture normal. Gave handout for UTI management.   Hypertension- Pt's BP is elevated today. It was also elevated at last visit. Both visits patient has been sick. Discussed with patient that we need to keep numbers under 140/90. Discussed low salt diet and gave handout. STart checking BP daily or every other day. Let's see what numbers are running. Pt does not want to change BP med or increase dose. She has a follow up with Dr. Linford Arnold in December. Dr. Linford Arnold will  follow up with patient.   Flu shot was given today. No complication.

## 2012-09-09 NOTE — Patient Instructions (Addendum)
Try AZO for bladder pain and irritation for 3 days. Start Cipro for 7 days.    Call office if not improving in 48 hours. Will consider scan to look for kidney stones.  Blood pressure. Low salt. Staying active. Follow up on 8th with Dr. Linford Arnold. Check BP and keep log.   Urinary Tract Infection Urinary tract infections (UTIs) can develop anywhere along your urinary tract. Your urinary tract is your body's drainage system for removing wastes and extra water. Your urinary tract includes two kidneys, two ureters, a bladder, and a urethra. Your kidneys are a pair of bean-shaped organs. Each kidney is about the size of your fist. They are located below your ribs, one on each side of your spine. CAUSES Infections are caused by microbes, which are microscopic organisms, including fungi, viruses, and bacteria. These organisms are so small that they can only be seen through a microscope. Bacteria are the microbes that most commonly cause UTIs. SYMPTOMS  Symptoms of UTIs may vary by age and gender of the patient and by the location of the infection. Symptoms in young women typically include a frequent and intense urge to urinate and a painful, burning feeling in the bladder or urethra during urination. Older women and men are more likely to be tired, shaky, and weak and have muscle aches and abdominal pain. A fever may mean the infection is in your kidneys. Other symptoms of a kidney infection include pain in your back or sides below the ribs, nausea, and vomiting. DIAGNOSIS To diagnose a UTI, your caregiver will ask you about your symptoms. Your caregiver also will ask to provide a urine sample. The urine sample will be tested for bacteria and white blood cells. White blood cells are made by your body to help fight infection. TREATMENT  Typically, UTIs can be treated with medication. Because most UTIs are caused by a bacterial infection, they usually can be treated with the use of antibiotics. The choice of  antibiotic and length of treatment depend on your symptoms and the type of bacteria causing your infection. HOME CARE INSTRUCTIONS  If you were prescribed antibiotics, take them exactly as your caregiver instructs you. Finish the medication even if you feel better after you have only taken some of the medication.  Drink enough water and fluids to keep your urine clear or pale yellow.  Avoid caffeine, tea, and carbonated beverages. They tend to irritate your bladder.  Empty your bladder often. Avoid holding urine for long periods of time.  Empty your bladder before and after sexual intercourse.  After a bowel movement, women should cleanse from front to back. Use each tissue only once. SEEK MEDICAL CARE IF:   You have back pain.  You develop a fever.  Your symptoms do not begin to resolve within 3 days. SEEK IMMEDIATE MEDICAL CARE IF:   You have severe back pain or lower abdominal pain.  You develop chills.  You have nausea or vomiting.  You have continued burning or discomfort with urination. MAKE SURE YOU:   Understand these instructions.  Will watch your condition.  Will get help right away if you are not doing well or get worse. Document Released: 07/19/2005 Document Revised: 04/09/2012 Document Reviewed: 11/17/2011 [facility] Patient Information 2013 [place], [place].   1.5 Gram Low Sodium Diet A 1.5 gram sodium diet restricts the amount of sodium in the diet to no more than 1.5 g or 1500 mg daily. The American Heart Association recommends Americans over the age of 45  to consume no more than 1500 mg of sodium each day to reduce the risk of developing high blood pressure. Research also shows that limiting sodium may reduce heart attack and stroke risk. Many foods contain sodium for flavor and sometimes as a preservative. When the amount of sodium in a diet needs to be low, it is important to know what to look for when choosing foods and drinks. The following includes some  information and guidelines to help make it easier for you to adapt to a low sodium diet. QUICK TIPS  Do not add salt to food.  Avoid convenience items and fast food.  Choose unsalted snack foods.  Buy lower sodium products, often labeled as "lower sodium" or "no salt added."  Check food labels to learn how much sodium is in 1 serving.  When eating at a restaurant, ask that your food be prepared with less salt or none, if possible. READING FOOD LABELS FOR SODIUM INFORMATION The nutrition facts label is a good place to find how much sodium is in foods. Look for products with no more than 400 mg of sodium per serving. Remember that 1.5 g = 1500 mg. The food label may also list foods as:  Sodium-free: Less than 5 mg in a serving.  Very low sodium: 35 mg or less in a serving.  Low-sodium: 140 mg or less in a serving.  Light in sodium: 50% less sodium in a serving. For example, if a food that usually has 300 mg of sodium is changed to become light in sodium, it will have 150 mg of sodium.  Reduced sodium: 25% less sodium in a serving. For example, if a food that usually has 400 mg of sodium is changed to reduced sodium, it will have 300 mg of sodium. CHOOSING FOODS Grains  Avoid: Salted crackers and snack items. Some cereals, including instant hot cereals. Bread stuffing and biscuit mixes. Seasoned rice or pasta mixes.  Choose: Unsalted snack items. Low-sodium cereals, oats, puffed wheat and rice, shredded wheat. English muffins and bread. Pasta. Meats  Avoid: Salted, canned, smoked, spiced, pickled meats, including fish and poultry. Bacon, ham, sausage, cold cuts, hot dogs, anchovies.  Choose: Low-sodium canned tuna and salmon. Fresh or frozen meat, poultry, and fish. Dairy  Avoid: Processed cheese and spreads. Cottage cheese. Buttermilk and condensed milk. Regular cheese.  Choose: Milk. Low-sodium cottage cheese. Yogurt. Sour cream. Low-sodium cheese. Fruits and  Vegetables  Avoid: Regular canned vegetables. Regular canned tomato sauce and paste. Frozen vegetables in sauces. Olives. Rosita Fire. Relishes. Sauerkraut.  Choose: Low-sodium canned vegetables. Low-sodium tomato sauce and paste. Frozen or fresh vegetables. Fresh and frozen fruit. Condiments  Avoid: Canned and packaged gravies. Worcestershire sauce. Tartar sauce. Barbecue sauce. Soy sauce. Steak sauce. Ketchup. Onion, garlic, and table salt. Meat flavorings and tenderizers.  Choose: Fresh and dried herbs and spices. Low-sodium varieties of mustard and ketchup. Lemon juice. Tabasco sauce. Horseradish. SAMPLE 1.5 GRAM SODIUM MEAL PLAN Breakfast / Sodium (mg)  1 cup low-fat milk / 143 mg  1 whole-wheat English muffin / 240 mg  1 tbs heart-healthy margarine / 153 mg  1 hard-boiled egg / 139 mg  1 small orange / 0 mg Lunch / Sodium (mg)  1 cup raw carrots / 76 mg  2 tbs no salt added peanut butter / 5 mg  2 slices whole-wheat bread / 270 mg  1 tbs jelly / 6 mg   cup red grapes / 2 mg Dinner / Sodium (mg)  1  cup whole-wheat pasta / 2 mg  1 cup low-sodium tomato sauce / 73 mg  3 oz lean ground beef / 57 mg  1 small side salad (1 cup raw spinach leaves,  cup cucumber,  cup yellow bell pepper) with 1 tsp olive oil and 1 tsp red wine vinegar / 25 mg Snack / Sodium (mg)  1 container low-fat vanilla yogurt / 107 mg  3 graham cracker squares / 127 mg Nutrient Analysis  Calories: 1745  Protein: 75 g  Carbohydrate: 237 g  Fat: 57 g  Sodium: 1425 mg Document Released: 10/09/2005 Document Revised: 01/01/2012 Document Reviewed: 01/10/2010 [facility] Patient Information 2013 [place], [place].

## 2012-09-12 LAB — URINE CULTURE: Colony Count: >=100000

## 2012-09-23 ENCOUNTER — Encounter: Payer: Self-pay | Admitting: Family Medicine

## 2012-09-23 ENCOUNTER — Ambulatory Visit (INDEPENDENT_AMBULATORY_CARE_PROVIDER_SITE_OTHER): Payer: Medicare Other | Admitting: Family Medicine

## 2012-09-23 VITALS — BP 140/79 | HR 75 | Ht 67.0 in | Wt 204.0 lb

## 2012-09-23 DIAGNOSIS — R030 Elevated blood-pressure reading, without diagnosis of hypertension: Secondary | ICD-10-CM

## 2012-09-23 DIAGNOSIS — R51 Headache: Secondary | ICD-10-CM

## 2012-09-23 DIAGNOSIS — R3989 Other symptoms and signs involving the genitourinary system: Secondary | ICD-10-CM

## 2012-09-23 DIAGNOSIS — E119 Type 2 diabetes mellitus without complications: Secondary | ICD-10-CM

## 2012-09-23 DIAGNOSIS — R7301 Impaired fasting glucose: Secondary | ICD-10-CM

## 2012-09-23 DIAGNOSIS — R399 Unspecified symptoms and signs involving the genitourinary system: Secondary | ICD-10-CM

## 2012-09-23 LAB — POCT URINALYSIS DIPSTICK
Bilirubin, UA: NEGATIVE
Blood, UA: NEGATIVE
Glucose, UA: NEGATIVE
Ketones, UA: NEGATIVE
Leukocytes, UA: NEGATIVE
Nitrite, UA: NEGATIVE
Protein, UA: NEGATIVE
Spec Grav, UA: 1.02
Urobilinogen, UA: 0.2
pH, UA: 6.5

## 2012-09-23 LAB — POCT GLYCOSYLATED HEMOGLOBIN (HGB A1C)
Hemoglobin A1C: 5.7
Hemoglobin A1C: 7.8

## 2012-09-23 NOTE — Progress Notes (Signed)
  Subjective:    Patient ID: Theresa Scott, female    DOB: 01/22/1956, 56 y.o.   MRN: [id number]  HPI Had UTI about 2 weeks ago and if feeling better but still having some urgency. No hematuria or fever. Completed 5 day course of Cipro.    IFG - Want to recheck A1C to make sure odoing well off medication.  Elevated BP- Home BPs 124/60, has been tracking it bc last couple of times here BP was high. She does feel more swollen than ysual.    HA are better. Has been following with neurology. Still have not found a cause but up to 75mg  on the amitryptiline. Says the stressors in her life have been getting bettter so that has helped.     Review of Systems     Objective:   Physical Exam  Constitutional: She is oriented to person, place, and time. She appears well-developed and well-nourished.  HENT:  Head: Normocephalic and atraumatic.  Cardiovascular: Normal rate, regular rhythm and normal heart sounds.   Pulmonary/Chest: Effort normal and breath sounds normal.  Neurological: She is alert and oriented to person, place, and time.  Skin: Skin is warm and dry.  Psychiatric: She has a normal mood and affect. Her behavior is normal.          Assessment & Plan:  UTI - will recheck UA today.  Will send for culture.  UA is neg.    IFG-A1c is 5.7 today, which is fantastic. Recheck in in 6 mo.   Elevated BP- Home BP are good. Asked her to bring in her BP cuff to compare.  Make sure eating low salt diet.   HA - Improving.  I think most likely stress related. She says she may try to wean her amitryptiline back down on her own.

## 2012-09-25 LAB — URINE CULTURE
Colony Count: NO GROWTH
Organism ID, Bacteria: NO GROWTH

## 2012-09-30 ENCOUNTER — Ambulatory Visit (INDEPENDENT_AMBULATORY_CARE_PROVIDER_SITE_OTHER): Payer: Medicare Other | Admitting: Family Medicine

## 2012-09-30 ENCOUNTER — Telehealth: Payer: Self-pay

## 2012-09-30 ENCOUNTER — Telehealth: Payer: Self-pay | Admitting: *Deleted

## 2012-09-30 VITALS — BP 136/83 | HR 75

## 2012-09-30 DIAGNOSIS — I10 Essential (primary) hypertension: Secondary | ICD-10-CM

## 2012-09-30 NOTE — Telephone Encounter (Signed)
Ordered patient's Lipitor and Pristiq from [facility].  Patient's ID # [id number]  Order # [id number]

## 2012-09-30 NOTE — Telephone Encounter (Signed)
Pt.notified

## 2012-09-30 NOTE — Progress Notes (Signed)
Sue Lush, Will you let Mrs. Ringel know that since our BP cuff did not show a systolic BP greater than 140 I don't think she needs to change her antihypertensive medication regimen.  If she notices persistent numbers above 140/90 with her own cuff please let Dr. Judie Petit know.  Otherwise f/u with Dr. Judie Petit in 3 months.

## 2012-10-21 ENCOUNTER — Other Ambulatory Visit: Payer: Self-pay | Admitting: Physical Medicine and Rehabilitation

## 2012-10-29 ENCOUNTER — Other Ambulatory Visit: Payer: Self-pay | Admitting: Physical Medicine and Rehabilitation

## 2012-10-29 ENCOUNTER — Telehealth: Payer: Self-pay | Admitting: *Deleted

## 2012-10-29 NOTE — Telephone Encounter (Signed)
Am okay with taking over her gabapentin prescription. Just let us know the dose and quantity and when she needs her next refill.

## 2012-10-29 NOTE — Telephone Encounter (Signed)
Pt calls and wants to know if you will start prescribing her gabapentin. States normally gets from Dr. Noel Gerold her back doctor and if needs to continue to get from him she can just thought she would ask

## 2012-10-30 MED ORDER — GABAPENTIN 300 MG PO CAPS: 300.0000 mg | ORAL_CAPSULE | Freq: Three times a day (TID) | ORAL | Status: DC

## 2012-10-30 NOTE — Telephone Encounter (Signed)
Its Gabapentin 300mg  take three times a day and gets ninety day supply. Needs refill now. Send to [facility] on [address]

## 2012-10-30 NOTE — Addendum Note (Signed)
Addended by: Nani Gasser D on: 10/30/2012 09:21 AM   Modules accepted: Orders

## 2012-10-30 NOTE — Addendum Note (Signed)
Addended by: Barry Dienes A on: 10/30/2012 09:44 AM   Modules accepted: Orders

## 2012-11-05 ENCOUNTER — Other Ambulatory Visit: Payer: Self-pay | Admitting: *Deleted

## 2012-11-05 MED ORDER — OMEPRAZOLE 20 MG PO CPDR: 20.0000 mg | DELAYED_RELEASE_CAPSULE | Freq: Every day | ORAL | Status: DC

## 2012-11-27 ENCOUNTER — Ambulatory Visit (INDEPENDENT_AMBULATORY_CARE_PROVIDER_SITE_OTHER): Payer: Medicare Other | Admitting: Surgery

## 2012-11-28 ENCOUNTER — Telehealth: Payer: Self-pay | Admitting: *Deleted

## 2012-11-28 NOTE — Telephone Encounter (Signed)
Called to inform pt that paperwork has been completed and mailed.Theresa Scott

## 2012-12-02 ENCOUNTER — Ambulatory Visit (INDEPENDENT_AMBULATORY_CARE_PROVIDER_SITE_OTHER): Payer: Medicare Other | Admitting: Family Medicine

## 2012-12-02 ENCOUNTER — Encounter: Payer: Self-pay | Admitting: Family Medicine

## 2012-12-02 VITALS — BP 136/77 | HR 73 | Wt 198.0 lb

## 2012-12-02 DIAGNOSIS — E785 Hyperlipidemia, unspecified: Secondary | ICD-10-CM

## 2012-12-02 DIAGNOSIS — N390 Urinary tract infection, site not specified: Secondary | ICD-10-CM

## 2012-12-02 DIAGNOSIS — M542 Cervicalgia: Secondary | ICD-10-CM

## 2012-12-02 DIAGNOSIS — R51 Headache: Secondary | ICD-10-CM

## 2012-12-02 LAB — COMPLETE METABOLIC PANEL WITH GFR
ALT: 35 U/L (ref 0–35)
AST: 50 U/L — ABNORMAL HIGH (ref 0–37)
Albumin: 4.3 g/dL (ref 3.5–5.2)
Alkaline Phosphatase: 118 U/L — ABNORMAL HIGH (ref 39–117)
BUN: 12 mg/dL (ref 6–23)
CO2: 29 mEq/L (ref 19–32)
Calcium: 9.5 mg/dL (ref 8.4–10.5)
Chloride: 104 mEq/L (ref 96–112)
Creat: 0.75 mg/dL (ref 0.50–1.10)
GFR, Est African American: >89 mL/min
GFR, Est Non African American: 89 mL/min
Glucose, Bld: 91 mg/dL (ref 70–99)
Potassium: 4.4 mEq/L (ref 3.5–5.3)
Sodium: 142 mEq/L (ref 135–145)
Total Bilirubin: 0.5 mg/dL (ref 0.3–1.2)
Total Protein: 6.8 g/dL (ref 6.0–8.3)

## 2012-12-02 LAB — LIPID PANEL
Cholesterol: 101 mg/dL (ref 0–200)
HDL: 32 mg/dL — ABNORMAL LOW (ref >39)
LDL Cholesterol: 54 mg/dL (ref 0–99)
Total CHOL/HDL Ratio: 3.2 Ratio
Triglycerides: 75 mg/dL (ref <150)
VLDL: 15 mg/dL (ref 0–40)

## 2012-12-02 LAB — TSH: TSH: 1.243 u[IU]/mL (ref 0.350–4.500)

## 2012-12-02 LAB — CBC WITH DIFFERENTIAL/PLATELET
Basophils Absolute: 0 10*3/uL (ref 0.0–0.1)
Basophils Relative: 0 % (ref 0–1)
Eosinophils Absolute: 0.1 10*3/uL (ref 0.0–0.7)
Eosinophils Relative: 1 % (ref 0–5)
HCT: 40.1 % (ref 36.0–46.0)
Hemoglobin: 13.3 g/dL (ref 12.0–15.0)
Lymphocytes Relative: 26 % (ref 12–46)
Lymphs Abs: 1.9 10*3/uL (ref 0.7–4.0)
MCH: 28.7 pg (ref 26.0–34.0)
MCHC: 33.2 g/dL (ref 30.0–36.0)
MCV: 86.4 fL (ref 78.0–100.0)
Monocytes Absolute: 0.6 10*3/uL (ref 0.1–1.0)
Monocytes Relative: 9 % (ref 3–12)
Neutro Abs: 4.6 10*3/uL (ref 1.7–7.7)
Neutrophils Relative %: 64 % (ref 43–77)
Platelets: 204 10*3/uL (ref 150–400)
RBC: 4.64 MIL/uL (ref 3.87–5.11)
RDW: 13.7 % (ref 11.5–15.5)
WBC: 7.2 10*3/uL (ref 4.0–10.5)

## 2012-12-02 MED ORDER — ENALAPRIL MALEATE 2.5 MG PO TABS: 2.5000 mg | ORAL_TABLET | Freq: Every day | ORAL | Status: DC

## 2012-12-02 NOTE — Patient Instructions (Addendum)
Please see handout for cervical strain.  If you are not feeling better by Wednesday with your cold then please call the office and let me know and I will call in an antibiotic for you.   Upper Respiratory Infection, Adult An upper respiratory infection (URI) is also sometimes known as the common cold. The upper respiratory tract includes the nose, sinuses, throat, trachea, and bronchi. Bronchi are the airways leading to the lungs. Most people improve within 1 week, but symptoms can last up to 2 weeks. A residual cough may last even longer.  CAUSES Many different viruses can infect the tissues lining the upper respiratory tract. The tissues become irritated and inflamed and often become very moist. Mucus production is also common. A cold is contagious. You can easily spread the virus to others by oral contact. This includes kissing, sharing a glass, coughing, or sneezing. Touching your mouth or nose and then touching a surface, which is then touched by another person, can also spread the virus. SYMPTOMS  Symptoms typically develop 1 to 3 days after you come in contact with a cold virus. Symptoms vary from person to person. They may include:  Runny nose.  Sneezing.  Nasal congestion.  Sinus irritation.  Sore throat.  Loss of voice (laryngitis).  Cough.  Fatigue.  Muscle aches.  Loss of appetite.  Headache.  Low-grade fever. DIAGNOSIS  You might diagnose your own cold based on familiar symptoms, since most people get a cold 2 to 3 times a year. Your caregiver can confirm this based on your exam. Most importantly, your caregiver can check that your symptoms are not due to another disease such as strep throat, sinusitis, pneumonia, asthma, or epiglottitis. Blood tests, throat tests, and X-rays are not necessary to diagnose a common cold, but they may sometimes be helpful in excluding other more serious diseases. Your caregiver will decide if any further tests are required. RISKS AND  COMPLICATIONS  You may be at risk for a more severe case of the common cold if you smoke cigarettes, have chronic heart disease (such as heart failure) or lung disease (such as asthma), or if you have a weakened immune system. The very young and very old are also at risk for more serious infections. Bacterial sinusitis, middle ear infections, and bacterial pneumonia can complicate the common cold. The common cold can worsen asthma and chronic obstructive pulmonary disease (COPD). Sometimes, these complications can require emergency medical care and may be life-threatening. PREVENTION  The best way to protect against getting a cold is to practice good hygiene. Avoid oral or hand contact with people with cold symptoms. Wash your hands often if contact occurs. There is no clear evidence that vitamin C, vitamin E, echinacea, or exercise reduces the chance of developing a cold. However, it is always recommended to get plenty of rest and practice good nutrition. TREATMENT  Treatment is directed at relieving symptoms. There is no cure. Antibiotics are not effective, because the infection is caused by a virus, not by bacteria. Treatment may include:  Increased fluid intake. Sports drinks offer valuable electrolytes, sugars, and fluids.  Breathing heated mist or steam (vaporizer or shower).  Eating chicken soup or other clear broths, and maintaining good nutrition.  Getting plenty of rest.  Using gargles or lozenges for comfort.  Controlling fevers with ibuprofen or acetaminophen as directed by your caregiver.  Increasing usage of your inhaler if you have asthma. Zinc gel and zinc lozenges, taken in the first 24 hours of the  common cold, can shorten the duration and lessen the severity of symptoms. Pain medicines may help with fever, muscle aches, and throat pain. A variety of non-prescription medicines are available to treat congestion and runny nose. Your caregiver can make recommendations and may  suggest nasal or lung inhalers for other symptoms.  HOME CARE INSTRUCTIONS   Only take over-the-counter or prescription medicines for pain, discomfort, or fever as directed by your caregiver.  Use a warm mist humidifier or inhale steam from a shower to increase air moisture. This may keep secretions moist and make it easier to breathe.  Drink enough water and fluids to keep your urine clear or pale yellow.  Rest as needed.  Return to work when your temperature has returned to normal or as your caregiver advises. You may need to stay home longer to avoid infecting others. You can also use a face mask and careful hand washing to prevent spread of the virus. SEEK MEDICAL CARE IF:   After the first few days, you feel you are getting worse rather than better.  You need your caregiver's advice about medicines to control symptoms.  You develop chills, worsening shortness of breath, or brown or red sputum. These may be signs of pneumonia.  You develop yellow or brown nasal discharge or pain in the face, especially when you bend forward. These may be signs of sinusitis.  You develop a fever, swollen neck glands, pain with swallowing, or white areas in the back of your throat. These may be signs of strep throat. SEEK IMMEDIATE MEDICAL CARE IF:   You have a fever.  You develop severe or persistent headache, ear pain, sinus pain, or chest pain.  You develop wheezing, a prolonged cough, cough up blood, or have a change in your usual mucus (if you have chronic lung disease).  You develop sore muscles or a stiff neck. Document Released: 04/04/2001 Document Revised: 01/01/2012 Document Reviewed: 02/10/2011 [facility] Patient Information 2013 [place], [place].

## 2012-12-02 NOTE — Progress Notes (Signed)
  Subjective:    Patient ID: Antony Haste, female    DOB: 10/27/1955, 57 y.o.   MRN: [id number]  HPI HA - Following with neurologist for this but had to cancel her appt bc of the weather. She is called to reschedule it but has not heard back from her office yet. She was doing better in December but then has been worse th elast few weeks.  She has backed off her medications to see how she was doing but has increased them back up. She thinks maybe decreasing her medications is actually cause the headaches do become worse recently. She is sleeping better.  She has a known disc issue and her neurologist felt like some of her headaches could be coming from this.  They recommend PT but she can't afford to go at this time. She also denies any significant neck pain or discomfort. She has had a prior history of surgery. Does follow with a spine surgeon.  HA on the left side of her head.  Normal Head CT except for some chronic vascular changes.  Hyperlipidemia-  Pt denies chest pain, SOB, dizziness, or heart palpitations.  Taking meds as directed w/o problems.  Denies medication side effects.     Review of Systems     Objective:   Physical Exam  Constitutional: She is oriented to person, place, and time. She appears well-developed and well-nourished.  HENT:  Head: Normocephalic and atraumatic.  Cardiovascular: Normal rate, regular rhythm and normal heart sounds.   Pulmonary/Chest: Effort normal and breath sounds normal.  Neurological: She is alert and oriented to person, place, and time.  Skin: Skin is warm and dry.  Psychiatric: She has a normal mood and affect. Her behavior is normal.          Assessment & Plan:  HA -not well controlled. She has gotten back on her original regimen about 2 weeks ago. Certainly this may take time for this to fully kick in and provide relief for her chronic headaches.  Still on 75mg  amitryptiline.  On gabapentin as well. Forget to take TID so taking BID.  Due for  CMP and Lipoids as well. I did warn the higher dose amitriptyline can stimulate appetite. She says that is partially why she cut back on her medications. 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 Certainly she could consider going back on her amitriptyline to 25 or 50 mg if she feels that that continues to be a problem.  Cervical neck pain -will given h.o. on exercises since can't afford PT.    Hyperlipidemia-- well contorlled. RF sent. Due for CMP and Lipids as well.   DM- f/U in 6 weeks for next A1c. Typically she is extremely well controlled. The she does have a history of diabetic peripheral neuropathy.Marland Kitchen

## 2012-12-03 ENCOUNTER — Telehealth: Payer: Self-pay | Admitting: *Deleted

## 2012-12-03 MED ORDER — AMOXICILLIN-POT CLAVULANATE 875-125 MG PO TABS: 1.0000 | ORAL_TABLET | Freq: Two times a day (BID) | ORAL | Status: DC

## 2012-12-03 NOTE — Telephone Encounter (Signed)
Pt calls & states you diagnosed her yesterday with URI & for her to call back by Wed if she isn't feeling better.  She says that she isn't feeling better yet & doesn't want to wait til tomorrow because of the Batz & wants to know if you can go ahead & call her in a rx for an antibiotic.  Please advise

## 2012-12-03 NOTE — Telephone Encounter (Signed)
Ok rx sent.

## 2012-12-09 ENCOUNTER — Other Ambulatory Visit: Payer: Self-pay | Admitting: Family Medicine

## 2012-12-09 DIAGNOSIS — R748 Abnormal levels of other serum enzymes: Secondary | ICD-10-CM

## 2012-12-11 LAB — COMPLETE METABOLIC PANEL WITH GFR
ALT: 34 U/L (ref 0–35)
AST: 46 U/L — ABNORMAL HIGH (ref 0–37)
Albumin: 3.9 g/dL (ref 3.5–5.2)
Alkaline Phosphatase: 100 U/L (ref 39–117)
BUN: 15 mg/dL (ref 6–23)
CO2: 27 mEq/L (ref 19–32)
Calcium: 8.9 mg/dL (ref 8.4–10.5)
Chloride: 108 mEq/L (ref 96–112)
Creat: 0.62 mg/dL (ref 0.50–1.10)
GFR, Est African American: >89 mL/min
GFR, Est Non African American: >89 mL/min
Glucose, Bld: 88 mg/dL (ref 70–99)
Potassium: 4.1 mEq/L (ref 3.5–5.3)
Sodium: 143 mEq/L (ref 135–145)
Total Bilirubin: 0.4 mg/dL (ref 0.3–1.2)
Total Protein: 6.3 g/dL (ref 6.0–8.3)

## 2012-12-13 ENCOUNTER — Telehealth: Payer: Self-pay | Admitting: *Deleted

## 2012-12-13 NOTE — Telephone Encounter (Signed)
Pt called and informed that her Rx came in and is up front ready for p/u .[name] -

## 2013-01-09 ENCOUNTER — Encounter (INDEPENDENT_AMBULATORY_CARE_PROVIDER_SITE_OTHER): Payer: Self-pay | Admitting: Surgery

## 2013-01-09 ENCOUNTER — Ambulatory Visit (INDEPENDENT_AMBULATORY_CARE_PROVIDER_SITE_OTHER): Payer: Medicare Other | Admitting: Surgery

## 2013-01-09 VITALS — BP 124/82 | HR 78 | Temp 98.6°F | Resp 14 | Ht 67.0 in | Wt 200.0 lb

## 2013-01-09 DIAGNOSIS — Z9884 Bariatric surgery status: Secondary | ICD-10-CM

## 2013-01-09 NOTE — Progress Notes (Addendum)
[facility]  Lesia Monica, MD,  FACS [address]  [address] [place], [place]    [address] Phone:  [phone] FAX:  [phone]   Re:   Theresa Scott DOB:   11/18/1955 MRN:   [id number]  ASSESSMENT AND PLAN: 1.  RYGB - 08/22/2010.  Initial weight - 267, BMI 44.5.  She is doing very well, though she has had a little weight gain.  Because of family issues, she is not exercising as much as she was.  Will see me back in one year, depending on the upper endo. [She did not have a formal appt, but was with her husband.  Her abdominal pain is better since stopping the meloxicam.  I gave her a copy of her labs.  Thiamine was no drawn.  To cut back on B12.   Dr. [name] had ordered an US - 01/14/2013 - which showed liver steatosis.  I plan to do an upper endo on 01/27/2013.  1a.  Epigastric pain - possibly secondary to NSAID use  Will plan upper endo to evaluate for potential marginal ulcer.  She'll stop the Meloxicam in the meantime.  2.  GERD. 3.  Hypertension. Off meds. 4.  Hypercholesterolemia.  Off meds. 5.  Diabetes mellitus.  Off meds. 6.  Degenerative back disease.  On hydrocodone daily or every other day.  Dr. Cohen takes care of her.  She's had three back operations, last in 2010. 7.  History of depression - on meds 8.  History of kidney stones. 9.  Left sided HA - etiology unclear.  On NSAID for this.  She said that the headache has been blamed on cervical degenerative disease - but she has not neck pain. 10.  Pannus with redundant skin.  HISTORY OF PRESENT ILLNESS: Chief Complaint  Patient presents with  . Bariatric Follow Up   Theresa Scott is a 56 y.o. (DOB: 12/18/1955)  white female who is a patient of [name],CATHERINE, MD (she did see Dr. Karen Bowen, but Dr. Bowen is now doing bariatrics with ??) and comes to me today for RYGB.  She has some complaints of epigastric pain and vomits just about every other day.  This has been going on for 2  or 3 months..  She was placed on Meloxicam about 6 months ago for left sided headaches.   She had CT scan and MRI of her head in November 2013, which was negative.  She sees Crystal Rose, PA, in [place], who placed her on the Meloxicam.   She talked a little about her weight gain.  She and her family have had a lot going on and she has not been exercising as well.  I think one part of her weight increase is that she is exercising less than before.  About 1 years ago, she ran in the Women[place] 5[place].  She also asked about the redundant skin around her abdomen (pannus).  She's had some constipation, but takes Trail mix to help with this.  Dr. [name] has also checked some recent labs because she has an increased LFT's.  I suggested that she also get B12, Thiamine, and Folate checked.  Social History: Her husband is with her. I see her husband, Franklin Morandi ([phone]). Son,James, is with them.  PHYSICAL EXAM: BP 124/82  Pulse 78  Temp(Src) 98.6 F (37 C) (Temporal)  Resp 14  Ht 5' 7" (1.702 m)  Wt 200 lb (90.719 kg)  BMI 31.32 kg/m2  General: WN WF   who is alert and generally healthy appearing.  HEENT: Normal. Pupils equal. Good dentition. Neck: Supple. No mass.  No thyroid mass.  Carotid pulse okay with no bruit. Lymph Nodes:  No supraclavicular or cervical nodes. Lungs: Clear to auscultation and symmetric breath sounds. Heart:  RRR. No murmur or rub.  Abdomen: Soft. No mass. No hernia. Normal bowel sounds.  RUQ scar.  Laparoscopic scars.  She complains of epigastric soreness, but I don't feel anything. Extremities:  Good strength and ROM  in upper and lower extremities. Neurologic:  Grossly intact to motor and sensory function. Psychiatric: Has normal mood and affect. Behavior is normal.   DATA REVIEWED: Epic notes.  Winna Golla, MD, FACS Office:  [phone]  

## 2013-01-13 ENCOUNTER — Ambulatory Visit (INDEPENDENT_AMBULATORY_CARE_PROVIDER_SITE_OTHER): Payer: Medicare Other | Admitting: Family Medicine

## 2013-01-13 ENCOUNTER — Encounter: Payer: Self-pay | Admitting: Family Medicine

## 2013-01-13 VITALS — BP 121/66 | HR 77 | Temp 97.9°F | Wt 198.0 lb

## 2013-01-13 DIAGNOSIS — J019 Acute sinusitis, unspecified: Secondary | ICD-10-CM

## 2013-01-13 DIAGNOSIS — R748 Abnormal levels of other serum enzymes: Secondary | ICD-10-CM

## 2013-01-13 DIAGNOSIS — R51 Headache: Secondary | ICD-10-CM

## 2013-01-13 DIAGNOSIS — R7301 Impaired fasting glucose: Secondary | ICD-10-CM

## 2013-01-13 DIAGNOSIS — Z9884 Bariatric surgery status: Secondary | ICD-10-CM

## 2013-01-13 DIAGNOSIS — K219 Gastro-esophageal reflux disease without esophagitis: Secondary | ICD-10-CM

## 2013-01-13 LAB — POCT GLYCOSYLATED HEMOGLOBIN (HGB A1C): Hemoglobin A1C: 5.8

## 2013-01-13 MED ORDER — AMITRIPTYLINE HCL 25 MG PO TABS: ORAL_TABLET | ORAL | Status: DC

## 2013-01-13 MED ORDER — AMOXICILLIN-POT CLAVULANATE 875-125 MG PO TABS: 1.0000 | ORAL_TABLET | Freq: Two times a day (BID) | ORAL | Status: DC

## 2013-01-13 NOTE — Patient Instructions (Signed)

## 2013-01-13 NOTE — Progress Notes (Signed)
Subjective:    Patient ID: Theresa Scott, female    DOB: 07-14-1956, 57 y.o.   MRN: [id number]  HPI Saw Dr. Ezzard Standing last Thursday.  She has been throwing up.  Stopped her meloxicam. Not sure if caused an ulcer.  They are going to do an endoscopy on April 7th.  He would like a b12, thiamine, and folate levels.   Still having daily chronic HA.  Did see her Neurologist who thinks this is coming from her neck. They are not calling them migraines.  Did see her orhto and they did see some bulging disc but nothing that requires surgical correction. Has started some exercise. Says starting not feeling well when the amitryptiline was increased for her migraines.  HA have some improived some, SHe is able to drive now.  They're not quite as debilitating. And they are little less frequent. She just felt frustrated when she went back because they didn't change or adjust her regimen even if she still having frequent headaches.  URI - Sick x 1.5 weks. Cough and hoarsenesss. Some nasal congestion. Green/yellow sputum.  No fever or chilss. Mild ST.  Using OTC cough syrup.  Using Alkeseltzer cold.   IFG - no hypoglycemic events. No wounds or sores that arent healing well. Taking meds as rx.    Review of Systems     Objective:   Physical Exam  Constitutional: She is oriented to person, place, and time. She appears well-developed and well-nourished.  HENT:  Head: Normocephalic and atraumatic.  Right Ear: External ear normal.  Left Ear: External ear normal.  Nose: Nose normal.  Mouth/Throat: Oropharyngeal exudate present.  Left ear canal is blocked by cerumen. Right TM and canal are clear. Visible green sputum on the posterior pharynx.  Eyes: Conjunctivae and EOM are normal. Pupils are equal, round, and reactive to light.  Neck: Neck supple. No thyromegaly present.  Cardiovascular: Normal rate, regular rhythm and normal heart sounds.   Pulmonary/Chest: Effort normal and breath sounds normal. She has no  wheezes.  Lymphadenopathy:    She has no cervical adenopathy.  Neurological: She is alert and oriented to person, place, and time.  Skin: Skin is warm and dry.  Psychiatric: She has a normal mood and affect.          Assessment & Plan:  Gastric Bypass - Will check for B12, thiamine, and folate.  Marland Kitchen    HA  - Will wean her amitryptiline since she feels it is not helping and could be making her worse. I would like to see if she improves over the next month.  Sinusitis - Will tx with Augmentin.  Call if not better in a week. Handout given. Continue since Medicare.  Elevated liver enzymes. Due to recheck levels. They were initially elevated in February and on repeat they came down. Interestingly she does have a very strong family history of liver problems. Her mother had liver disease, her sister was recently diagnosed with cirrhosis which is thought to be secondary to medications. And her brother was recently diagnosed with a tumor in his liver. Not sure if benign or cancerous. I explained her that if her levels are still borderline but I would like to go ahead and move forward with an ultrasound for further evaluation of her liver.  IFG -- well controlled.  Up to date on preventative care. F/u in 4 month.  Lab Results  Component Value Date   HGBA1C 5.8 01/13/2013   GERD - she has endoscopy  scheduled in a couple weeks. In the meantime encouraged her to increase her omeprazole to twice a day for better control. Definitely stay away from any NSAIDs. I will also add this to her intolerance list.

## 2013-01-14 ENCOUNTER — Other Ambulatory Visit: Payer: Self-pay | Admitting: Family Medicine

## 2013-01-14 DIAGNOSIS — R748 Abnormal levels of other serum enzymes: Secondary | ICD-10-CM

## 2013-01-14 LAB — VITAMIN B12: Vitamin B-12: 1316 pg/mL — ABNORMAL HIGH (ref 211–911)

## 2013-01-14 LAB — HEPATIC FUNCTION PANEL
ALT: 33 U/L (ref 0–35)
AST: 45 U/L — ABNORMAL HIGH (ref 0–37)
Albumin: 4.3 g/dL (ref 3.5–5.2)
Alkaline Phosphatase: 110 U/L (ref 39–117)
Bilirubin, Direct: 0.2 mg/dL (ref 0.0–0.3)
Indirect Bilirubin: 0.3 mg/dL (ref 0.0–0.9)
Total Bilirubin: 0.5 mg/dL (ref 0.3–1.2)
Total Protein: 6.6 g/dL (ref 6.0–8.3)

## 2013-01-14 LAB — FOLATE: Folate: >20 ng/mL

## 2013-01-15 ENCOUNTER — Ambulatory Visit (HOSPITAL_BASED_OUTPATIENT_CLINIC_OR_DEPARTMENT_OTHER)
Admission: RE | Admit: 2013-01-15 | Discharge: 2013-01-15 | Disposition: A | Discharge status: Home / Self Care | Payer: Medicare Other | Source: Ambulatory Visit | Attending: Family Medicine | Admitting: Family Medicine

## 2013-01-15 DIAGNOSIS — R748 Abnormal levels of other serum enzymes: Secondary | ICD-10-CM

## 2013-01-15 DIAGNOSIS — R7989 Other specified abnormal findings of blood chemistry: Secondary | ICD-10-CM | POA: Insufficient documentation

## 2013-01-16 ENCOUNTER — Other Ambulatory Visit (INDEPENDENT_AMBULATORY_CARE_PROVIDER_SITE_OTHER): Payer: Self-pay

## 2013-01-16 LAB — VITAMIN B1: Vitamin B1 (Thiamine): 29 nmol/L (ref 8–30)

## 2013-01-19 LAB — VITAMIN B1: Vitamin B1 (Thiamine): 35 nmol/L — ABNORMAL HIGH (ref 8–30)

## 2013-01-27 ENCOUNTER — Encounter (HOSPITAL_COMMUNITY): Payer: Self-pay | Admitting: *Deleted

## 2013-01-27 ENCOUNTER — Encounter (HOSPITAL_COMMUNITY)
Admission: RE | Disposition: A | Discharge status: Home / Self Care | Payer: Self-pay | Source: Ambulatory Visit | Attending: Surgery

## 2013-01-27 ENCOUNTER — Ambulatory Visit (HOSPITAL_COMMUNITY)
Admission: RE | Admit: 2013-01-27 | Discharge: 2013-01-27 | Disposition: A | Discharge status: Home / Self Care | Payer: Medicare Other | Source: Ambulatory Visit | Attending: Surgery | Admitting: Surgery

## 2013-01-27 DIAGNOSIS — I10 Essential (primary) hypertension: Secondary | ICD-10-CM | POA: Insufficient documentation

## 2013-01-27 DIAGNOSIS — E7800 Pure hypercholesterolemia, unspecified: Secondary | ICD-10-CM | POA: Insufficient documentation

## 2013-01-27 DIAGNOSIS — Z9884 Bariatric surgery status: Secondary | ICD-10-CM | POA: Insufficient documentation

## 2013-01-27 DIAGNOSIS — R1013 Epigastric pain: Secondary | ICD-10-CM | POA: Insufficient documentation

## 2013-01-27 DIAGNOSIS — Z79899 Other long term (current) drug therapy: Secondary | ICD-10-CM | POA: Insufficient documentation

## 2013-01-27 DIAGNOSIS — IMO0002 Reserved for concepts with insufficient information to code with codable children: Secondary | ICD-10-CM | POA: Insufficient documentation

## 2013-01-27 DIAGNOSIS — E119 Type 2 diabetes mellitus without complications: Secondary | ICD-10-CM | POA: Insufficient documentation

## 2013-01-27 DIAGNOSIS — F3289 Other specified depressive episodes: Secondary | ICD-10-CM | POA: Insufficient documentation

## 2013-01-27 DIAGNOSIS — R109 Unspecified abdominal pain: Secondary | ICD-10-CM

## 2013-01-27 DIAGNOSIS — F329 Major depressive disorder, single episode, unspecified: Secondary | ICD-10-CM | POA: Insufficient documentation

## 2013-01-27 DIAGNOSIS — R51 Headache: Secondary | ICD-10-CM | POA: Insufficient documentation

## 2013-01-27 DIAGNOSIS — K219 Gastro-esophageal reflux disease without esophagitis: Secondary | ICD-10-CM | POA: Insufficient documentation

## 2013-01-27 HISTORY — DX: Adverse effect of unspecified anesthetic, initial encounter: T41.45XA

## 2013-01-27 HISTORY — PX: ESOPHAGOGASTRODUODENOSCOPY: SHX5428

## 2013-01-27 HISTORY — DX: Other complications of anesthesia, initial encounter: T88.59XA

## 2013-01-27 SURGERY — EGD (ESOPHAGOGASTRODUODENOSCOPY)
Anesthesia: Moderate Sedation

## 2013-01-27 MED ORDER — DIPHENHYDRAMINE HCL 50 MG/ML IJ SOLN
INTRAMUSCULAR | Status: AC
  Filled 2013-01-27: qty 1

## 2013-01-27 MED ORDER — MIDAZOLAM HCL 10 MG/2ML IJ SOLN
INTRAMUSCULAR | Status: DC | PRN
  Administered 2013-01-27 (×2): 2 mg via INTRAVENOUS
  Administered 2013-01-27: 1 mg via INTRAVENOUS

## 2013-01-27 MED ORDER — FENTANYL CITRATE 0.05 MG/ML IJ SOLN
INTRAMUSCULAR | Status: DC | PRN
  Administered 2013-01-27 (×2): 25 ug via INTRAVENOUS

## 2013-01-27 MED ORDER — MIDAZOLAM HCL 10 MG/2ML IJ SOLN
INTRAMUSCULAR | Status: AC
  Filled 2013-01-27: qty 2

## 2013-01-27 MED ORDER — SODIUM CHLORIDE 0.9 % IV SOLN
INTRAVENOUS | Status: DC
  Administered 2013-01-27: 500 mL via INTRAVENOUS

## 2013-01-27 MED ORDER — BUTAMBEN-TETRACAINE-BENZOCAINE 2-2-14 % EX AERO
INHALATION_SPRAY | CUTANEOUS | Status: DC | PRN
  Administered 2013-01-27: 3 via TOPICAL

## 2013-01-27 MED ORDER — FENTANYL CITRATE 0.05 MG/ML IJ SOLN
INTRAMUSCULAR | Status: AC
  Filled 2013-01-27: qty 2

## 2013-01-27 NOTE — Op Note (Signed)
01/27/2013  1:08 PM  PATIENT:  Theresa Scott, 57 y.o., female, MRN: [id number]  PREOP DIAGNOSIS:  Abdominal pain   POSTOP DIAGNOSIS:   Normal post RYGB.  No source of pain.  PROCEDURE:  Esophagogastrojejunoscopy  SURGEON:   Ovidio Kin, M.D.  ANESTHESIA:   Fentanyl  50 mcg   Versed 5 mg  INDICATIONS FOR PROCEDURE:  [name] is a 57 y.o. (DOB: June 07, 1956)  white  female whose primary care physician is [name],CATHERINE, MD and comes for upper endoscopy to evaluate upper abdominal pain.  The patient had a RYGB on 08/22/2010 by Dr. Ezzard Standing.   The indications and risks of the endoscopy were explained to the patient.  The risks include, but are not limited to, perforation, bleeding, or injury to the bowel.  If balloon dilatation is needed, the risk of perforation is higher.  PROCEDURE:  The patient was in room 3 at [facility] endoscopy unit.  The patient was monitored with a pulse oximetry, BP cuff, and EKG.  The patient has nasal O2 flowing during the procedure.   The back of the throat was anesthestized with Ceticaine x 3.  The patient was positioned in the left lateral decubitus position.  The patient was given Fentanyl and Versed.  A flexible Pentax endoscope was passed down the throat without difficulty.   Findings include:   Esophagus:   Normal   GE junction at:  41 cm   Stomach pouch: Normal   Gastrojejunal anastomosis:   46 cm   Efferent jejunal limb:  Normal (I went down 80 cm from incisors) Afferent jejunal limb:  Normal (10 cm)   CLO test:  Taken  PLAN:   Photos taken and given to patient.   No source of abdominal pain.  The patient was taking meloxicam, which she has stopped.  She is also taking prilosec Follow up with me in 3 months, if symptoms continue.  Otherwise anniversary visit in October 2014.  Ovidio Kin, MD, [facility] Pager: [phone] Office phone:  [phone]

## 2013-01-27 NOTE — Interval H&P Note (Signed)
History and Physical Interval Note:  01/27/2013 12:38 PM  Theresa Scott  has presented today for surgery, with the diagnosis of abdominal pain   The various methods of treatment have been discussed with the patient and family.  Her husband is with her.  After consideration of risks, benefits and other options for treatment, the patient has consented to  Procedure(s): ESOPHAGOGASTRODUODENOSCOPY (EGD) upper endoscopy (N/A) as a surgical intervention .    The patient's history has been reviewed, patient examined, no change in status, stable for surgery.  I have reviewed the patient's chart and labs.  Questions were answered to the patient's satisfaction.     Nochum Fenter H

## 2013-01-27 NOTE — H&P (View-Only) (Signed)
[facility]  Ovidio Kin, MD,  FACS [address] [place]  [address] [place], [place]    [address] Phone:  [phone] FAX:  [phone]   Re:   Theresa Scott DOB:   1955/12/04 MRN:   [id number]  ASSESSMENT AND PLAN: 1.  RYGB - 08/22/2010.  Initial weight - 267, BMI 44.5.  She is doing very well, though she has had a little weight gain.  Because of family issues, she is not exercising as much as she was.  Will see me back in one year, depending on the upper endo. [She did not have a formal appt, but was with her husband.  Her abdominal pain is better since stopping the meloxicam.  I gave her a copy of her labs.  Thiamine was no drawn.  To cut back on B12.   Dr. Linford Arnold had ordered an [place] - 01/14/2013 - which showed liver steatosis.  I plan to do an upper endo on 01/27/2013.  1a.  Epigastric pain - possibly secondary to NSAID use  Will plan upper endo to evaluate for potential marginal ulcer.  She'll stop the Meloxicam in the meantime.  2.  GERD. 3.  Hypertension. Off meds. 4.  Hypercholesterolemia.  Off meds. 5.  Diabetes mellitus.  Off meds. 6.  Degenerative back disease.  On hydrocodone daily or every other day.  Dr. Noel Gerold takes care of her.  She's had three back operations, last in 2010. 7.  History of depression - on meds 8.  History of kidney stones. 9.  Left sided HA - etiology unclear.  On NSAID for this.  She said that the headache has been blamed on cervical degenerative disease - but she has not neck pain. 10.  Pannus with redundant skin.  HISTORY OF PRESENT ILLNESS: Chief Complaint  Patient presents with  . Bariatric Follow Up   Theresa Scott is a 57 y.o. (DOB: [birth year])  white female who is a patient of [name],CATHERINE, MD (she did see Dr. Seymour Bars, but Dr. Cathey Endow is now doing bariatrics with ??) and comes to me today for RYGB.  She has some complaints of epigastric pain and vomits just about every other day.  This has been going on for 2  or 3 months..  She was placed on Meloxicam about 6 months ago for left sided headaches.   She had CT scan and MRI of her head in November 2013, which was negative.  She sees [place], [place], in [place], who placed her on the Meloxicam.   She talked a little about her weight gain.  She and her family have had a lot going on and she has not been exercising as well.  I think one part of her weight increase is that she is exercising less than before.  About 1 years ago, she ran in the Women[place] 5K.  She also asked about the redundant skin around her abdomen (pannus).  She's had some constipation, but takes Trail mix to help with this.  Dr. Linford Arnold has also checked some recent labs because she has an increased LFT's.  I suggested that she also get B12, Thiamine, and Folate checked.  Social History: Her husband is with her. I see her husband, Marwa Fuhrman ([id number]). Son,James, is with them.  PHYSICAL EXAM: BP 124/82  Pulse 78  Temp(Src) 98.6 F (37 C) (Temporal)  Resp 14  Ht 5\' 7"  (1.702 m)  Wt 200 lb (90.719 kg)  BMI 31.32 kg/m2  General: WN WF  who is alert and generally healthy appearing.  HEENT: Normal. Pupils equal. Good dentition. Neck: Supple. No mass.  No thyroid mass.  Carotid pulse okay with no bruit. Lymph Nodes:  No supraclavicular or cervical nodes. Lungs: Clear to auscultation and symmetric breath sounds. Heart:  RRR. No murmur or rub.  Abdomen: Soft. No mass. No hernia. Normal bowel sounds.  RUQ scar.  Laparoscopic scars.  She complains of epigastric soreness, but I don't feel anything. Extremities:  Good strength and ROM  in upper and lower extremities. Neurologic:  Grossly intact to motor and sensory function. Psychiatric: Has normal mood and affect. Behavior is normal.   DATA REVIEWED: Epic notes.  Ovidio Kin, MD, FACS Office:  [phone]

## 2013-01-28 ENCOUNTER — Telehealth (INDEPENDENT_AMBULATORY_CARE_PROVIDER_SITE_OTHER): Payer: Self-pay | Admitting: General Surgery

## 2013-01-28 ENCOUNTER — Encounter (HOSPITAL_COMMUNITY): Payer: Self-pay | Admitting: Surgery

## 2013-01-28 LAB — CLOTEST (H. PYLORI), BIOPSY: Helicobacter screen: NEGATIVE

## 2013-01-28 NOTE — Telephone Encounter (Signed)
Spoke with patient she states that po f/u visit is not due until 3 months I sent email to [facility] nurse  To confirm .

## 2013-03-05 ENCOUNTER — Other Ambulatory Visit: Payer: Self-pay | Admitting: *Deleted

## 2013-03-05 ENCOUNTER — Telehealth: Payer: Self-pay | Admitting: *Deleted

## 2013-03-05 MED ORDER — AMITRIPTYLINE HCL 25 MG PO TABS: ORAL_TABLET | ORAL | Status: DC

## 2013-03-05 NOTE — Telephone Encounter (Signed)
Pt calls and states you were trying to take her off the elavil and told her if it didn't work to call you back. Pt states that she needs this med- not resting well, feels tried and would a rx sent to her pharmacy please. Barry Dienes, LPN

## 2013-03-05 NOTE — Progress Notes (Signed)
Refill of amitriptyline sent to [facility].[name]

## 2013-03-05 NOTE — Telephone Encounter (Signed)
Pt notified and med sent 

## 2013-03-05 NOTE — Telephone Encounter (Signed)
Okay to restart the medication. Okay to send a new prescription. Tell her thank you for at least trying.

## 2013-04-01 ENCOUNTER — Other Ambulatory Visit: Payer: Self-pay | Admitting: Family Medicine

## 2013-04-15 ENCOUNTER — Telehealth: Payer: Self-pay | Admitting: *Deleted

## 2013-04-15 NOTE — Telephone Encounter (Signed)
Called [facility] and reordered pt's pristiq 100 mg tabs. Order # is [id number].Loralee Pacas [place]

## 2013-05-15 ENCOUNTER — Ambulatory Visit (INDEPENDENT_AMBULATORY_CARE_PROVIDER_SITE_OTHER): Payer: Medicare Other | Admitting: Family Medicine

## 2013-05-15 ENCOUNTER — Encounter: Payer: Self-pay | Admitting: Family Medicine

## 2013-05-15 VITALS — BP 108/63 | HR 74 | Ht 67.0 in | Wt 197.0 lb

## 2013-05-15 DIAGNOSIS — R5381 Other malaise: Secondary | ICD-10-CM

## 2013-05-15 DIAGNOSIS — R7301 Impaired fasting glucose: Secondary | ICD-10-CM

## 2013-05-15 DIAGNOSIS — R5383 Other fatigue: Secondary | ICD-10-CM

## 2013-05-15 LAB — COMPLETE METABOLIC PANEL WITH GFR
ALT: 33 U/L (ref 0–35)
AST: 49 U/L — ABNORMAL HIGH (ref 0–37)
Albumin: 4.3 g/dL (ref 3.5–5.2)
Alkaline Phosphatase: 107 U/L (ref 39–117)
BUN: 12 mg/dL (ref 6–23)
CO2: 30 mEq/L (ref 19–32)
Calcium: 9.4 mg/dL (ref 8.4–10.5)
Chloride: 103 mEq/L (ref 96–112)
Creat: 0.73 mg/dL (ref 0.50–1.10)
GFR, Est African American: >89 mL/min
GFR, Est Non African American: >89 mL/min
Glucose, Bld: 88 mg/dL (ref 70–99)
Potassium: 4.5 mEq/L (ref 3.5–5.3)
Sodium: 142 mEq/L (ref 135–145)
Total Bilirubin: 0.5 mg/dL (ref 0.3–1.2)
Total Protein: 6.9 g/dL (ref 6.0–8.3)

## 2013-05-15 LAB — CBC WITH DIFFERENTIAL/PLATELET
Basophils Absolute: 0 10*3/uL (ref 0.0–0.1)
Basophils Relative: 1 % (ref 0–1)
Eosinophils Absolute: 0.1 10*3/uL (ref 0.0–0.7)
Eosinophils Relative: 2 % (ref 0–5)
HCT: 37.4 % (ref 36.0–46.0)
Hemoglobin: 12.6 g/dL (ref 12.0–15.0)
Lymphocytes Relative: 32 % (ref 12–46)
Lymphs Abs: 2.1 10*3/uL (ref 0.7–4.0)
MCH: 28.4 pg (ref 26.0–34.0)
MCHC: 33.7 g/dL (ref 30.0–36.0)
MCV: 84.4 fL (ref 78.0–100.0)
Monocytes Absolute: 0.6 10*3/uL (ref 0.1–1.0)
Monocytes Relative: 9 % (ref 3–12)
Neutro Abs: 3.8 10*3/uL (ref 1.7–7.7)
Neutrophils Relative %: 56 % (ref 43–77)
Platelets: 209 10*3/uL (ref 150–400)
RBC: 4.43 MIL/uL (ref 3.87–5.11)
RDW: 13.7 % (ref 11.5–15.5)
WBC: 6.7 10*3/uL (ref 4.0–10.5)

## 2013-05-15 LAB — POCT GLYCOSYLATED HEMOGLOBIN (HGB A1C): Hemoglobin A1C: 6

## 2013-05-15 LAB — FERRITIN: Ferritin: 12 ng/mL (ref 10–291)

## 2013-05-15 LAB — FOLATE: Folate: >20 ng/mL

## 2013-05-15 LAB — MAGNESIUM: Magnesium: 1.9 mg/dL (ref 1.5–2.5)

## 2013-05-15 LAB — VITAMIN B12: Vitamin B-12: 1283 pg/mL — ABNORMAL HIGH (ref 211–911)

## 2013-05-15 LAB — TSH: TSH: 1.694 u[IU]/mL (ref 0.350–4.500)

## 2013-05-15 NOTE — Patient Instructions (Addendum)
Stop enalapril and your lipitor We will recheck your cholesterol at your next visit so make sure to fast.

## 2013-05-15 NOTE — Progress Notes (Signed)
  Subjective:    Patient ID: Theresa Scott, female    DOB: 1956-01-27, 57 y.o.   MRN: [id number]  HPI Fatigue x 6 mo. No sleep issues. Gets about 9 hours of sleep at night.  + snoring. + daytime fatigue.  No witnessed apneas.  + occ AM HA.  Hx of bypass surgery.  Says can fall asleep while talking on the phone sometimes.  Struggles with driving, so tries to drive only short distances..   IFG - overall she's doing well. We have downgraded her to impaired fasting glucose since she had her bariatric surgery. She's off of all diabetic medications. She's asking today if she needs to continue her Lipitor and her ACE inhibitor since these were added because of her diagnosis of diabetes and not because of blood pressure or elevated cholesterol levels. She denies any hypoglycemic events. No wounds that are not healing well. Lab Results  Component Value Date   HGBA1C 5.8 01/13/2013     Review of Systems No fever, chills, LN swollen.     Objective:   Physical Exam  Constitutional: She is oriented to person, place, and time. She appears well-developed and well-nourished.  HENT:  Head: Normocephalic and atraumatic.  Neck: Neck supple. No thyromegaly present.  Cardiovascular: Normal rate, regular rhythm and normal heart sounds.   Pulmonary/Chest: Effort normal and breath sounds normal.  Lymphadenopathy:    She has no cervical adenopathy.  Neurological: She is alert and oriented to person, place, and time.  Skin: Skin is warm and dry.  Psychiatric: She has a normal mood and affect. Her behavior is normal.          Assessment & Plan:  Fatigue - Eval for anemia, thyroid, etc.  Will also screen for OSA as well. She answered positive to 3 questions on the scout pain questionnaire. Her neck circumference was 14 inches., 33-1/2 cm. BMI is 30. This is a positive screen. If her blood work comes back normal then consider referral for sleep apnea evaluation. Encouraged t her to be extremely careful driving.  And not to get in the car and drive if she feels tired her so be that day.  IFG - well controlled.  Her A1c is up slightly today compared to last time she still in the impaired fasting glucose range. Continue work on diet and exercise. We will stop her L call if any palms. Followup in 4 months. At that time we'll repeat a hemoglobin A1c and we'll repeat her lipids, fasting since she will be off of her statin.ipitor and ACE inhibitor as these were added. Because the diagnosis of diabetes.

## 2013-05-19 ENCOUNTER — Other Ambulatory Visit: Payer: Self-pay | Admitting: Family Medicine

## 2013-05-20 ENCOUNTER — Other Ambulatory Visit: Payer: Self-pay | Admitting: Family Medicine

## 2013-05-20 DIAGNOSIS — E669 Obesity, unspecified: Secondary | ICD-10-CM

## 2013-05-20 DIAGNOSIS — R400 Somnolence: Secondary | ICD-10-CM

## 2013-05-20 DIAGNOSIS — R0683 Snoring: Secondary | ICD-10-CM

## 2013-06-01 ENCOUNTER — Other Ambulatory Visit: Payer: Self-pay | Admitting: Family Medicine

## 2013-06-18 ENCOUNTER — Ambulatory Visit (HOSPITAL_BASED_OUTPATIENT_CLINIC_OR_DEPARTMENT_OTHER): Payer: Medicare Other | Attending: Family Medicine | Admitting: Radiology

## 2013-06-18 VITALS — Ht 67.0 in | Wt 192.0 lb

## 2013-06-18 DIAGNOSIS — R0683 Snoring: Secondary | ICD-10-CM

## 2013-06-18 DIAGNOSIS — E669 Obesity, unspecified: Secondary | ICD-10-CM

## 2013-06-18 DIAGNOSIS — R400 Somnolence: Secondary | ICD-10-CM

## 2013-06-18 DIAGNOSIS — G4700 Insomnia, unspecified: Secondary | ICD-10-CM | POA: Insufficient documentation

## 2013-06-22 DIAGNOSIS — R0989 Other specified symptoms and signs involving the circulatory and respiratory systems: Secondary | ICD-10-CM

## 2013-06-22 DIAGNOSIS — R0609 Other forms of dyspnea: Secondary | ICD-10-CM

## 2013-06-22 NOTE — Procedures (Signed)
NAME:  Theresa Scott, Theresa Scott                 ACCOUNT NO.:  [id number]  MEDICAL RECORD NO.:  [id number]          PATIENT TYPE:  OUT  LOCATION:  SLEEP CENTER                 FACILITY:  [facility]  PHYSICIAN:  Clinton D. Maple Hudson, MD, FCCP, FACPDATE OF BIRTH:  1955[birth year]12-[birth year]  DATE OF STUDY:  06/18/2013                           NOCTURNAL POLYSOMNOGRAM  REFERRING PHYSICIAN:  Nani Gasser, M.D.  INDICATION FOR STUDY:  Insomnia with sleep apnea.  EPWORTH SLEEPINESS SCORE:  18/24.  BMI 30.1, weight 192 pounds, height 67 inches, neck 14 inches.  MEDICATIONS:  Home medications are charted for review.  SLEEP ARCHITECTURE:  Total sleep time 344 minutes with sleep efficiency 91.7%.  Stage I was 6%, stage II 67.2%, stage III absent, REM 26.9% of total sleep time.  Sleep latency 13 minutes, REM latency 19 minutes. Awake after sleep onset 17.5 minutes.  Arousal index 7.7.  Bedtime Medication:  Amitriptyline, hydroquinone/acetaminophen, gabapentin, multivitamin with iron.  RESPIRATORY DATA:  Apnea-hypopnea index (AHI) 4.5 per hour.  A total of 26 events was scored including 6 obstructive apneas, 2 central apneas, 18 hypopneas.  Events were more common while supine.  REM/AHI 6.5 per hour.  There were not enough events to qualify for split protocol CPAP titration.  OXYGEN DATA:  Mild to occasionally moderately loud snoring with oxygen desaturation to a nadir of 77% and mean oxygen saturation through the study of 94.7% on room air.  CARDIAC DATA:  Sinus rhythm with occasional PAC.  MOVEMENT-PARASOMNIA:  A few incidental limb jerks were noted with no effect on sleep.  No bathroom trips.  IMPRESSIONS-RECOMMENDATIONS: 1. Sleep architecture was significant for time spent predominantly in     stages II, and REM, reflecting medication effect, with a significant list     of potentially sedating bedtime medications as noted above. 2. Occasional respiratory events with sleep disturbance, within normal     limits.   AHI 4.5 per hour (the normal AHI for adults is from 0-5     events per hour).  Mild to occasionally moderately loud snoring     with oxygen desaturation to a nadir of 77% and mean oxygen     saturation through the study of 94.7% on room air. 3. Consider if her medication schedule could be changed to take     advantage of sedating effect starting earlier in the evening and     with more opportunity for it to wear off by morning time.     Clinton D. Maple Hudson, MD, [facility], FACP Diplomate, American Board of Sleep Medicine    CDY/MEDQ  D:  06/22/2013 13:44:13  T:  06/22/2013 36:64:40  Job:  [id number]

## 2013-06-23 ENCOUNTER — Telehealth: Payer: Self-pay | Admitting: Family Medicine

## 2013-06-23 NOTE — Telephone Encounter (Signed)
Please call pt: Needs appt to discuss sleep study results. (15 min slot)

## 2013-06-24 NOTE — Telephone Encounter (Signed)
lvm .Theresa Scott  

## 2013-06-25 ENCOUNTER — Other Ambulatory Visit: Payer: Self-pay | Admitting: *Deleted

## 2013-06-25 MED ORDER — DESVENLAFAXINE SUCCINATE ER 100 MG PO TB24: 100.0000 mg | ORAL_TABLET | Freq: Every day | ORAL | Status: DC

## 2013-06-26 ENCOUNTER — Ambulatory Visit (INDEPENDENT_AMBULATORY_CARE_PROVIDER_SITE_OTHER): Payer: Medicare Other | Admitting: Family Medicine

## 2013-06-26 ENCOUNTER — Encounter: Payer: Self-pay | Admitting: Family Medicine

## 2013-06-26 VITALS — BP 127/76 | HR 75 | Wt 198.0 lb

## 2013-06-26 DIAGNOSIS — G4710 Hypersomnia, unspecified: Secondary | ICD-10-CM

## 2013-06-26 DIAGNOSIS — G4720 Circadian rhythm sleep disorder, unspecified type: Secondary | ICD-10-CM

## 2013-06-26 DIAGNOSIS — Z23 Encounter for immunization: Secondary | ICD-10-CM

## 2013-06-26 DIAGNOSIS — R400 Somnolence: Secondary | ICD-10-CM

## 2013-06-26 DIAGNOSIS — Z825 Family history of asthma and other chronic lower respiratory diseases: Secondary | ICD-10-CM

## 2013-06-26 DIAGNOSIS — Z8379 Family history of other diseases of the digestive system: Secondary | ICD-10-CM

## 2013-06-26 NOTE — Progress Notes (Signed)
Subjective:    Patient ID: Theresa Scott, female    DOB: 03-22-1956, 57 y.o.   MRN: [id number]  HPI  She is here today to go over her sleep study results. She previously had sleep apnea before having gastric bypass surgery in 2011. She has had excessive daytime somnolence and wondered if she still had residual sleep apnea. She was never formally tested she just quit using her equipment after having her bypass surgery losing a large amount of weight. She says most night she doesn't actually go to bed until between midnight or 2:00 in the morning. She says she will sleep frequently throughout the day. She feels like the actual number of hours that she sleeps is fine, she just feels excessively tired during the day. In fact she will not drive by herself because she feels like when she gets in the car to drive that she gets very sleepy, and the she has been using turned out.  She also wanted to bring to my attention that she has 2 siblings with liver disease. Her younger brother recently died from liver cancer. He was a drinker. Her older sister who was not a drinker was also diagnosed with cirrhosis. They felt like he could be secondary to chronic pain medications. She also has a lesion on her liver that they're following the have not diagnosed or truly with cancer. She wonders if this could be some type of genetic issue and if she should be concerned. She also has a strong family history of COPD. Review of Systems     BP 127/76  Pulse 75  Wt 198 lb (89.812 kg)  BMI 31 kg/m2    Allergies  Allergen Reactions  . Loratadine Itching    REACTION: dizziness  . Nsaids Other (See Comments)    GI intolerance.    . Latex Rash    Past Medical History  Diagnosis Date  . Chronic kidney disease   . Diabetes mellitus   . Hypertension   . GERD (gastroesophageal reflux disease)   . Hypercholesterolemia   . Degenerative disc disease   . Depression   . Complication of anesthesia     Past Surgical  History  Procedure Laterality Date  . Tonsillectomy  1971  . Cholecystectomy  1976  . Hand surgery  2000    right  . Back surgery  1990,2003,2004,2010  . Cardiac surgery  2002  . Cardiac catheterization  2011    [facility].   . Gastric bypass  08/22/10  . Esophagogastroduodenoscopy N/A 01/27/2013    Procedure: ESOPHAGOGASTRODUODENOSCOPY (EGD) upper endoscopy;  Surgeon: Kandis Cocking, MD;  Location: [facility];  Service: General;  Laterality: N/A;    History   Social History  . Marital Status: Married    Spouse Name: N/A    Number of Children: N/A  . Years of Education: N/A   Occupational History  . Not on file.   Social History Main Topics  . Smoking status: Former Smoker -- 0.50 packs/day for 18 years    Quit date: 08/04/1990  . Smokeless tobacco: Never Used  . Alcohol Use: No  . Drug Use: No  . Sexual Activity: Not on file   Other Topics Concern  . Not on file   Social History Narrative  . No narrative on file    Family History  Problem Relation Age of Onset  . Cirrhosis Sister     secondary medication  . Liver disease Brother   . Kidney failure Brother  Dialysis at 40, born single kidney  . Liver cancer Brother     Outpatient Encounter Prescriptions as of 06/26/2013  Medication Sig Dispense Refill  . amitriptyline (ELAVIL) 25 MG tablet Take 1-2 tablets at bedtime as needed  60 tablet  3  . Calcium Carbonate 1500 MG TABS Take 2 tablets by mouth daily.        . Cyanocobalamin (VITAMIN B-12 PO) Take 1 tablet by mouth every 7 (seven) days. Takes on Wed      . desvenlafaxine (PRISTIQ) 100 MG 24 hr tablet Take 1 tablet (100 mg total) by mouth daily.  90 tablet  1  . EPINEPHrine (EPIPEN) 0.3 mg/0.3 mL DEVI Inject 0.3 mLs (0.3 mg total) into the muscle as needed.  1 Device  0  . gabapentin (NEURONTIN) 300 MG capsule Take 300 mg by mouth 2 (two) times daily.      Marland Kitchen HYDROcodone-acetaminophen (NORCO) 10-325 MG per tablet Take 1 tablet by mouth every 6 (six)  hours as needed. For back pain  30 tablet  0  . Multiple Vitamin (MULTIVITAMIN) tablet Take 1 tablet by mouth 2 (two) times daily.       Marland Kitchen omeprazole (PRILOSEC) 20 MG capsule TAKE ONE CAPSULE BY MOUTH DAILY  30 capsule  3  . SUMAtriptan-naproxen (TREXIMET) 85-500 MG per tablet Take 1 tablet by mouth every 2 (two) hours as needed.      . traMADol (ULTRAM) 50 MG tablet Take 50 mg by mouth every 6 (six) hours as needed for pain.       No facility-administered encounter medications on file as of 06/26/2013.       Objective:   Physical Exam  Constitutional: She is oriented to person, place, and time. She appears well-developed and well-nourished.  HENT:  Head: Normocephalic and atraumatic.  Eyes: Conjunctivae and EOM are normal.  Cardiovascular: Normal rate.   Pulmonary/Chest: Effort normal.  Neurological: She is alert and oriented to person, place, and time.  Skin: Skin is warm and dry. No pallor.  Psychiatric: She has a normal mood and affect. Her behavior is normal.          Assessment & Plan:  Sleep disorder-I. did review her sleep study results with her. She had no significant apnea. They did recommend adjusting her sedative medications including her amitriptyline and Neurontin. Though she already takes them about 2 hours before bedtime. Thus, I do not think that they are contributing to her morning sedation. It sounds like the bigger issue is that she has really poor sleep habits overall. Though the Neurontin certainly could be causing some daytime sedation since he takes it twice a day. I explained her that some people are extremely sensitive to the medication it makes him feel groggy or tired throughout the day. She may want to try cutting back down to once a day to see if she notices a difference in the next month. We also discussed a strategy of setting a specific bedtime and wake time. And even if she doesn't sleep well she needs to get up at that bedtime. We discussed the  importance of creating routine bedtime to help with her natural melatonin release and circadian rhythms of the brain. I would like to see if this makes a big difference as far as, she is sleeping during the daytime if she starts to feel more alert. She's not on any true sleep aid she is just on several medications that can cause sedation. We could consider using a sleep  aid if needed to help reset her sleep cycle. Interestingly, she works second shift for years and feels like she is more of a night owl.  We also discussed referring her to a sleep specialist. She says she would like to try working on a set sleep schedule for at least a month and see how that goes before seeing a specialist.  With a family history of 2 siblings with cirrhosis and one with liver cancer as well as several family members with COPD I do think it would be reasonable to check her for alpha 1 antitrypsin deficiency.  Time spent 25 minutes, greater than 50% of time counseling about sleep disorder.

## 2013-06-26 NOTE — Patient Instructions (Signed)

## 2013-06-27 LAB — ALPHA-1-ANTITRYPSIN: A-1 Antitrypsin, Ser: 133 mg/dL (ref 90–200)

## 2013-06-28 NOTE — Progress Notes (Signed)
Quick Note:  All labs are normal. ______ 

## 2013-07-01 ENCOUNTER — Telehealth: Payer: Self-pay | Admitting: *Deleted

## 2013-07-01 ENCOUNTER — Telehealth: Payer: Self-pay

## 2013-07-01 NOTE — Telephone Encounter (Signed)
Pt called back and stated that Dr. Linford Arnold orders this for her from the company. So I looked back in her chart and found the letter and called the company [phone] (pt ID [id number]) and ordered her pristiq for her. They will ship it to our office.Marland KitchenMarland KitchenLoralee Pacas [place]

## 2013-07-01 NOTE — Telephone Encounter (Signed)
Her order # [id number] and it will arrive in 7-10 days. Pt called and informed.Loralee Pacas [place]

## 2013-07-01 NOTE — Telephone Encounter (Signed)
Pt stated that her pristiq was $275 and she usually does not have to pay for this. I called her back to discuss this with her I had to leave a vm .Loralee Pacas [place]

## 2013-07-01 NOTE — Telephone Encounter (Signed)
Patient called for pharmacy phone number.

## 2013-07-08 ENCOUNTER — Telehealth: Payer: Self-pay | Admitting: *Deleted

## 2013-07-08 NOTE — Telephone Encounter (Signed)
Called and informed pt that meds were up front ready for p/u.Theresa Scott [place]

## 2013-08-08 ENCOUNTER — Telehealth: Payer: Self-pay

## 2013-08-08 NOTE — Telephone Encounter (Signed)
FYI Patient called and stated she has pressure in her chest. I advised her to either call 911 immediately or have her husband drive her right now to the ED.

## 2013-08-08 NOTE — Telephone Encounter (Signed)
I agree

## 2013-08-09 ENCOUNTER — Encounter (HOSPITAL_COMMUNITY): Payer: Self-pay | Admitting: Emergency Medicine

## 2013-08-09 ENCOUNTER — Emergency Department (HOSPITAL_COMMUNITY)
Admission: EM | Admit: 2013-08-09 | Discharge: 2013-08-09 | Disposition: A | Discharge status: Home / Self Care | Payer: Medicare Other | Attending: Emergency Medicine | Admitting: Emergency Medicine

## 2013-08-09 ENCOUNTER — Emergency Department (HOSPITAL_COMMUNITY): Payer: Medicare Other

## 2013-08-09 DIAGNOSIS — R0789 Other chest pain: Secondary | ICD-10-CM

## 2013-08-09 DIAGNOSIS — R0602 Shortness of breath: Secondary | ICD-10-CM | POA: Insufficient documentation

## 2013-08-09 DIAGNOSIS — K219 Gastro-esophageal reflux disease without esophagitis: Secondary | ICD-10-CM | POA: Insufficient documentation

## 2013-08-09 DIAGNOSIS — F329 Major depressive disorder, single episode, unspecified: Secondary | ICD-10-CM | POA: Insufficient documentation

## 2013-08-09 DIAGNOSIS — Z91040 Latex allergy status: Secondary | ICD-10-CM | POA: Insufficient documentation

## 2013-08-09 DIAGNOSIS — F3289 Other specified depressive episodes: Secondary | ICD-10-CM | POA: Insufficient documentation

## 2013-08-09 DIAGNOSIS — E119 Type 2 diabetes mellitus without complications: Secondary | ICD-10-CM | POA: Insufficient documentation

## 2013-08-09 DIAGNOSIS — Z79899 Other long term (current) drug therapy: Secondary | ICD-10-CM | POA: Insufficient documentation

## 2013-08-09 DIAGNOSIS — R609 Edema, unspecified: Secondary | ICD-10-CM | POA: Insufficient documentation

## 2013-08-09 DIAGNOSIS — I129 Hypertensive chronic kidney disease with stage 1 through stage 4 chronic kidney disease, or unspecified chronic kidney disease: Secondary | ICD-10-CM | POA: Insufficient documentation

## 2013-08-09 DIAGNOSIS — Z87891 Personal history of nicotine dependence: Secondary | ICD-10-CM | POA: Insufficient documentation

## 2013-08-09 DIAGNOSIS — N189 Chronic kidney disease, unspecified: Secondary | ICD-10-CM | POA: Insufficient documentation

## 2013-08-09 LAB — POCT I-STAT TROPONIN I: Troponin i, poc: 0 ng/mL (ref 0.00–0.08)

## 2013-08-09 LAB — CBC
HCT: 39.5 % (ref 36.0–46.0)
Hemoglobin: 13.5 g/dL (ref 12.0–15.0)
MCH: 30.5 pg (ref 26.0–34.0)
MCHC: 34.2 g/dL (ref 30.0–36.0)
MCV: 89.4 fL (ref 78.0–100.0)
Platelets: 175 10*3/uL (ref 150–400)
RBC: 4.42 MIL/uL (ref 3.87–5.11)
RDW: 13.8 % (ref 11.5–15.5)
WBC: 6.3 10*3/uL (ref 4.0–10.5)

## 2013-08-09 LAB — BASIC METABOLIC PANEL
BUN: 9 mg/dL (ref 6–23)
CO2: 26 mEq/L (ref 19–32)
Calcium: 9.1 mg/dL (ref 8.4–10.5)
Chloride: 105 mEq/L (ref 96–112)
Creatinine, Ser: 0.66 mg/dL (ref 0.50–1.10)
GFR calc Af Amer: >90 mL/min (ref >90)
GFR calc non Af Amer: >90 mL/min (ref >90)
Glucose, Bld: 108 mg/dL — ABNORMAL HIGH (ref 70–99)
Potassium: 4 mEq/L (ref 3.5–5.1)
Sodium: 140 mEq/L (ref 135–145)

## 2013-08-09 LAB — PRO B NATRIURETIC PEPTIDE: Pro B Natriuretic peptide (BNP): 207.2 pg/mL — ABNORMAL HIGH (ref 0–125)

## 2013-08-09 MED ORDER — ASPIRIN 81 MG PO CHEW
324.0000 mg | CHEWABLE_TABLET | Freq: Once | ORAL | Status: AC
  Administered 2013-08-09: 324 mg via ORAL
  Filled 2013-08-09: qty 4

## 2013-08-09 NOTE — ED Provider Notes (Signed)
Medical screening examination/treatment/procedure(s) were conducted as a shared visit with non-physician practitioner(s) and myself.  I personally evaluated the patient during the encounter  Doug Sou, MD 08/09/13 1545

## 2013-08-09 NOTE — ED Provider Notes (Signed)
Clinical suspicion of acute coronary syndrome is low. Heart scores 3  Doug Sou, MD 08/09/13 1340

## 2013-08-09 NOTE — Consult Note (Signed)
CARDIOLOGY CONSULT NOTE       Patient ID: KRISTYL ATHENS MRN: [id number] DOB/AGE: 57-25-1957 57 y.o.  Admit date: 08/09/2013 Referring Physician:  Ethelda Chick Primary Physician: Nani Gasser, MD Primary Cardiologist:  Shirlee Latch Reason for Consultation:  Chest Pain  Active Problems:   * No active hospital problems. *   HPI:  57 yo seen in ER for 48 hours of chest pain.  Previous history of pain Normal cath in 2008.  Had abnormal myovue in 2011 but repeat cath at that time by Dr Shirlee Latch also normal Pain is atypical Constant for 48 hours despite normal ECG and troponin.  No pleuritic or positional component.  No trauma No GI overtones She has just "beared" it and not taken anything for it.  Rests comfortably in ER but then  When asked says pain is 8/10.  Some radiation to left shoulder. Numbness and tingling on left side of face and arms.  No dyspnea on my history.  No palpitations or syncope.    ROS All other systems reviewed and negative except as noted above  Past Medical History  Diagnosis Date  . Chronic kidney disease   . Diabetes mellitus   . Hypertension   . GERD (gastroesophageal reflux disease)   . Hypercholesterolemia   . Degenerative disc disease   . Depression   . Complication of anesthesia     Family History  Problem Relation Age of Onset  . Cirrhosis Sister     secondary medication  . Liver disease Brother   . Kidney failure Brother     Dialysis at 14, born single kidney  . Liver cancer Brother     History   Social History  . Marital Status: Married    Spouse Name: N/A    Number of Children: N/A  . Years of Education: N/A   Occupational History  . Not on file.   Social History Main Topics  . Smoking status: Former Smoker -- 0.50 packs/day for 18 years    Quit date: 08/04/1990  . Smokeless tobacco: Never Used  . Alcohol Use: No  . Drug Use: No  . Sexual Activity: Not on file   Other Topics Concern  . Not on file   Social History Narrative   . No narrative on file    Past Surgical History  Procedure Laterality Date  . Tonsillectomy  1971  . Cholecystectomy  1976  . Hand surgery  2000    right  . Back surgery  1990,2003,2004,2010  . Cardiac surgery  2002  . Cardiac catheterization  2011    [facility].   . Gastric bypass  08/22/10  . Esophagogastroduodenoscopy N/A 01/27/2013    Procedure: ESOPHAGOGASTRODUODENOSCOPY (EGD) upper endoscopy;  Surgeon: Kandis Cocking, MD;  Location: [facility];  Service: General;  Laterality: N/A;  . Gastric bypass      Filed Vitals:   08/09/13 1451  BP: 140/85  Pulse: 64  Temp: 97.9 F (36.6 C)  Resp: 17      Physical Exam:   Affect appropriate Healthy:  appears stated age HEENT: normal Neck supple with no adenopathy JVP normal no bruits no thyromegaly Lungs clear with no wheezing and good diaphragmatic motion Heart:  S1/S2 no murmur, no rub, gallop or click PMI normal Abdomen: benighn, BS positve, no tenderness, no AAA no bruit.  No HSM or HJR Distal pulses intact with no bruits No edema Neuro non-focal Skin warm and dry No muscular weakness  No current facility-administered medications on file prior  to encounter.   Current Outpatient Prescriptions on File Prior to Encounter  Medication Sig Dispense Refill  . desvenlafaxine (PRISTIQ) 100 MG 24 hr tablet Take 1 tablet (100 mg total) by mouth daily.  90 tablet  1  . Multiple Vitamin (MULTIVITAMIN) tablet Take 1 tablet by mouth 2 (two) times daily.       . SUMAtriptan-naproxen (TREXIMET) 85-500 MG per tablet Take 1 tablet by mouth every 2 (two) hours as needed (for headaches).       . traMADol (ULTRAM) 50 MG tablet Take 50 mg by mouth every 6 (six) hours as needed for pain.      Marland Kitchen EPINEPHrine (EPIPEN) 0.3 mg/0.3 mL DEVI Inject 0.3 mLs (0.3 mg total) into the muscle as needed.  1 Device  0     Labs:   Lab Results  Component Value Date   WBC 6.3 08/09/2013   HGB 13.5 08/09/2013   HCT 39.5 08/09/2013   MCV 89.4  08/09/2013   PLT 175 08/09/2013    Recent Labs Lab 08/09/13 1234  NA 140  K 4.0  CL 105  CO2 26  BUN 9  CREATININE 0.66  CALCIUM 9.1  GLUCOSE 108*   Lab Results  Component Value Date   CKTOTAL 125 01/25/2010   CKMB 2.1 01/25/2010   TROPONINI  Value: <0.01        NO INDICATION OF MYOCARDIAL INJURY. 01/25/2010    Lab Results  Component Value Date   CHOL 101 12/02/2012   CHOL 92 07/31/2011   CHOL  Value: 101        ATP III CLASSIFICATION:  <200     mg/dL   Desirable  829-562  mg/dL   Borderline High  >=130    mg/dL   High        05/29/5783   Lab Results  Component Value Date   HDL 32* 12/02/2012   HDL 32* 07/31/2011   HDL 29* 01/25/2010   Lab Results  Component Value Date   LDLCALC 54 12/02/2012   LDLCALC 46 07/31/2011   LDLCALC  Value: 51        Total Cholesterol/HDL:CHD Risk Coronary Heart Disease Risk Table                     Men   Women  1/2 Average Risk   3.4   3.3  Average Risk       5.0   4.4  2 X Average Risk   9.6   7.1  3 X Average Risk  23.4   11.0        Use the calculated Patient Ratio above and the CHD Risk Table to determine the patient's CHD Risk.        ATP III CLASSIFICATION (LDL):  <100     mg/dL   Optimal  696-295  mg/dL   Near or Above                    Optimal  130-159  mg/dL   Borderline  284-132  mg/dL   High  >440     mg/dL   Very High 1/0/2725   Lab Results  Component Value Date   TRIG 75 12/02/2012   TRIG 68 07/31/2011   TRIG 104 01/25/2010   Lab Results  Component Value Date   CHOLHDL 3.2 12/02/2012   CHOLHDL 2.9 07/31/2011   CHOLHDL 3.5 01/25/2010   No results found for this basename: LDLDIRECT  Radiology: Dg Chest 2 View  08/09/2013   CLINICAL DATA:  Chest pain  EXAM: CHEST  2 VIEW  COMPARISON:  06/14/2010  FINDINGS: Normal heart size. Clear lungs. No pleural effusion or pneumothorax.  IMPRESSION: No active cardiopulmonary disease.   Electronically Signed   By: Maryclare Bean M.D.   On: 08/09/2013 12:58    EKG: NSR low voltage normal  Telemetry:  NSR  no arrhythmia 08/09/2013    ASSESSMENT AND PLAN:  Chest Pain:  Atypical.  Has had false positive myovue and two normal caths in 2008 and 2011.  D/C Outpatient f/u Dr Shirlee Latch.  Rx with NSAI's or tylenol.  In future cardiac CT would Be a good non invasive test for her But pain is so atypical now is not warranted.  CXR and medistinum also normal  Signed: Charlton Haws 08/09/2013, 2:58 PM

## 2013-08-09 NOTE — ED Provider Notes (Signed)
CSN: [id number]     Arrival date & time 08/09/13  1212 History   First MD Initiated Contact with Patient 08/09/13 1224     Chief Complaint  Patient presents with  . Chest Pain  . Shortness of Breath   (Consider location/radiation/quality/duration/timing/severity/associated sxs/prior Treatment) HPI Theresa Scott is a 57 year old female who presents emergency department with chief complaint of left-sided chest pain.  Pain has been constant and ongoing since Tuesday.  She describes constant pressure-like pain with paresthesia associated and left arm.  Patient denies any nausea or diaphoresis.  She does have some associated shortness of breath this is nonexertional and nonpleuritic.  The patient denies any history of calf pain or DVTs.  She has had some peripheral edema which she states is not new.  The patient has a history of SVT and previous ablation.Sh is followed by doctor Mclean  She is a distant smoking history which is a 5-pack-year history.  She is one father who died in his 51s of heart attack.  She is also diabetic with hypertension.  Patient had a similar instance of the exact same chest pain approximately 2 years ago.  Evaluation at that time was negative for acute coronary syndrome.   Past Medical History  Diagnosis Date  . Chronic kidney disease   . Diabetes mellitus   . Hypertension   . GERD (gastroesophageal reflux disease)   . Hypercholesterolemia   . Degenerative disc disease   . Depression   . Complication of anesthesia    Past Surgical History  Procedure Laterality Date  . Tonsillectomy  1971  . Cholecystectomy  1976  . Hand surgery  2000    right  . Back surgery  1990,2003,2004,2010  . Cardiac surgery  2002  . Cardiac catheterization  2011    [facility].   . Gastric bypass  08/22/10  . Esophagogastroduodenoscopy N/A 01/27/2013    Procedure: ESOPHAGOGASTRODUODENOSCOPY (EGD) upper endoscopy;  Surgeon: Kandis Cocking, MD;  Location: [facility];  Service: General;   Laterality: N/A;  . Gastric bypass     Family History  Problem Relation Age of Onset  . Cirrhosis Sister     secondary medication  . Liver disease Brother   . Kidney failure Brother     Dialysis at 65, born single kidney  . Liver cancer Brother    History  Substance Use Topics  . Smoking status: Former Smoker -- 0.50 packs/day for 18 years    Quit date: 08/04/1990  . Smokeless tobacco: Never Used  . Alcohol Use: Scott   OB History   Grav Para Term Preterm Abortions TAB SAB Ect Mult Living                 Review of Systems Ten systems reviewed and are negative for acute change, except as noted in the HPI.   Allergies  Loratadine; Nsaids; and Latex  Home Medications   Current Outpatient Rx  Name  Route  Sig  Dispense  Refill  . amitriptyline (ELAVIL) 25 MG tablet      Take 1-2 tablets at bedtime as needed   60 tablet   3   . Calcium Carbonate 1500 MG TABS   Oral   Take 2 tablets by mouth daily.           . Cyanocobalamin (VITAMIN B-12 PO)   Oral   Take 1 tablet by mouth every 7 (seven) days. Takes on Wed         . desvenlafaxine (PRISTIQ)  100 MG 24 hr tablet   Oral   Take 1 tablet (100 mg total) by mouth daily.   90 tablet   1   . EPINEPHrine (EPIPEN) 0.3 mg/0.3 mL DEVI   Intramuscular   Inject 0.3 mLs (0.3 mg total) into the muscle as needed.   1 Device   0   . gabapentin (NEURONTIN) 300 MG capsule   Oral   Take 300 mg by mouth 2 (two) times daily.         Marland Kitchen HYDROcodone-acetaminophen (NORCO) 10-325 MG per tablet   Oral   Take 1 tablet by mouth every 6 (six) hours as needed. For back pain   30 tablet   0   . Multiple Vitamin (MULTIVITAMIN) tablet   Oral   Take 1 tablet by mouth 2 (two) times daily.          Marland Kitchen omeprazole (PRILOSEC) 20 MG capsule      TAKE ONE CAPSULE BY MOUTH DAILY   30 capsule   3   . SUMAtriptan-naproxen (TREXIMET) 85-500 MG per tablet   Oral   Take 1 tablet by mouth every 2 (two) hours as needed.         .  traMADol (ULTRAM) 50 MG tablet   Oral   Take 50 mg by mouth every 6 (six) hours as needed for pain.          BP 136/78  Pulse 69  Temp(Src) 97.5 F (36.4 C) (Oral)  Resp 18  SpO2 96% Physical Exam Physical Exam  Nursing note and vitals reviewed. Constitutional: She is oriented to person, place, and time. She appears well-developed and well-nourished. Scott distress.  HENT:  Head: Normocephalic and atraumatic.  Eyes: Conjunctivae normal and EOM are normal. Pupils are equal, round, and reactive to light. Scott scleral icterus.  Neck: Normal range of motion.  Cardiovascular: Normal rate, regular rhythm and normal heart sounds.  Exam reveals Scott gallop and Scott friction rub.   Scott murmur heard. Pulmonary/Chest: Effort normal and breath sounds normal. Scott respiratory distress.  Abdominal: Soft. Bowel sounds are normal. She exhibits Scott distension and Scott mass. There is Scott tenderness. There is Scott guarding.  Neurological: She is alert and oriented to person, place, and time.  Skin: Skin is warm and dry. She is not diaphoretic.    ED Course  Procedures (including critical care time) Labs Review Labs Reviewed  CBC  BASIC METABOLIC PANEL  PRO B NATRIURETIC PEPTIDE   Imaging Review Scott results found.  EKG Interpretation     Ventricular Rate:  71 PR Interval:  152 QRS Duration: 68 QT Interval:  416 QTC Calculation: 452 R Axis:   33 Text Interpretation:  Normal sinus rhythm Low voltage QRS Borderline ECG Scott significant change since last tracing           ECG interpretation   Date: 08/09/2013  Rate: 71  Rhythm: normal sinus rhythm  QRS Axis: normal  Intervals: normal  ST/T Wave abnormalities: normal  Conduction Disutrbances: none  Narrative Interpretation:   Old EKG Reviewed:  Scott significant changes noted     MDM  Scott diagnosis found. 1:56 PM Patient here with atypical chest pain. EKG and can turn concerning for ischemic changes.  It is unchanged since her previous EKG  in October of 2014.  She is a negative troponin.  Glucose is mildly elevated.  She is a mildly elevated Pro BNP which is nonspecific.  His chest x-ray is negative for acute cardiopulmonary disease.  Patient's HEART score is 3 make her low risk for MACE.  I have spoken wit Dr. Eden Emms who will come see the patient there in the ED.    Dr. Eden Emms statest the patient is clear for Discharge. I agree with his assessment. She has had negative cardiac cath previously. She will follow up with cardiology this week.  Patient is to be discharged with recommendation to follow up with PCP in regards to today's hospital visit. Chest pain is not likely of cardiac or pulmonary etiology d/t presentation, perc negative, VSS, Scott tracheal deviation, Scott JVD or new murmur, RRR, breath sounds equal bilaterally, EKG without acute abnormalities, negative troponin, and negative CXR. Pt has been advised start a PPI and return to the ED is CP becomes exertional, associated with diaphoresis or nausea, radiates to left jaw/arm, worsens or becomes concerning in any way. Pt appears reliable for follow up and is agreeable to discharge.   Case has been discussed with and seen by Dr. Ethelda Chick who agrees with the above plan to discharge.     Arthor Captain, PA-C 08/09/13 [phone]

## 2013-08-09 NOTE — ED Notes (Signed)
Patient transported to X-ray 

## 2013-08-09 NOTE — ED Provider Notes (Signed)
Complains of anterior chest pain radiating to left arm for 2 days constant, nonexertional, associated with mild dyspnea. She denies headache. Denies other complaint. No treatment prior to coming here pain has been mild since onset. Constant and unchanged. Pain atypical for acute coronary syndrome.  Doug Sou, MD 08/09/13 [phone]

## 2013-08-09 NOTE — ED Notes (Signed)
Pt reports left side chest heaviness that started two days ago, radiating into her shoulder blade and now having tingling to left arm and face since yesterday. Having sob, headache and fatigue. Reports pain increases with breathing and movement. ekg done at triage, no acute distress noted at this time.

## 2013-08-09 NOTE — ED Notes (Signed)
[facility] at bedside. 

## 2013-08-18 ENCOUNTER — Ambulatory Visit (INDEPENDENT_AMBULATORY_CARE_PROVIDER_SITE_OTHER): Payer: Medicare Other | Admitting: Cardiology

## 2013-08-18 ENCOUNTER — Encounter: Payer: Self-pay | Admitting: Cardiology

## 2013-08-18 VITALS — BP 132/70 | HR 73 | Ht 67.0 in | Wt 195.0 lb

## 2013-08-18 DIAGNOSIS — I498 Other specified cardiac arrhythmias: Secondary | ICD-10-CM

## 2013-08-18 DIAGNOSIS — I471 Supraventricular tachycardia: Secondary | ICD-10-CM

## 2013-08-18 DIAGNOSIS — R079 Chest pain, unspecified: Secondary | ICD-10-CM

## 2013-08-18 DIAGNOSIS — I4719 Other supraventricular tachycardia: Secondary | ICD-10-CM | POA: Insufficient documentation

## 2013-08-18 MED ORDER — METOPROLOL TARTRATE 50 MG PO TABS: ORAL_TABLET | ORAL | Status: DC

## 2013-08-18 NOTE — Progress Notes (Signed)
Patient ID: Theresa Scott, female   DOB: [birth year], 57 y.o.   MRN: [id number] PCP: Dr. Linford Arnold  57 yo with history of AVNRT ablation, obesity, HTN presents for followup of chest pain. Patient had an episode of severe chest pressure that was prolonged, resulting in admission to [facility] in 4/11.  Lexiscan myoview was done, showing EF 58% and a small, mild mid anterior reversible perfusion defect. This was thought to be a low risk study with possible shifting breast attenuation. Patient was sent home but had 3 more episodes of central chest pressure after discharge. Given ongoing chest pain episodes and perfusion defect on myoview, I did a left heart cath in 4/11 which showed no angiographic coronary disease and normal EF.   Since I last saw her, Theresa Scott had a gastric bypass and has lost > 100 lbs.  She had done well with no chest pain until earlier this month when she developed central chest heaviness radiating to her left arm.  This has been going on for about 2 weeks now.  It happens periodically, no trigger.  Can last for 10-15 minutes.  No trigger.  She has associated numbness on the left side of her face.  She denies exertional dyspnea.  She is able to walk up steps without problems and can walk as far as she wants on flat ground.  She went to the ER on 10/18, workup there was unremarkable.  However, she has continued to have chest pressure episodes since she was seen in the ER.   Labs (11/10): LDL 75, HDL 41  Labs (2/11): TSH normal, creatinine 0.63  Labs (4/11): creatinine 0.73, LFTs normal, cardiac enzymes negative, lipase/amylase normal.  Labs (2/14): LDL 54, HDL 32 Labs (10/14): K 4, creatinine 0.66, BNP 207, TnI negative  ECG: probably ectopic atrial rhythm, otherwise normal.   Allergies (verified):  1) Claritin   Past Medical History:  1. HYPERTENSION 2. BACK PAIN, CHRONIC: Sees ortho and they write for pain meds. History of posterior spinal fusion.  3. Obesity: s/p gastric  bypass 4. GERD  5. HYPERLIPIDEMIA   6. OSTEOARTHRITIS  7. NEPHROLITHIASIS 8. DIABETES MELLITUS, TYPE II  9. DEPRESSION  10. Varicose veins  11. 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 Event monitor in 2/11 done for palpitations showed only PACs.  13. Chest pain: Lexiscan myoview (4/11) with EF 58%, normal wall motion, small mild reversible mid anterior defect thought to possibly be due to shifting breast attenuation. Low risk study. Patient continued to have chest pain so underwent LHC (4/11) showing no angiographic CAD and EF 60%.   Family History:  Family History Diabetes  Family History Hypertension  Family History of Alcoholism - mother  Family History of CAD Female   Social History:  2 children  Occupation:disabled  Married  Former smoker  ROS: All systems reviewed and negative except as per HPI.   Current Outpatient Prescriptions  Medication Sig Dispense Refill  . amitriptyline (ELAVIL) 25 MG tablet Take 25-50 mg by mouth at bedtime as needed (for restless legs).      . CALCIUM PO Take 2 tablets by mouth daily.      . Cyanocobalamin (VITAMIN B-12 PO) Take 1 tablet by mouth every Wednesday.      Marland Kitchen desvenlafaxine (PRISTIQ) 100 MG 24 hr tablet Take 1 tablet (100 mg total) by mouth daily.  90 tablet  1  . EPINEPHrine (EPIPEN) 0.3 mg/0.3 mL DEVI Inject 0.3  mLs (0.3 mg total) into the muscle as needed.  1 Device  0  . ferrous sulfate 325 (65 FE) MG tablet Take 325 mg by mouth daily with breakfast.      . gabapentin (NEURONTIN) 300 MG capsule Take 300 mg by mouth daily.      Marland Kitchen HYDROcodone-acetaminophen (NORCO) 10-325 MG per tablet Take 1 tablet by mouth every 6 (six) hours as needed (for back pain).      . IRON PO Take 1 tablet by mouth daily.      . Multiple Vitamin (MULTIVITAMIN) tablet Take 1 tablet by mouth 2 (two) times daily.       Marland Kitchen omeprazole (PRILOSEC) 20 MG capsule Take 20 mg by mouth every other day.      .  traMADol (ULTRAM) 50 MG tablet Take 50 mg by mouth every 6 (six) hours as needed for pain.      . metoprolol (LOPRESSOR) 50 MG tablet 1 tablet two  hours prior to test  1 tablet  0   No current facility-administered medications for this visit.   BP 132/70  Pulse 73  Ht 5\' 7"  (1.702 m)  Wt 88.451 kg (195 lb)  BMI 30.53 kg/m2 General: NAD Neck: No JVD, no thyromegaly or thyroid nodule.  Lungs: Clear to auscultation bilaterally with normal respiratory effort. CV: Nondisplaced PMI.  Heart regular S1/S2, no S3/S4, 1/6 SEM RUSB.  No peripheral edema.  No carotid bruit.  Normal pedal pulses.  Abdomen: Soft, nontender, no hepatosplenomegaly, no distention.  Skin: Intact without lesions or rashes.  Neurologic: Alert and oriented x 3.  Psych: Normal affect. Extremities: No clubbing or cyanosis.   Assessment/Plan: 1. Chest pain: Patient was seen in the ER for this on 10/18.  She had a prior cardiac cath with no significant CAD in 4/11.  She continues to have episodes of chest pressure since her ER visit.  ECG is not suggestive of ischemia.  Given ongoing symptoms, I think that she needs further evaluation.  She has had a false positive Myoview in the past.  I will arrange for coronary CT angiogram.  I will have her take metoprolol 50 mg x 1 2-3 hours before the CT.  2. AVNRT: H/o ablation.  No significant palpitations.   Marca Ancona 08/18/2013

## 2013-08-18 NOTE — Patient Instructions (Addendum)
Your physician has requested that you have cardiac CT. Cardiac computed tomography (CT) is a painless test that uses an x-ray machine to take clear, detailed pictures of your heart. For further information please visit [email or web address] Please follow instruction sheet as given.   Take metoprolol tartrate 50mg  two hours before you have the test done.   Your physician recommends that you schedule a follow-up appointment as needed with Dr Shirlee Latch.

## 2013-08-19 ENCOUNTER — Encounter: Payer: Self-pay | Admitting: *Deleted

## 2013-08-25 ENCOUNTER — Telehealth: Payer: Self-pay | Admitting: Cardiology

## 2013-08-25 NOTE — Telephone Encounter (Signed)
New Problem  Pt has a CT Scheduled for 11/4 and has not received any additional information in regards to this appt// please assist

## 2013-08-25 NOTE — Telephone Encounter (Signed)
Will forward to PCC. 

## 2013-08-26 ENCOUNTER — Ambulatory Visit (HOSPITAL_COMMUNITY)
Admission: RE | Admit: 2013-08-26 | Discharge: 2013-08-26 | Disposition: A | Discharge status: Home / Self Care | Payer: Medicare Other | Source: Ambulatory Visit | Attending: Cardiology | Admitting: Cardiology

## 2013-08-26 ENCOUNTER — Encounter (HOSPITAL_COMMUNITY): Payer: Self-pay

## 2013-08-26 DIAGNOSIS — R079 Chest pain, unspecified: Secondary | ICD-10-CM

## 2013-08-26 DIAGNOSIS — I2510 Atherosclerotic heart disease of native coronary artery without angina pectoris: Secondary | ICD-10-CM | POA: Insufficient documentation

## 2013-08-26 HISTORY — DX: Calculus of kidney: N20.0

## 2013-08-26 MED ORDER — NITROGLYCERIN 0.4 MG SL SUBL
SUBLINGUAL_TABLET | SUBLINGUAL | Status: AC
  Filled 2013-08-26: qty 25

## 2013-08-26 MED ORDER — METOPROLOL TARTRATE 1 MG/ML IV SOLN
INTRAVENOUS | Status: AC
  Administered 2013-08-26: 5 mg
  Filled 2013-08-26: qty 5

## 2013-08-26 MED ORDER — IOHEXOL 350 MG/ML SOLN
80.0000 mL | Freq: Once | INTRAVENOUS | Status: AC | PRN
  Administered 2013-08-26: 80 mL via INTRAVENOUS

## 2013-08-26 MED ORDER — NITROGLYCERIN 0.4 MG SL SUBL
0.4000 mg | SUBLINGUAL_TABLET | SUBLINGUAL | Status: DC | PRN
  Administered 2013-08-26: 0.4 mg via SUBLINGUAL

## 2013-08-26 NOTE — Telephone Encounter (Signed)
Patient is has been giving CT instructions.

## 2013-09-15 ENCOUNTER — Ambulatory Visit (INDEPENDENT_AMBULATORY_CARE_PROVIDER_SITE_OTHER): Payer: Medicare Other | Admitting: Family Medicine

## 2013-09-15 ENCOUNTER — Encounter: Payer: Self-pay | Admitting: Family Medicine

## 2013-09-15 VITALS — BP 115/74 | HR 69 | Wt 199.0 lb

## 2013-09-15 DIAGNOSIS — D509 Iron deficiency anemia, unspecified: Secondary | ICD-10-CM

## 2013-09-15 DIAGNOSIS — Z13220 Encounter for screening for lipoid disorders: Secondary | ICD-10-CM

## 2013-09-15 DIAGNOSIS — R7301 Impaired fasting glucose: Secondary | ICD-10-CM

## 2013-09-15 DIAGNOSIS — E785 Hyperlipidemia, unspecified: Secondary | ICD-10-CM

## 2013-09-15 DIAGNOSIS — R748 Abnormal levels of other serum enzymes: Secondary | ICD-10-CM

## 2013-09-15 LAB — HEPATIC FUNCTION PANEL
ALT: 42 U/L — ABNORMAL HIGH (ref 0–35)
AST: 49 U/L — ABNORMAL HIGH (ref 0–37)
Albumin: 4.3 g/dL (ref 3.5–5.2)
Alkaline Phosphatase: 99 U/L (ref 39–117)
Bilirubin, Direct: 0.1 mg/dL (ref 0.0–0.3)
Indirect Bilirubin: 0.5 mg/dL (ref 0.0–0.9)
Total Bilirubin: 0.6 mg/dL (ref 0.3–1.2)
Total Protein: 6.9 g/dL (ref 6.0–8.3)

## 2013-09-15 LAB — LIPID PANEL
Cholesterol: 140 mg/dL (ref 0–200)
HDL: 38 mg/dL — ABNORMAL LOW (ref >39)
LDL Cholesterol: 81 mg/dL (ref 0–99)
Total CHOL/HDL Ratio: 3.7 Ratio
Triglycerides: 105 mg/dL (ref <150)
VLDL: 21 mg/dL (ref 0–40)

## 2013-09-15 LAB — FERRITIN: Ferritin: 50 ng/mL (ref 10–291)

## 2013-09-15 LAB — POCT GLYCOSYLATED HEMOGLOBIN (HGB A1C): Hemoglobin A1C: 5.7

## 2013-09-15 NOTE — Progress Notes (Signed)
  Subjective:    Patient ID: Theresa Scott, female    DOB: 1956/05/02, 57 y.o.   MRN: [id number]  HPI IFG - no inc thrist or urination. Was diabetic before bypass surgery.   Hyperlipidemia - Was on lipitor at one time. Came off the medication after her bypass surgery.  Still feeling really tired.Was have chest pain and left arm numbness.  Had a cardiac CT scan. No sig CAD but at inc risk based on score.  Needs cholesterol checked.  Was on lipitor at one time. Started on ASA, 81mg .   Mood-currently well controlled. Currently on Pristiq and using amitriptyline at bedtime. Review of Systems     Objective:   Physical Exam  Constitutional: She is oriented to person, place, and time. She appears well-developed and well-nourished.  HENT:  Head: Normocephalic and atraumatic.  Cardiovascular: Normal rate, regular rhythm and normal heart sounds.   Pulmonary/Chest: Effort normal and breath sounds normal.  Abdominal: Soft. Bowel sounds are normal. She exhibits no distension and no mass. There is no tenderness. There is no rebound and no guarding.  Unable to palpate liver edge  Neurological: She is alert and oriented to person, place, and time.  Skin: Skin is warm and dry.  Psychiatric: She has a normal mood and affect. Her behavior is normal.          Assessment & Plan:  IFG - Well controlled. Continue current regimen. Followup in 6 months. Monitor for any lows especially if she continues to lose weight.  Lab Results  Component Value Date   HGBA1C 5.7 09/15/2013    Mood - well controled but still fatigued.  Continue current regimen. Followup in 6 months.  Increased risk of heart disease - Will check lipids.   Elevated liver enzymes- due to recheck liver enzymes today.

## 2013-09-22 NOTE — Telephone Encounter (Signed)
I cannot help her with this, Sue Lush may be a resource.

## 2013-09-22 NOTE — Telephone Encounter (Signed)
New problem    Patient was seen in the office Oct with Dr. Shirlee Latch    A receipt was given to patient however,  patient has misplaced the receipt . Would like a copy .   Patient contact the billing office was told to contact the office.

## 2013-09-24 ENCOUNTER — Telehealth: Payer: Self-pay

## 2013-09-24 NOTE — Telephone Encounter (Signed)
Patient called and left a message on nurse line asking for a return call.   Returned Call: Left message asking patient to call back.  

## 2013-09-24 NOTE — Telephone Encounter (Signed)
Called and ordered pristiq - [phone] (pt ID [id number]) and ordered her pristiq for her. They will ship it to our office. Order number [id number]

## 2013-10-02 ENCOUNTER — Telehealth: Payer: Self-pay | Admitting: *Deleted

## 2013-10-02 NOTE — Telephone Encounter (Signed)
Pristiq was shipped to our office today.Medication was placed up front in the rx drawer and pt was notified

## 2013-11-07 ENCOUNTER — Other Ambulatory Visit: Payer: Self-pay | Admitting: Orthopaedic Surgery

## 2013-11-07 DIAGNOSIS — M545 Low back pain, unspecified: Secondary | ICD-10-CM

## 2013-11-08 ENCOUNTER — Ambulatory Visit
Admission: RE | Admit: 2013-11-08 | Discharge: 2013-11-08 | Disposition: A | Discharge status: Home / Self Care | Payer: Medicare HMO | Source: Ambulatory Visit | Attending: Orthopaedic Surgery | Admitting: Orthopaedic Surgery

## 2013-11-08 DIAGNOSIS — M545 Low back pain, unspecified: Secondary | ICD-10-CM

## 2013-11-10 ENCOUNTER — Other Ambulatory Visit: Payer: Medicare Other

## 2013-11-13 ENCOUNTER — Telehealth: Payer: Self-pay | Admitting: *Deleted

## 2013-11-13 NOTE — Telephone Encounter (Signed)
Pt is asking for a1c results to be faxed to [facility] [phone].[name] [place]

## 2013-11-14 ENCOUNTER — Other Ambulatory Visit: Payer: Medicare Other

## 2014-01-12 ENCOUNTER — Telehealth: Payer: Self-pay | Admitting: *Deleted

## 2014-01-12 NOTE — Telephone Encounter (Signed)
Pt called and wanted refill of her Pristique. I called the number that is in her chart and was informed that she was terminated on 10/22/2013. There was a hold time of 10 mins. Unable to wait for customer rep. Called the patient and informed her that she had been terminated and that she could call to get re-established. Left her the following # [phone] on her VM .[name] [place]

## 2014-01-19 ENCOUNTER — Telehealth: Payer: Self-pay | Admitting: *Deleted

## 2014-01-19 NOTE — Telephone Encounter (Signed)
Forms faxed to [place] for medications.[name] [place]

## 2014-01-23 ENCOUNTER — Other Ambulatory Visit: Payer: Self-pay | Admitting: Family Medicine

## 2014-01-29 ENCOUNTER — Telehealth: Payer: Self-pay | Admitting: *Deleted

## 2014-01-29 NOTE — Telephone Encounter (Signed)
Pt called to find out if the shipment of Pristiq has come in from [employer] I told her that I have not seen it but would call her once it arrives.Teddy Spike'

## 2014-02-05 ENCOUNTER — Telehealth: Payer: Self-pay | Admitting: *Deleted

## 2014-02-05 NOTE — Telephone Encounter (Signed)
Pt indicated that she has been taking 1 tablet every 2 days and has been doing well on this. She wanted to know what Dr. Madilyn Fireman thought about this.Theresa Scott

## 2014-02-05 NOTE — Telephone Encounter (Signed)
lvm informing pt that medication came in today .Theresa Scott

## 2014-02-06 NOTE — Telephone Encounter (Signed)
Pt informed.Theresa Scott  

## 2014-02-06 NOTE — Telephone Encounter (Signed)
Sorry. Its the Pristiq.Teddy Spike

## 2014-02-06 NOTE — Telephone Encounter (Signed)
It would really be better if she took it daily. That way doesn't create big peaks and troughs between drug levels today today. Sometimes will do every other day for weaning someone off but I don't typically recommend this for maintenance therapy.

## 2014-02-06 NOTE — Telephone Encounter (Signed)
Im sorry...which medication are we talking about?

## 2014-02-12 NOTE — Telephone Encounter (Signed)
Was this taken care of by Seth Bake or someone else?

## 2014-02-13 ENCOUNTER — Other Ambulatory Visit: Payer: Self-pay | Admitting: Orthopaedic Surgery

## 2014-02-13 DIAGNOSIS — M549 Dorsalgia, unspecified: Secondary | ICD-10-CM

## 2014-02-18 ENCOUNTER — Ambulatory Visit
Admission: RE | Admit: 2014-02-18 | Discharge: 2014-02-18 | Disposition: A | Discharge status: Home / Self Care | Payer: Medicare HMO | Source: Ambulatory Visit | Attending: Orthopaedic Surgery | Admitting: Orthopaedic Surgery

## 2014-02-18 DIAGNOSIS — M549 Dorsalgia, unspecified: Secondary | ICD-10-CM

## 2014-03-17 ENCOUNTER — Ambulatory Visit (INDEPENDENT_AMBULATORY_CARE_PROVIDER_SITE_OTHER): Payer: Medicare HMO | Admitting: Family Medicine

## 2014-03-17 ENCOUNTER — Encounter: Payer: Self-pay | Admitting: Family Medicine

## 2014-03-17 DIAGNOSIS — F411 Generalized anxiety disorder: Secondary | ICD-10-CM

## 2014-03-17 DIAGNOSIS — F329 Major depressive disorder, single episode, unspecified: Secondary | ICD-10-CM

## 2014-03-17 DIAGNOSIS — F3289 Other specified depressive episodes: Secondary | ICD-10-CM

## 2014-03-17 DIAGNOSIS — R7301 Impaired fasting glucose: Secondary | ICD-10-CM

## 2014-03-17 LAB — POCT GLYCOSYLATED HEMOGLOBIN (HGB A1C): Hemoglobin A1C: 5.7

## 2014-03-17 MED ORDER — AMITRIPTYLINE HCL 25 MG PO TABS: ORAL_TABLET | ORAL | Status: DC

## 2014-03-17 NOTE — Progress Notes (Signed)
   Subjective:    Patient ID: Theresa Scott, female    DOB: 02/17/1956, 58 y.o.   MRN: [id number]  HPI  F/U depression/Anxiety - mood has een good.  Well controlled. Sleep is fair.  No S.E. On the medication.   Has been having back problems.  Has been seeing ortho and getting injection.  Has been having pain shooting into both legs. Finds out tomorrow if she is going to have surgery.  Has already had 4 previous back surgeries. Pain got wrose about 8 months ago.   IFG - some increase hungry on the steroid shots. Has been eating more ice and has been more thirsty.   Review of Systems     Objective:   Physical Exam  Constitutional: She is oriented to person, place, and time. She appears well-developed and well-nourished.  HENT:  Head: Normocephalic and atraumatic.  Cardiovascular: Normal rate, regular rhythm and normal heart sounds.   Pulmonary/Chest: Effort normal and breath sounds normal.  Neurological: She is alert and oriented to person, place, and time.  Skin: Skin is warm and dry.  Psychiatric: She has a normal mood and affect. Her behavior is normal.          Assessment & Plan:  Depression/anxiety- Well controlled. F/U in 6 mo. continue present state. She gets it to record from the manufacturer through a program.  IFG - well controlled. F/U in 6 mo.   Chronic back pain - which her low. She was on that tomorrow she says have surgery or not.

## 2014-04-20 ENCOUNTER — Telehealth: Payer: Self-pay | Admitting: *Deleted

## 2014-04-20 NOTE — Telephone Encounter (Signed)
Called to refill pristiq for pt. Order # [id number].[name] [place]

## 2014-04-23 ENCOUNTER — Telehealth: Payer: Self-pay | Admitting: *Deleted

## 2014-04-23 ENCOUNTER — Ambulatory Visit (INDEPENDENT_AMBULATORY_CARE_PROVIDER_SITE_OTHER): Payer: Medicare Other | Admitting: Surgery

## 2014-04-23 ENCOUNTER — Ambulatory Visit (INDEPENDENT_AMBULATORY_CARE_PROVIDER_SITE_OTHER): Payer: Self-pay | Admitting: Surgery

## 2014-04-23 NOTE — Telephone Encounter (Signed)
Called to inform pt that her medication had come in and she can come p/u at her convenience.[name] [place]

## 2014-06-11 ENCOUNTER — Ambulatory Visit (INDEPENDENT_AMBULATORY_CARE_PROVIDER_SITE_OTHER): Payer: Self-pay | Admitting: Surgery

## 2014-07-17 ENCOUNTER — Ambulatory Visit (INDEPENDENT_AMBULATORY_CARE_PROVIDER_SITE_OTHER): Payer: Self-pay | Admitting: Surgery

## 2014-07-23 ENCOUNTER — Ambulatory Visit (INDEPENDENT_AMBULATORY_CARE_PROVIDER_SITE_OTHER): Payer: Self-pay | Admitting: Surgery

## 2014-07-23 ENCOUNTER — Telehealth: Payer: Self-pay | Admitting: *Deleted

## 2014-07-23 NOTE — Telephone Encounter (Signed)
Called and spoke w/Theresa Scott to reorder pt's pristiq 100 mg her order # [id number].[name] [place]

## 2014-07-29 ENCOUNTER — Telehealth: Payer: Self-pay | Admitting: *Deleted

## 2014-07-29 NOTE — Telephone Encounter (Signed)
Called and informed pt that pristiq is up front for p/u.[name]

## 2014-09-15 ENCOUNTER — Encounter: Payer: Self-pay | Admitting: Family Medicine

## 2014-09-15 ENCOUNTER — Ambulatory Visit (INDEPENDENT_AMBULATORY_CARE_PROVIDER_SITE_OTHER): Payer: Medicare HMO | Admitting: *Deleted

## 2014-09-15 ENCOUNTER — Ambulatory Visit (INDEPENDENT_AMBULATORY_CARE_PROVIDER_SITE_OTHER): Payer: Medicare HMO | Admitting: Family Medicine

## 2014-09-15 VITALS — BP 132/84 | HR 71 | Temp 98.0°F | Ht 67.0 in | Wt 198.0 lb

## 2014-09-15 DIAGNOSIS — R7301 Impaired fasting glucose: Secondary | ICD-10-CM

## 2014-09-15 DIAGNOSIS — Z23 Encounter for immunization: Secondary | ICD-10-CM

## 2014-09-15 DIAGNOSIS — Z87442 Personal history of urinary calculi: Secondary | ICD-10-CM

## 2014-09-15 DIAGNOSIS — F329 Major depressive disorder, single episode, unspecified: Secondary | ICD-10-CM

## 2014-09-15 DIAGNOSIS — F322 Major depressive disorder, single episode, severe without psychotic features: Secondary | ICD-10-CM

## 2014-09-15 DIAGNOSIS — E785 Hyperlipidemia, unspecified: Secondary | ICD-10-CM

## 2014-09-15 LAB — POCT GLYCOSYLATED HEMOGLOBIN (HGB A1C): Hemoglobin A1C: 5.8

## 2014-09-15 NOTE — Progress Notes (Signed)
   Subjective:    Patient ID: Theresa Scott, female    DOB: August 20, 1956, 58 y.o.   MRN: [id number]  HPI IFG/history of diabetes - overall she is doing well. She's not been able to be as physically active because of her back pain and recent surgery. But otherwise has not had any hypoglycemic events or increased thirst or urination.   She's 3 weeks out from having a stimulator implanted in her back. She says she is in a lot of pain today.She feels it has been helping her.  Has been on her ASA for the lat 3 months  Depression - doing well on the pristiq.  Gets medication through the company. Her brother has been in the hospital for the lat 6 weeks with bilat BKAs.  He is on dialysis and Theresa Scott is her younger brother. This has been very stressful.    Lab Results  Component Value Date   CHOL 140 09/15/2013   HDL 38* 09/15/2013   LDLCALC 81 09/15/2013   TRIG 105 09/15/2013   CHOLHDL 3.7 09/15/2013    Review of Systems     Objective:   Physical Exam  Constitutional: She is oriented to person, place, and time. She appears well-developed and well-nourished.  HENT:  Head: Normocephalic and atraumatic.  Neck: Neck supple. No thyromegaly present.  Cardiovascular: Normal rate, regular rhythm and normal heart sounds.   Pulmonary/Chest: Effort normal and breath sounds normal.  Lymphadenopathy:    She has no cervical adenopathy.  Neurological: She is alert and oriented to person, place, and time.  Skin: Skin is warm and dry.  Psychiatric: She has a normal mood and affect. Her behavior is normal.        Assessment & Plan:  IFG - previous  A1c of 5.7. Up just slightly to 5.8 today. Otherwise well controlled. She plans on scheduling her eye exam soon. Continue work on [employer], portion sizes and trying to become more active as she is able and allowed by her surgeon. Follow-up in 6 months. She is due for CMP and fasting lipid panel. We also discussed her aspirin therapy. She is impaired fasting  glucose and with her history of gastric bypass surgery I think she is high risk to take an aspirin. Recommend discontinue for now.  Depression-currently well controlled Pristiq. She's happy with her regimen and does not feel like we need to adjust her dose. Follow-up in 6 months.

## 2014-09-16 LAB — LIPID PANEL
Cholesterol: 136 mg/dL (ref 0–200)
HDL: 42 mg/dL (ref >39)
LDL Cholesterol: 67 mg/dL (ref 0–99)
Total CHOL/HDL Ratio: 3.2 Ratio
Triglycerides: 135 mg/dL (ref <150)
VLDL: 27 mg/dL (ref 0–40)

## 2014-09-16 LAB — COMPLETE METABOLIC PANEL WITH GFR
ALT: 28 U/L (ref 0–35)
AST: 41 U/L — ABNORMAL HIGH (ref 0–37)
Albumin: 4.1 g/dL (ref 3.5–5.2)
Alkaline Phosphatase: 110 U/L (ref 39–117)
BUN: 12 mg/dL (ref 6–23)
CO2: 27 mEq/L (ref 19–32)
Calcium: 9.5 mg/dL (ref 8.4–10.5)
Chloride: 103 mEq/L (ref 96–112)
Creat: 0.59 mg/dL (ref 0.50–1.10)
GFR, Est African American: >89 mL/min
GFR, Est Non African American: >89 mL/min
Glucose, Bld: 81 mg/dL (ref 70–99)
Potassium: 4.2 mEq/L (ref 3.5–5.3)
Sodium: 143 mEq/L (ref 135–145)
Total Bilirubin: 0.6 mg/dL (ref 0.2–1.2)
Total Protein: 7.1 g/dL (ref 6.0–8.3)

## 2014-10-27 ENCOUNTER — Other Ambulatory Visit: Payer: Self-pay

## 2014-10-27 MED ORDER — DESVENLAFAXINE SUCCINATE ER 100 MG PO TB24: 100.0000 mg | ORAL_TABLET | Freq: Every day | ORAL | Status: DC

## 2014-11-03 ENCOUNTER — Other Ambulatory Visit: Payer: Self-pay | Admitting: *Deleted

## 2014-11-03 MED ORDER — DESVENLAFAXINE SUCCINATE ER 100 MG PO TB24: 100.0000 mg | ORAL_TABLET | Freq: Every day | ORAL | Status: DC

## 2014-11-03 MED ORDER — AMITRIPTYLINE HCL 25 MG PO TABS: ORAL_TABLET | ORAL | Status: DC

## 2014-11-03 NOTE — Telephone Encounter (Signed)
Reorder for pristiq. Order # [id number] .Theresa Scott [place]

## 2014-11-03 NOTE — Telephone Encounter (Signed)
Pt informed.Theresa Scott  

## 2014-11-10 ENCOUNTER — Telehealth: Payer: Self-pay | Admitting: Family Medicine

## 2014-11-10 NOTE — Telephone Encounter (Signed)
Patient advised that Pristiq samples were delivered and ready for her to pick up

## 2014-11-19 DIAGNOSIS — M545 Low back pain: Secondary | ICD-10-CM | POA: Diagnosis not present

## 2014-11-19 DIAGNOSIS — M961 Postlaminectomy syndrome, not elsewhere classified: Secondary | ICD-10-CM | POA: Diagnosis not present

## 2014-11-19 DIAGNOSIS — M41125 Adolescent idiopathic scoliosis, thoracolumbar region: Secondary | ICD-10-CM | POA: Diagnosis not present

## 2014-11-24 ENCOUNTER — Telehealth: Payer: Self-pay | Admitting: Emergency Medicine

## 2014-11-24 DIAGNOSIS — M41125 Adolescent idiopathic scoliosis, thoracolumbar region: Secondary | ICD-10-CM

## 2014-11-24 DIAGNOSIS — M961 Postlaminectomy syndrome, not elsewhere classified: Secondary | ICD-10-CM

## 2014-11-24 NOTE — Telephone Encounter (Signed)
Call from [facility]; they need orders to cover office visits and xrays for this patient's post-op follow-up.

## 2015-01-18 ENCOUNTER — Telehealth: Payer: Self-pay | Admitting: Family Medicine

## 2015-01-18 NOTE — Telephone Encounter (Signed)
Patient called clinic to state her heartbeat has been "different" lately. Pt states it will start "racing" then go back to "normal." Pt states last Wednesday (01/13/15) she felt some weight on her chest but that discomfort subsided. Advised Pt we would want to see her in office soon, Pt states her and her husband are traveling home from vacation today and he has an appt tomorrow. Put Pt on schedule for tomorrow. Advised if the chest pain, shortness of breath, or any numbness in her arm occurs to go to the closest ED ASAP. Verbalized understanding. No further questions.

## 2015-01-19 ENCOUNTER — Ambulatory Visit (INDEPENDENT_AMBULATORY_CARE_PROVIDER_SITE_OTHER): Payer: Commercial Managed Care - HMO

## 2015-01-19 ENCOUNTER — Encounter: Payer: Self-pay | Admitting: Family Medicine

## 2015-01-19 ENCOUNTER — Ambulatory Visit (INDEPENDENT_AMBULATORY_CARE_PROVIDER_SITE_OTHER): Payer: Commercial Managed Care - HMO | Admitting: Family Medicine

## 2015-01-19 VITALS — BP 150/90 | HR 73 | Ht 67.0 in | Wt 201.0 lb

## 2015-01-19 DIAGNOSIS — R0602 Shortness of breath: Secondary | ICD-10-CM | POA: Diagnosis not present

## 2015-01-19 DIAGNOSIS — R079 Chest pain, unspecified: Secondary | ICD-10-CM

## 2015-01-19 DIAGNOSIS — Z79899 Other long term (current) drug therapy: Secondary | ICD-10-CM | POA: Diagnosis not present

## 2015-01-19 DIAGNOSIS — R002 Palpitations: Secondary | ICD-10-CM

## 2015-01-19 DIAGNOSIS — R0789 Other chest pain: Secondary | ICD-10-CM

## 2015-01-19 DIAGNOSIS — D649 Anemia, unspecified: Secondary | ICD-10-CM | POA: Diagnosis not present

## 2015-01-19 NOTE — Progress Notes (Signed)
Subjective:    Patient ID: Theresa Scott, female    DOB: 01-24-1956, 59 y.o.   MRN: [id number]  HPI 2 weeks ago she says she woke up out of sleep and had some chest discomfort/pressure and felt short of breath. Says episode lasted 10 -15 min.  Says just sat on edge of bed for awhile.   She admits she has been under a lot of stress recently.Marland Kitchen Her brother does have double amputee and has been having some complications and has been in and out of the hospital. She is also having to help take care of her mother which has put her under a lot of stress. Says occ gets pressure. Can happen at rest or activity. Did have sweats that initialy morning. Has been noticing some hear flutters.  No dysphagia.  Occ has heartburn. No fever, cough or recent URI   Has been more lightheaded lately and dec appetite but no nausea.  Did get new glasses, bifocles, 3 weeks ago.    Review of Systems     BP 150/90 mmHg  Pulse 73  Ht 5\' 7"  (1.702 m)  Wt 201 lb (91.173 kg)  BMI 31.47 kg/m2  SpO2 96%    Allergies  Allergen Reactions  . Bee Venom Anaphylaxis  . Loratadine Itching    REACTION: dizziness  . Nsaids Other (See Comments)    GI intolerance.    . Latex Rash    Past Medical History  Diagnosis Date  . Chronic kidney disease   . Diabetes mellitus   . Hypertension   . GERD (gastroesophageal reflux disease)   . Hypercholesterolemia   . Degenerative disc disease   . Depression   . Complication of anesthesia   . Kidney stones     Past Surgical History  Procedure Laterality Date  . Tonsillectomy  1971  . Cholecystectomy  1976  . Hand surgery  2000    right  . Back surgery  1990,2003,2004,2010  . Cardiac surgery  2002  . Cardiac catheterization  2011    [facility].   . Gastric bypass  08/22/10  . Esophagogastroduodenoscopy N/A 01/27/2013    Procedure: ESOPHAGOGASTRODUODENOSCOPY (EGD) upper endoscopy;  Surgeon: Shann Medal, MD;  Location: [facility];  Service: General;  Laterality: N/A;   . Gastric bypass      History   Social History  . Marital Status: Married    Spouse Name: N/A  . Number of Children: N/A  . Years of Education: N/A   Occupational History  . Not on file.   Social History Main Topics  . Smoking status: Former Smoker -- 0.50 packs/day for 18 years    Quit date: 08/04/1990  . Smokeless tobacco: Never Used  . Alcohol Use: No  . Drug Use: No  . Sexual Activity: Not on file   Other Topics Concern  . Not on file   Social History Narrative    Family History  Problem Relation Age of Onset  . Cirrhosis Sister     secondary medication  . Liver disease Brother   . Kidney failure Brother     Dialysis at 70, born single kidney  . Liver cancer Brother     Outpatient Encounter Prescriptions as of 01/19/2015  Medication Sig  . amitriptyline (ELAVIL) 25 MG tablet TAKE 1 OR 2 TABLET(S) BY MOUTH AT BEDTIME  . CALCIUM PO Take 2 tablets by mouth daily.  . Cyanocobalamin (VITAMIN B-12 PO) Take 1 tablet by mouth every Wednesday.  Marland Kitchen  desvenlafaxine (PRISTIQ) 100 MG 24 hr tablet Take 1 tablet (100 mg total) by mouth daily.  Marland Kitchen EPINEPHrine (EPIPEN) 0.3 mg/0.3 mL DEVI Inject 0.3 mLs (0.3 mg total) into the muscle as needed.  . gabapentin (NEURONTIN) 300 MG capsule Take 300 mg by mouth daily.  Marland Kitchen HYDROcodone-acetaminophen (NORCO) 10-325 MG per tablet Take 1 tablet by mouth every 6 (six) hours as needed (for back pain).  . Multiple Vitamin (MULTIVITAMIN) tablet Take 1 tablet by mouth 2 (two) times daily.        Objective:   Physical Exam  Constitutional: She is oriented to person, place, and time. She appears well-developed and well-nourished.  HENT:  Head: Normocephalic and atraumatic.  Right Ear: External ear normal.  Left Ear: External ear normal.  Nose: Nose normal.  Mouth/Throat: Oropharynx is clear and moist.  TMs and canals are clear.   Eyes: Conjunctivae and EOM are normal. Pupils are equal, round, and reactive to light.  Neck: Neck supple. No  thyromegaly present.  Cardiovascular: Normal rate, regular rhythm and normal heart sounds.   Pulmonary/Chest: Effort normal and breath sounds normal. She has no wheezes.  Abdominal: Soft. Bowel sounds are normal. She exhibits no distension. There is no tenderness. There is no rebound and no guarding.  Lymphadenopathy:    She has no cervical adenopathy.  Neurological: She is alert and oriented to person, place, and time.  Skin: Skin is warm and dry.  Psychiatric: She has a normal mood and affect.          Assessment & Plan:  Atypical chest pain - most likely stress related. She did have a cardiac cath about 5 years ago done by [facility].  EKG today shows rate of 81 bpm, normal sinus rhythm with no acute ST-T wave changes. I will check CK and troponin though I think MI is unlikely. Because of her history of gastric bypass were also check iron magnesium and thyroid levels. She does take her supplements regularly but certainly something like anemia could increase the occurrence of palpitations etc. She does occasionally have some heartburn but feels like this is different

## 2015-01-20 LAB — FERRITIN: Ferritin: 21 ng/mL (ref 10–291)

## 2015-01-20 LAB — MAGNESIUM: Magnesium: 1.9 mg/dL (ref 1.5–2.5)

## 2015-01-20 LAB — COMPLETE METABOLIC PANEL WITH GFR
ALT: 40 U/L — ABNORMAL HIGH (ref 0–35)
AST: 48 U/L — ABNORMAL HIGH (ref 0–37)
Albumin: 4.3 g/dL (ref 3.5–5.2)
Alkaline Phosphatase: 110 U/L (ref 39–117)
BUN: 9 mg/dL (ref 6–23)
CO2: 30 mEq/L (ref 19–32)
Calcium: 9.4 mg/dL (ref 8.4–10.5)
Chloride: 103 mEq/L (ref 96–112)
Creat: 0.62 mg/dL (ref 0.50–1.10)
GFR, Est African American: >89 mL/min
GFR, Est Non African American: >89 mL/min
Glucose, Bld: 88 mg/dL (ref 70–99)
Potassium: 4.3 mEq/L (ref 3.5–5.3)
Sodium: 142 mEq/L (ref 135–145)
Total Bilirubin: 0.6 mg/dL (ref 0.2–1.2)
Total Protein: 7 g/dL (ref 6.0–8.3)

## 2015-01-20 LAB — VITAMIN B12: Vitamin B-12: 1089 pg/mL — ABNORMAL HIGH (ref 211–911)

## 2015-01-20 LAB — CBC
HCT: 42.7 % (ref 36.0–46.0)
Hemoglobin: 13.9 g/dL (ref 12.0–15.0)
MCH: 29.2 pg (ref 26.0–34.0)
MCHC: 32.6 g/dL (ref 30.0–36.0)
MCV: 89.7 fL (ref 78.0–100.0)
MPV: 12.2 fL (ref 8.6–12.4)
Platelets: 220 10*3/uL (ref 150–400)
RBC: 4.76 MIL/uL (ref 3.87–5.11)
RDW: 13.1 % (ref 11.5–15.5)
WBC: 6.7 10*3/uL (ref 4.0–10.5)

## 2015-01-20 LAB — TROPONIN I: Troponin I: <0.01 ng/mL (ref <0.06)

## 2015-01-20 LAB — PHOSPHORUS: Phosphorus: 3.7 mg/dL (ref 2.3–4.6)

## 2015-01-20 LAB — TSH: TSH: 0.897 u[IU]/mL (ref 0.350–4.500)

## 2015-01-20 LAB — FOLATE: Folate: >20 ng/mL

## 2015-01-20 LAB — CK: Total CK: 94 U/L (ref 7–177)

## 2015-02-09 DIAGNOSIS — Z79899 Other long term (current) drug therapy: Secondary | ICD-10-CM | POA: Diagnosis not present

## 2015-02-09 DIAGNOSIS — Z79891 Long term (current) use of opiate analgesic: Secondary | ICD-10-CM | POA: Diagnosis not present

## 2015-02-09 DIAGNOSIS — M961 Postlaminectomy syndrome, not elsewhere classified: Secondary | ICD-10-CM | POA: Diagnosis not present

## 2015-02-09 DIAGNOSIS — G894 Chronic pain syndrome: Secondary | ICD-10-CM | POA: Diagnosis not present

## 2015-02-16 ENCOUNTER — Ambulatory Visit (INDEPENDENT_AMBULATORY_CARE_PROVIDER_SITE_OTHER): Payer: Commercial Managed Care - HMO | Admitting: Family Medicine

## 2015-02-16 ENCOUNTER — Encounter: Payer: Self-pay | Admitting: Family Medicine

## 2015-02-16 VITALS — BP 140/85 | HR 70 | Ht 67.0 in | Wt 204.0 lb

## 2015-02-16 DIAGNOSIS — R002 Palpitations: Secondary | ICD-10-CM | POA: Diagnosis not present

## 2015-02-16 DIAGNOSIS — IMO0001 Reserved for inherently not codable concepts without codable children: Secondary | ICD-10-CM

## 2015-02-16 DIAGNOSIS — R030 Elevated blood-pressure reading, without diagnosis of hypertension: Secondary | ICD-10-CM | POA: Diagnosis not present

## 2015-02-16 DIAGNOSIS — D509 Iron deficiency anemia, unspecified: Secondary | ICD-10-CM

## 2015-02-16 NOTE — Progress Notes (Signed)
   Subjective:    Patient ID: Theresa Scott, female    DOB: [birth year], 60 y.o.   MRN: [id number]  HPI  Still having palpitations daily. She is not having any more chest pain. Says they lasted about 30 sec.  But she also nervous. No known triggers like sleep or eating.  No OTC meds with decongestants. Only drinks decaf by pass.  Denies feeling lightheaded or dizzy. Says does feel a little off when it happens. No SOB.  She is worried bc she has AVNRT years ago that required ablation. Thought this doesn't feel nearly as intense.    Iron def - she is back on her iron BID. tolerating it well without constipation. Hx of gastric bypass.     blood pressure has been elevated recently. The she said she went to her dentist about a week ago and her blood pressure looked great there. She denies taking any decongestants and avoids all caffeine.  Review of Systems     Objective:   Physical Exam  Constitutional: She is oriented to person, place, and time. She appears well-developed and well-nourished.  HENT:  Head: Normocephalic and atraumatic.  Cardiovascular: Normal rate, regular rhythm and normal heart sounds.   Pulmonary/Chest: Effort normal and breath sounds normal.  Neurological: She is alert and oriented to person, place, and time.  Skin: Skin is warm and dry.  Psychiatric: She has a normal mood and affect. Her behavior is normal.          Assessment & Plan:  Palpitations - we did find that she had iron deficiency anemia and we are working on replacing that. She still having persistent palpitations on a daily basis, typically multiple times a day. We'll go ahead and place order for 72 hour heart monitor.  Iron def anemia - Will recheck levels at f/u next month.   Elevated BP - blood pressure was elevated today at last office visit. Encouraged her to continue to work on diet exercise and will recheck again in one month. Reminded her about low-salt diet as well. If still elevated at that time  we will need to restart a blood pressure medication.  Will call to get recent eye exam with Dr. Rocky Link

## 2015-02-16 NOTE — Addendum Note (Signed)
Addended by: Teddy Spike on: 02/16/2015 04:43 PM   Modules accepted: Orders

## 2015-02-18 ENCOUNTER — Telehealth: Payer: Self-pay | Admitting: *Deleted

## 2015-02-18 ENCOUNTER — Other Ambulatory Visit: Payer: Self-pay | Admitting: *Deleted

## 2015-02-18 MED ORDER — DESVENLAFAXINE SUCCINATE ER 100 MG PO TB24: 100.0000 mg | ORAL_TABLET | Freq: Every day | ORAL | Status: DC

## 2015-02-18 NOTE — Telephone Encounter (Signed)
Pt's forms mailed to [employer] [address] [place] [phone].Theresa Scott [place]

## 2015-02-18 NOTE — Telephone Encounter (Signed)
Forms faxed, copied, confirmation received.Theresa Scott [place]

## 2015-02-23 ENCOUNTER — Encounter (INDEPENDENT_AMBULATORY_CARE_PROVIDER_SITE_OTHER): Payer: Commercial Managed Care - HMO

## 2015-02-23 ENCOUNTER — Encounter: Payer: Self-pay | Admitting: *Deleted

## 2015-02-23 DIAGNOSIS — R002 Palpitations: Secondary | ICD-10-CM | POA: Diagnosis not present

## 2015-02-23 NOTE — Progress Notes (Signed)
Patient ID: Theresa Scott, female   DOB: 1956/07/07, 59 y.o.   MRN: [id number] [employer] 48 hour holter monitor applied to patient.

## 2015-03-05 ENCOUNTER — Telehealth: Payer: Self-pay | Admitting: Family Medicine

## 2015-03-05 NOTE — Telephone Encounter (Signed)
Pt called. She missed your call and wants you to try calling her back at 3:00. Thank you.

## 2015-03-09 MED ORDER — METOPROLOL SUCCINATE ER 25 MG PO TB24: 25.0000 mg | ORAL_TABLET | Freq: Every day | ORAL | Status: DC

## 2015-03-09 NOTE — Telephone Encounter (Signed)
Notes Recorded by Hali Marry, MD on 03/05/2015 at 12:49 PM Call patient: Holter monitor showed just some occasional early beats that were from the atria and occasionally from the ventricles. Nothing worrisome or concerning. This most likely explains the palpitations or flutters that she is experiencing. Most of the time we use a blood pressure pill called a beta blocker to slow the heart rate down just a little bit. This seems to help suppress these early beats and keep them under good control. If she is open to starting a medication like that and please let me know. Then have her follow-up in 6 weeks.     Called pt and advised her to rtn call if she is ok with starting a beta blocker .[name] [place]

## 2015-03-09 NOTE — Telephone Encounter (Signed)
Pt informed and would like to start on beta blocker. Will fwd to pcp for f/u.[name] [place]

## 2015-03-09 NOTE — Telephone Encounter (Signed)
Script sent to [facility].

## 2015-03-16 ENCOUNTER — Ambulatory Visit: Payer: Medicare HMO | Admitting: Family Medicine

## 2015-03-30 DIAGNOSIS — M961 Postlaminectomy syndrome, not elsewhere classified: Secondary | ICD-10-CM | POA: Diagnosis not present

## 2015-03-30 DIAGNOSIS — M545 Low back pain: Secondary | ICD-10-CM | POA: Diagnosis not present

## 2015-03-30 DIAGNOSIS — G894 Chronic pain syndrome: Secondary | ICD-10-CM | POA: Diagnosis not present

## 2015-04-06 ENCOUNTER — Encounter (HOSPITAL_COMMUNITY): Payer: Self-pay | Admitting: Emergency Medicine

## 2015-04-06 ENCOUNTER — Emergency Department (HOSPITAL_COMMUNITY)
Admission: EM | Admit: 2015-04-06 | Discharge: 2015-04-06 | Disposition: A | Discharge status: Home / Self Care | Payer: Commercial Managed Care - HMO | Attending: Emergency Medicine | Admitting: Emergency Medicine

## 2015-04-06 DIAGNOSIS — Z91040 Latex allergy status: Secondary | ICD-10-CM | POA: Insufficient documentation

## 2015-04-06 DIAGNOSIS — N189 Chronic kidney disease, unspecified: Secondary | ICD-10-CM | POA: Insufficient documentation

## 2015-04-06 DIAGNOSIS — J209 Acute bronchitis, unspecified: Secondary | ICD-10-CM | POA: Diagnosis not present

## 2015-04-06 DIAGNOSIS — I129 Hypertensive chronic kidney disease with stage 1 through stage 4 chronic kidney disease, or unspecified chronic kidney disease: Secondary | ICD-10-CM | POA: Insufficient documentation

## 2015-04-06 DIAGNOSIS — Z87891 Personal history of nicotine dependence: Secondary | ICD-10-CM | POA: Diagnosis not present

## 2015-04-06 DIAGNOSIS — Z87442 Personal history of urinary calculi: Secondary | ICD-10-CM | POA: Insufficient documentation

## 2015-04-06 DIAGNOSIS — K219 Gastro-esophageal reflux disease without esophagitis: Secondary | ICD-10-CM | POA: Diagnosis not present

## 2015-04-06 DIAGNOSIS — Z8739 Personal history of other diseases of the musculoskeletal system and connective tissue: Secondary | ICD-10-CM | POA: Insufficient documentation

## 2015-04-06 DIAGNOSIS — F329 Major depressive disorder, single episode, unspecified: Secondary | ICD-10-CM | POA: Diagnosis not present

## 2015-04-06 DIAGNOSIS — R05 Cough: Secondary | ICD-10-CM | POA: Diagnosis present

## 2015-04-06 DIAGNOSIS — Z79899 Other long term (current) drug therapy: Secondary | ICD-10-CM | POA: Diagnosis not present

## 2015-04-06 DIAGNOSIS — J40 Bronchitis, not specified as acute or chronic: Secondary | ICD-10-CM

## 2015-04-06 DIAGNOSIS — E119 Type 2 diabetes mellitus without complications: Secondary | ICD-10-CM | POA: Insufficient documentation

## 2015-04-06 MED ORDER — AMOXICILLIN 500 MG PO CAPS: 1000.0000 mg | ORAL_CAPSULE | Freq: Two times a day (BID) | ORAL | Status: DC

## 2015-04-06 MED ORDER — BENZONATATE 100 MG PO CAPS: 100.0000 mg | ORAL_CAPSULE | Freq: Three times a day (TID) | ORAL | Status: DC

## 2015-04-06 MED ORDER — ALBUTEROL SULFATE HFA 108 (90 BASE) MCG/ACT IN AERS
2.0000 | INHALATION_SPRAY | RESPIRATORY_TRACT | Status: DC | PRN
  Administered 2015-04-06: 2 via RESPIRATORY_TRACT
  Filled 2015-04-06: qty 6.7

## 2015-04-06 MED ORDER — AMOXICILLIN 500 MG PO CAPS
1000.0000 mg | ORAL_CAPSULE | Freq: Once | ORAL | Status: AC
  Administered 2015-04-06: 1000 mg via ORAL
  Filled 2015-04-06: qty 2

## 2015-04-06 NOTE — ED Provider Notes (Signed)
CSN: [id number]     Arrival date & time 04/06/15  1958 History  This chart was scribed for non-physician practitioner Charlann Lange, PA-C, working with Dorie Rank, MD, by Eustaquio Maize, ED Scribe. This patient was seen in room WTR7/WTR7 and the patient's care was started at 8:31 PM.  Chief Complaint  Patient presents with  . Nasal Congestion  . Cough   The history is provided by the patient. No language interpreter was used.    HPI Comments: Theresa Scott is a 59 y.o. female with hx CKD, DM, and HTN who presents to the Emergency Department complaining of productive cough with brown phlegm and nasal congestion x 1 week..She notes having a knot to the back of her head that has since resolved on its own. Pt has taken OTC sinus medication without relief. She has been eating and drinking normally. Denies fever, hemoptysis, or any other associated symptoms.   Past Medical History  Diagnosis Date  . Chronic kidney disease   . Diabetes mellitus   . Hypertension   . GERD (gastroesophageal reflux disease)   . Hypercholesterolemia   . Degenerative disc disease   . Depression   . Complication of anesthesia   . Kidney stones    Past Surgical History  Procedure Laterality Date  . Tonsillectomy  1971  . Cholecystectomy  1976  . Hand surgery  2000    right  . Back surgery  1990,2003,2004,2010  . Cardiac surgery  2002  . Cardiac catheterization  2011     [facility].   . Gastric bypass  08/22/10  . Esophagogastroduodenoscopy N/A 01/27/2013    Procedure: ESOPHAGOGASTRODUODENOSCOPY (EGD) upper endoscopy;  Surgeon: Shann Medal, MD;  Location: [facility];  Service: General;  Laterality: N/A;  . Gastric bypass     Family History  Problem Relation Age of Onset  . Cirrhosis Sister     secondary medication  . Liver disease Brother   . Kidney failure Brother     Dialysis at 4, born single kidney  . Liver cancer Brother    History  Substance Use Topics  . Smoking status: Former Smoker --  0.50 packs/day for 18 years    Quit date: 08/04/1990  . Smokeless tobacco: Never Used  . Alcohol Use: No   OB History    No data available     Review of Systems  Constitutional: Positive for diaphoresis. Negative for fever.  HENT: Positive for congestion.   Respiratory: Positive for cough.       Allergies  Bee venom; Loratadine; Nsaids; and Latex  Home Medications   Prior to Admission medications   Medication Sig Start Date End Date Taking? Authorizing Provider  amitriptyline (ELAVIL) 25 MG tablet TAKE 1 OR 2 TABLET(S) BY MOUTH AT BEDTIME 11/03/14   Hali Marry, MD  CALCIUM PO Take 2 tablets by mouth daily.    Historical Provider, MD  Cyanocobalamin (VITAMIN B-12 PO) Take 1 tablet by mouth every Wednesday.    Historical Provider, MD  desvenlafaxine (PRISTIQ) 100 MG 24 hr tablet Take 1 tablet (100 mg total) by mouth daily. 02/18/15   Hali Marry, MD  EPINEPHrine (EPIPEN) 0.3 mg/0.3 mL DEVI Inject 0.3 mLs (0.3 mg total) into the muscle as needed. 06/22/12   Hazel Sams, PA-C  gabapentin (NEURONTIN) 300 MG capsule Take 300 mg by mouth daily.    Historical Provider, MD  HYDROcodone-acetaminophen (NORCO) 10-325 MG per tablet Take 1 tablet by mouth every 6 (six) hours as needed (for  back pain).    Historical Provider, MD  metoprolol succinate (TOPROL-XL) 25 MG 24 hr tablet Take 1 tablet (25 mg total) by mouth daily. 03/09/15   Hali Marry, MD  Multiple Vitamin (MULTIVITAMIN) tablet Take 1 tablet by mouth 2 (two) times daily.     Historical Provider, MD   Triage Vitals: BP 147/90 mmHg  Pulse 75  Temp(Src) 98.7 F (37.1 C) (Oral)  Resp 20  SpO2 99%   Physical Exam  Constitutional: She is oriented to person, place, and time. She appears well-developed and well-nourished. No distress.  HENT:  Head: Normocephalic and atraumatic.  Right Ear: Tympanic membrane normal.  Left Ear: Tympanic membrane normal.  Nose: Nose normal.  Mouth/Throat: Oropharynx is clear  and moist.  Eyes: Conjunctivae and EOM are normal.  Neck: Neck supple. No tracheal deviation present.  Cardiovascular: Normal rate, regular rhythm and normal heart sounds.   Pulmonary/Chest: Effort normal. No respiratory distress. She exhibits no tenderness.  Actively coughing.  Abdominal: Soft. There is no tenderness.  Musculoskeletal: Normal range of motion.  Neurological: She is alert and oriented to person, place, and time.  Skin: Skin is warm and dry.  Psychiatric: She has a normal mood and affect. Her behavior is normal.  Nursing note and vitals reviewed.   ED Course  Procedures (including critical care time)  DIAGNOSTIC STUDIES: Oxygen Saturation is 99% on RA, normal by my interpretation.    COORDINATION OF CARE: 8:35 PM-Discussed treatment plan which includes RX amoxicillin and albuterol inhaler with pt at bedside and pt agreed to plan.   Labs Review Labs Reviewed - No data to display  Imaging Review No results found.   EKG Interpretation None      MDM   Final diagnoses:  None   1. Bronchitis  Will treat with antibiotics based on duration of symptoms. She is well appearing and stable for discharge home/outpatient treatment.  I personally performed the services described in this documentation, which was scribed in my presence. The recorded information has been reviewed and is accurate.       Charlann Lange, PA-C 04/06/15 [place], MD 04/08/15 [phone]

## 2015-04-06 NOTE — Discharge Instructions (Signed)
How to Use an Inhaler Proper inhaler technique is very important. Good technique ensures that the medicine reaches the lungs. Poor technique results in depositing the medicine on the tongue and back of the throat rather than in the airways. If you do not use the inhaler with good technique, the medicine will not help you. STEPS TO FOLLOW IF USING AN INHALER WITHOUT AN EXTENSION TUBE  Remove the cap from the inhaler.  If you are using the inhaler for the first time, you will need to prime it. Shake the inhaler for 5 seconds and release four puffs into the air, away from your face. Ask your health care provider or pharmacist if you have questions about priming your inhaler.  Shake the inhaler for 5 seconds before each breath in (inhalation).  Position the inhaler so that the top of the canister faces up.  Put your index finger on the top of the medicine canister. Your thumb supports the bottom of the inhaler.  Open your mouth.  Either place the inhaler between your teeth and place your lips tightly around the mouthpiece, or hold the inhaler 1-2 inches away from your open mouth. If you are unsure of which technique to use, ask your health care provider.  Breathe out (exhale) normally and as completely as possible.  Press the canister down with your index finger to release the medicine.  At the same time as the canister is pressed, inhale deeply and slowly until your lungs are completely filled. This should take 4-6 seconds. Keep your tongue down.  Hold the medicine in your lungs for 5-10 seconds (10 seconds is best). This helps the medicine get into the small airways of your lungs.  Breathe out slowly, through pursed lips. Whistling is an example of pursed lips.  Wait at least 15-30 seconds between puffs. Continue with the above steps until you have taken the number of puffs your health care provider has ordered. Do not use the inhaler more than your health care provider tells  you.  Replace the cap on the inhaler.  Follow the directions from your health care provider or the inhaler insert for cleaning the inhaler. STEPS TO FOLLOW IF USING AN INHALER WITH AN EXTENSION (SPACER)  Remove the cap from the inhaler.  If you are using the inhaler for the first time, you will need to prime it. Shake the inhaler for 5 seconds and release four puffs into the air, away from your face. Ask your health care provider or pharmacist if you have questions about priming your inhaler.  Shake the inhaler for 5 seconds before each breath in (inhalation).  Place the open end of the spacer onto the mouthpiece of the inhaler.  Position the inhaler so that the top of the canister faces up and the spacer mouthpiece faces you.  Put your index finger on the top of the medicine canister. Your thumb supports the bottom of the inhaler and the spacer.  Breathe out (exhale) normally and as completely as possible.  Immediately after exhaling, place the spacer between your teeth and into your mouth. Close your lips tightly around the spacer.  Press the canister down with your index finger to release the medicine.  At the same time as the canister is pressed, inhale deeply and slowly until your lungs are completely filled. This should take 4-6 seconds. Keep your tongue down and out of the way.  Hold the medicine in your lungs for 5-10 seconds (10 seconds is best). This helps the  medicine get into the small airways of your lungs. Exhale.  Repeat inhaling deeply through the spacer mouthpiece. Again hold that breath for up to 10 seconds (10 seconds is best). Exhale slowly. If it is difficult to take this second deep breath through the spacer, breathe normally several times through the spacer. Remove the spacer from your mouth.  Wait at least 15-30 seconds between puffs. Continue with the above steps until you have taken the number of puffs your health care provider has ordered. Do not use the  inhaler more than your health care provider tells you.  Remove the spacer from the inhaler, and place the cap on the inhaler.  Follow the directions from your health care provider or the inhaler insert for cleaning the inhaler and spacer. If you are using different kinds of inhalers, use your quick relief medicine to open the airways 10-15 minutes before using a steroid if instructed to do so by your health care provider. If you are unsure which inhalers to use and the order of using them, ask your health care provider, nurse, or respiratory therapist. If you are using a steroid inhaler, always rinse your mouth with water after your last puff, then gargle and spit out the water. Do not swallow the water. AVOID:  Inhaling before or after starting the spray of medicine. It takes practice to coordinate your breathing with triggering the spray.  Inhaling through the nose (rather than the mouth) when triggering the spray. HOW TO DETERMINE IF YOUR INHALER IS FULL OR NEARLY EMPTY You cannot know when an inhaler is empty by shaking it. A few inhalers are now being made with dose counters. Ask your health care provider for a prescription that has a dose counter if you feel you need that extra help. If your inhaler does not have a counter, ask your health care provider to help you determine the date you need to refill your inhaler. Write the refill date on a calendar or your inhaler canister. Refill your inhaler 7-10 days before it runs out. Be sure to keep an adequate supply of medicine. This includes making sure it is not expired, and that you have a spare inhaler.  SEEK MEDICAL CARE IF:   Your symptoms are only partially relieved with your inhaler.  You are having trouble using your inhaler.  You have some increase in phlegm. SEEK IMMEDIATE MEDICAL CARE IF:   You feel little or no relief with your inhalers. You are still wheezing and are feeling shortness of breath or tightness in your chest or  both.  You have dizziness, headaches, or a fast heart rate.  You have chills, fever, or night sweats.  You have a noticeable increase in phlegm production, or there is blood in the phlegm. MAKE SURE YOU:   Understand these instructions.  Will watch your condition.  Will get help right away if you are not doing well or get worse. Document Released: 10/06/2000 Document Revised: 07/30/2013 Document Reviewed: 05/08/2013 [facility] Patient Information 2015 [place], [place]. This information is not intended to replace advice given to you by your health care provider. Make sure you discuss any questions you have with your health care provider. Acute Bronchitis Bronchitis is inflammation of the airways that extend from the windpipe into the lungs (bronchi). The inflammation often causes mucus to develop. This leads to a cough, which is the most common symptom of bronchitis.  In acute bronchitis, the condition usually develops suddenly and goes away over time, usually in a  couple weeks. Smoking, allergies, and asthma can make bronchitis worse. Repeated episodes of bronchitis may cause further lung problems.  CAUSES Acute bronchitis is most often caused by the same virus that causes a cold. The virus can spread from person to person (contagious) through coughing, sneezing, and touching contaminated objects. SIGNS AND SYMPTOMS   Cough.   Fever.   Coughing up mucus.   Body aches.   Chest congestion.   Chills.   Shortness of breath.   Sore throat.  DIAGNOSIS  Acute bronchitis is usually diagnosed through a physical exam. Your health care provider will also ask you questions about your medical history. Tests, such as chest X-rays, are sometimes done to rule out other conditions.  TREATMENT  Acute bronchitis usually goes away in a couple weeks. Oftentimes, no medical treatment is necessary. Medicines are sometimes given for relief of fever or cough. Antibiotic medicines are usually not  needed but may be prescribed in certain situations. In some cases, an inhaler may be recommended to help reduce shortness of breath and control the cough. A cool mist vaporizer may also be used to help thin bronchial secretions and make it easier to clear the chest.  HOME CARE INSTRUCTIONS  Get plenty of rest.   Drink enough fluids to keep your urine clear or pale yellow (unless you have a medical condition that requires fluid restriction). Increasing fluids may help thin your respiratory secretions (sputum) and reduce chest congestion, and it will prevent dehydration.   Take medicines only as directed by your health care provider.  If you were prescribed an antibiotic medicine, finish it all even if you start to feel better.  Avoid smoking and secondhand smoke. Exposure to cigarette smoke or irritating chemicals will make bronchitis worse. If you are a smoker, consider using nicotine gum or skin patches to help control withdrawal symptoms. Quitting smoking will help your lungs heal faster.   Reduce the chances of another bout of acute bronchitis by washing your hands frequently, avoiding people with cold symptoms, and trying not to touch your hands to your mouth, nose, or eyes.   Keep all follow-up visits as directed by your health care provider.  SEEK MEDICAL CARE IF: Your symptoms do not improve after 1 week of treatment.  SEEK IMMEDIATE MEDICAL CARE IF:  You develop an increased fever or chills.   You have chest pain.   You have severe shortness of breath.  You have bloody sputum.   You develop dehydration.  You faint or repeatedly feel like you are going to pass out.  You develop repeated vomiting.  You develop a severe headache. MAKE SURE YOU:   Understand these instructions.  Will watch your condition.  Will get help right away if you are not doing well or get worse. Document Released: 11/16/2004 Document Revised: 02/23/2014 Document Reviewed:  04/01/2013 [facility] Patient Information 2015 [place], [place]. This information is not intended to replace advice given to you by your health care provider. Make sure you discuss any questions you have with your health care provider.

## 2015-04-06 NOTE — ED Notes (Signed)
Pt c/o cough, head and nasal congestion x1 week. Denies pain.

## 2015-04-20 ENCOUNTER — Ambulatory Visit: Payer: Commercial Managed Care - HMO | Admitting: Family Medicine

## 2015-04-28 ENCOUNTER — Ambulatory Visit: Payer: Commercial Managed Care - HMO | Admitting: Family Medicine

## 2015-05-03 ENCOUNTER — Other Ambulatory Visit: Payer: Self-pay | Admitting: Family Medicine

## 2015-05-05 ENCOUNTER — Encounter: Payer: Self-pay | Admitting: Family Medicine

## 2015-05-05 ENCOUNTER — Ambulatory Visit (INDEPENDENT_AMBULATORY_CARE_PROVIDER_SITE_OTHER): Payer: Commercial Managed Care - HMO | Admitting: Family Medicine

## 2015-05-05 ENCOUNTER — Ambulatory Visit (INDEPENDENT_AMBULATORY_CARE_PROVIDER_SITE_OTHER): Payer: Commercial Managed Care - HMO

## 2015-05-05 VITALS — BP 133/81 | HR 58 | Temp 98.2°F | Ht 67.0 in | Wt 207.0 lb

## 2015-05-05 DIAGNOSIS — R7301 Impaired fasting glucose: Secondary | ICD-10-CM | POA: Diagnosis not present

## 2015-05-05 DIAGNOSIS — F322 Major depressive disorder, single episode, severe without psychotic features: Secondary | ICD-10-CM | POA: Diagnosis not present

## 2015-05-05 DIAGNOSIS — R079 Chest pain, unspecified: Secondary | ICD-10-CM | POA: Diagnosis not present

## 2015-05-05 DIAGNOSIS — J9811 Atelectasis: Secondary | ICD-10-CM

## 2015-05-05 DIAGNOSIS — R799 Abnormal finding of blood chemistry, unspecified: Secondary | ICD-10-CM

## 2015-05-05 DIAGNOSIS — R790 Abnormal level of blood mineral: Secondary | ICD-10-CM

## 2015-05-05 DIAGNOSIS — F329 Major depressive disorder, single episode, unspecified: Secondary | ICD-10-CM

## 2015-05-05 LAB — CBC WITH DIFFERENTIAL/PLATELET
Basophils Absolute: 0 10*3/uL (ref 0.0–0.1)
Basophils Relative: 0 % (ref 0–1)
Eosinophils Absolute: 0.1 10*3/uL (ref 0.0–0.7)
Eosinophils Relative: 2 % (ref 0–5)
HCT: 40.2 % (ref 36.0–46.0)
Hemoglobin: 13.7 g/dL (ref 12.0–15.0)
Lymphocytes Relative: 31 % (ref 12–46)
Lymphs Abs: 1.8 10*3/uL (ref 0.7–4.0)
MCH: 31.1 pg (ref 26.0–34.0)
MCHC: 34.1 g/dL (ref 30.0–36.0)
MCV: 91.4 fL (ref 78.0–100.0)
MPV: 11.1 fL (ref 8.6–12.4)
Monocytes Absolute: 0.5 10*3/uL (ref 0.1–1.0)
Monocytes Relative: 9 % (ref 3–12)
Neutro Abs: 3.3 10*3/uL (ref 1.7–7.7)
Neutrophils Relative %: 58 % (ref 43–77)
Platelets: 180 10*3/uL (ref 150–400)
RBC: 4.4 MIL/uL (ref 3.87–5.11)
RDW: 13.7 % (ref 11.5–15.5)
WBC: 5.7 10*3/uL (ref 4.0–10.5)

## 2015-05-05 LAB — POCT GLYCOSYLATED HEMOGLOBIN (HGB A1C): Hemoglobin A1C: 6.1

## 2015-05-05 LAB — CK: Total CK: 68 U/L (ref 7–177)

## 2015-05-05 LAB — FERRITIN: Ferritin: 79 ng/mL (ref 10–291)

## 2015-05-05 NOTE — Progress Notes (Signed)
Subjective:    Patient ID: Theresa Scott, female    DOB: [birth year], 59 y.o.   MRN: [id number]  HPI IFG - No inc thrist or urination. Doing well with her diet. Urine has been darker.  No dysuris.    Depression - currently on Pristiq.  Sometimes feels more down than others.  Still feels really tired.     Feels like hsa been more swollen than usual.in her legs and ankles.  Feels like a pressure on her the left upper chest and inbetween the shoulder and neck.  Started a couple of weeks ago.  Comes and goes.   Today it feels heavy and like it is hard to take a deep breath. Still feels really fatigue.   She denies any injury to her neck or shoulders and moving her neck and shoulders does not irritate her exacerbate her pain. She's not been taking anything for the pain.  Review of Systems  BP 133/81 mmHg  Pulse 58  Temp(Src) 98.2 F (36.8 C)  Ht 5\' 7"  (1.702 m)  Wt 207 lb (93.895 kg)  BMI 32.41 kg/m2  SpO2 96%    Allergies  Allergen Reactions  . Bee Venom Anaphylaxis  . Loratadine Itching    REACTION: dizziness  . Nsaids Other (See Comments)    GI intolerance.    . Latex Rash    Past Medical History  Diagnosis Date  . Chronic kidney disease   . Diabetes mellitus   . Hypertension   . GERD (gastroesophageal reflux disease)   . Hypercholesterolemia   . Degenerative disc disease   . Depression   . Complication of anesthesia   . Kidney stones     Past Surgical History  Procedure Laterality Date  . Tonsillectomy  1971  . Cholecystectomy  1976  . Hand surgery  2000    right  . Back surgery  1990,2003,2004,2010  . Cardiac surgery  2002  . Cardiac catheterization  2011    [facility].   . Gastric bypass  08/22/10  . Esophagogastroduodenoscopy N/A 01/27/2013    Procedure: ESOPHAGOGASTRODUODENOSCOPY (EGD) upper endoscopy;  Surgeon: Shann Medal, MD;  Location: [facility];  Service: General;  Laterality: N/A;  . Gastric bypass      History   Social History  .  Marital Status: Married    Spouse Name: N/A  . Number of Children: N/A  . Years of Education: N/A   Occupational History  . Not on file.   Social History Main Topics  . Smoking status: Former Smoker -- 0.50 packs/day for 18 years    Quit date: 08/04/1990  . Smokeless tobacco: Never Used  . Alcohol Use: No  . Drug Use: No  . Sexual Activity: Not on file   Other Topics Concern  . Not on file   Social History Narrative    Family History  Problem Relation Age of Onset  . Cirrhosis Sister     secondary medication  . Liver disease Brother   . Kidney failure Brother     Dialysis at 37, born single kidney  . Liver cancer Brother     Outpatient Encounter Prescriptions as of 05/05/2015  Medication Sig  . amitriptyline (ELAVIL) 25 MG tablet TAKE 1 OR 2 TABLET(S) BY MOUTH AT BEDTIME  . CALCIUM PO Take 2 tablets by mouth daily.  . Cyanocobalamin (VITAMIN B-12 PO) Take 1 tablet by mouth every Wednesday.  Marland Kitchen desvenlafaxine (PRISTIQ) 100 MG 24 hr tablet Take 1 tablet (100 mg  total) by mouth daily.  Marland Kitchen EPINEPHrine (EPIPEN) 0.3 mg/0.3 mL DEVI Inject 0.3 mLs (0.3 mg total) into the muscle as needed.  . gabapentin (NEURONTIN) 300 MG capsule Take 300 mg by mouth daily.  Marland Kitchen HYDROcodone-acetaminophen (NORCO) 10-325 MG per tablet Take 1 tablet by mouth every 6 (six) hours as needed (for back pain).  . metoprolol succinate (TOPROL-XL) 25 MG 24 hr tablet TAKE ONE TABLET BY MOUTH DAILY  . Multiple Vitamin (MULTIVITAMIN) tablet Take 1 tablet by mouth 2 (two) times daily.   . [DISCONTINUED] amoxicillin (AMOXIL) 500 MG capsule Take 2 capsules (1,000 mg total) by mouth 2 (two) times daily.  . [DISCONTINUED] benzonatate (TESSALON) 100 MG capsule Take 1 capsule (100 mg total) by mouth every 8 (eight) hours.   No facility-administered encounter medications on file as of 05/05/2015.          Objective:   Physical Exam  Constitutional: She is oriented to person, place, and time. She appears  well-developed and well-nourished.  HENT:  Head: Normocephalic and atraumatic.  Cardiovascular: Normal rate, regular rhythm and normal heart sounds.   Pulmonary/Chest: Effort normal and breath sounds normal.  Musculoskeletal:  Neck with normal flexion, slightly decreased extension but no pain or discomfort. Normal rotation right and left and normal side bending right and left without any discomfort. Left shoulder with normal range of motion with no pain or. Nontender over the trapezius or clavicle or shoulder blade.  Neurological: She is alert and oriented to person, place, and time.  Skin: Skin is warm and dry.  Psychiatric: She has a normal mood and affect. Her behavior is normal.          Assessment & Plan:  IFG - weight is up 3 lbs.  11 A1c of 6.1 today. Stable. Repeat in 6 months. Continue to work on diet and really encouraged her to get back into exercise. She was walking for exercise at one point in time but really overall has quit doing so.  Depression - PHQ- 9 score of 3. Well controlled. Continue current regimen. She still has some days where she does feel down. Next  Left upper chest pain-not in the typical cardiac area but I'm unable to re-create her pain with movement of the neck and shoulder. I do think the pain is a little bit deeper and more internal. We'll do an EKG and a chest x-ray today. We'll also check temperature and pulse ox. EKG today shows rate of 55 bpm, normal sinus rhythm with normal axis and no acute ST-T wave changes.  Low iron stores-due to recheck labs today.

## 2015-05-06 LAB — TROPONIN I: Troponin I: <0.01 ng/mL (ref <0.06)

## 2015-05-26 ENCOUNTER — Telehealth: Payer: Self-pay | Admitting: *Deleted

## 2015-05-26 DIAGNOSIS — M545 Low back pain: Secondary | ICD-10-CM

## 2015-05-26 DIAGNOSIS — M961 Postlaminectomy syndrome, not elsewhere classified: Secondary | ICD-10-CM

## 2015-05-26 DIAGNOSIS — M41125 Adolescent idiopathic scoliosis, thoracolumbar region: Secondary | ICD-10-CM

## 2015-06-03 NOTE — Telephone Encounter (Signed)
Referral placed.Theresa Scott  

## 2015-06-04 ENCOUNTER — Ambulatory Visit: Payer: Commercial Managed Care - HMO | Admitting: Family Medicine

## 2015-06-07 ENCOUNTER — Ambulatory Visit: Payer: Commercial Managed Care - HMO | Admitting: Family Medicine

## 2015-06-21 ENCOUNTER — Other Ambulatory Visit: Payer: Self-pay | Admitting: *Deleted

## 2015-06-21 MED ORDER — METOPROLOL SUCCINATE ER 25 MG PO TB24: 25.0000 mg | ORAL_TABLET | Freq: Every day | ORAL | Status: DC

## 2015-06-25 DIAGNOSIS — M5116 Intervertebral disc disorders with radiculopathy, lumbar region: Secondary | ICD-10-CM | POA: Diagnosis not present

## 2015-06-25 DIAGNOSIS — G894 Chronic pain syndrome: Secondary | ICD-10-CM | POA: Diagnosis not present

## 2015-06-25 DIAGNOSIS — Z79891 Long term (current) use of opiate analgesic: Secondary | ICD-10-CM | POA: Diagnosis not present

## 2015-06-25 DIAGNOSIS — Z79899 Other long term (current) drug therapy: Secondary | ICD-10-CM | POA: Diagnosis not present

## 2015-07-01 ENCOUNTER — Ambulatory Visit (INDEPENDENT_AMBULATORY_CARE_PROVIDER_SITE_OTHER): Payer: Commercial Managed Care - HMO | Admitting: Family Medicine

## 2015-07-01 VITALS — Temp 97.9°F

## 2015-07-01 DIAGNOSIS — Z23 Encounter for immunization: Secondary | ICD-10-CM | POA: Diagnosis not present

## 2015-07-02 ENCOUNTER — Ambulatory Visit: Payer: Commercial Managed Care - HMO | Admitting: Family Medicine

## 2015-07-05 ENCOUNTER — Telehealth: Payer: Self-pay | Admitting: *Deleted

## 2015-07-05 NOTE — Telephone Encounter (Signed)
Reordered pristiq 100 mg for pt. Pt ID [id number] . Conf# [id number].[name] [place]

## 2015-07-30 DIAGNOSIS — Z9884 Bariatric surgery status: Secondary | ICD-10-CM | POA: Diagnosis not present

## 2015-08-10 ENCOUNTER — Other Ambulatory Visit: Payer: Self-pay | Admitting: Family Medicine

## 2015-09-10 DIAGNOSIS — M5400 Panniculitis affecting regions of neck and back, site unspecified: Secondary | ICD-10-CM | POA: Diagnosis not present

## 2015-09-10 DIAGNOSIS — E6609 Other obesity due to excess calories: Secondary | ICD-10-CM | POA: Diagnosis not present

## 2015-09-10 DIAGNOSIS — F320 Major depressive disorder, single episode, mild: Secondary | ICD-10-CM | POA: Diagnosis not present

## 2015-09-10 DIAGNOSIS — E781 Pure hyperglyceridemia: Secondary | ICD-10-CM | POA: Diagnosis not present

## 2015-09-10 DIAGNOSIS — G894 Chronic pain syndrome: Secondary | ICD-10-CM | POA: Diagnosis not present

## 2015-09-10 DIAGNOSIS — G629 Polyneuropathy, unspecified: Secondary | ICD-10-CM | POA: Diagnosis not present

## 2015-09-10 DIAGNOSIS — Z6831 Body mass index (BMI) 31.0-31.9, adult: Secondary | ICD-10-CM | POA: Diagnosis not present

## 2015-09-10 DIAGNOSIS — Z0000 Encounter for general adult medical examination without abnormal findings: Secondary | ICD-10-CM | POA: Diagnosis not present

## 2015-09-24 DIAGNOSIS — G894 Chronic pain syndrome: Secondary | ICD-10-CM | POA: Diagnosis not present

## 2015-09-24 DIAGNOSIS — M961 Postlaminectomy syndrome, not elsewhere classified: Secondary | ICD-10-CM | POA: Diagnosis not present

## 2015-09-24 DIAGNOSIS — Z6833 Body mass index (BMI) 33.0-33.9, adult: Secondary | ICD-10-CM | POA: Diagnosis not present

## 2015-09-24 LAB — LIPID PANEL
Cholesterol: 146 mg/dL (ref 0–200)
HDL: 30 mg/dL — AB (ref 35–70)
LDL Cholesterol: 82 mg/dL
TC/HDL: 4.9
Triglycerides: 168 mg/dL — AB (ref 40–160)

## 2015-09-24 LAB — HEMOGLOBIN A1C: Hemoglobin A1C: 5.7

## 2015-09-28 ENCOUNTER — Other Ambulatory Visit: Payer: Self-pay | Admitting: *Deleted

## 2015-09-28 MED ORDER — DESVENLAFAXINE SUCCINATE ER 100 MG PO TB24: 100.0000 mg | ORAL_TABLET | Freq: Every day | ORAL | Status: DC

## 2015-10-05 ENCOUNTER — Telehealth: Payer: Self-pay | Admitting: Family Medicine

## 2015-10-05 NOTE — Telephone Encounter (Signed)
Pt called and stated she had turned in some paper work for her pristiq medication a couple weeks ago to you and Tonya and hasn't heard anything back about it being completed or not. She wanted to know if it has been faxed over to them or not. Thanks

## 2015-10-05 NOTE — Telephone Encounter (Signed)
I completed the paperwork over a week ago.  She should have been called so I'm sorry. May need to send Levada Dy nd see if she knows.

## 2015-10-08 NOTE — Telephone Encounter (Signed)
The pharmacy number is [id number]

## 2015-10-11 NOTE — Telephone Encounter (Signed)
Called in refill  

## 2015-10-12 ENCOUNTER — Ambulatory Visit (INDEPENDENT_AMBULATORY_CARE_PROVIDER_SITE_OTHER): Payer: Commercial Managed Care - HMO | Admitting: Osteopathic Medicine

## 2015-10-12 VITALS — BP 128/62 | HR 67 | Temp 97.9°F | Ht 67.0 in | Wt 210.0 lb

## 2015-10-12 DIAGNOSIS — B9789 Other viral agents as the cause of diseases classified elsewhere: Principal | ICD-10-CM

## 2015-10-12 DIAGNOSIS — J069 Acute upper respiratory infection, unspecified: Secondary | ICD-10-CM

## 2015-10-12 MED ORDER — BENZONATATE 200 MG PO CAPS: 200.0000 mg | ORAL_CAPSULE | Freq: Three times a day (TID) | ORAL | Status: DC | PRN

## 2015-10-12 MED ORDER — IPRATROPIUM BROMIDE 0.03 % NA SOLN: 2.0000 | Freq: Two times a day (BID) | NASAL | Status: DC

## 2015-10-12 NOTE — Patient Instructions (Signed)
Viral Infections °A viral infection can be caused by different types of viruses. Most viral infections are not serious and resolve on their own. However, some infections may cause severe symptoms and may lead to further complications. °SYMPTOMS °Viruses can frequently cause: °· Minor sore throat. °· Aches and pains. °· Headaches. °· Runny nose. °· Different types of rashes. °· Watery eyes. °· Tiredness. °· Cough. °· Loss of appetite. °· Gastrointestinal infections, resulting in nausea, vomiting, and diarrhea. °These symptoms do not respond to antibiotics because the infection is not caused by bacteria. However, you might catch a bacterial infection following the viral infection. This is sometimes called a "superinfection." Symptoms of such a bacterial infection may include: °· Worsening sore throat with pus and difficulty swallowing. °· Swollen neck glands. °· Chills and a high or persistent fever. °· Severe headache. °· Tenderness over the sinuses. °· Persistent overall ill feeling (malaise), muscle aches, and tiredness (fatigue). °· Persistent cough. °· Yellow, green, or brown mucus production with coughing. °HOME CARE INSTRUCTIONS  °· Only take over-the-counter or prescription medicines for pain, discomfort, diarrhea, or fever as directed by your caregiver. °· Drink enough water and fluids to keep your urine clear or pale yellow. Sports drinks can provide valuable electrolytes, sugars, and hydration. °· Get plenty of rest and maintain proper nutrition. Soups and broths with crackers or rice are fine. °SEEK IMMEDIATE MEDICAL CARE IF:  °· You have severe headaches, shortness of breath, chest pain, neck pain, or an unusual rash. °· You have uncontrolled vomiting, diarrhea, or you are unable to keep down fluids. °· You or your child has an oral temperature above 102° F (38.9° C), not controlled by medicine. °· Your baby is older than 3 months with a rectal temperature of 102° F (38.9° C) or higher. °· Your baby is 3  months old or younger with a rectal temperature of 100.4° F (38° C) or higher. °MAKE SURE YOU:  °· Understand these instructions. °· Will watch your condition. °· Will get help right away if you are not doing well or get worse. °  °This information is not intended to replace advice given to you by your health care provider. Make sure you discuss any questions you have with your health care provider. °  °Document Released: 07/19/2005 Document Revised: 01/01/2012 Document Reviewed: 03/17/2015 °Elsevier Interactive Patient Education ©2016 [employer] Inc[employer] ° °

## 2015-10-12 NOTE — Progress Notes (Signed)
HPI: Theresa Scott is a 59 y.o. female who presents to [facility]  today for chief complaint of:  Chief Complaint  Patient presents with  . Cough    . Location: sinuses/chest . Quality: pressure, coughing . Duration: 4 days . Context: yes sick contacts, no recent travel . Modifying factors: has tried the following OTC medications: "[employer] brand cough medicine"  without relief . Assoc signs/symptoms: no fever/chills, occasional productive cough, Yes  body aches, No  GI upset   Past medical, social and family history reviewed: Past Medical History  Diagnosis Date  . Chronic kidney disease   . Diabetes mellitus   . Hypertension   . GERD (gastroesophageal reflux disease)   . Hypercholesterolemia   . Degenerative disc disease   . Depression   . Complication of anesthesia   . Kidney stones    Past Surgical History  Procedure Laterality Date  . Tonsillectomy  1971  . Cholecystectomy  1976  . Hand surgery  2000    right  . Back surgery  1990,2003,2004,2010  . Cardiac surgery  2002  . Cardiac catheterization  2011    [facility].   . Gastric bypass  08/22/10  . Esophagogastroduodenoscopy N/A 01/27/2013    Procedure: ESOPHAGOGASTRODUODENOSCOPY (EGD) upper endoscopy;  Surgeon: Shann Medal, MD;  Location: [facility];  Service: General;  Laterality: N/A;  . Gastric bypass     Social History  Substance Use Topics  . Smoking status: Former Smoker -- 0.50 packs/day for 18 years    Quit date: 08/04/1990  . Smokeless tobacco: Never Used  . Alcohol Use: No   Family History  Problem Relation Age of Onset  . Cirrhosis Sister     secondary medication  . Liver disease Brother   . Kidney failure Brother     Dialysis at 58, born single kidney  . Liver cancer Brother     Current Outpatient Prescriptions  Medication Sig Dispense Refill  . amitriptyline (ELAVIL) 25 MG tablet TAKE 1 TO 2 TABLETS AT BEDTIME 180 tablet 1  . CALCIUM PO Take 2  tablets by mouth daily.    . Cyanocobalamin (VITAMIN B-12 PO) Take 1 tablet by mouth every Wednesday.    Marland Kitchen desvenlafaxine (PRISTIQ) 100 MG 24 hr tablet Take 1 tablet (100 mg total) by mouth daily. 90 tablet 3  . EPINEPHrine (EPIPEN) 0.3 mg/0.3 mL DEVI Inject 0.3 mLs (0.3 mg total) into the muscle as needed. 1 Device 0  . gabapentin (NEURONTIN) 300 MG capsule Take 300 mg by mouth daily.    Marland Kitchen HYDROcodone-acetaminophen (NORCO) 10-325 MG per tablet Take 1 tablet by mouth every 6 (six) hours as needed (for back pain).    . metoprolol succinate (TOPROL-XL) 25 MG 24 hr tablet Take 1 tablet (25 mg total) by mouth daily. 90 tablet 1  . Multiple Vitamin (MULTIVITAMIN) tablet Take 1 tablet by mouth 2 (two) times daily.     . benzonatate (TESSALON) 200 MG capsule Take 1 capsule (200 mg total) by mouth 3 (three) times daily as needed for cough. 30 capsule 0  . ipratropium (ATROVENT) 0.03 % nasal spray Place 2 sprays into both nostrils every 12 (twelve) hours. 30 mL 0   No current facility-administered medications for this visit.   Allergies  Allergen Reactions  . Bee Venom Anaphylaxis  . Loratadine Itching    REACTION: dizziness  . Nsaids Other (See Comments)    GI intolerance.    . Latex Rash  Review of Systems: CONSTITUTIONAL: no fever/chills HEAD/EYES/EARS/NOSE/THROAT: yes headache, no vision change or hearing change, yes sore throat CARDIAC: No chest pain/pressure/palpitations, no orthopnea RESPIRATORY: yes cough, no shortness of breath GASTROINTESTINAL: no nausea, no vomiting, no abdominal pain/blood in stool/diarrhea/constipation MUSCULOSKELETAL: yes myalgia/arthralgia   Exam:  BP 128/62 mmHg  Pulse 67  Temp(Src) 97.9 F (36.6 C) (Oral)  Ht 5\' 7"  (1.702 m)  Wt 210 lb (95.255 kg)  BMI 32.88 kg/m2 Constitutional: VSS, see above. General Appearance: alert, well-developed, well-nourished, NAD Eyes: Normal lids and conjunctive, non-icteric sclera, PERRLA Ears, Nose, Mouth, Throat:  Normal external inspection ears/nares/mouth/lips/gums, normal TM, MMM; posterior pharynx with erythema, without exudate Neck: No masses, trachea midline. No thyroid enlargement/tenderness/mass appreciated, normal lymph nodes Respiratory: Normal respiratory effort. No  wheeze/rhonchi/rales Cardiovascular: S1/S2 normal, no murmur/rub/gallop auscultated. RRR. No carotid bruit or JVD. No lower extremity edema.   No results found for this or any previous visit (from the past 72 hour(s)).    ASSESSMENT/PLAN:  Viral URI with cough - Plan: benzonatate (TESSALON) 200 MG capsule, ipratropium (ATROVENT) 0.03 % nasal spray Supportive care advised: honey for cough, low dose Ibuprofen and/pr Tylenol (labs reviewed, Hx mild transaminitis, hx CKD but last GFR ok), antihistamines, rest and hydration, handwashing. RTC if no better but abx will not be of help at this time.   Return if symptoms worsen or fail to improve.

## 2015-10-13 ENCOUNTER — Ambulatory Visit: Payer: Commercial Managed Care - HMO | Admitting: Family Medicine

## 2015-10-13 ENCOUNTER — Other Ambulatory Visit: Payer: Self-pay | Admitting: *Deleted

## 2015-10-22 ENCOUNTER — Encounter: Payer: Self-pay | Admitting: Family Medicine

## 2015-10-22 ENCOUNTER — Ambulatory Visit (INDEPENDENT_AMBULATORY_CARE_PROVIDER_SITE_OTHER): Payer: Commercial Managed Care - HMO | Admitting: Family Medicine

## 2015-10-22 VITALS — BP 141/73 | HR 65 | Wt 210.0 lb

## 2015-10-22 DIAGNOSIS — R7301 Impaired fasting glucose: Secondary | ICD-10-CM | POA: Diagnosis not present

## 2015-10-22 DIAGNOSIS — Z114 Encounter for screening for human immunodeficiency virus [HIV]: Secondary | ICD-10-CM | POA: Diagnosis not present

## 2015-10-22 DIAGNOSIS — Z1159 Encounter for screening for other viral diseases: Secondary | ICD-10-CM

## 2015-10-22 DIAGNOSIS — Z1231 Encounter for screening mammogram for malignant neoplasm of breast: Secondary | ICD-10-CM | POA: Diagnosis not present

## 2015-10-22 DIAGNOSIS — E785 Hyperlipidemia, unspecified: Secondary | ICD-10-CM | POA: Diagnosis not present

## 2015-10-22 DIAGNOSIS — Z7189 Other specified counseling: Secondary | ICD-10-CM

## 2015-10-22 DIAGNOSIS — F329 Major depressive disorder, single episode, unspecified: Secondary | ICD-10-CM

## 2015-10-22 LAB — COMPLETE METABOLIC PANEL WITH GFR
ALT: 40 U/L — ABNORMAL HIGH (ref 6–29)
AST: 48 U/L — ABNORMAL HIGH (ref 10–35)
Albumin: 4.1 g/dL (ref 3.6–5.1)
Alkaline Phosphatase: 100 U/L (ref 33–130)
BUN: 9 mg/dL (ref 7–25)
CO2: 30 mmol/L (ref 20–31)
Calcium: 9 mg/dL (ref 8.6–10.4)
Chloride: 104 mmol/L (ref 98–110)
Creat: 0.68 mg/dL (ref 0.50–1.05)
GFR, Est African American: >89 mL/min (ref >=60)
GFR, Est Non African American: >89 mL/min (ref >=60)
Glucose, Bld: 94 mg/dL (ref 65–99)
Potassium: 4.3 mmol/L (ref 3.5–5.3)
Sodium: 140 mmol/L (ref 135–146)
Total Bilirubin: 0.6 mg/dL (ref 0.2–1.2)
Total Protein: 6.6 g/dL (ref 6.1–8.1)

## 2015-10-22 NOTE — Progress Notes (Signed)
   Subjective:    Patient ID: Theresa Scott, female    DOB: 1956/05/07, 59 y.o.   MRN: [id number]  HPI Patient comes in today to go over results from a recent health fair where they performed a Medicare wellness exam. They reviewed some of her preventative care and encouraged her to discuss it with her primary care provider as well as advanced directives. They did do a fingerstick hemoglobin A1c and lipid panel.  He brought in a copy of her paperwork.  Advanced directives-she does have a copy of paperwork at home but has not completed it or had it notarized yet.  Impaired fasting glucose-they did do a hemoglobin A1c which was 5.7. Increased thirst or urination. She's not currently on medication. She is not exercising regularly.  Cholesterol looked fantastic on the blood work.  She also wanted to let me know that her mother passed away on Christmas Day. They found out about a week and a half ago that she had metastatic lung cancer and very quickly moved to hospice and then passed away on Christmas Day. They just had the funeral yesterday. Right now she is doing okay and is grieving but knows it is a blessing that her mother did not suffer.  Review of Systems     Objective:   Physical Exam  Constitutional: She is oriented to person, place, and time. She appears well-developed and well-nourished.  HENT:  Head: Normocephalic and atraumatic.  Cardiovascular: Normal rate, regular rhythm and normal heart sounds.   Pulmonary/Chest: Effort normal and breath sounds normal.  Neurological: She is alert and oriented to person, place, and time.  Skin: Skin is warm and dry.  Psychiatric: She has a normal mood and affect. Her behavior is normal.          Assessment & Plan:  Advanced directives-we discussed getting that completed and encouraged her to bring [place] a copy to put on file in her chart and to give her daughter who is going to be the primary decision maker for her and her living will a  copy. We discussed how this could be very helpful if she is unable to make her own medical decisions.  Impaired fasting glucose-still in the prediabetes stage. Will continue to monitor every 6 months. Encourage more regular exercise and weight loss.  We discussed that she is due for mammogram and we placed that order today.  She is also due for a Pap smear she will need these until age 59 and we discussed that today. She does have a GYN and insulin encouraged her to call their office to get scheduled. Next  Based on age recommend screening for hepatitis C and HIV. Lab slip provided today she will try to get that done.  Depression/anxiety-she still has not received her Pristiq through the patient assistance program through the pharmaceutical company. They're still saying that they have missing documentation. I let her know that we had already faxed the paperwork twice and they really should have it. Unless they are missing something from her such as financial statements.  Normal grieving.  Hyperlipidemia-currently well controlled.

## 2015-10-22 NOTE — Patient Instructions (Signed)
Please schedule your pap smear with your GYN.

## 2015-10-23 LAB — HEPATITIS C ANTIBODY: HCV Ab: NEGATIVE

## 2015-10-23 LAB — HIV ANTIBODY (ROUTINE TESTING W REFLEX): HIV 1&2 Ab, 4th Generation: NONREACTIVE

## 2015-10-30 ENCOUNTER — Other Ambulatory Visit: Payer: Self-pay | Admitting: Family Medicine

## 2015-11-16 ENCOUNTER — Encounter: Payer: Self-pay | Admitting: Family Medicine

## 2015-11-17 ENCOUNTER — Ambulatory Visit (INDEPENDENT_AMBULATORY_CARE_PROVIDER_SITE_OTHER): Payer: Commercial Managed Care - HMO

## 2015-11-17 DIAGNOSIS — Z1231 Encounter for screening mammogram for malignant neoplasm of breast: Secondary | ICD-10-CM | POA: Diagnosis not present

## 2015-11-19 ENCOUNTER — Encounter: Payer: Self-pay | Admitting: Family Medicine

## 2015-11-19 ENCOUNTER — Ambulatory Visit (INDEPENDENT_AMBULATORY_CARE_PROVIDER_SITE_OTHER): Payer: Commercial Managed Care - HMO | Admitting: Family Medicine

## 2015-11-19 VITALS — BP 136/74 | HR 69 | Temp 97.9°F | Wt 209.0 lb

## 2015-11-19 DIAGNOSIS — J069 Acute upper respiratory infection, unspecified: Secondary | ICD-10-CM | POA: Diagnosis not present

## 2015-11-19 DIAGNOSIS — R030 Elevated blood-pressure reading, without diagnosis of hypertension: Secondary | ICD-10-CM | POA: Diagnosis not present

## 2015-11-19 DIAGNOSIS — Z23 Encounter for immunization: Secondary | ICD-10-CM

## 2015-11-19 DIAGNOSIS — IMO0001 Reserved for inherently not codable concepts without codable children: Secondary | ICD-10-CM

## 2015-11-19 NOTE — Patient Instructions (Signed)
Upper Respiratory Infection, Adult Most upper respiratory infections (URIs) are a viral infection of the air passages leading to the lungs. A URI affects the nose, throat, and upper air passages. The most common type of URI is nasopharyngitis and is typically referred to as "the common cold." URIs run their course and usually go away on their own. Most of the time, a URI does not require medical attention, but sometimes a bacterial infection in the upper airways can follow a viral infection. This is called a secondary infection. Sinus and middle ear infections are common types of secondary upper respiratory infections. Bacterial pneumonia can also complicate a URI. A URI can worsen asthma and chronic obstructive pulmonary disease (COPD). Sometimes, these complications can require emergency medical care and may be life threatening.  CAUSES Almost all URIs are caused by viruses. A virus is a type of germ and can spread from one person to another.  RISKS FACTORS You may be at risk for a URI if:   You smoke.   You have chronic heart or lung disease.  You have a weakened defense (immune) system.   You are very young or very old.   You have nasal allergies or asthma.  You work in crowded or poorly ventilated areas.  You work in health care facilities or schools. SIGNS AND SYMPTOMS  Symptoms typically develop 2-3 days after you come in contact with a cold virus. Most viral URIs last 7-10 days. However, viral URIs from the influenza virus (flu virus) can last 14-18 days and are typically more severe. Symptoms may include:   Runny or stuffy (congested) nose.   Sneezing.   Cough.   Sore throat.   Headache.   Fatigue.   Fever.   Loss of appetite.   Pain in your forehead, behind your eyes, and over your cheekbones (sinus pain).  Muscle aches.  DIAGNOSIS  Your health care provider may diagnose a URI by:  Physical exam.  Tests to check that your symptoms are not due to  another condition such as:  Strep throat.  Sinusitis.  Pneumonia.  Asthma. TREATMENT  A URI goes away on its own with time. It cannot be cured with medicines, but medicines may be prescribed or recommended to relieve symptoms. Medicines may help:  Reduce your fever.  Reduce your cough.  Relieve nasal congestion. HOME CARE INSTRUCTIONS   Take medicines only as directed by your health care provider.   Gargle warm saltwater or take cough drops to comfort your throat as directed by your health care provider.  Use a warm mist humidifier or inhale steam from a shower to increase air moisture. This may make it easier to breathe.  Drink enough fluid to keep your urine clear or pale yellow.   Eat soups and other clear broths and maintain good nutrition.   Rest as needed.   Return to work when your temperature has returned to normal or as your health care provider advises. You may need to stay home longer to avoid infecting others. You can also use a face mask and careful hand washing to prevent spread of the virus.  Increase the usage of your inhaler if you have asthma.   Do not use any tobacco products, including cigarettes, chewing tobacco, or electronic cigarettes. If you need help quitting, ask your health care provider. PREVENTION  The best way to protect yourself from getting a cold is to practice good hygiene.   Avoid oral or hand contact with people with cold   symptoms.   Wash your hands often if contact occurs.  There is no clear evidence that vitamin C, vitamin E, echinacea, or exercise reduces the chance of developing a cold. However, it is always recommended to get plenty of rest, exercise, and practice good nutrition.  SEEK MEDICAL CARE IF:   You are getting worse rather than better.   Your symptoms are not controlled by medicine.   You have chills.  You have worsening shortness of breath.  You have brown or red mucus.  You have yellow or brown nasal  discharge.  You have pain in your face, especially when you bend forward.  You have a fever.  You have swollen neck glands.  You have pain while swallowing.  You have white areas in the back of your throat. SEEK IMMEDIATE MEDICAL CARE IF:   You have severe or persistent:  Headache.  Ear pain.  Sinus pain.  Chest pain.  You have chronic lung disease and any of the following:  Wheezing.  Prolonged cough.  Coughing up blood.  A change in your usual mucus.  You have a stiff neck.  You have changes in your:  Vision.  Hearing.  Thinking.  Mood. MAKE SURE YOU:   Understand these instructions.  Will watch your condition.  Will get help right away if you are not doing well or get worse.   This information is not intended to replace advice given to you by your health care provider. Make sure you discuss any questions you have with your health care provider.   Document Released: 04/04/2001 Document Revised: 02/23/2015 Document Reviewed: 01/14/2014 Elsevier Interactive Patient Education 2016 Elsevier Inc.  

## 2015-11-19 NOTE — Progress Notes (Signed)
   Subjective:    Patient ID: Theresa Scott, female    DOB: [birth year], 60 y.o.   MRN: [id number]  HPI F/U on BP.  Her BP was high last month when she was in the office.     URi sxs x 3 days. Sinus congestion and cough with Yellow sputum.  No fever, chills, or sweats. Voice has been hoarse.  Last week was around a lot dust. Mild sore throat that that is actually feeling a little bit better. No GI symptoms. No worsening or alleviating factors.   Review of Systems     Objective:   Physical Exam  Constitutional: She is oriented to person, place, and time. She appears well-developed and well-nourished.  HENT:  Head: Normocephalic and atraumatic.  Cardiovascular: Normal rate, regular rhythm and normal heart sounds.   Pulmonary/Chest: Effort normal and breath sounds normal.  Neurological: She is alert and oriented to person, place, and time.  Skin: Skin is warm and dry.  Psychiatric: She has a normal mood and affect. Her behavior is normal.          Assessment & Plan:  Elevated BP - well controlled a.  F/U in May.    URI - likely viral. Call if not better in one week.  Not better will treat for acute sinusitis  [facility] - schedule f/u in 5 months.   Dr. Radford Pax in [place].

## 2015-11-23 ENCOUNTER — Encounter: Payer: Self-pay | Admitting: Family Medicine

## 2015-12-03 ENCOUNTER — Other Ambulatory Visit: Payer: Self-pay | Admitting: Family Medicine

## 2015-12-03 DIAGNOSIS — M545 Low back pain: Secondary | ICD-10-CM

## 2015-12-03 DIAGNOSIS — M961 Postlaminectomy syndrome, not elsewhere classified: Secondary | ICD-10-CM

## 2015-12-03 DIAGNOSIS — M41125 Adolescent idiopathic scoliosis, thoracolumbar region: Secondary | ICD-10-CM

## 2015-12-09 DIAGNOSIS — M5116 Intervertebral disc disorders with radiculopathy, lumbar region: Secondary | ICD-10-CM | POA: Diagnosis not present

## 2015-12-09 DIAGNOSIS — M542 Cervicalgia: Secondary | ICD-10-CM | POA: Diagnosis not present

## 2015-12-09 DIAGNOSIS — G894 Chronic pain syndrome: Secondary | ICD-10-CM | POA: Diagnosis not present

## 2015-12-17 ENCOUNTER — Ambulatory Visit (INDEPENDENT_AMBULATORY_CARE_PROVIDER_SITE_OTHER): Payer: Commercial Managed Care - HMO | Admitting: Family Medicine

## 2015-12-17 ENCOUNTER — Other Ambulatory Visit (HOSPITAL_COMMUNITY)
Admission: RE | Admit: 2015-12-17 | Discharge: 2015-12-17 | Disposition: A | Discharge status: Home / Self Care | Payer: Commercial Managed Care - HMO | Source: Ambulatory Visit | Attending: Family Medicine | Admitting: Family Medicine

## 2015-12-17 ENCOUNTER — Encounter: Payer: Self-pay | Admitting: Family Medicine

## 2015-12-17 VITALS — BP 127/57 | HR 76

## 2015-12-17 DIAGNOSIS — Z01419 Encounter for gynecological examination (general) (routine) without abnormal findings: Secondary | ICD-10-CM | POA: Insufficient documentation

## 2015-12-17 DIAGNOSIS — Z1151 Encounter for screening for human papillomavirus (HPV): Secondary | ICD-10-CM | POA: Diagnosis not present

## 2015-12-17 DIAGNOSIS — Z0000 Encounter for general adult medical examination without abnormal findings: Secondary | ICD-10-CM

## 2015-12-17 DIAGNOSIS — R7301 Impaired fasting glucose: Secondary | ICD-10-CM | POA: Diagnosis not present

## 2015-12-17 DIAGNOSIS — K760 Fatty (change of) liver, not elsewhere classified: Secondary | ICD-10-CM | POA: Diagnosis not present

## 2015-12-17 LAB — POCT UA - MICROALBUMIN
Creatinine, POC: 50 mg/dL
Microalbumin Ur, POC: 10 mg/L

## 2015-12-17 LAB — POCT GLYCOSYLATED HEMOGLOBIN (HGB A1C): Hemoglobin A1C: 5.8

## 2015-12-17 MED ORDER — EPINEPHRINE 0.3 MG/0.3ML IJ SOAJ: 0.3000 mg | Freq: Once | INTRAMUSCULAR | Status: DC

## 2015-12-17 MED ORDER — NYSTATIN 100000 UNIT/GM EX POWD: 1.0000 g | Freq: Two times a day (BID) | CUTANEOUS | Status: DC | PRN

## 2015-12-17 NOTE — Progress Notes (Signed)
Subjective:    Theresa Scott is a 60 y.o. female who presents for Medicare Annual/Subsequent preventive examination.  Preventive Screening-Counseling & Management  Tobacco History  Smoking status  . Former Smoker -- 0.50 packs/day for 18 years  . Quit date: 08/04/1990  Smokeless tobacco  . Never Used     Problems Prior to Visit 1.   Current Problems (verified) Patient Active Problem List   Diagnosis Date Noted  . AVNRT (AV nodal re-entry tachycardia) ([place]) 08/18/2013  . Daytime somnolence 06/26/2013  . Sleep disorder, circadian 06/26/2013  . History of gastric bypass, 08/22/2010. 11/03/2011  . IMPAIRED FASTING GLUCOSE 12/15/2010  . ANXIETY 01/31/2010  . PSVT 12/01/2009  . STASIS DERMATITIS 04/06/2009  . ALLERGIC RHINITIS 08/19/2008  . PERIPHERAL EDEMA 08/12/2008  . DIABETIC PERIPHERAL NEUROPATHY 07/20/2008  . Hyperlipemia 06/18/2008  . OBESITY 06/18/2008  . Major depression, chronic ([place]) 06/18/2008  . Peripheral vascular disease, unspecified ([place]) 06/18/2008  . GERD 06/18/2008  . OSTEOARTHRITIS 06/18/2008  . Backache 06/18/2008  . INSOMNIA 06/18/2008  . INCONTINENCE 06/18/2008  . PROTEINURIA 06/18/2008  . NEPHROLITHIASIS, HX OF 06/18/2008    Medications Prior to Visit Current Outpatient Prescriptions on File Prior to Visit  Medication Sig Dispense Refill  . amitriptyline (ELAVIL) 25 MG tablet TAKE 1 TO 2 TABLETS AT BEDTIME 180 tablet 1  . baclofen (LIORESAL) 10 MG tablet     . CALCIUM PO Take 2 tablets by mouth daily.    . Cyanocobalamin (VITAMIN B-12 PO) Take 1 tablet by mouth every Wednesday.    Marland Kitchen desvenlafaxine (PRISTIQ) 100 MG 24 hr tablet Take 1 tablet (100 mg total) by mouth daily. 90 tablet 3  . gabapentin (NEURONTIN) 300 MG capsule Take 300 mg by mouth daily.    Marland Kitchen HYDROcodone-acetaminophen (NORCO) 10-325 MG per tablet Take 1 tablet by mouth every 6 (six) hours as needed (for back pain).    Marland Kitchen ipratropium (ATROVENT) 0.03 % nasal spray Place 2 sprays  into both nostrils every 12 (twelve) hours. 30 mL 0  . metoprolol succinate (TOPROL-XL) 25 MG 24 hr tablet TAKE 1 TABLET EVERY DAY 90 tablet 1  . Multiple Vitamin (MULTIVITAMIN) tablet Take 1 tablet by mouth 2 (two) times daily.      No current facility-administered medications on file prior to visit.    Current Medications (verified) Current Outpatient Prescriptions  Medication Sig Dispense Refill  . amitriptyline (ELAVIL) 25 MG tablet TAKE 1 TO 2 TABLETS AT BEDTIME 180 tablet 1  . baclofen (LIORESAL) 10 MG tablet     . CALCIUM PO Take 2 tablets by mouth daily.    . Cyanocobalamin (VITAMIN B-12 PO) Take 1 tablet by mouth every Wednesday.    Marland Kitchen desvenlafaxine (PRISTIQ) 100 MG 24 hr tablet Take 1 tablet (100 mg total) by mouth daily. 90 tablet 3  . gabapentin (NEURONTIN) 300 MG capsule Take 300 mg by mouth daily.    Marland Kitchen HYDROcodone-acetaminophen (NORCO) 10-325 MG per tablet Take 1 tablet by mouth every 6 (six) hours as needed (for back pain).    Marland Kitchen ipratropium (ATROVENT) 0.03 % nasal spray Place 2 sprays into both nostrils every 12 (twelve) hours. 30 mL 0  . metoprolol succinate (TOPROL-XL) 25 MG 24 hr tablet TAKE 1 TABLET EVERY DAY 90 tablet 1  . Multiple Vitamin (MULTIVITAMIN) tablet Take 1 tablet by mouth 2 (two) times daily.     Marland Kitchen EPINEPHrine 0.3 mg/0.3 mL IJ SOAJ injection Inject 0.3 mLs (0.3 mg total) into the muscle once. 2  Device prn   No current facility-administered medications for this visit.     Allergies (verified) Bee venom; Loratadine; Nsaids; and Latex   PAST HISTORY  Family History Family History  Problem Relation Age of Onset  . Cirrhosis Sister     secondary medication  . Liver disease Brother   . Kidney failure Brother     Dialysis at 64, born single kidney  . Liver cancer Brother   . Lung cancer Mother     Social History Social History  Substance Use Topics  . Smoking status: Former Smoker -- 0.50 packs/day for 18 years    Quit date: 08/04/1990  .  Smokeless tobacco: Never Used  . Alcohol Use: No     Are there smokers in your home (other than you)? No  Risk Factors Current exercise habits: The patient does not participate in regular exercise at present.  Dietary issues discussed: None   Cardiac risk factors: obesity (BMI >= 30 kg/m2).  Depression Screen (Note: if answer to either of the following is "Yes", a more complete depression screening is indicated)   Over the past two weeks, have you felt down, depressed or hopeless? No  Over the past two weeks, have you felt little interest or pleasure in doing things? No  Have you lost interest or pleasure in daily life? No  Do you often feel hopeless? No  Do you cry easily over simple problems? No  Activities of Daily Living In your present state of health, do you have any difficulty performing the following activities?:  Driving? No Managing money?  No Feeding yourself? No Getting from bed to chair? No   Climbing a flight of stairs? No Preparing food and eating?: No Bathing or showering? No Getting dressed: No Getting to the toilet? No Using the toilet:No Moving around from place to place: No In the past year have you fallen or had a near fall?:No   Are you sexually active?  Yes  Do you have more than one partner?  No  Hearing Difficulties: No Do you often ask people to speak up or repeat themselves? No Do you experience ringing or noises in your ears? No Do you have difficulty understanding soft or whispered voices? No   Do you feel that you have a problem with memory? No  Do you often misplace items? No  Do you feel safe at home?  Yes  Cognitive Testing  Alert? Yes  Normal Appearance?Yes  Oriented to person? Yes  Place? Yes   Time? Yes  Recall of three objects?  Yes  Can perform simple calculations? Yes  Displays appropriate judgment?Yes  Can read the correct time from a watch face?Yes   Advanced Directives have been discussed with the patient? Yes  List  the Names of Other Physician/Practitioners you currently use: 1.    Indicate any recent Medical Services you may have received from other than [facility] providers in the past year (date may be approximate).  Immunization History  Administered Date(s) Administered  . Influenza Split 08/15/2011, 09/09/2012  . Influenza Whole 07/20/2008, 08/12/2009, 08/03/2010  . Influenza,inj,Quad PF,36+ Mos 06/26/2013, 09/15/2014, 07/01/2015  . Pneumococcal-Unspecified 10/23/2001  . Tdap 11/19/2015    Screening Tests Health Maintenance  Topic Date Due  . OPHTHALMOLOGY EXAM  04/11/1966  . PNEUMOCOCCAL POLYSACCHARIDE VACCINE (2) 10/23/2006  . URINE MICROALBUMIN  05/18/2010  . PAP SMEAR  09/22/2013  . FOOT EXAM  09/16/2015  . HEMOGLOBIN A1C  03/24/2016  . INFLUENZA VACCINE  05/23/2016  .  COLONOSCOPY  10/23/2017  . MAMMOGRAM  11/16/2017  . TETANUS/TDAP  11/18/2025  . Hepatitis C Screening  Completed  . HIV Screening  Completed    All answers were reviewed with the patient and necessary referrals were made:  [name],CATHERINE, MD   12/17/2015   History reviewed: allergies, current medications, past family history, past medical history, past social history, past surgical history and problem list  Review of Systems A comprehensive review of systems was negative.    Objective:     Vision by Snellen chart: see eye notes  There is no weight on file to calculate BMI. BP 127/57 mmHg  Pulse 76  SpO2 96%  BP 127/57 mmHg  Pulse 76  SpO2 96% General appearance: alert, cooperative and appears stated age Head: Normocephalic, without obvious abnormality, atraumatic Eyes: conj clear, EOMI, PEERLA Ears: normal TM's and external ear canals both ears Nose: Nares normal. Septum midline. Mucosa normal. No drainage or sinus tenderness. Throat: lips, mucosa, and tongue normal; teeth and gums normal Neck: no adenopathy, no carotid bruit, no JVD, supple, symmetrical, trachea midline and thyroid not enlarged,  symmetric, no tenderness/mass/nodules Back: symmetric, no curvature. ROM normal. No CVA tenderness. Lungs: clear to auscultation bilaterally Breasts: normal appearance, no masses or tenderness Heart: regular rate and rhythm, S1, S2 normal, no murmur, click, rub or gallop Abdomen: soft, non-tender; bowel sounds normal; no masses,  no organomegaly Pelvic: cervix normal in appearance, external genitalia normal, no adnexal masses or tenderness, no cervical motion tenderness, rectovaginal septum normal, uterus normal size, shape, and consistency, vagina normal without discharge and mild uterine prolapase Extremities: extremities normal, atraumatic, no cyanosis or edema Pulses: 2+ and symmetric Skin: Skin color, texture, turgor normal. No rashes or lesions Lymph nodes: Cervical, supraclavicular, and axillary nodes normal. Neurologic: Alert and oriented X 3, normal strength and tone. Normal symmetric reflexes. Normal coordination and gait     Assessment:     Medicare Wellness Exam       Plan:     During the course of the visit the patient was educated and counseled about appropriate screening and preventive services including:    scheduled for eye exam next months.    candiasis - rx nystatin powder.    Urine micro done today as well.    Pap smear peformed today.   Diet review for nutrition referral? Yes ____  Not Indicated __x__   Patient Instructions (the written plan) was given to the patient.  Medicare Attestation I have personally reviewed: The patient's medical and social history Their use of alcohol, tobacco or illicit drugs Their current medications and supplements The patient's functional ability including ADLs,fall risks, home safety risks, cognitive, and hearing and visual impairment Diet and physical activities Evidence for depression or mood disorders  The patient's weight, height, BMI, and visual acuity have been recorded in the chart.  I have made referrals,  counseling, and provided education to the patient based on review of the above and I have provided the patient with a written personalized care plan for preventive services.     [name],CATHERINE, MD   12/17/2015

## 2015-12-17 NOTE — Patient Instructions (Signed)
Keep up a regular exercise program and make sure you are eating a healthy diet Try to eat 4 servings of dairy a day, or if you are lactose intolerant take a calcium with vitamin D daily.  Your vaccines are up to date.   

## 2015-12-21 ENCOUNTER — Other Ambulatory Visit: Payer: Self-pay | Admitting: Family Medicine

## 2015-12-21 DIAGNOSIS — K760 Fatty (change of) liver, not elsewhere classified: Secondary | ICD-10-CM

## 2015-12-21 LAB — CYTOLOGY - PAP

## 2015-12-22 NOTE — Progress Notes (Signed)
Quick Note:  Call patient: Your Pap smear is normal. Repeat in 5 years. ______ 

## 2015-12-23 ENCOUNTER — Other Ambulatory Visit: Payer: Commercial Managed Care - HMO

## 2015-12-23 ENCOUNTER — Ambulatory Visit (INDEPENDENT_AMBULATORY_CARE_PROVIDER_SITE_OTHER): Payer: Commercial Managed Care - HMO

## 2015-12-23 DIAGNOSIS — N189 Chronic kidney disease, unspecified: Secondary | ICD-10-CM | POA: Diagnosis not present

## 2015-12-23 DIAGNOSIS — K760 Fatty (change of) liver, not elsewhere classified: Secondary | ICD-10-CM

## 2016-02-15 DIAGNOSIS — G894 Chronic pain syndrome: Secondary | ICD-10-CM | POA: Diagnosis not present

## 2016-02-15 DIAGNOSIS — M5116 Intervertebral disc disorders with radiculopathy, lumbar region: Secondary | ICD-10-CM | POA: Diagnosis not present

## 2016-03-13 ENCOUNTER — Other Ambulatory Visit: Payer: Self-pay | Admitting: Family Medicine

## 2016-03-14 ENCOUNTER — Telehealth: Payer: Self-pay | Admitting: *Deleted

## 2016-03-14 NOTE — Telephone Encounter (Addendum)
Reordered pristiq 100 mg for pt. Pt ID [id number] . Conf# [id number].[name] [place]

## 2016-03-14 NOTE — Telephone Encounter (Signed)
Pt informed of order being placed.Theresa Scott

## 2016-03-29 ENCOUNTER — Encounter: Payer: Self-pay | Admitting: Family Medicine

## 2016-03-29 ENCOUNTER — Ambulatory Visit (INDEPENDENT_AMBULATORY_CARE_PROVIDER_SITE_OTHER): Payer: Commercial Managed Care - HMO | Admitting: Family Medicine

## 2016-03-29 VITALS — BP 144/87 | HR 64 | Wt 213.0 lb

## 2016-03-29 DIAGNOSIS — Z9884 Bariatric surgery status: Secondary | ICD-10-CM

## 2016-03-29 DIAGNOSIS — Z9889 Other specified postprocedural states: Secondary | ICD-10-CM

## 2016-03-29 DIAGNOSIS — R0602 Shortness of breath: Secondary | ICD-10-CM | POA: Diagnosis not present

## 2016-03-29 DIAGNOSIS — K8500 Idiopathic acute pancreatitis without necrosis or infection: Secondary | ICD-10-CM

## 2016-03-29 DIAGNOSIS — R112 Nausea with vomiting, unspecified: Secondary | ICD-10-CM | POA: Diagnosis not present

## 2016-03-29 DIAGNOSIS — R609 Edema, unspecified: Secondary | ICD-10-CM | POA: Diagnosis not present

## 2016-03-29 LAB — COMPREHENSIVE METABOLIC PANEL
ALT: 35 U/L — ABNORMAL HIGH (ref 6–29)
AST: 48 U/L — ABNORMAL HIGH (ref 10–35)
Albumin: 4 g/dL (ref 3.6–5.1)
Alkaline Phosphatase: 98 U/L (ref 33–130)
BUN: 8 mg/dL (ref 7–25)
CO2: 28 mmol/L (ref 20–31)
Calcium: 9.1 mg/dL (ref 8.6–10.4)
Chloride: 106 mmol/L (ref 98–110)
Creat: 0.65 mg/dL (ref 0.50–1.05)
Glucose, Bld: 91 mg/dL (ref 65–99)
Potassium: 4.3 mmol/L (ref 3.5–5.3)
Sodium: 144 mmol/L (ref 135–146)
Total Bilirubin: 0.6 mg/dL (ref 0.2–1.2)
Total Protein: 6.2 g/dL (ref 6.1–8.1)

## 2016-03-29 LAB — CBC
HCT: 41.4 % (ref 35.0–45.0)
Hemoglobin: 13.9 g/dL (ref 11.7–15.5)
MCH: 31.2 pg (ref 27.0–33.0)
MCHC: 33.6 g/dL (ref 32.0–36.0)
MCV: 93 fL (ref 80.0–100.0)
MPV: 11.7 fL (ref 7.5–12.5)
Platelets: 173 10*3/uL (ref 140–400)
RBC: 4.45 MIL/uL (ref 3.80–5.10)
RDW: 12.6 % (ref 11.0–15.0)
WBC: 6.2 10*3/uL (ref 3.8–10.8)

## 2016-03-29 LAB — LIPASE: Lipase: 281 U/L — ABNORMAL HIGH (ref 7–60)

## 2016-03-29 MED ORDER — OMEPRAZOLE 40 MG PO CPDR: 40.0000 mg | DELAYED_RELEASE_CAPSULE | Freq: Every day | ORAL | Status: DC

## 2016-03-29 MED ORDER — ONDANSETRON HCL 4 MG PO TABS: 4.0000 mg | ORAL_TABLET | Freq: Three times a day (TID) | ORAL | Status: DC | PRN

## 2016-03-29 NOTE — Patient Instructions (Signed)
Thank you for coming in today. Get labs today,  Take omeprazole daily.  Try taking Zofran for nausea or vomiting.  Return next week if not better.   If your belly pain worsens, or you have high fever, bad vomiting, blood in your stool or black tarry stool go to the Emergency Room.   Nausea and Vomiting Nausea is a sick feeling that often comes before throwing up (vomiting). Vomiting is a reflex where stomach contents come out of your mouth. Vomiting can cause severe loss of body fluids (dehydration). Children and elderly adults can become dehydrated quickly, especially if they also have diarrhea. Nausea and vomiting are symptoms of a condition or disease. It is important to find the cause of your symptoms. CAUSES   Direct irritation of the stomach lining. This irritation can result from increased acid production (gastroesophageal reflux disease), infection, food poisoning, taking certain medicines (such as nonsteroidal anti-inflammatory drugs), alcohol use, or tobacco use.  Signals from the brain.These signals could be caused by a headache, heat exposure, an inner ear disturbance, increased pressure in the brain from injury, infection, a tumor, or a concussion, pain, emotional stimulus, or metabolic problems.  An obstruction in the gastrointestinal tract (bowel obstruction).  Illnesses such as diabetes, hepatitis, gallbladder problems, appendicitis, kidney problems, cancer, sepsis, atypical symptoms of a heart attack, or eating disorders.  Medical treatments such as chemotherapy and radiation.  Receiving medicine that makes you sleep (general anesthetic) during surgery. DIAGNOSIS Your caregiver may ask for tests to be done if the problems do not improve after a few days. Tests may also be done if symptoms are severe or if the reason for the nausea and vomiting is not clear. Tests may include:  Urine tests.  Blood tests.  Stool tests.  Cultures (to look for evidence of  infection).  X-rays or other imaging studies. Test results can help your caregiver make decisions about treatment or the need for additional tests. TREATMENT You need to stay well hydrated. Drink frequently but in small amounts.You may wish to drink water, sports drinks, clear broth, or eat frozen ice pops or gelatin dessert to help stay hydrated.When you eat, eating slowly may help prevent nausea.There are also some antinausea medicines that may help prevent nausea. HOME CARE INSTRUCTIONS   Take all medicine as directed by your caregiver.  If you do not have an appetite, do not force yourself to eat. However, you must continue to drink fluids.  If you have an appetite, eat a normal diet unless your caregiver tells you differently.  Eat a variety of complex carbohydrates (rice, wheat, potatoes, bread), lean meats, yogurt, fruits, and vegetables.  Avoid high-fat foods because they are more difficult to digest.  Drink enough water and fluids to keep your urine clear or pale yellow.  If you are dehydrated, ask your caregiver for specific rehydration instructions. Signs of dehydration may include:  Severe thirst.  Dry lips and mouth.  Dizziness.  Dark urine.  Decreasing urine frequency and amount.  Confusion.  Rapid breathing or pulse. SEEK IMMEDIATE MEDICAL CARE IF:   You have blood or brown flecks (like coffee grounds) in your vomit.  You have black or bloody stools.  You have a severe headache or stiff neck.  You are confused.  You have severe abdominal pain.  You have chest pain or trouble breathing.  You do not urinate at least once every 8 hours.  You develop cold or clammy skin.  You continue to vomit for longer than  24 to 48 hours.  You have a fever. MAKE SURE YOU:   Understand these instructions.  Will watch your condition.  Will get help right away if you are not doing well or get worse.   This information is not intended to replace advice  given to you by your health care provider. Make sure you discuss any questions you have with your health care provider.   Document Released: 10/09/2005 Document Revised: 01/01/2012 Document Reviewed: 03/08/2011 Elsevier Interactive Patient Education [employer].

## 2016-03-29 NOTE — Progress Notes (Signed)
Theresa Scott is a 60 y.o. female who presents to [facility] today for nausea and vomiting. Patient has had a several day history of nausea associated with one or 2 episodes of vomiting. She feels vaguely lightheaded but denies any vertigo or presyncopal feelings. No chest pains or palpitations shortness of breath. She does note intermittent ankle swelling as well. She notes this tends to be worsening evening and better during the day. She has tried some over-the-counter medicines which helped a bit. She feels well otherwise.   Past Medical History  Diagnosis Date  . Chronic kidney disease   . Diabetes mellitus   . Hypertension   . GERD (gastroesophageal reflux disease)   . Hypercholesterolemia   . Degenerative disc disease   . Depression   . Complication of anesthesia   . Kidney stones    Past Surgical History  Procedure Laterality Date  . Tonsillectomy  1971  . Cholecystectomy  1976  . Hand surgery  2000    right  . Back surgery  1990,2003,2004,2010  . Cardiac surgery  2002  . Cardiac catheterization  2011    [facility].   . Gastric bypass  08/22/10  . Esophagogastroduodenoscopy N/A 01/27/2013    Procedure: ESOPHAGOGASTRODUODENOSCOPY (EGD) upper endoscopy;  Surgeon: Shann Medal, MD;  Location: [facility];  Service: General;  Laterality: N/A;  . Gastric bypass     Social History  Substance Use Topics  . Smoking status: Former Smoker -- 0.50 packs/day for 18 years    Quit date: 08/04/1990  . Smokeless tobacco: Never Used  . Alcohol Use: No   family history includes Cirrhosis in her sister; Kidney failure in her brother; Liver cancer in her brother; Liver disease in her brother; Lung cancer in her mother.  ROS as above:  Medications: Current Outpatient Prescriptions  Medication Sig Dispense Refill  . amitriptyline (ELAVIL) 25 MG tablet TAKE 1  TO 2 TABLETS AT BEDTIME 180 tablet 1  . baclofen (LIORESAL) 10 MG tablet     . CALCIUM PO Take 2 tablets by mouth daily.    . Cyanocobalamin (VITAMIN B-12 PO) Take 1 tablet by mouth every Wednesday.    Marland Kitchen desvenlafaxine (PRISTIQ) 100 MG 24 hr tablet Take 1 tablet (100 mg total) by mouth daily. 90 tablet 3  . EPINEPHrine 0.3 mg/0.3 mL IJ SOAJ injection Inject 0.3 mLs (0.3 mg total) into the muscle once. 2 Device prn  . gabapentin (NEURONTIN) 300 MG capsule Take 300 mg by mouth daily.    Marland Kitchen HYDROcodone-acetaminophen (NORCO) 10-325 MG per tablet Take 1 tablet by mouth every 6 (six) hours as needed (for back pain).    Marland Kitchen ipratropium (ATROVENT) 0.03 % nasal spray Place 2 sprays into both nostrils every 12 (twelve) hours. 30 mL 0  . metoprolol succinate (TOPROL-XL) 25 MG 24 hr tablet TAKE 1 TABLET EVERY DAY 90 tablet 0  . Multiple Vitamin (MULTIVITAMIN) tablet Take 1 tablet by mouth 2 (two) times daily.     Marland Kitchen nystatin (MYCOSTATIN/NYSTOP) 100000 UNIT/GM POWD Apply 1 g topically 2 (two) times daily as needed. 60 g PRN  . omeprazole (PRILOSEC) 40 MG capsule Take 1 capsule (40 mg total) by mouth daily. 30 capsule 3  . ondansetron (ZOFRAN) 4 MG tablet Take 1 tablet (4 mg total) by mouth every 8 (eight) hours as needed for nausea or vomiting. 20 tablet 0   No current facility-administered medications for this visit.  Allergies  Allergen Reactions  . Bee Venom Anaphylaxis  . Loratadine Itching    REACTION: dizziness  . Nsaids Other (See Comments)    GI intolerance.    . Latex Rash     Exam:  BP 144/87 mmHg  Pulse 64  Wt 213 lb (96.616 kg)  SpO2 96%  Orthostatic VS for the past 24 hrs:  BP- Lying Pulse- Lying BP- Sitting Pulse- Sitting BP- Standing at 0 minutes Pulse- Standing at 0 minutes  03/29/16 1043 (!) 154/95 mmHg 62 (!) 153/91 mmHg 67 144/87 mmHg 64      Gen: Well NAD HEENT: EOMI,  MMM Lungs: Normal work of breathing. CTABL Heart: RRR no MRG Abd: NABS, Soft. Nondistended,  Nontender Exts: Brisk capillary refill, warm and well perfused. No ankle swelling  No results found for this or any previous visit (from the past 24 hour(s)). No results found.    Assessment and Plan: 60 y.o. female with vague nausea with one episode of vomiting with vague lightheadedness and intermittent lower extremity edema. Unclear etiology. This is possibly viral gastroenteritis. Plan for basic lab workup trial of omeprazole and Zofran. Obtain CBC CMP and lipase and urease breath test for H. Pylori. Additionally check BNP for ankle swelling. Follow-up with PCP or myself next week if not better.  Discussed warning signs or symptoms. Please see discharge instructions. Patient expresses understanding.   CC [name],CATHERINE, MD

## 2016-03-30 ENCOUNTER — Ambulatory Visit: Payer: Commercial Managed Care - HMO | Admitting: Family Medicine

## 2016-03-30 DIAGNOSIS — K859 Acute pancreatitis without necrosis or infection, unspecified: Secondary | ICD-10-CM | POA: Insufficient documentation

## 2016-03-30 LAB — BRAIN NATRIURETIC PEPTIDE: Brain Natriuretic Peptide: 86.4 pg/mL (ref <100)

## 2016-03-30 NOTE — Progress Notes (Signed)
Quick Note:  Labs show pancreas inflammation. Please start eating a clear liquid diet (broth, jello, etc). Return early next week or sooner. ______

## 2016-03-31 LAB — H. PYLORI BREATH TEST: H. pylori Breath Test: NOT DETECTED

## 2016-04-04 ENCOUNTER — Encounter: Payer: Self-pay | Admitting: Family Medicine

## 2016-04-04 ENCOUNTER — Ambulatory Visit (INDEPENDENT_AMBULATORY_CARE_PROVIDER_SITE_OTHER): Payer: Commercial Managed Care - HMO | Admitting: Family Medicine

## 2016-04-04 VITALS — BP 112/72 | HR 70 | Wt 203.0 lb

## 2016-04-04 DIAGNOSIS — K8500 Idiopathic acute pancreatitis without necrosis or infection: Secondary | ICD-10-CM | POA: Diagnosis not present

## 2016-04-04 DIAGNOSIS — Z79899 Other long term (current) drug therapy: Secondary | ICD-10-CM | POA: Diagnosis not present

## 2016-04-04 DIAGNOSIS — K859 Acute pancreatitis without necrosis or infection, unspecified: Secondary | ICD-10-CM | POA: Diagnosis not present

## 2016-04-04 MED ORDER — ONDANSETRON 4 MG PO TBDP: 4.0000 mg | ORAL_TABLET | Freq: Three times a day (TID) | ORAL | Status: DC | PRN

## 2016-04-04 NOTE — Progress Notes (Signed)
Subjective:    CC: pancreatitis  HPI:  This is following up today for pancreatitis. She was seen about 6 days ago for nausea vomiting abdominal pain. She had a workup including a CBC which was normal, with joints which were normal, liver enzymes which are mildly elevated but stable over the last year. Her lipase was elevated at 281. Her BNP was normal so no sign of heart failure. She had complained of some mild ankle swelling around that time. She has not vomited since then and is doing a little better but still feeling nauseated. In fact she didn't eat today. She has had a prior history of a gastric bypass.  She doesn't drink alcohol.   Past medical history, Surgical history, Family history not pertinant except as noted below, Social history, Allergies, and medications have been entered into the medical record, reviewed, and corrections made.   Review of Systems: No fevers, chills, night sweats, weight loss, chest pain, or shortness of breath.   Objective:    General: Well Developed, well nourished, and in no acute distress.  Neuro: Alert and oriented x3, extra-ocular muscles intact, sensation grossly intact.  HEENT: Normocephalic, atraumatic  Skin: Warm and dry, no rashes. Cardiac: Regular rate and rhythm, no murmurs rubs or gallops, no lower extremity edema.  Respiratory: Clear to auscultation bilaterally. Not using accessory muscles, speaking in full sentences. Abd: Normal bowel sounds, tender at the epigastric area and left upper quadrant. No rebound guarding or masses.  A&P  Acute pancreatitis-continue with liquid diet until feeling better. We'll recheck lipase and CBC today. if not improving and levels are still elevated and recommend CT of the abdomen with contrast for further evaluation.

## 2016-04-04 NOTE — Addendum Note (Signed)
Addended by: Teddy Spike on: 04/04/2016 02:53 PM   Modules accepted: Orders, Medications

## 2016-04-05 LAB — CBC WITH DIFFERENTIAL/PLATELET
Basophils Absolute: 67 cells/uL (ref 0–200)
Basophils Relative: 1 %
Eosinophils Absolute: 134 cells/uL (ref 15–500)
Eosinophils Relative: 2 %
HCT: 45.7 % — ABNORMAL HIGH (ref 35.0–45.0)
Hemoglobin: 15.4 g/dL (ref 11.7–15.5)
Lymphocytes Relative: 29 %
Lymphs Abs: 1943 cells/uL (ref 850–3900)
MCH: 31 pg (ref 27.0–33.0)
MCHC: 33.7 g/dL (ref 32.0–36.0)
MCV: 92 fL (ref 80.0–100.0)
MPV: 12 fL (ref 7.5–12.5)
Monocytes Absolute: 670 cells/uL (ref 200–950)
Monocytes Relative: 10 %
Neutro Abs: 3886 cells/uL (ref 1500–7800)
Neutrophils Relative %: 58 %
Platelets: 214 10*3/uL (ref 140–400)
RBC: 4.97 MIL/uL (ref 3.80–5.10)
RDW: 12.9 % (ref 11.0–15.0)
WBC: 6.7 10*3/uL (ref 3.8–10.8)

## 2016-04-05 LAB — LIPASE: Lipase: 75 U/L — ABNORMAL HIGH (ref 7–60)

## 2016-04-17 ENCOUNTER — Encounter: Payer: Self-pay | Admitting: Family Medicine

## 2016-04-17 ENCOUNTER — Telehealth: Payer: Self-pay | Admitting: Family Medicine

## 2016-04-17 ENCOUNTER — Ambulatory Visit (INDEPENDENT_AMBULATORY_CARE_PROVIDER_SITE_OTHER): Payer: Commercial Managed Care - HMO | Admitting: Family Medicine

## 2016-04-17 VITALS — BP 118/66 | HR 63 | Wt 208.0 lb

## 2016-04-17 DIAGNOSIS — F329 Major depressive disorder, single episode, unspecified: Secondary | ICD-10-CM | POA: Diagnosis not present

## 2016-04-17 DIAGNOSIS — Z1211 Encounter for screening for malignant neoplasm of colon: Secondary | ICD-10-CM | POA: Diagnosis not present

## 2016-04-17 DIAGNOSIS — K8500 Idiopathic acute pancreatitis without necrosis or infection: Secondary | ICD-10-CM | POA: Diagnosis not present

## 2016-04-17 DIAGNOSIS — R7301 Impaired fasting glucose: Secondary | ICD-10-CM | POA: Diagnosis not present

## 2016-04-17 DIAGNOSIS — K760 Fatty (change of) liver, not elsewhere classified: Secondary | ICD-10-CM

## 2016-04-17 LAB — POCT GLYCOSYLATED HEMOGLOBIN (HGB A1C): Hemoglobin A1C: 5.8

## 2016-04-17 NOTE — Telephone Encounter (Signed)
Call pt: NA FL D score of 0.6 for her liver. This is an indeterminant score. Consider referral to GI for possible biopsy vs monitoring. We can do either. Also we will see if we can get CT of abdomen to look better at the pancreas.

## 2016-04-17 NOTE — Telephone Encounter (Signed)
Pt informed of results and recommendations. She would like to proceed with whatever Dr. Madilyn Fireman would like to do.Theresa Scott [place]

## 2016-04-17 NOTE — Progress Notes (Signed)
Subjective:    CC: IFG  HPI:  IFG - no inc thrist or urination. Eye exam is UTD, done July 3rd.  Hx of gastric bypass.   F/U depression/Anxiety - overall she is doing well. She does complain of having a lot of stress. She says when she gets overwhelmed she and her husband just take a short vacation her go away for a couple days that she can recharge. She has a lot of stressors in her family. She also is very active in helping take care of her grandchildren which I also feels very stressful for her. She otherwise feels like the medication is working well and denies any side effects.she still complains of some sleep issues as well as overeating and feeling nervous and on edge several days of the week. She does feel like she worries excessively.  Follow-up pancreatitis-her symptoms have completely resolved and she is doing much better. She is concerned that one wants to just make sure that there is no underlying pancreatic cancer. She has a brother who was diagnosed with colon cancer at a young age, a sister diagnosed with liver cancer recently and another sibling diagnosed with cirrhosis. Though they felt it was a side effect from medication. Her last: S/P was in 2009.  Past medical history, Surgical history, Family history not pertinant except as noted below, Social history, Allergies, and medications have been entered into the medical record, reviewed, and corrections made.   Review of Systems: No fevers, chills, night sweats, weight loss, chest pain, or shortness of breath.   Objective:    General: Well Developed, well nourished, and in no acute distress.  Neuro: Alert and oriented x3, extra-ocular muscles intact, sensation grossly intact.  HEENT: Normocephalic, atraumatic  Skin: Warm and dry, no rashes. Cardiac: Regular rate and rhythm, no murmurs rubs or gallops, no lower extremity edema.  Respiratory: Clear to auscultation bilaterally. Not using accessory muscles, speaking in full  sentences. Abd: NT, no organomegaly.  Normal BS.    Impression and Recommendations:   IFG - well controlled. Hemoccult A1c of 5.8. Follow-up in 6 months. Continue current regimen.he does have her eye exam scheduled for July 3.  Depression/anxiety-continue with Pristiq. PHQ 9 score of 5. GAD 7 score of 6. Rates her symptoms as not difficult at all.  Pancreatitis-symptoms resolved.Metastron family history of cirrhosis, liver cancer and colon cancer recommend CT for further evaluation.   Nonalcoholic fatty liver disease-NA FL D score of 0.6. This is an indeterminant score. Consider referral to GI for possible biopsy vs monitoring.

## 2016-04-18 DIAGNOSIS — Z79899 Other long term (current) drug therapy: Secondary | ICD-10-CM | POA: Diagnosis not present

## 2016-04-18 DIAGNOSIS — G894 Chronic pain syndrome: Secondary | ICD-10-CM | POA: Diagnosis not present

## 2016-04-18 DIAGNOSIS — Z79891 Long term (current) use of opiate analgesic: Secondary | ICD-10-CM | POA: Diagnosis not present

## 2016-04-18 DIAGNOSIS — M5116 Intervertebral disc disorders with radiculopathy, lumbar region: Secondary | ICD-10-CM | POA: Diagnosis not present

## 2016-04-18 NOTE — Telephone Encounter (Signed)
CT order changed per imaging protocol.

## 2016-04-19 ENCOUNTER — Ambulatory Visit (HOSPITAL_BASED_OUTPATIENT_CLINIC_OR_DEPARTMENT_OTHER)
Admission: RE | Admit: 2016-04-19 | Discharge: 2016-04-19 | Disposition: A | Discharge status: Home / Self Care | Payer: Commercial Managed Care - HMO | Source: Ambulatory Visit | Attending: Family Medicine | Admitting: Family Medicine

## 2016-04-19 DIAGNOSIS — K8500 Idiopathic acute pancreatitis without necrosis or infection: Secondary | ICD-10-CM | POA: Insufficient documentation

## 2016-04-19 DIAGNOSIS — K760 Fatty (change of) liver, not elsewhere classified: Secondary | ICD-10-CM | POA: Diagnosis not present

## 2016-04-19 DIAGNOSIS — K852 Alcohol induced acute pancreatitis without necrosis or infection: Secondary | ICD-10-CM | POA: Diagnosis not present

## 2016-04-19 DIAGNOSIS — I700 Atherosclerosis of aorta: Secondary | ICD-10-CM | POA: Insufficient documentation

## 2016-04-19 DIAGNOSIS — R110 Nausea: Secondary | ICD-10-CM | POA: Insufficient documentation

## 2016-04-19 DIAGNOSIS — R932 Abnormal findings on diagnostic imaging of liver and biliary tract: Secondary | ICD-10-CM | POA: Diagnosis not present

## 2016-04-19 MED ORDER — IOPAMIDOL (ISOVUE-300) INJECTION 61%
100.0000 mL | Freq: Once | INTRAVENOUS | Status: AC | PRN
  Administered 2016-04-19: 100 mL via INTRAVENOUS

## 2016-04-20 ENCOUNTER — Telehealth: Payer: Self-pay | Admitting: Family Medicine

## 2016-04-20 ENCOUNTER — Telehealth: Payer: Self-pay | Admitting: Internal Medicine

## 2016-04-20 ENCOUNTER — Encounter: Payer: Self-pay | Admitting: Family Medicine

## 2016-04-20 DIAGNOSIS — K760 Fatty (change of) liver, not elsewhere classified: Secondary | ICD-10-CM

## 2016-04-20 DIAGNOSIS — I700 Atherosclerosis of aorta: Secondary | ICD-10-CM | POA: Insufficient documentation

## 2016-04-20 NOTE — Telephone Encounter (Signed)
Left message for patient to return my call.

## 2016-04-20 NOTE — Telephone Encounter (Signed)
Please call patient: After taking about it some more I really actually would like her to see a GI specialist for her liver. I'm going to place an order for [facility]. They likely will not be able to see her until August or September. That is perfectly fine. It's not urgent but I do want her to at least have a consult to see whether or not she might need further workup to see if she might Artie have some scarring of the liver.

## 2016-04-20 NOTE — Telephone Encounter (Signed)
Hali Marry, MD Signed Hali Marry, MD 04/20/2016 12:36 PM    Telephone Encounter    Expand All Collapse All   Please call patient: After taking about it some more I really actually would like her to see a GI specialist for her liver. I'm going to place an order for [facility]. They likely will not be able to see her until August or September. That is perfectly fine. It's not urgent but I do want her to at least have a consult to see whether or not she might need further workup to see if she might Artie have some scarring of the liver.

## 2016-04-20 NOTE — Telephone Encounter (Signed)
Pt informed.Theresa Scott pt stated that she has to sign a ROI with GI so they can look back at her previous studies.Theresa Scott

## 2016-04-21 ENCOUNTER — Encounter: Payer: Self-pay | Admitting: Internal Medicine

## 2016-04-24 ENCOUNTER — Telehealth: Payer: Self-pay | Admitting: Internal Medicine

## 2016-04-24 DIAGNOSIS — H5203 Hypermetropia, bilateral: Secondary | ICD-10-CM | POA: Diagnosis not present

## 2016-04-24 DIAGNOSIS — H5210 Myopia, unspecified eye: Secondary | ICD-10-CM | POA: Diagnosis not present

## 2016-04-24 LAB — HM DIABETES EYE EXAM

## 2016-04-24 NOTE — Telephone Encounter (Signed)
Rec'd from [facility] forward 3 pages to Dr.Gessner

## 2016-05-18 ENCOUNTER — Other Ambulatory Visit: Payer: Self-pay | Admitting: Family Medicine

## 2016-05-29 ENCOUNTER — Other Ambulatory Visit: Payer: Self-pay

## 2016-05-29 DIAGNOSIS — M41125 Adolescent idiopathic scoliosis, thoracolumbar region: Secondary | ICD-10-CM

## 2016-05-29 DIAGNOSIS — M5442 Lumbago with sciatica, left side: Secondary | ICD-10-CM

## 2016-05-29 DIAGNOSIS — M961 Postlaminectomy syndrome, not elsewhere classified: Secondary | ICD-10-CM

## 2016-06-07 DIAGNOSIS — G894 Chronic pain syndrome: Secondary | ICD-10-CM | POA: Diagnosis not present

## 2016-06-07 DIAGNOSIS — M5116 Intervertebral disc disorders with radiculopathy, lumbar region: Secondary | ICD-10-CM | POA: Diagnosis not present

## 2016-06-27 ENCOUNTER — Other Ambulatory Visit (INDEPENDENT_AMBULATORY_CARE_PROVIDER_SITE_OTHER): Payer: Commercial Managed Care - HMO

## 2016-06-27 ENCOUNTER — Encounter: Payer: Self-pay | Admitting: Internal Medicine

## 2016-06-27 ENCOUNTER — Ambulatory Visit (INDEPENDENT_AMBULATORY_CARE_PROVIDER_SITE_OTHER): Payer: Commercial Managed Care - HMO | Admitting: Internal Medicine

## 2016-06-27 ENCOUNTER — Encounter (INDEPENDENT_AMBULATORY_CARE_PROVIDER_SITE_OTHER): Payer: Self-pay

## 2016-06-27 VITALS — BP 110/60 | HR 68 | Ht 67.0 in | Wt 205.8 lb

## 2016-06-27 DIAGNOSIS — R110 Nausea: Secondary | ICD-10-CM

## 2016-06-27 DIAGNOSIS — Z9889 Other specified postprocedural states: Secondary | ICD-10-CM

## 2016-06-27 DIAGNOSIS — R740 Nonspecific elevation of levels of transaminase and lactic acid dehydrogenase [LDH]: Secondary | ICD-10-CM | POA: Diagnosis not present

## 2016-06-27 DIAGNOSIS — R932 Abnormal findings on diagnostic imaging of liver and biliary tract: Secondary | ICD-10-CM | POA: Diagnosis not present

## 2016-06-27 DIAGNOSIS — R748 Abnormal levels of other serum enzymes: Secondary | ICD-10-CM

## 2016-06-27 DIAGNOSIS — K760 Fatty (change of) liver, not elsewhere classified: Secondary | ICD-10-CM | POA: Diagnosis not present

## 2016-06-27 DIAGNOSIS — Z9884 Bariatric surgery status: Secondary | ICD-10-CM

## 2016-06-27 DIAGNOSIS — E669 Obesity, unspecified: Secondary | ICD-10-CM

## 2016-06-27 LAB — LIPASE: Lipase: 84 U/L — ABNORMAL HIGH (ref 11.0–59.0)

## 2016-06-27 LAB — PROTIME-INR
INR: 1.1 ratio — ABNORMAL HIGH (ref 0.8–1.0)
Prothrombin Time: 11.3 s (ref 9.6–13.1)

## 2016-06-27 LAB — HEPATIC FUNCTION PANEL
ALT: 38 U/L — ABNORMAL HIGH (ref 0–35)
AST: 44 U/L — ABNORMAL HIGH (ref 0–37)
Albumin: 4.1 g/dL (ref 3.5–5.2)
Alkaline Phosphatase: 93 U/L (ref 39–117)
Bilirubin, Direct: 0.1 mg/dL (ref 0.0–0.3)
Total Bilirubin: 0.3 mg/dL (ref 0.2–1.2)
Total Protein: 7 g/dL (ref 6.0–8.3)

## 2016-06-27 LAB — FERRITIN: Ferritin: 141.4 ng/mL (ref 10.0–291.0)

## 2016-06-27 LAB — AMYLASE: Amylase: 30 U/L (ref 27–131)

## 2016-06-27 NOTE — Progress Notes (Signed)
Referred by: Hali Marry, *  Assessment & Plan:   Encounter Diagnoses  Name Primary?  . Fatty liver Yes  . Abnormal liver CT   . Abnormal transaminases   . Abnormal serum level of lipase   . Nausea without vomiting   . Obesity (BMI 30.0-34.9)   . History of gastric bypass     She has a nodular contour to the border of her liver in some areas that could represent cirrhosis. She is certainly at risk for this given her long history of obesity though that is improved by gastric bypass and the weight loss associated with it. Other less likely possibilities would be hemachromatosis or autoimmune disease perhaps a chronic infection, she is negative for hepatitis C antibody. Fatty liver disease seems most likely. She does not drink.  Her platelets are normal, albumin normal and I see no stigmata of chronic liver disease on physical exam.  Nausea and vomiting she had that the elevated lipase could've been pancreatitis, but would keep in mind some sort of gastroenteritis leading to a rise in her lipase, as it can come from the intestine, and in a patient status post a laparoscopic gastric bypass procedure a transient internal hernia could've caused some sort of obstructive-like phenomenon. She still has some mild nausea which is getting better overall. She is seeing Dr. Lucia Gaskins soon and will be important to see what he thinks about her symptomatology though it does not sound to me like she needs further investigation based upon the CT scanning etc. as far as the bowel loops are concerned it was okay.  Evaluation plans are:  1. Hepatic elastography to determine in a noninvasive way if she might have cirrhosis. 2. LFT, lipase, amylase, Hep B S Ag/ab, HAV total Ab ANA ferritin 3. If she has cirrhosis by elastograophy will then need to consider an upper GI endoscopy to look for varices I've explained this to the patient today 4. She had a screening colonoscopy in September 2008 that was  negative we will place a recall for September 2018. 5. Subsequently discussed w/ Dr. Lucia Gaskins and will arrange an EGD - her lipase is still mildly elevated and amylase NL - w/ her hx bowel source of lipase possible - if EGD negative could need a laparoscopy to look for internal hernia. Other labs show good liver fx overall.  Lab Results  Component Value Date   ALT 38 (H) 06/27/2016   AST 44 (H) 06/27/2016   ALKPHOS 93 06/27/2016   BILITOT 0.3 06/27/2016   Lab Results  Component Value Date   LIPASE 84.0 (H) 06/27/2016   Lab Results  Component Value Date   AMYLASE 30 06/27/2016   Lab Results  Component Value Date   INR 1.1 (H) 06/27/2016   INR 1.0 ratio 02/09/2010     Subjective:    Patient ID: Theresa Scott, female    DOB: May 08, 1956, 60 y.o.   MRN: [id number] Chief complaint: Abnormal liver on CT scan and pancreatitis HPI The patient is a pleasant 60 year old married disabled woman with a recent history of severe nausea and vomiting and epigastric discomfort, a lipase was above 200, ultrasound demonstrated no significant amount she is status post cholecystectomy, a lipase returned to nearly normal soon thereafter, though was mildly elevated at about 74 in June, subsequent CT scan of the abdomen and pelvis because of her suspected pancreatitis demonstrated a nodular contour to the liver suspicious for cirrhosis. She is here for evaluation of that. There  was a history of fatty liver by ultrasound previously. long history of obesity, She is status post laparoscopic Roux-en-Y gastric bypass procedure in 2012 by Dr. Alphonsa Overall, she initially lost from 290-170 pounds and is now back up about 30-35 pounds. She believes the weight gain was related to epidural injections for her back. Hemorrhoids were injected.  No active GI symptoms except for intermittent nausea which is a lot better from 2 months ago but has not completely resolved.  Her sister has a history of cirrhosis and liver cancer,  she has had surgery and perhaps ablation treatments, is followed at [facility]. Currently cancer free after her last intervention.  Mother died of lung cancer 10/15/15 at the age of 84.  The patient does not drink or have other liver disease risk factors.  Allergies  Allergen Reactions  . Bee Venom Anaphylaxis  . Loratadine Itching    REACTION: dizziness  . Nsaids Other (See Comments)    GI intolerance.    . Latex Rash   Outpatient Medications Prior to Visit  Medication Sig Dispense Refill  . amitriptyline (ELAVIL) 25 MG tablet TAKE 1 TO 2 TABLETS AT BEDTIME 180 tablet 1  . baclofen (LIORESAL) 10 MG tablet     . CALCIUM PO Take 2 tablets by mouth daily.    . Cyanocobalamin (VITAMIN B-12 PO) Take 1 tablet by mouth every Wednesday.    Marland Kitchen desvenlafaxine (PRISTIQ) 100 MG 24 hr tablet Take 1 tablet (100 mg total) by mouth daily. 90 tablet 3  . EPINEPHrine 0.3 mg/0.3 mL IJ SOAJ injection Inject 0.3 mLs (0.3 mg total) into the muscle once. 2 Device prn  . gabapentin (NEURONTIN) 300 MG capsule Take 300 mg by mouth daily.    Marland Kitchen ipratropium (ATROVENT) 0.03 % nasal spray Place 2 sprays into both nostrils every 12 (twelve) hours. 30 mL 0  . Multiple Vitamin (MULTIVITAMIN) tablet Take 1 tablet by mouth 2 (two) times daily.     Marland Kitchen nystatin (MYCOSTATIN/NYSTOP) 100000 UNIT/GM POWD Apply 1 g topically 2 (two) times daily as needed. 60 g PRN  . ondansetron (ZOFRAN-ODT) 4 MG disintegrating tablet Take 1 tablet (4 mg total) by mouth every 8 (eight) hours as needed for nausea or vomiting. 20 tablet prn  . metoprolol succinate (TOPROL-XL) 25 MG 24 hr tablet TAKE 1 TABLET EVERY DAY 90 tablet 0  . omeprazole (PRILOSEC) 40 MG capsule Take 1 capsule (40 mg total) by mouth daily. 30 capsule 3   No facility-administered medications prior to visit.    Past Medical History:  Diagnosis Date  . Chronic kidney disease   . Complication of anesthesia   . Degenerative disc disease   . Depression   . Diabetes mellitus     . Fatty liver disease, nonalcoholic   . Gallstones   . GERD (gastroesophageal reflux disease)   . Hypercholesterolemia   . Hypertension   . Kidney stones   . Obesity   . Pancreatitis    Past Surgical History:  Procedure Laterality Date  . BACK SURGERY  1990,2003,2004,2010  . CARDIAC CATHETERIZATION  2011   [facility].   Marland Kitchen CARDIAC SURGERY  2002  . CHOLECYSTECTOMY  1976  . COLONOSCOPY  2008  . ESOPHAGOGASTRODUODENOSCOPY N/A 01/27/2013   Procedure: ESOPHAGOGASTRODUODENOSCOPY (EGD) upper endoscopy;  Surgeon: Shann Medal, MD;  Location: [facility];  Service: General;  Laterality: N/A;  . 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 HAND SURGERY  2000   right  . [place]  History   Social History  . Marital status: Married    Spouse name: N/A  . Number of children: N/A  . Years of education: N/A   Social History Main Topics  . Smoking status: Former Smoker    Packs/day: 0.50    Years: 18.00    Quit date: 08/04/1990  . Smokeless tobacco: Never Used  . Alcohol use No  . Drug use: No  . Sexual activity: Not Asked   Other Topics Concern  . None   Social History Narrative   Married 1 son one daughter   Disabled, chronic back pain, former [place] her.   No alcohol or caffeine tobacco   06/27/2016   Last update   Family History  Problem Relation Age of Onset  . Lung cancer Mother   . Alcohol abuse Mother   . Colon cancer Brother   . Liver cancer Brother   . Alcohol abuse Brother   . Cirrhosis Sister     secondary medication  . Diabetes Sister   . Kidney failure Brother     Dialysis at 38, born single kidney    Review of Systems As per history of present illness. She has headaches chronic back pain she has a nerve stimulator, she has chronic swelling of the feet and legs. All other review of systems are negative at this time.    Objective:   Physical Exam @BP  110/60   Pulse 68   Ht 5\' 7"  (1.702 m)   Wt 205 lb 12.8 oz (93.4 kg)    BMI 32.23 kg/m @  General:  Well-developed, well-nourished and in no acute distress Eyes:  anicteric. ENT:   Mouth and posterior pharynx free of lesions.  Neck:   supple w/o thyromegaly or mass.  Lungs: Clear to auscultation bilaterally. Heart:  S1S2, no rubs, murmurs, gallops. Abdomen:  soft, non-tender, no hepatosplenomegaly, hernia, or mass and BS+.  Rectal: Lymph:  no cervical or supraclavicular adenopathy. Extremities:   no edema, cyanosis or clubbing Skin   no rash. Neuro:  A&O x 3.  Psych:  appropriate mood and  Affect.   Data Reviewed: Primary care notes, I looked at the images of the CT scan with the patient. 2008 colonoscopy reviewed. It was normal and performed by Dr. Rowe Pavy  Normal EGD status post laparoscopic Roux-en-Y gastric bypass 2014 performed by Dr. Santiago Bur in the EMR are reviewed she has normal platelets 1-1/2 times normal maximal transaminases.  Otherwise as per history of present illness.  I appreciate the opportunity to care for this patient. CC: [name],CATHERINE, MD Alphonsa Overall M.D.

## 2016-06-27 NOTE — Patient Instructions (Addendum)
  Your physician has requested that you go to the basement for the lab work before leaving today.   You have been scheduled for an abdominal ultrasound at [facility]  on 07/20/16 at 9:00AM. Please arrive 15 minutes prior to your appointment for registration. Make certain not to have anything to eat or drink 6 hours prior to your appointment. Should you need to reschedule your appointment, please contact radiology at [phone]. This test typically takes about 30 minutes to perform.    I appreciate the opportunity to care for you. Silvano Rusk, MD, [facility]

## 2016-06-28 DIAGNOSIS — Z9884 Bariatric surgery status: Secondary | ICD-10-CM | POA: Diagnosis not present

## 2016-06-28 DIAGNOSIS — R1013 Epigastric pain: Secondary | ICD-10-CM | POA: Diagnosis not present

## 2016-06-28 LAB — HEPATITIS B SURFACE ANTIGEN: Hepatitis B Surface Ag: NEGATIVE

## 2016-06-28 LAB — HEPATITIS A ANTIBODY, TOTAL: Hep A Total Ab: NONREACTIVE

## 2016-06-28 LAB — ANA: Anti Nuclear Antibody(ANA): NEGATIVE

## 2016-06-28 LAB — HEPATITIS B SURFACE ANTIBODY,QUALITATIVE: Hep B S Ab: NEGATIVE

## 2016-06-28 NOTE — Progress Notes (Signed)
Labs ok except lipase still elevated That may be coming from intestine and not pancreas as amylase is normal  Spoke to Dr. Alphonsa Overall and he recommends an EGD so please call her and arrange for EGD re: nausea and vomiting, elevated lipase

## 2016-06-29 ENCOUNTER — Other Ambulatory Visit: Payer: Self-pay

## 2016-06-29 DIAGNOSIS — E669 Obesity, unspecified: Secondary | ICD-10-CM

## 2016-06-29 NOTE — Progress Notes (Signed)
Please arrange EGD re: nausea and vomiting, elevated lipase

## 2016-07-03 ENCOUNTER — Ambulatory Visit (AMBULATORY_SURGERY_CENTER): Payer: Self-pay | Admitting: *Deleted

## 2016-07-03 VITALS — Ht 67.0 in | Wt 208.0 lb

## 2016-07-03 DIAGNOSIS — R112 Nausea with vomiting, unspecified: Secondary | ICD-10-CM

## 2016-07-03 NOTE — Progress Notes (Signed)
No egg or soy allergy. No anesthesia problems.  No home O2.  No diet meds.  

## 2016-07-04 ENCOUNTER — Encounter: Payer: Self-pay | Admitting: Internal Medicine

## 2016-07-04 ENCOUNTER — Ambulatory Visit (AMBULATORY_SURGERY_CENTER): Payer: Commercial Managed Care - HMO | Admitting: Internal Medicine

## 2016-07-04 VITALS — BP 142/79 | HR 63 | Temp 97.7°F | Resp 16 | Ht 67.0 in | Wt 208.0 lb

## 2016-07-04 DIAGNOSIS — R748 Abnormal levels of other serum enzymes: Secondary | ICD-10-CM

## 2016-07-04 DIAGNOSIS — R112 Nausea with vomiting, unspecified: Secondary | ICD-10-CM | POA: Diagnosis not present

## 2016-07-04 DIAGNOSIS — I10 Essential (primary) hypertension: Secondary | ICD-10-CM | POA: Diagnosis not present

## 2016-07-04 DIAGNOSIS — R110 Nausea: Secondary | ICD-10-CM | POA: Diagnosis not present

## 2016-07-04 DIAGNOSIS — E119 Type 2 diabetes mellitus without complications: Secondary | ICD-10-CM | POA: Diagnosis not present

## 2016-07-04 MED ORDER — SODIUM CHLORIDE 0.9 % IV SOLN: 500.0000 mL | INTRAVENOUS | Status: DC

## 2016-07-04 NOTE — Patient Instructions (Addendum)
Things look the way they are supposed to be after your surgery. Let's see what Dr. Lucia Gaskins has to say - I will let him know.  I appreciate the opportunity to care for you. Gatha Mayer, MD, FACG   YOU HAD AN ENDOSCOPIC PROCEDURE TODAY AT [facility]:   Refer to the procedure report that was given to you for any specific questions about what was found during the examination.  If the procedure report does not answer your questions, please call your gastroenterologist to clarify.  If you requested that your care partner not be given the details of your procedure findings, then the procedure report has been included in a sealed envelope for you to review at your convenience later.  YOU SHOULD EXPECT: Some feelings of bloating in the abdomen. Passage of more gas than usual.  Walking can help get rid of the air that was put into your GI tract during the procedure and reduce the bloating. If you had a lower endoscopy (such as a colonoscopy or flexible sigmoidoscopy) you may notice spotting of blood in your stool or on the toilet paper. If you underwent a bowel prep for your procedure, you may not have a normal bowel movement for a few days.  Please Note:  You might notice some irritation and congestion in your nose or some drainage.  This is from the oxygen used during your procedure.  There is no need for concern and it should clear up in a day or so.  SYMPTOMS TO REPORT IMMEDIATELY:   Following lower endoscopy (colonoscopy or flexible sigmoidoscopy):  Excessive amounts of blood in the stool  Significant tenderness or worsening of abdominal pains  Swelling of the abdomen that is new, acute  Fever of 100F or higher   Following upper endoscopy (EGD)  Vomiting of blood or coffee ground material  New chest pain or pain under the shoulder blades  Painful or persistently difficult swallowing  New shortness of breath  Fever of 100F or higher  Black, tarry-looking  stools  For urgent or emergent issues, a gastroenterologist can be reached at any hour by calling [phone].   DIET:  We do recommend a small meal at first, but then you may proceed to your regular diet.  Drink plenty of fluids but you should avoid alcoholic beverages for 24 hours.  ACTIVITY:  You should plan to take it easy for the rest of today and you should NOT DRIVE or use heavy machinery until tomorrow (because of the sedation medicines used during the test).    FOLLOW UP: Our staff will call the number listed on your records the next business day following your procedure to check on you and address any questions or concerns that you may have regarding the information given to you following your procedure. If we do not reach you, we will leave a message.  However, if you are feeling well and you are not experiencing any problems, there is no need to return our call.  We will assume that you have returned to your regular daily activities without incident.  If any biopsies were taken you will be contacted by phone or by letter within the next 1-3 weeks.  Please call us at [phone] if you have not heard about the biopsies in 3 weeks.    SIGNATURES/CONFIDENTIALITY: You and/or your care partner have signed paperwork which will be entered into your electronic medical record.  These signatures attest to the fact that  that the information above on your After Visit Summary has been reviewed and is understood.  Full responsibility of the confidentiality of this discharge information lies with you and/or your care-partner.   Dr. Carlean Purl sent Dr. Lucia Gaskins a message.  Dr. Lucia Gaskins is considering a diagnostic laparoscopy he believes. You may resume your current medications today. Please call if any questions or concerns.

## 2016-07-04 NOTE — Op Note (Signed)
[facility] Patient Name: Theresa Scott Procedure Date: 07/04/2016 4:12 PM MRN: [id number] Endoscopist: Gatha Mayer , MD Age: 60 Referring MD:  Date of Birth: July 13, 1956 Gender: Female Account #: [id number] Procedure:                Upper GI endoscopy Indications:              Nausea, elevated lipase, s/p bariatric surgery ?                            internal hernia problems Medicines:                Propofol per Anesthesia, Monitored Anesthesia Care Procedure:                Pre-Anesthesia Assessment:                           - Prior to the procedure, a History and Physical                            was performed, and patient medications and                            allergies were reviewed. The patient's tolerance of                            previous anesthesia was also reviewed. The risks                            and benefits of the procedure and the sedation                            options and risks were discussed with the patient.                            All questions were answered, and informed consent                            was obtained. Prior Anticoagulants: The patient has                            taken no previous anticoagulant or antiplatelet                            agents. ASA Grade Assessment: II - A patient with                            mild systemic disease. After reviewing the risks                            and benefits, the patient was deemed in                            satisfactory condition to undergo the procedure.  After obtaining informed consent, the endoscope was                            passed under direct vision. Throughout the                            procedure, the patient's blood pressure, pulse, and                            oxygen saturations were monitored continuously. The                            Model GIF-HQ190 [id number]) scope was introduced                            through  the mouth, and advanced to the afferent and                            efferent jejunal loops. The upper GI endoscopy was                            accomplished without difficulty. The patient                            tolerated the procedure well. Scope In: Scope Out: Findings:                 The examined esophagus was normal.                           Evidence of a gastric bypass was found. A gastric                            pouch with a 8 cm length from the GE junction to                            the gastrojejunal anastomosis was found (36-44 CM).                            The gastrojejunal anastomosis was characterized by                            healthy appearing mucosa. This was traversed. The                            pouch-to-jejunum limb was characterized by healthy                            appearing mucosa.                           The cardia and gastric fundus were normal on                            retroflexion. Complications:  No immediate complications. Estimated Blood Loss:     Estimated blood loss: none. Impression:               - Normal esophagus.                           - Roux-en-Y gastrojejunostomy with gastrojejunal                            anastomosis characterized by healthy appearing                            mucosa.                           - No specimens collected.                           -                           HEALTHY AND NORMAL-APPEARING EXAM S/P LAP GASTRIC                            BYPASS W/ ROUX-EN-Y Recommendation:           - Patient has a contact number available for                            emergencies. The signs and symptoms of potential                            delayed complications were discussed with the                            patient. Return to normal activities tomorrow.                            Written discharge instructions were provided to the                            patient.                            - Resume previous diet.                           - Continue present medications.                           - Will let Dr. Lucia Gaskins know results - he is                            considering a diagnostic laparoscopy I believe -                            re: could her problems be coming from an internal  hernia. Gatha Mayer, MD 07/04/2016 4:55:31 PM This report has been signed electronically. CC Letter to:             Alphonsa Overall, MD

## 2016-07-04 NOTE — Progress Notes (Signed)
No problems noted in the recovery room. maw 

## 2016-07-04 NOTE — Progress Notes (Signed)
Patient awakening,vss,report to rn 

## 2016-07-05 ENCOUNTER — Telehealth: Payer: Self-pay | Admitting: *Deleted

## 2016-07-05 NOTE — Telephone Encounter (Signed)
  Follow up Call-  Call back number 07/04/2016  Post procedure Call Back phone  # [phone]  Permission to leave phone message Yes  Some recent data might be hidden     Patient questions:  Do you have a fever, pain , or abdominal swelling? No. Pain Score  0 *  Have you tolerated food without any problems? Yes.    Have you been able to return to your normal activities? Yes.    Do you have any questions about your discharge instructions: Diet   No. Medications  No. Follow up visit  No.  Do you have questions or concerns about your Care? No.  Actions: * If pain score is 4 or above: No action needed, pain <4.

## 2016-07-14 ENCOUNTER — Other Ambulatory Visit: Payer: Self-pay

## 2016-07-14 MED ORDER — DESVENLAFAXINE SUCCINATE ER 100 MG PO TB24: 100.0000 mg | ORAL_TABLET | Freq: Every day | ORAL | 3 refills | Status: DC

## 2016-07-19 ENCOUNTER — Telehealth: Payer: Self-pay | Admitting: *Deleted

## 2016-07-19 NOTE — Telephone Encounter (Signed)
Patient states the pristiq hasnot been ordered yet. Looked in patient chart and it looks like it was sent to a pharmacy. We also received PA noticed from pharm. Patient gets the pristiq from [employer] patient assistance. Called customer service # on prescription and placed order. Patient is aware. Order # is [id number]

## 2016-07-20 ENCOUNTER — Ambulatory Visit (HOSPITAL_COMMUNITY)
Admission: RE | Admit: 2016-07-20 | Discharge: 2016-07-20 | Disposition: A | Discharge status: Home / Self Care | Payer: Commercial Managed Care - HMO | Source: Ambulatory Visit | Attending: Internal Medicine | Admitting: Internal Medicine

## 2016-07-20 DIAGNOSIS — Z9049 Acquired absence of other specified parts of digestive tract: Secondary | ICD-10-CM | POA: Insufficient documentation

## 2016-07-20 DIAGNOSIS — R932 Abnormal findings on diagnostic imaging of liver and biliary tract: Secondary | ICD-10-CM | POA: Insufficient documentation

## 2016-07-20 DIAGNOSIS — K760 Fatty (change of) liver, not elsewhere classified: Secondary | ICD-10-CM | POA: Diagnosis not present

## 2016-07-20 DIAGNOSIS — R7989 Other specified abnormal findings of blood chemistry: Secondary | ICD-10-CM | POA: Diagnosis not present

## 2016-07-20 DIAGNOSIS — R110 Nausea: Secondary | ICD-10-CM

## 2016-07-20 DIAGNOSIS — N281 Cyst of kidney, acquired: Secondary | ICD-10-CM | POA: Diagnosis not present

## 2016-07-20 DIAGNOSIS — R748 Abnormal levels of other serum enzymes: Secondary | ICD-10-CM

## 2016-07-20 NOTE — Progress Notes (Signed)
This test shows fibrosis (scarring in the liver) I do not think it is conclusive for cirrhosis but she could have that. If she does her liver is working well at any rate and may continue to do so in the future. I recommend she see me in March or April 2018.

## 2016-08-04 DIAGNOSIS — Z6833 Body mass index (BMI) 33.0-33.9, adult: Secondary | ICD-10-CM | POA: Diagnosis not present

## 2016-08-04 DIAGNOSIS — I10 Essential (primary) hypertension: Secondary | ICD-10-CM | POA: Diagnosis not present

## 2016-08-04 DIAGNOSIS — G894 Chronic pain syndrome: Secondary | ICD-10-CM | POA: Diagnosis not present

## 2016-08-08 ENCOUNTER — Telehealth: Payer: Self-pay | Admitting: *Deleted

## 2016-08-08 NOTE — Telephone Encounter (Signed)
Pt called and lvm asking whether or not she would need a f/u visit for her kidneys. I looked back in her chart and it did not appear that she will need to f/u until 12/17. Advised that she return call to discuss this further.Theresa Scott

## 2016-08-21 ENCOUNTER — Ambulatory Visit (INDEPENDENT_AMBULATORY_CARE_PROVIDER_SITE_OTHER): Payer: Commercial Managed Care - HMO | Admitting: Family Medicine

## 2016-08-21 ENCOUNTER — Encounter: Payer: Self-pay | Admitting: Family Medicine

## 2016-08-21 VITALS — BP 140/70 | HR 70 | Temp 97.6°F | Wt 208.0 lb

## 2016-08-21 DIAGNOSIS — Z23 Encounter for immunization: Secondary | ICD-10-CM

## 2016-08-21 DIAGNOSIS — R350 Frequency of micturition: Secondary | ICD-10-CM | POA: Diagnosis not present

## 2016-08-21 DIAGNOSIS — R81 Glycosuria: Secondary | ICD-10-CM | POA: Diagnosis not present

## 2016-08-21 LAB — POCT URINALYSIS DIPSTICK
Bilirubin, UA: NEGATIVE
Blood, UA: NEGATIVE
Glucose, UA: 100
Ketones, UA: NEGATIVE
Nitrite, UA: NEGATIVE
Protein, UA: NEGATIVE
Spec Grav, UA: 1.015
Urobilinogen, UA: 1
pH, UA: 6

## 2016-08-21 LAB — POCT GLYCOSYLATED HEMOGLOBIN (HGB A1C): Hemoglobin A1C: 5.9

## 2016-08-21 MED ORDER — CIPROFLOXACIN HCL 500 MG PO TABS: 500.0000 mg | ORAL_TABLET | Freq: Two times a day (BID) | ORAL | 0 refills | Status: AC

## 2016-08-21 NOTE — Patient Instructions (Signed)
To: Get an appointment with your back doctor this week. If you have a hard time getting in them please let us know and we may be able to call and assist with the process if needed.

## 2016-08-21 NOTE — Progress Notes (Signed)
Subjective:    CC: Urinary frequency.    HPI:  60 yo female c/o of urinary frequency for About 3 days. She's also had some incontinence. She didn't even know that she had to pee and then just "lost her urine". No blood in the urine though she does say it has looked rusty colored a couple of times. No fevers chills or sweats. She has had some low back pain. In fact she said she was called her back doctor before coming in here today but thought that it might also be related to a urinary tract infection.. Her pain is predominantly over the low left back and radiates around towards her pelvis. She has not had any stool incontinence. In fact she's actually been a little bit constipated. Normally she has about 3 bowel movements a day and says she did not have a bowel movement for almost 4 days until today she had a small 1. She's had prior back surgeries including 1990, 2003, 2004, and 2010.    She would like to get her flu shot today.  Past medical history, Surgical history, Family history not pertinant except as noted below, Social history, Allergies, and medications have been entered into the medical record, reviewed, and corrections made.   Review of Systems: No fevers, chills, night sweats, weight loss, chest pain, or shortness of breath.   Objective:    General: Well Developed, well nourished, and in no acute distress.  Neuro: Alert and oriented x3, extra-ocular muscles intact, sensation grossly intact.  HEENT: Normocephalic, atraumatic  Skin: Warm and dry, no rashes. Cardiac: Regular rate and rhythm, no murmurs rubs or gallops, no lower extremity edema.  Respiratory: Clear to auscultation bilaterally. Not using accessory muscles, speaking in full sentences. Back: no CVA tenderness.  Abd: soft nontender, Normal bowel sounds. No organomegaly. No rebound or guarding.   Impression and Recommendations:   Urinary incontinence, abrupt onset-we'll go ahead and treat for UTI and send urine for  culture for further evaluation. If culture is negative and symptoms persist then needs to see her spine doctor. Sudden incontinence could also be related to her spine. Encouraged her to call us if she leaves here today to get an appointment for this week. If she feels like it's progressing or getting worse then give [place] a call back if she cannot get in in the next few days for an appointment.  Constipation-May need to use MiraLAX to get her bowels moving.  Flu vaccine given.

## 2016-08-23 LAB — URINE CULTURE: Organism ID, Bacteria: NO GROWTH

## 2016-08-25 DIAGNOSIS — G894 Chronic pain syndrome: Secondary | ICD-10-CM | POA: Diagnosis not present

## 2016-08-25 DIAGNOSIS — M545 Low back pain: Secondary | ICD-10-CM | POA: Diagnosis not present

## 2016-08-25 DIAGNOSIS — M5116 Intervertebral disc disorders with radiculopathy, lumbar region: Secondary | ICD-10-CM | POA: Diagnosis not present

## 2016-09-06 ENCOUNTER — Other Ambulatory Visit: Payer: Self-pay | Admitting: Family Medicine

## 2016-09-12 ENCOUNTER — Other Ambulatory Visit: Payer: Self-pay | Admitting: Orthopaedic Surgery

## 2016-09-12 DIAGNOSIS — M5116 Intervertebral disc disorders with radiculopathy, lumbar region: Secondary | ICD-10-CM

## 2016-09-22 ENCOUNTER — Ambulatory Visit
Admission: RE | Admit: 2016-09-22 | Discharge: 2016-09-22 | Disposition: A | Discharge status: Home / Self Care | Payer: Commercial Managed Care - HMO | Source: Ambulatory Visit | Attending: Orthopaedic Surgery | Admitting: Orthopaedic Surgery

## 2016-09-22 DIAGNOSIS — M545 Low back pain: Secondary | ICD-10-CM | POA: Diagnosis not present

## 2016-09-22 DIAGNOSIS — M5116 Intervertebral disc disorders with radiculopathy, lumbar region: Secondary | ICD-10-CM

## 2016-09-25 ENCOUNTER — Encounter: Payer: Self-pay | Admitting: Internal Medicine

## 2016-09-27 DIAGNOSIS — G894 Chronic pain syndrome: Secondary | ICD-10-CM | POA: Diagnosis not present

## 2016-09-28 ENCOUNTER — Telehealth: Payer: Self-pay | Admitting: *Deleted

## 2016-09-28 ENCOUNTER — Ambulatory Visit (INDEPENDENT_AMBULATORY_CARE_PROVIDER_SITE_OTHER): Payer: Commercial Managed Care - HMO

## 2016-09-28 DIAGNOSIS — IMO0001 Reserved for inherently not codable concepts without codable children: Secondary | ICD-10-CM

## 2016-09-28 DIAGNOSIS — M4850XA Collapsed vertebra, not elsewhere classified, site unspecified, initial encounter for fracture: Principal | ICD-10-CM

## 2016-09-28 DIAGNOSIS — M810 Age-related osteoporosis without current pathological fracture: Secondary | ICD-10-CM | POA: Diagnosis not present

## 2016-09-28 DIAGNOSIS — M818 Other osteoporosis without current pathological fracture: Secondary | ICD-10-CM | POA: Diagnosis not present

## 2016-09-28 NOTE — Telephone Encounter (Addendum)
Pt was told that she has a broken vertebrae she will need an UTD bone density prior to the procedure. Called radiology and spoke w/helen and she will call the pt to arrange an appt. Pt informed.  Pt also asked that her pristiq be called in.  Theresa Scott, [place] Crocker

## 2016-09-29 ENCOUNTER — Telehealth: Payer: Self-pay | Admitting: Family Medicine

## 2016-09-29 DIAGNOSIS — S32010A Wedge compression fracture of first lumbar vertebra, initial encounter for closed fracture: Secondary | ICD-10-CM | POA: Diagnosis not present

## 2016-09-29 NOTE — Telephone Encounter (Signed)
Pt called clinic and advised Dr. Patrice Paradise with the [facility] is going to start her on Fosamax. They will also monitor.

## 2016-09-29 NOTE — Telephone Encounter (Signed)
Called and placed order for pristiq.  Customer ID# [id number] Confirmation # [id number] order to arrive in 7-10 days.Theresa Scott [place]

## 2016-10-17 ENCOUNTER — Ambulatory Visit: Payer: Commercial Managed Care - HMO | Admitting: Family Medicine

## 2016-10-24 ENCOUNTER — Encounter: Payer: Self-pay | Admitting: Family Medicine

## 2016-10-24 ENCOUNTER — Ambulatory Visit (INDEPENDENT_AMBULATORY_CARE_PROVIDER_SITE_OTHER): Payer: Commercial Managed Care - HMO | Admitting: Family Medicine

## 2016-10-24 VITALS — BP 139/71 | HR 66 | Ht 67.0 in | Wt 207.0 lb

## 2016-10-24 DIAGNOSIS — Z8781 Personal history of (healed) traumatic fracture: Secondary | ICD-10-CM

## 2016-10-24 DIAGNOSIS — R7301 Impaired fasting glucose: Secondary | ICD-10-CM

## 2016-10-24 DIAGNOSIS — F329 Major depressive disorder, single episode, unspecified: Secondary | ICD-10-CM | POA: Diagnosis not present

## 2016-10-24 LAB — POCT GLYCOSYLATED HEMOGLOBIN (HGB A1C): Hemoglobin A1C: 6.4

## 2016-10-24 NOTE — Progress Notes (Signed)
Subjective:    CC:   HPI:  IFG - No increased thirst or urination. She has been craving sweets. Has been under more stress recently. Her sister who has cirrhosis is not doing well and it likely at the end of her life.  She is a major support for her family.    Depression- Patient comes in today to follow-up on depression. She does complain of feeling down and depressed several days a week and little pleasure in doing things several days of the week. She also complains about feeling nervous and on edge more than half the days and some irritability more than half the days.  Vertebral fracture-she says she's done really well with her vertebroplasty. Has really helped her pain and she is feeling much better. In fact she has a follow-up with Dr. Patrice Paradise later this week. He recommend that she start a medication for her bones.  BP 139/71   Pulse 66   Ht 5\' 7"  (1.702 m)   Wt 207 lb (93.9 kg)   SpO2 97%   BMI 32.42 kg/m     Allergies  Allergen Reactions  . Bee Venom Anaphylaxis  . Loratadine Itching    REACTION: dizziness  . Nsaids Other (See Comments)    GI intolerance.    . Latex Rash    Past Medical History:  Diagnosis Date  . Chronic kidney disease   . Complication of anesthesia   . Degenerative disc disease   . Depression   . Diabetes mellitus    not since bariatric surgery  . Fatty liver disease, nonalcoholic   . Gallstones   . GERD (gastroesophageal reflux disease)   . Hypercholesterolemia   . Hypertension   . Kidney stones   . Obesity   . Pancreatitis     Past Surgical History:  Procedure Laterality Date  . BACK SURGERY  1990,2003,2004,2010  . CARDIAC CATHETERIZATION  2011   [facility].   Marland Kitchen CARDIAC SURGERY  2002   ablation  . CHOLECYSTECTOMY  1976  . COLONOSCOPY  2008  . ESOPHAGOGASTRODUODENOSCOPY N/A 01/27/2013   Procedure: ESOPHAGOGASTRODUODENOSCOPY (EGD) upper endoscopy;  Surgeon: Shann Medal, MD;  Location: [facility];  Service: General;  Laterality:  N/A;  . 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 HAND SURGERY  2000   right  . TONSILLECTOMY  1971    Social History   Social History  . Marital status: Married    Spouse name: N/A  . Number of children: N/A  . Years of education: N/A   Occupational History  . Not on file.   Social History Main Topics  . Smoking status: Former Smoker    Packs/day: 0.50    Years: 18.00    Quit date: 08/04/1990  . Smokeless tobacco: Never Used  . Alcohol use No  . Drug use: No  . Sexual activity: Not on file   Other Topics Concern  . Not on file   Social History Narrative   Married 1 son one daughter   Disabled, chronic back pain, former [place] her.   No alcohol or caffeine tobacco   06/27/2016   Last update    Family History  Problem Relation Age of Onset  . Lung cancer Mother   . Alcohol abuse Mother   . Colon cancer Brother   . Liver cancer Brother   . Alcohol abuse Brother   . Cirrhosis Sister     secondary medication  . Diabetes Sister   . Kidney failure Brother  Dialysis at 20, born single kidney  . Esophageal cancer Neg Hx   . Stomach cancer Neg Hx     Outpatient Encounter Prescriptions as of 10/24/2016  Medication Sig  . amitriptyline (ELAVIL) 25 MG tablet TAKE 1 TO 2 TABLETS AT BEDTIME  . baclofen (LIORESAL) 10 MG tablet   . CALCIUM PO Take 2 tablets by mouth daily.  . Cyanocobalamin (VITAMIN B-12 PO) Take 1 tablet by mouth every Wednesday.  Marland Kitchen desvenlafaxine (PRISTIQ) 100 MG 24 hr tablet Take 1 tablet (100 mg total) by mouth daily.  Mariane Baumgarten Sodium (COLACE PO)   . EPINEPHrine 0.3 mg/0.3 mL IJ SOAJ injection Inject 0.3 mLs (0.3 mg total) into the muscle once.  . gabapentin (NEURONTIN) 300 MG capsule Take 300 mg by mouth daily.  . metoprolol succinate (TOPROL-XL) 25 MG 24 hr tablet TAKE 1 TABLET EVERY DAY  . Multiple Vitamin (MULTIVITAMIN) tablet Take 1 tablet by mouth 2 (two) times daily.   Marland Kitchen nystatin (MYCOSTATIN/NYSTOP) 100000 UNIT/GM POWD Apply 1  g topically 2 (two) times daily as needed.  . ondansetron (ZOFRAN-ODT) 4 MG disintegrating tablet Take 1 tablet (4 mg total) by mouth every 8 (eight) hours as needed for nausea or vomiting.  . Oxycodone HCl 10 MG TABS    Facility-Administered Encounter Medications as of 10/24/2016  Medication  . 0.9 %  sodium chloride infusion       Review of Systems: No fevers, chills, night sweats, weight loss, chest pain, or shortness of breath.   Objective:    General: Well Developed, well nourished, and in no acute distress.  Neuro: Alert and oriented x3, extra-ocular muscles intact, sensation grossly intact.  HEENT: Normocephalic, atraumatic  Skin: Warm and dry, no rashes. Cardiac: Regular rate and rhythm, no murmurs rubs or gallops, no lower extremity edema.  Respiratory: Clear to auscultation bilaterally. Not using accessory muscles, speaking in full sentences.   Impression and Recommendations:   IFG - Hemoglobin A1c jumped up quite briskly to 6.4. Discussed getting back on track with diet and reducing intake of sweets foods which she has been craving mostly at night. Let us see her back in 4 months instead of 6 months to make sure that she is getting back on track. Her weight is stable.  Lab Results  Component Value Date   HGBA1C 6.4 10/24/2016   History of vertebral fracture-she will likely need to start a bisphosphonate. Encouraged her to discuss this with Dr. Patrice Paradise to see if he has a particular recommendation or if there is a certain. That we need to wait after the vertebral plasty to safely start this medication.  Depression - PHQ 9 score of 10 today and got 7 score of 7. Though she rates her symptoms as not difficult.

## 2016-10-25 ENCOUNTER — Telehealth: Payer: Self-pay

## 2016-10-25 DIAGNOSIS — Z79891 Long term (current) use of opiate analgesic: Secondary | ICD-10-CM | POA: Diagnosis not present

## 2016-10-25 DIAGNOSIS — S32010A Wedge compression fracture of first lumbar vertebra, initial encounter for closed fracture: Secondary | ICD-10-CM | POA: Diagnosis not present

## 2016-10-25 DIAGNOSIS — M8088XA Other osteoporosis with current pathological fracture, vertebra(e), initial encounter for fracture: Secondary | ICD-10-CM | POA: Diagnosis not present

## 2016-10-25 DIAGNOSIS — M545 Low back pain: Secondary | ICD-10-CM | POA: Diagnosis not present

## 2016-10-25 DIAGNOSIS — Z79899 Other long term (current) drug therapy: Secondary | ICD-10-CM | POA: Diagnosis not present

## 2016-10-25 DIAGNOSIS — G894 Chronic pain syndrome: Secondary | ICD-10-CM | POA: Diagnosis not present

## 2016-10-25 NOTE — Telephone Encounter (Signed)
Willeen Niece called and states Dr. Starling Manns, MD Address: [facility], [address], [place], [place] [address] Phone: [phone] has added Forteo to her medication list. They are doing a PA on this medication. Added to medication list.

## 2016-11-07 ENCOUNTER — Other Ambulatory Visit: Payer: Self-pay | Admitting: Family Medicine

## 2016-11-07 DIAGNOSIS — Z1321 Encounter for screening for nutritional disorder: Secondary | ICD-10-CM | POA: Diagnosis not present

## 2016-11-07 DIAGNOSIS — R7301 Impaired fasting glucose: Secondary | ICD-10-CM | POA: Diagnosis not present

## 2016-11-08 ENCOUNTER — Encounter: Payer: Self-pay | Admitting: Family Medicine

## 2016-11-08 LAB — COMPLETE METABOLIC PANEL WITH GFR
ALT: 28 U/L (ref 6–29)
AST: 36 U/L — ABNORMAL HIGH (ref 10–35)
Albumin: 3.6 g/dL (ref 3.6–5.1)
Alkaline Phosphatase: 95 U/L (ref 33–130)
BUN: 9 mg/dL (ref 7–25)
CO2: 30 mmol/L (ref 20–31)
Calcium: 9 mg/dL (ref 8.6–10.4)
Chloride: 107 mmol/L (ref 98–110)
Creat: 0.65 mg/dL (ref 0.50–0.99)
GFR, Est African American: >89 mL/min (ref >=60)
GFR, Est Non African American: >89 mL/min (ref >=60)
Glucose, Bld: 113 mg/dL — ABNORMAL HIGH (ref 65–99)
Potassium: 4.1 mmol/L (ref 3.5–5.3)
Sodium: 144 mmol/L (ref 135–146)
Total Bilirubin: 0.6 mg/dL (ref 0.2–1.2)
Total Protein: 6.1 g/dL (ref 6.1–8.1)

## 2016-11-08 LAB — LIPID PANEL
Cholesterol: 117 mg/dL (ref <200)
HDL: 28 mg/dL — ABNORMAL LOW (ref >50)
LDL Cholesterol: 72 mg/dL (ref <100)
Total CHOL/HDL Ratio: 4.2 Ratio (ref <5.0)
Triglycerides: 86 mg/dL (ref <150)
VLDL: 17 mg/dL (ref <30)

## 2016-11-14 ENCOUNTER — Telehealth: Payer: Self-pay | Admitting: *Deleted

## 2016-11-14 NOTE — Telephone Encounter (Signed)
Pt stated that she is tired, and sleepy all the time. She stated that she doesn't feel comfortable driving, she falls asleep talking to people, after taking her son to school she comes home and goes to bed. She just feels like her symptoms are getting worse. appt made 11/06/16.Theresa Scott

## 2016-11-16 ENCOUNTER — Ambulatory Visit: Payer: Commercial Managed Care - HMO | Admitting: Family Medicine

## 2016-11-16 ENCOUNTER — Ambulatory Visit (INDEPENDENT_AMBULATORY_CARE_PROVIDER_SITE_OTHER): Payer: Commercial Managed Care - HMO | Admitting: Family Medicine

## 2016-11-16 ENCOUNTER — Encounter: Payer: Self-pay | Admitting: Family Medicine

## 2016-11-16 VITALS — BP 131/68 | HR 72 | Ht 67.0 in | Wt 203.0 lb

## 2016-11-16 DIAGNOSIS — R0683 Snoring: Secondary | ICD-10-CM

## 2016-11-16 DIAGNOSIS — R5383 Other fatigue: Secondary | ICD-10-CM

## 2016-11-16 DIAGNOSIS — R400 Somnolence: Secondary | ICD-10-CM

## 2016-11-16 DIAGNOSIS — R229 Localized swelling, mass and lump, unspecified: Secondary | ICD-10-CM

## 2016-11-16 DIAGNOSIS — Z1321 Encounter for screening for nutritional disorder: Secondary | ICD-10-CM | POA: Diagnosis not present

## 2016-11-16 NOTE — Progress Notes (Signed)
Subjective:    CC: feeling fatigued.    HPI: she has been feeling fatigued for a couple of months. Says she goes to bed early and sleeps well. She has been falling asleep during the daytime.  Says someone even asked her if she had narcolepsy.  Hx of gastric bypass.  She has been under more stress lately but feels that is better. She does snore  And wake up feeling unrefreshed.  She denies feeling depressed.    She also has a lump on the left anterior shoulder that she noticed about a week ago. She says it feels a little uncomfortable at times. She doesn't remember any injury or trauma.  Past medical history, Surgical history, Family history not pertinant except as noted below, Social history, Allergies, and medications have been entered into the medical record, reviewed, and corrections made.   Review of Systems: No fevers, chills, night sweats, weight loss, chest pain, or shortness of breath.   Objective:    General: Well Developed, well nourished, and in no acute distress.  Neuro: Alert and oriented x3, extra-ocular muscles intact, sensation grossly intact.  HEENT: Normocephalic, atraumatic , Oropharynx is clear, TMs and canals are clear bilaterally. No significant cervical lymphadenopathy. Skin: Warm and dry, no rashes. Cardiac: Regular rate and rhythm, no murmurs rubs or gallops, no lower extremity edema.  Respiratory: Clear to auscultation bilaterally. Not using accessory muscles, speaking in full sentences. ABd: Soft, nontender, normal bowel sounds. Axilla-no significant cervical lymphadenopathy.  On the left anterior shoulder she has an approximately 1 cm irregular, but soft lesion. Mildly tender to palpation no erythema or rash over the skin.   Impression and Recommendations:    Extreme fatigue  - She does have history of gastric bypass and recommend that we check her for mineral deficiencies. Lab slip ordered today. She also has a positive screening with the stop bang  questionnaire with a score of 4. We'll get her scheduled for split night sleep study.  Nodule-unclear if in the skin or just below the surface. Will get an ultrasound for further evaluation to see if it is a more cystic structure or possibly an irregular lipoma.  Snoring - She also has a positive screening with the stop bang questionnaire with a score of 4. We'll get her scheduled for split night sleep study.

## 2016-11-17 LAB — CBC WITH DIFFERENTIAL/PLATELET
Basophils Absolute: 0 cells/uL (ref 0–200)
Basophils Relative: 0 %
Eosinophils Absolute: 126 cells/uL (ref 15–500)
Eosinophils Relative: 2 %
HCT: 43.7 % (ref 35.0–45.0)
Hemoglobin: 14.3 g/dL (ref 11.7–15.5)
Lymphocytes Relative: 28 %
Lymphs Abs: 1764 cells/uL (ref 850–3900)
MCH: 30.2 pg (ref 27.0–33.0)
MCHC: 32.7 g/dL (ref 32.0–36.0)
MCV: 92.4 fL (ref 80.0–100.0)
MPV: 12 fL (ref 7.5–12.5)
Monocytes Absolute: 630 cells/uL (ref 200–950)
Monocytes Relative: 10 %
Neutro Abs: 3780 cells/uL (ref 1500–7800)
Neutrophils Relative %: 60 %
Platelets: 188 10*3/uL (ref 140–400)
RBC: 4.73 MIL/uL (ref 3.80–5.10)
RDW: 12.8 % (ref 11.0–15.0)
WBC: 6.3 10*3/uL (ref 3.8–10.8)

## 2016-11-17 LAB — TSH: TSH: 1.32 mIU/L

## 2016-11-17 LAB — FERRITIN: Ferritin: 161 ng/mL (ref 20–288)

## 2016-11-17 LAB — VITAMIN B12: Vitamin B-12: 828 pg/mL (ref 200–1100)

## 2016-11-17 LAB — VITAMIN D 25 HYDROXY (VIT D DEFICIENCY, FRACTURES): Vit D, 25-Hydroxy: 12 ng/mL — ABNORMAL LOW (ref 30–100)

## 2016-11-17 LAB — RPR

## 2016-11-17 LAB — IRON: Iron: 98 ug/dL (ref 45–160)

## 2016-11-17 LAB — SEDIMENTATION RATE: Sed Rate: 5 mm/hr (ref 0–30)

## 2016-11-17 LAB — MAGNESIUM: Magnesium: 2.1 mg/dL (ref 1.5–2.5)

## 2016-11-17 LAB — FOLATE: Folate: 10.1 ng/mL (ref >5.4)

## 2016-11-20 ENCOUNTER — Ambulatory Visit (INDEPENDENT_AMBULATORY_CARE_PROVIDER_SITE_OTHER): Payer: Medicare HMO

## 2016-11-20 DIAGNOSIS — R229 Localized swelling, mass and lump, unspecified: Secondary | ICD-10-CM

## 2016-11-20 DIAGNOSIS — R2232 Localized swelling, mass and lump, left upper limb: Secondary | ICD-10-CM | POA: Diagnosis not present

## 2016-11-20 LAB — VITAMIN B1: Vitamin B1 (Thiamine): 11 nmol/L (ref 8–30)

## 2016-11-21 LAB — VITAMIN B6: Vitamin B6: 5.1 ng/mL (ref 2.1–21.7)

## 2016-11-24 ENCOUNTER — Other Ambulatory Visit: Payer: Self-pay | Admitting: Family Medicine

## 2016-11-24 DIAGNOSIS — Z1231 Encounter for screening mammogram for malignant neoplasm of breast: Secondary | ICD-10-CM

## 2016-11-27 ENCOUNTER — Ambulatory Visit (INDEPENDENT_AMBULATORY_CARE_PROVIDER_SITE_OTHER): Payer: Medicare HMO | Admitting: Family Medicine

## 2016-11-27 ENCOUNTER — Ambulatory Visit (INDEPENDENT_AMBULATORY_CARE_PROVIDER_SITE_OTHER): Payer: Medicare HMO

## 2016-11-27 VITALS — BP 155/89 | HR 97 | Temp 98.2°F | Wt 205.0 lb

## 2016-11-27 DIAGNOSIS — R05 Cough: Secondary | ICD-10-CM | POA: Diagnosis not present

## 2016-11-27 DIAGNOSIS — I700 Atherosclerosis of aorta: Secondary | ICD-10-CM | POA: Diagnosis not present

## 2016-11-27 DIAGNOSIS — J40 Bronchitis, not specified as acute or chronic: Secondary | ICD-10-CM

## 2016-11-27 MED ORDER — BENZONATATE 200 MG PO CAPS: 200.0000 mg | ORAL_CAPSULE | Freq: Three times a day (TID) | ORAL | 0 refills | Status: DC | PRN

## 2016-11-27 MED ORDER — GUAIFENESIN-CODEINE 100-10 MG/5ML PO SOLN: 5.0000 mL | Freq: Every evening | ORAL | 0 refills | Status: DC | PRN

## 2016-11-27 NOTE — Progress Notes (Signed)
Theresa Scott is a 61 y.o. female who presents to [facility] today for sore throat cough congestion chills body aches. Symptoms present for 2 days. Patient has used over-the-counter Alka-Seltzer which helps. She denies vomiting or diarrhea chest pain or palpitations.   Past Medical History:  Diagnosis Date  . Chronic kidney disease   . Complication of anesthesia   . Degenerative disc disease   . Depression   . Diabetes mellitus    not since bariatric surgery  . Fatty liver disease, nonalcoholic   . Gallstones   . GERD (gastroesophageal reflux disease)   . Hypercholesterolemia   . Hypertension   . Kidney stones   . Obesity   . Pancreatitis    Past Surgical History:  Procedure Laterality Date  . BACK SURGERY  1990,2003,2004,2010  . CARDIAC CATHETERIZATION  2011   [facility].   Marland Kitchen CARDIAC SURGERY  2002   ablation  . CHOLECYSTECTOMY  1976  . COLONOSCOPY  2008  . ESOPHAGOGASTRODUODENOSCOPY N/A 01/27/2013   Procedure: ESOPHAGOGASTRODUODENOSCOPY (EGD) upper endoscopy;  Surgeon: Shann Medal, MD;  Location: [facility];  Service: General;  Laterality: N/A;  . 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 HAND SURGERY  2000   right  . TONSILLECTOMY  1971   Social History  Substance Use Topics  . Smoking status: Former Smoker    Packs/day: 0.50    Years: 18.00    Quit date: 08/04/1990  . Smokeless tobacco: Never Used  . Alcohol use No   family history includes Alcohol abuse in her brother and mother; Cirrhosis in her sister; Colon cancer in her brother; Diabetes in her sister; Kidney failure in her brother; Liver cancer in her brother; Lung cancer in her mother.  ROS as above:  Medications: Current Outpatient Prescriptions  Medication Sig Dispense Refill  . amitriptyline (ELAVIL) 25 MG tablet TAKE 1 TO 2 TABLETS AT BEDTIME 180 tablet 1  . baclofen (LIORESAL) 10 MG  tablet     . CALCIUM PO Take 2 tablets by mouth daily.    . Cyanocobalamin (VITAMIN B-12 PO) Take 1 tablet by mouth every Wednesday.    Marland Kitchen desvenlafaxine (PRISTIQ) 100 MG 24 hr tablet Take 1 tablet (100 mg total) by mouth daily. 90 tablet 3  . Docusate Sodium (COLACE PO)     . EPINEPHrine 0.3 mg/0.3 mL IJ SOAJ injection Inject 0.3 mLs (0.3 mg total) into the muscle once. 2 Device prn  . gabapentin (NEURONTIN) 300 MG capsule Take 300 mg by mouth daily.    . metoprolol succinate (TOPROL-XL) 25 MG 24 hr tablet TAKE 1 TABLET EVERY DAY 90 tablet 1  . Multiple Vitamin (MULTIVITAMIN) tablet Take 1 tablet by mouth 2 (two) times daily.     Marland Kitchen nystatin (MYCOSTATIN/NYSTOP) 100000 UNIT/GM POWD Apply 1 g topically 2 (two) times daily as needed. 60 g PRN  . ondansetron (ZOFRAN-ODT) 4 MG disintegrating tablet Take 1 tablet (4 mg total) by mouth every 8 (eight) hours as needed for nausea or vomiting. 20 tablet prn  . Oxycodone HCl 10 MG TABS     . Teriparatide, Recombinant, (FORTEO) 600 MCG/2.4ML SOLN Inject 20 mcg into the skin. Prescribed by Dr Rennis Harding.    . benzonatate (TESSALON) 200 MG capsule Take 1 capsule (200 mg total) by mouth 3 (three) times daily as needed for cough. 45 capsule 0  . guaiFENesin-codeine 100-10 MG/5ML syrup Take 5 mLs by mouth at bedtime as  needed for cough. 120 mL 0   Current Facility-Administered Medications  Medication Dose Route Frequency Provider Last Rate Last Dose  . 0.9 %  sodium chloride infusion  500 mL Intravenous Continuous Gatha Mayer, MD       Allergies  Allergen Reactions  . Bee Venom Anaphylaxis  . Loratadine Itching    REACTION: dizziness  . Nsaids Other (See Comments)    GI intolerance.    . Latex Rash    Health Maintenance Health Maintenance  Topic Date Due  . PNEUMOCOCCAL POLYSACCHARIDE VACCINE (2) 10/23/2006  . ZOSTAVAX  04/11/2016  . FOOT EXAM  12/16/2016  . URINE MICROALBUMIN  12/16/2016  . HEMOGLOBIN A1C  04/23/2017  . OPHTHALMOLOGY EXAM   04/24/2017  . COLONOSCOPY  10/23/2017  . MAMMOGRAM  11/16/2017  . DEXA SCAN  09/28/2018  . PAP SMEAR  12/16/2018  . TETANUS/TDAP  11/18/2025  . INFLUENZA VACCINE  Completed  . Hepatitis C Screening  Completed  . HIV Screening  Completed     Exam:  BP (!) 155/89   Pulse 97   Temp 98.2 F (36.8 C) (Oral)   Wt 205 lb (93 kg)   SpO2 97%   BMI 32.11 kg/m  Gen: Well NAD nontoxic appearing. HEENT: EOMI,  MMM clear nasal discharge. Posterior pharynx with cobblestoning. Mild cervical lymphadenopathy present bilaterally. Lungs: Normal work of breathing. CTABL Heart: RRR no MRG Abd: NABS, Soft. Nondistended, Nontender Exts: Brisk capillary refill, warm and well perfused.    No results found for this or any previous visit (from the past 72 hour(s)). No results found.    Assessment and Plan: 61 y.o. female with cough. Likely viral etiology. Plan for empiric treatment with codeine cough syrup and Tessalon Perles. Plan for watchful waiting. Chest x-ray pending.   Orders Placed This Encounter  Procedures  . DG Chest 2 View    Order Specific Question:   Reason for exam:    Answer:   Cough, assess intra-thoracic pathology    Order Specific Question:   Is the patient pregnant?    Answer:   No    Order Specific Question:   Preferred imaging location?    Answer:   [facility]    Discussed warning signs or symptoms. Please see discharge instructions. Patient expresses understanding.

## 2016-11-27 NOTE — Patient Instructions (Signed)
Thank you for coming in today. Call or go to the emergency room if you get worse, have trouble breathing, have chest pains, or palpitations.  Get the xray today.    Acute Bronchitis, Adult Acute bronchitis is sudden (acute) swelling of the air tubes (bronchi) in the lungs. Acute bronchitis causes these tubes to fill with mucus, which can make it hard to breathe. It can also cause coughing or wheezing. In adults, acute bronchitis usually goes away within 2 weeks. A cough caused by bronchitis may last up to 3 weeks. Smoking, allergies, and asthma can make the condition worse. Repeated episodes of bronchitis may cause further lung problems, such as chronic obstructive pulmonary disease (COPD). What are the causes? This condition can be caused by germs and by substances that irritate the lungs, including:  Cold and flu viruses. This condition is most often caused by the same virus that causes a cold.  Bacteria.  Exposure to tobacco smoke, dust, fumes, and air pollution. What increases the risk? This condition is more likely to develop in people who:  Have close contact with someone with acute bronchitis.  Are exposed to lung irritants, such as tobacco smoke, dust, fumes, and vapors.  Have a weak immune system.  Have a respiratory condition such as asthma. What are the signs or symptoms? Symptoms of this condition include:  A cough.  Coughing up clear, yellow, or green mucus.  Wheezing.  Chest congestion.  Shortness of breath.  A fever.  Body aches.  Chills.  A sore throat. How is this diagnosed? This condition is usually diagnosed with a physical exam. During the exam, your health care provider may order tests, such as chest X-rays, to rule out other conditions. He or she may also:  Test a sample of your mucus for bacterial infection.  Check the level of oxygen in your blood. This is done to check for pneumonia.  Do a chest X-ray or lung function testing to rule out  pneumonia and other conditions.  Perform blood tests. Your health care provider will also ask about your symptoms and medical history. How is this treated? Most cases of acute bronchitis clear up over time without treatment. Your health care provider may recommend:  Drinking more fluids. Drinking more makes your mucus thinner, which may make it easier to breathe.  Taking a medicine for a fever or cough.  Taking an antibiotic medicine.  Using an inhaler to help improve shortness of breath and to control a cough.  Using a cool mist vaporizer or humidifier to make it easier to breathe. Follow these instructions at home: Medicines  Take over-the-counter and prescription medicines only as told by your health care provider.  If you were prescribed an antibiotic, take it as told by your health care provider. Do not stop taking the antibiotic even if you start to feel better. General instructions  Get plenty of rest.  Drink enough fluids to keep your urine clear or pale yellow.  Avoid smoking and secondhand smoke. Exposure to cigarette smoke or irritating chemicals will make bronchitis worse. If you smoke and you need help quitting, ask your health care provider. Quitting smoking will help your lungs heal faster.  Use an inhaler, cool mist vaporizer, or humidifier as told by your health care provider.  Keep all follow-up visits as told by your health care provider. This is important. How is this prevented? To lower your risk of getting this condition again:  Wash your hands often with soap and water.  If soap and water are not available, use hand sanitizer.  Avoid contact with people who have cold symptoms.  Try not to touch your hands to your mouth, nose, or eyes.  Make sure to get the flu shot every year. Contact a health care provider if:  Your symptoms do not improve in 2 weeks of treatment. Get help right away if:  You cough up blood.  You have chest pain.  You have  severe shortness of breath.  You become dehydrated.  You faint or keep feeling like you are going to faint.  You keep vomiting.  You have a severe headache.  Your fever or chills gets worse. This information is not intended to replace advice given to you by your health care provider. Make sure you discuss any questions you have with your health care provider. Document Released: 11/16/2004 Document Revised: 05/03/2016 Document Reviewed: 03/29/2016 Elsevier Interactive Patient Education  2017 [employer].

## 2016-12-04 ENCOUNTER — Encounter: Payer: Self-pay | Admitting: Internal Medicine

## 2016-12-04 ENCOUNTER — Ambulatory Visit (INDEPENDENT_AMBULATORY_CARE_PROVIDER_SITE_OTHER): Payer: Medicare HMO | Admitting: Internal Medicine

## 2016-12-04 VITALS — BP 104/60 | HR 72 | Ht 64.25 in | Wt 204.0 lb

## 2016-12-04 DIAGNOSIS — G43A0 Cyclical vomiting, not intractable: Secondary | ICD-10-CM | POA: Diagnosis not present

## 2016-12-04 DIAGNOSIS — K7400 Hepatic fibrosis, unspecified: Secondary | ICD-10-CM | POA: Insufficient documentation

## 2016-12-04 DIAGNOSIS — K760 Fatty (change of) liver, not elsewhere classified: Secondary | ICD-10-CM | POA: Diagnosis not present

## 2016-12-04 DIAGNOSIS — R1115 Cyclical vomiting syndrome unrelated to migraine: Secondary | ICD-10-CM

## 2016-12-04 NOTE — Progress Notes (Signed)
   Theresa Scott 61 y.o. [birth year] [id number]  Assessment & Plan:   Encounter Diagnoses  Name Primary?  Marland Kitchen NAFLD (nonalcoholic fatty liver disease) Yes  . Liver fibrosis ([place]) - suspected   .  vomiting with nausea and elevated lipase - resolved    At this point she appears to have liver fibrosis possibly, liver is functioning normally. Liquids are normal. Recent EGD did not demonstrate any varices or other pathology s/p bariatric surgery. She has had bariatric surgery so she has done much she can, I advised healthy lifestyle choices, limiting alcohol as not really an issue. I will see her in 1 year sooner if needed otherwise.  CC: [name],CATHERINE, MD Dr. 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 Here for follow-up. She is no longer having abdominal pain and nausea vomiting that she was having in the past. He had a rough time lately, her mother died of presumed metastatic cancer on Christmas day 2017, and then in January a sister with morbid obesity and fatty liver and history of hepatocellular carcinoma died. The patient herself is feeling reasonably well at this time. Medications, allergies, past medical history, past surgical history, family history and social history are reviewed and updated in the EMR.  Review of Systems As above  Objective:   Physical Exam BP 104/60 (BP Location: Left Arm, Patient Position: Sitting, Cuff Size: Normal)   Pulse 72   Ht 5' 4.25" (1.632 m) Comment: height measured without shoes  Wt 204 lb (92.5 kg)   BMI 34.74 kg/m  No acute distress  15 minutes time spent with patient > half in counseling coordination of care

## 2016-12-04 NOTE — Patient Instructions (Addendum)
    Please stay active and live a healthy lifestyle as we discussed.  I anticipate seeing you in 1 year - sooner if needed.  If you get unexpected leg swelling, weight gain, abdominal size increase that could be a sign of fluid retention and need to see Dr. Madilyn Fireman or me.  I appreciate the opportunity to care for you. Gatha Mayer, MD, [facility]

## 2016-12-05 ENCOUNTER — Ambulatory Visit (INDEPENDENT_AMBULATORY_CARE_PROVIDER_SITE_OTHER): Payer: Medicare HMO

## 2016-12-05 DIAGNOSIS — Z1231 Encounter for screening mammogram for malignant neoplasm of breast: Secondary | ICD-10-CM | POA: Diagnosis not present

## 2016-12-05 NOTE — Progress Notes (Signed)
Spoke with patient and told her that her mammogram was normal; she should have one again in one year.

## 2016-12-26 ENCOUNTER — Other Ambulatory Visit: Payer: Self-pay | Admitting: *Deleted

## 2016-12-26 ENCOUNTER — Telehealth: Payer: Self-pay | Admitting: *Deleted

## 2016-12-26 DIAGNOSIS — M5116 Intervertebral disc disorders with radiculopathy, lumbar region: Secondary | ICD-10-CM | POA: Diagnosis not present

## 2016-12-26 DIAGNOSIS — G894 Chronic pain syndrome: Secondary | ICD-10-CM | POA: Diagnosis not present

## 2016-12-26 MED ORDER — DESVENLAFAXINE SUCCINATE ER 100 MG PO TB24: 100.0000 mg | ORAL_TABLET | Freq: Every day | ORAL | 3 refills | Status: DC

## 2016-12-26 NOTE — Telephone Encounter (Signed)
Pt assistance forms completed, copied, scanned and mailed to in the patient's provided envelope to:  [employer] patient assistance program [address] [place] [phone] .Theresa Scott [place]

## 2016-12-26 NOTE — Progress Notes (Signed)
Pt's forms for patient assistance for pristiq completed and to be mailed to   [employer] patient assistance program [address] [place] [phone] .Theresa Scott [place]

## 2017-01-07 ENCOUNTER — Encounter (HOSPITAL_COMMUNITY): Payer: Self-pay | Admitting: Emergency Medicine

## 2017-01-07 ENCOUNTER — Emergency Department (HOSPITAL_COMMUNITY)
Admission: EM | Admit: 2017-01-07 | Discharge: 2017-01-07 | Disposition: A | Discharge status: Home / Self Care | Payer: Medicare HMO | Attending: Emergency Medicine | Admitting: Emergency Medicine

## 2017-01-07 ENCOUNTER — Emergency Department (HOSPITAL_COMMUNITY): Payer: Medicare HMO

## 2017-01-07 DIAGNOSIS — Y999 Unspecified external cause status: Secondary | ICD-10-CM | POA: Insufficient documentation

## 2017-01-07 DIAGNOSIS — N189 Chronic kidney disease, unspecified: Secondary | ICD-10-CM | POA: Insufficient documentation

## 2017-01-07 DIAGNOSIS — S3992XA Unspecified injury of lower back, initial encounter: Secondary | ICD-10-CM | POA: Diagnosis not present

## 2017-01-07 DIAGNOSIS — E1140 Type 2 diabetes mellitus with diabetic neuropathy, unspecified: Secondary | ICD-10-CM | POA: Diagnosis not present

## 2017-01-07 DIAGNOSIS — M25532 Pain in left wrist: Secondary | ICD-10-CM | POA: Insufficient documentation

## 2017-01-07 DIAGNOSIS — Y9389 Activity, other specified: Secondary | ICD-10-CM | POA: Diagnosis not present

## 2017-01-07 DIAGNOSIS — W19XXXA Unspecified fall, initial encounter: Secondary | ICD-10-CM | POA: Diagnosis not present

## 2017-01-07 DIAGNOSIS — E1122 Type 2 diabetes mellitus with diabetic chronic kidney disease: Secondary | ICD-10-CM | POA: Insufficient documentation

## 2017-01-07 DIAGNOSIS — M545 Low back pain: Secondary | ICD-10-CM | POA: Insufficient documentation

## 2017-01-07 DIAGNOSIS — G8929 Other chronic pain: Secondary | ICD-10-CM | POA: Diagnosis not present

## 2017-01-07 DIAGNOSIS — S6992XA Unspecified injury of left wrist, hand and finger(s), initial encounter: Secondary | ICD-10-CM | POA: Diagnosis not present

## 2017-01-07 DIAGNOSIS — Z87891 Personal history of nicotine dependence: Secondary | ICD-10-CM | POA: Insufficient documentation

## 2017-01-07 DIAGNOSIS — I129 Hypertensive chronic kidney disease with stage 1 through stage 4 chronic kidney disease, or unspecified chronic kidney disease: Secondary | ICD-10-CM | POA: Insufficient documentation

## 2017-01-07 DIAGNOSIS — Y92007 Garden or yard of unspecified non-institutional (private) residence as the place of occurrence of the external cause: Secondary | ICD-10-CM | POA: Insufficient documentation

## 2017-01-07 DIAGNOSIS — Z91040 Latex allergy status: Secondary | ICD-10-CM | POA: Diagnosis not present

## 2017-01-07 NOTE — ED Notes (Signed)
Patient transported to X-ray 

## 2017-01-07 NOTE — Progress Notes (Signed)
Orthopedic Tech Progress Note Patient Details:  Theresa Scott January 29, 1956 [id number]  Ortho Devices Type of Ortho Device: Thumb velcro splint Ortho Device/Splint Location: LUE Ortho Device/Splint Interventions: Ordered, Application   Braulio Bosch 01/07/2017, 6:18 PM

## 2017-01-07 NOTE — Discharge Instructions (Signed)
Continue Ibuprofen and home pain medicines. Take this medicine with food. Use brace as needed for support Use a heating pad for sore muscles - use for 20 minutes several times a day Make follow up appointment with your family doctor Return for worsening symptoms

## 2017-01-07 NOTE — ED Notes (Signed)
Patient Alert and oriented X4. Stable and ambulatory. Patient verbalized understanding of the discharge instructions.  Patient belongings were taken by the patient.  
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
## 2017-01-14 ENCOUNTER — Ambulatory Visit (HOSPITAL_BASED_OUTPATIENT_CLINIC_OR_DEPARTMENT_OTHER): Payer: Medicare HMO | Attending: Family Medicine | Admitting: Internal Medicine

## 2017-01-14 DIAGNOSIS — R0683 Snoring: Secondary | ICD-10-CM

## 2017-01-19 DIAGNOSIS — R0683 Snoring: Secondary | ICD-10-CM

## 2017-01-19 NOTE — Procedures (Signed)
  Patient Name: Theresa Scott, Theresa Scott Date: 01/14/2017 Gender: Female D.O.B: October 10, 1956 Age (years): 60 Referring Provider: Beatrice Lecher Height (inches): 64 Interpreting Physician: Baird Lyons MD, ABSM Weight (lbs): 204 RPSGT: Joni Reining BMI: 35 MRN: [id number] Neck Size: 15.00 CLINICAL INFORMATION Sleep Study Type: NPSG  Indication for sleep study: Excessive Daytime Sleepiness, Fatigue, Snoring  Epworth Sleepiness Score: 18  SLEEP STUDY TECHNIQUE As per the AASM Manual for the Scoring of Sleep and Associated Events v2.3 (April 2016) with a hypopnea requiring 4% desaturations.  The channels recorded and monitored were frontal, central and occipital EEG, electrooculogram (EOG), submentalis EMG (chin), nasal and oral airflow, thoracic and abdominal wall motion, anterior tibialis EMG, snore microphone, electrocardiogram, and pulse oximetry.  MEDICATIONS Medications self-administered by patient taken the night of the study : gabapentin 300 mg, amitriptyline 25 mg, metoprolol 25 mg, oxycodone 10 mg  SLEEP ARCHITECTURE The study was initiated at 9:49:12 PM and ended at 4:31:04 AM.  Sleep onset time was 75.5 minutes and the sleep efficiency was 71.5%. The total sleep time was 287.4 minutes.  Stage REM latency was 80.0 minutes.  The patient spent 1.91% of the night in stage N1 sleep, 72.86% in stage N2 sleep, 13.05% in stage N3 and 12.18% in REM.  Alpha intrusion was absent.  Supine sleep was 35.10%.  RESPIRATORY PARAMETERS The overall apnea/hypopnea index (AHI) was 4.2 per hour. There were 12 total apneas, including 1 obstructive, 11 central and 0 mixed apneas. There were 8 hypopneas and 22 RERAs.  The AHI during Stage REM sleep was 1.7 per hour.  AHI while supine was 7.1 per hour.  The mean oxygen saturation was 93.60%. The minimum SpO2 during sleep was 90.00%.  Moderate snoring was noted during this study.  CARDIAC DATA The 2 lead EKG demonstrated sinus  rhythm. The mean heart rate was 65.41 beats per minute. Other EKG findings include: None.  LEG MOVEMENT DATA The total PLMS were 20 with a resulting PLMS index of 4.18. Associated arousal with leg movement index was 1.0 .  IMPRESSIONS - No significant obstructive sleep apnea occurred during this study (AHI = 4.2/h). - No significant central sleep apnea occurred during this study (CAI = 2.3/h). - The patient had minimal or no oxygen desaturation during the study (Min O2 = 90.00%) - The patient snored with Moderate snoring volume. - No cardiac abnormalities were noted during this study. - Clinically significant periodic limb movements did not occur during sleep. No significant associated arousals.  DIAGNOSIS - Primary Snoring (786.09 [R06.83 ICD-10])  RECOMMENDATIONS - Patient primarily complains of excessive daytime somnolence. Consider role of medications with sedative/ analgesic effect as listed above, taken on this study night. - Sleep hygiene should be reviewed to assess factors that may improve sleep quality. - Weight management and regular exercise should be initiated or continued if appropriate.  [Electronically signed] 01/19/2017 10:45 AM  Baird Lyons MD, ABSM Diplomate, American Board of Sleep Medicine   NPI: [id number]  [place], American Board of Sleep Medicine  ELECTRONICALLY SIGNED ON:  01/19/2017, 10:40 AM [place] PH: [phone]   FX: [phone] [place]

## 2017-01-25 ENCOUNTER — Other Ambulatory Visit: Payer: Self-pay | Admitting: Family Medicine

## 2017-01-29 ENCOUNTER — Other Ambulatory Visit: Payer: Self-pay | Admitting: Family Medicine

## 2017-02-01 DIAGNOSIS — G894 Chronic pain syndrome: Secondary | ICD-10-CM | POA: Diagnosis not present

## 2017-02-01 DIAGNOSIS — M545 Low back pain: Secondary | ICD-10-CM | POA: Diagnosis not present

## 2017-02-01 DIAGNOSIS — M5116 Intervertebral disc disorders with radiculopathy, lumbar region: Secondary | ICD-10-CM | POA: Diagnosis not present

## 2017-02-12 IMAGING — CT CT ABD-PELV W/ CM
2 of 5 series · 15 of 46 positions shown, 17 images · IV contrast (APPLIED)
Comparison: None.

CLINICAL DATA: Nausea. Diagnosis of non alcoholic pancreatitis
approximately 2 weeks ago. History of gastric bypass, hepatic
steatosis and cholecystectomy.

EXAM:
CT ABDOMEN AND PELVIS WITH CONTRAST
TECHNIQUE: Multidetector CT imaging of the abdomen and pelvis was performed
using the standard protocol following bolus administration of
intravenous contrast.
CONTRAST:  100mL IGX82C-0UU IOPAMIDOL ([id number]-[id number]) INJECTION 61%

[Series 2: axial (person_name) (person_name) · axial · 0.98mm/px · z∈[-220,+190]mm · 12 of 94 slices shown, 14 images]
[im 6/94  soft-tissue]
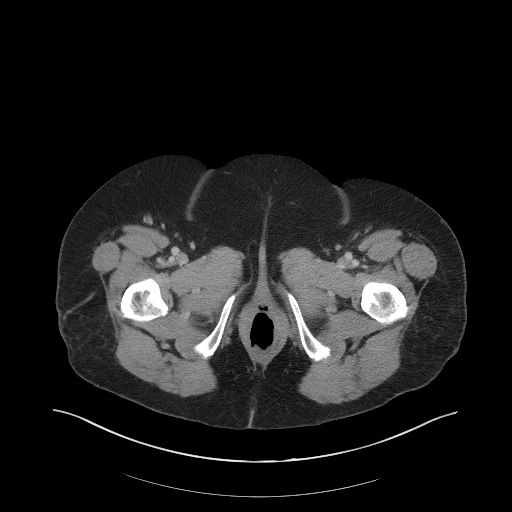
[im 6/94  bone]
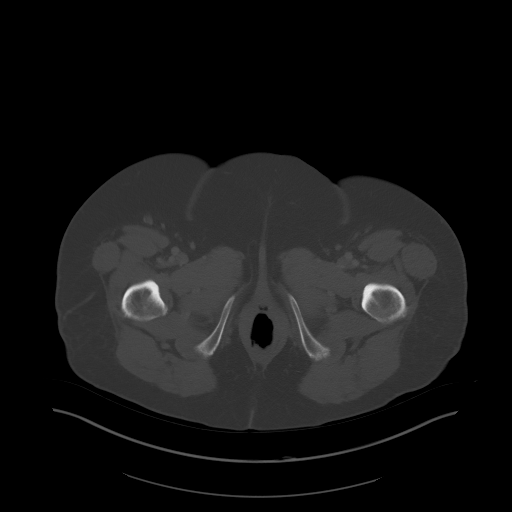
[im 16/94  soft-tissue]
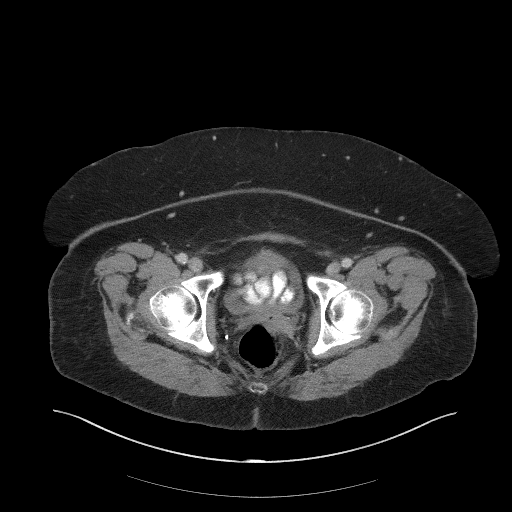
[im 21/94  soft-tissue]
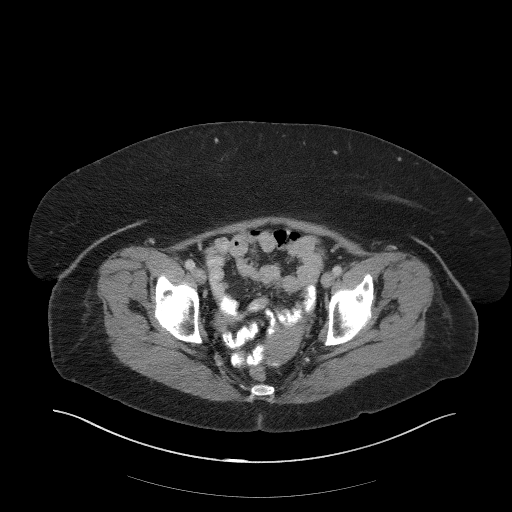
[im 26/94  soft-tissue]
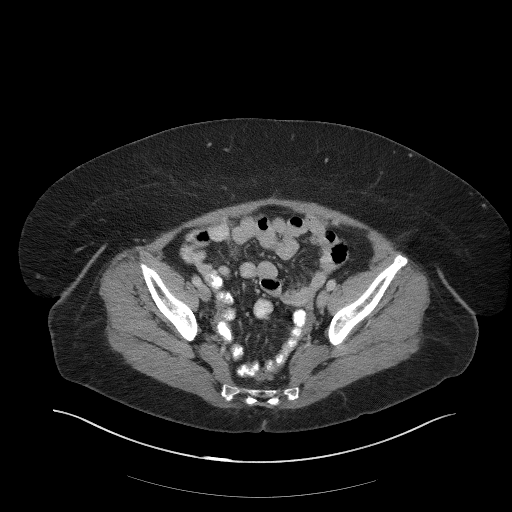
[im 37/94  soft-tissue]
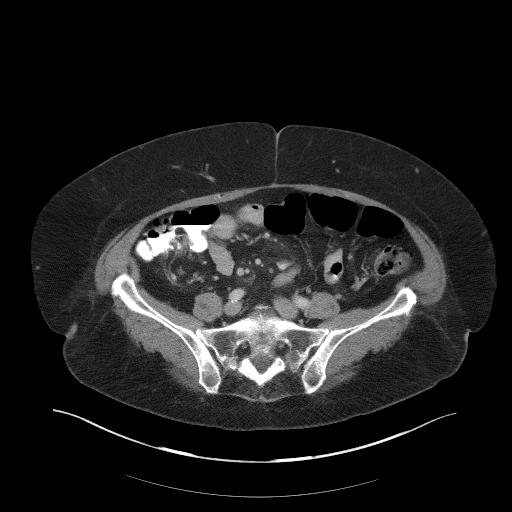
[im 42/94  soft-tissue]
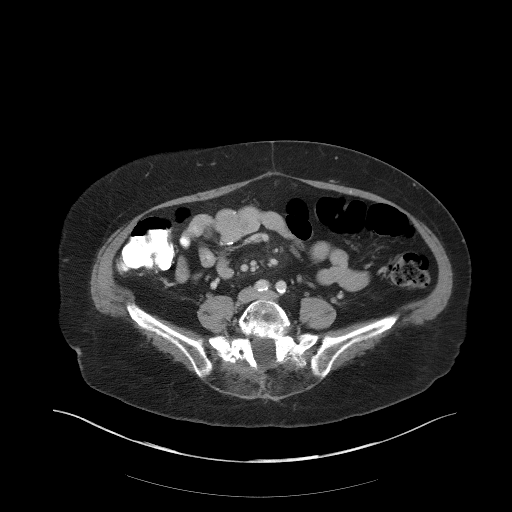
[im 52/94  soft-tissue]
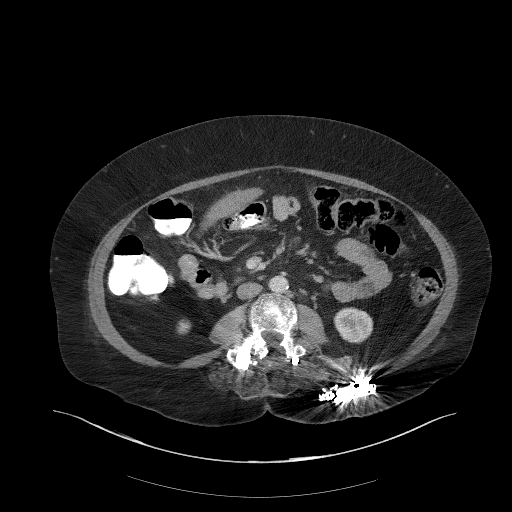
[im 57/94  soft-tissue]
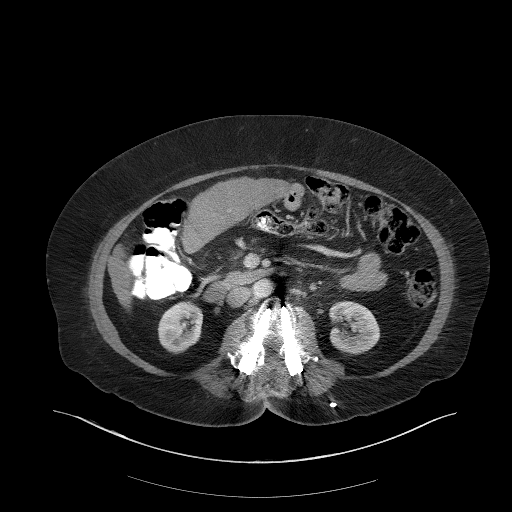
[im 68/94  soft-tissue]
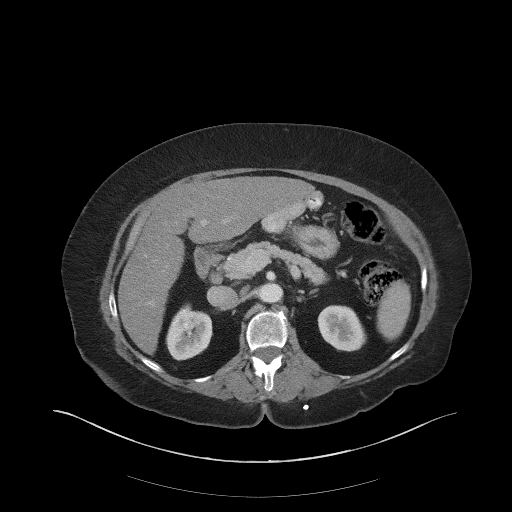
[im 68/94  bone]
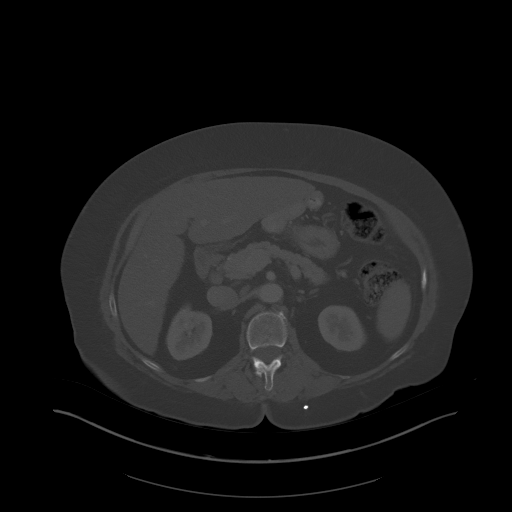
[im 73/94  soft-tissue]
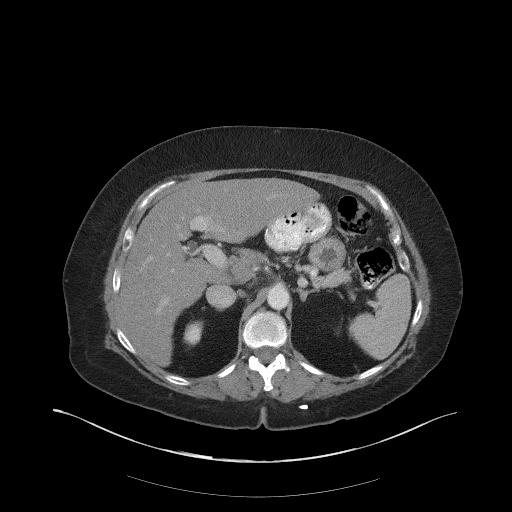
[im 78/94  soft-tissue]
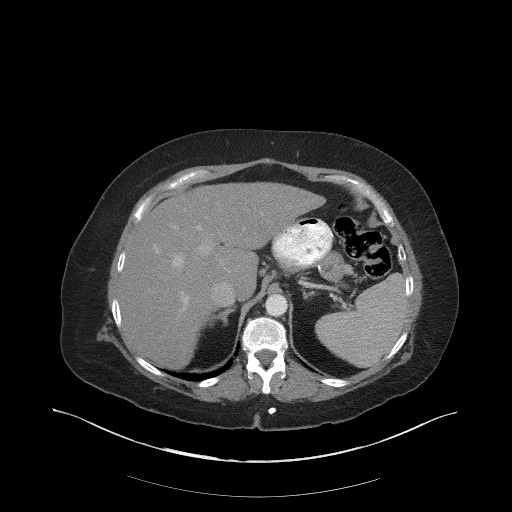
[im 88/94  soft-tissue]
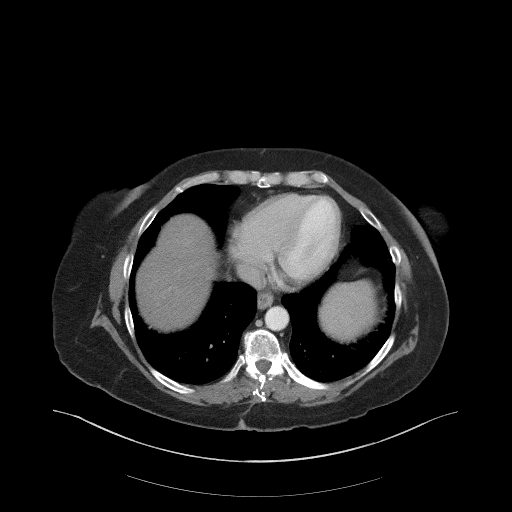

[Series 5: coronal st · coronal · 1.01mm/px · 3 of 96 slices shown]
[im 32/96  soft-tissue]
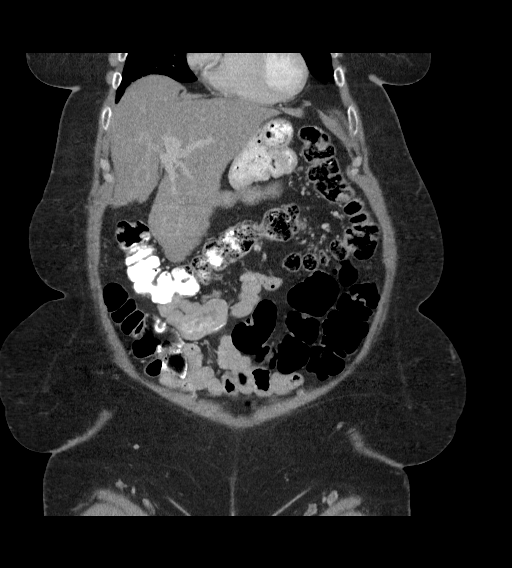
[im 43/96  soft-tissue]
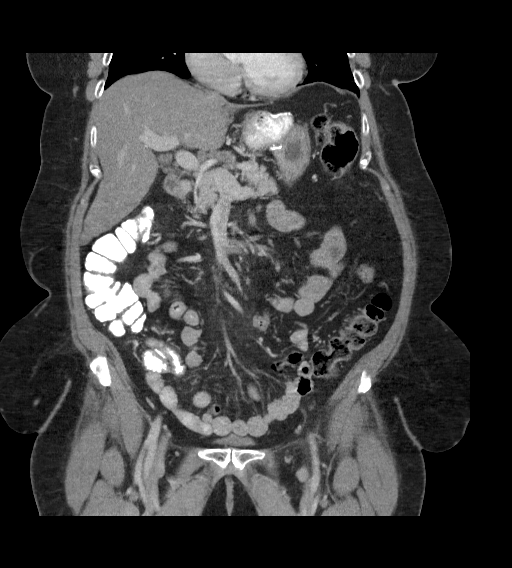
[im 53/96  soft-tissue]
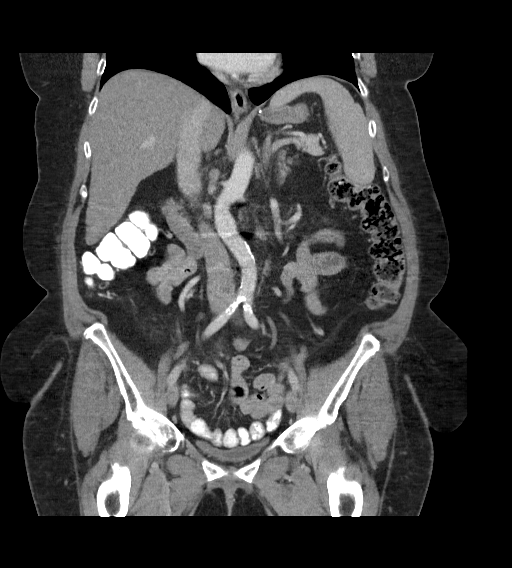

[15 of 46 positions shown; findings below may reference images not displayed]

FINDINGS: Lower chest: Limited visualization of the lower thorax demonstrates
minimal dependent subpleural ground-glass atelectasis. No focal
airspace opacities. No pleural effusion.

Normal heart size.  No pericardial effusion.

Hepatobiliary: Normal hepatic contour. There is mild nodularity of
hepatic contour suggestive of cirrhosis. This finding is associated
with mild diffuse decreased attenuation of the hepatic parenchyma on
this postcontrast examination suggestive of hepatic steatosis. No
discrete hepatic lesions. Post cholecystectomy. No intra
extrahepatic biliary duct dilatation. No ascites.

Pancreas: Normal appearance of the pancreas. No peripancreatic
stranding. There is homogeneous enhancement of the pancreatic
parenchyma. No definitive pancreatic ductal dilatation on this non
pancreatic protocol CT.

Spleen: Normal in appearance.  No evidence splenomegaly.

Adrenals/Urinary Tract: There is symmetric enhancement and excretion
of the bilateral kidneys. Note is made of a punctate (Approximately
2 mm) nonobstructing stone within the posterior interpolar aspect of
the right kidney (image 32, series 2, coronal image 73, series 5).
No definite evidence of left-sided nephrolithiasis on this
postcontrast examination. Bilateral hypo attenuating renal lesions
are too small to adequately characterize though favored to represent
renal cysts. No urinary obstruction or perinephric stranding.

Normal appearance of the bilateral adrenal glands. Normal appearance
of the urinary bladder given degree distention. Several phleboliths
are seen with the lower pelvis bilaterally, left greater than right.

Stomach/Bowel: Sequela of prior gastric bypass. Enteric contrast
extends to the level of the transverse colon. The bowel is otherwise
normal in course and caliber without wall thickening or evidence of
enteric obstruction. The appendix is not visualized, however there
is no pericecal inflammatory change. No pneumoperitoneum,
pneumatosis or portal venous gas.

Vascular/Lymphatic: Scattered atherosclerotic plaque within a
tortuous but normal caliber abdominal aorta. The major branch
vessels of the abdominal aorta appear patent on this non CTA
examination. Note is made of a retro aortic left-sided renal vein.

Reproductive: Normal appearance of the retroverted uterus. No
discrete adnexal lesion. No free fluid within the pelvic cul-de-sac.

Other: Spinal stimulator device is implanted within the soft tissues
about the left buttocks with leads entering the lower thoracic
spinal canal, tips excluded from view.

Musculoskeletal: No acute or aggressive osseous abnormalities. Post
L2-L3 paraspinal fusion and multilevel laminectomy without evidence
of hardware failure or loosening.
IMPRESSION: 1. No explanation for patient's nausea. Specifically, no evidence of
acute or chronic pancreatitis.
2. Sequela of prior gastric bypass surgery without evidence of
enteric obstruction.
3. Nodularity hepatic contour suggestive of hepatic cirrhosis
(6Q07Y-792.71)
4. Minimal amount of aortic atherosclerosis (6Q07Y-170.0) within a
normal caliber abdominal aorta

## 2017-02-21 ENCOUNTER — Ambulatory Visit (INDEPENDENT_AMBULATORY_CARE_PROVIDER_SITE_OTHER): Payer: Medicare HMO | Admitting: Family Medicine

## 2017-02-21 ENCOUNTER — Encounter: Payer: Self-pay | Admitting: Family Medicine

## 2017-02-21 VITALS — BP 130/62 | HR 63 | Ht 64.0 in | Wt 206.0 lb

## 2017-02-21 DIAGNOSIS — R7301 Impaired fasting glucose: Secondary | ICD-10-CM | POA: Diagnosis not present

## 2017-02-21 DIAGNOSIS — R400 Somnolence: Secondary | ICD-10-CM

## 2017-02-21 LAB — POCT GLYCOSYLATED HEMOGLOBIN (HGB A1C): Hemoglobin A1C: 6.1

## 2017-02-21 MED ORDER — METFORMIN HCL ER 500 MG PO TB24: 500.0000 mg | ORAL_TABLET | Freq: Every day | ORAL | 3 refills | Status: DC

## 2017-02-21 NOTE — Progress Notes (Signed)
Subjective:    CC: IFG   HPI:  Impaired fasting glucose-no increased thirst or urination. No symptoms consistent with hypoglycemia.  She hasn't been exercising but she has been trying eat a little better. Her weight is up 2 lbs from last OV.  He has a history of diabetes years ago before her bariatric surgery. Lab Results  Component Value Date   HGBA1C 6.4 10/24/2016   Snoring-she did get the sleep study performed on March 25. It did not show any significant sleep apnea or abnormal sleep movements. I felt that the amitriptyline could be causing some as excess sedation so encouraged her to try stopping it for a week or 2 to see if that helped with her daytime sleepiness.    Past medical history, Surgical history, Family history not pertinant except as noted below, Social history, Allergies, and medications have been entered into the medical record, reviewed, and corrections made.   Review of Systems: No fevers, chills, night sweats, weight loss, chest pain, or shortness of breath.   Objective:    General: Well Developed, well nourished, and in no acute distress.  Neuro: Alert and oriented x3, extra-ocular muscles intact, sensation grossly intact.  HEENT: Normocephalic, atraumatic  Skin: Warm and dry, no rashes. Cardiac: Regular rate and rhythm, no murmurs rubs or gallops, no lower extremity edema.  Respiratory: Clear to auscultation bilaterally. Not using accessory muscles, speaking in full sentences.   Impression and Recommendations:   IFG - A1c is back down to 6.1 which is improvement from 6.4. We discussed going ahead and starting metformin now to prevent her from becoming fully diabetic. We'll follow her up in 4 months. We discussed strategies around getting back to the gym and exercising. It should be great candidate to some water aerobics and she has a lot of back problems and knee problems. Also encouraged at least 10 minutes of walking a day.  Daytime somnolence - we did discuss  that since her sleep study was normal the amitriptyline or gabapentin could actually be causing some excess daytime sedation. It's up to her if she wants to try stopping his medications or even decreasing them to see if that makes a difference.

## 2017-03-01 DIAGNOSIS — M545 Low back pain: Secondary | ICD-10-CM | POA: Diagnosis not present

## 2017-03-01 DIAGNOSIS — M542 Cervicalgia: Secondary | ICD-10-CM | POA: Diagnosis not present

## 2017-03-01 DIAGNOSIS — G894 Chronic pain syndrome: Secondary | ICD-10-CM | POA: Diagnosis not present

## 2017-03-01 DIAGNOSIS — M791 Myalgia: Secondary | ICD-10-CM | POA: Diagnosis not present

## 2017-04-03 ENCOUNTER — Telehealth: Payer: Self-pay | Admitting: *Deleted

## 2017-04-03 NOTE — Telephone Encounter (Signed)
Called and placed order for pristiq.  Customer ID# [id number] Confirmation # [id number] 7-10 days.Theresa Scott

## 2017-04-05 ENCOUNTER — Other Ambulatory Visit: Payer: Self-pay | Admitting: Family Medicine

## 2017-05-10 ENCOUNTER — Other Ambulatory Visit: Payer: Self-pay

## 2017-05-10 MED ORDER — GABAPENTIN 300 MG PO CAPS: 600.0000 mg | ORAL_CAPSULE | Freq: Every day | ORAL | 1 refills | Status: DC

## 2017-05-10 MED ORDER — METFORMIN HCL ER 500 MG PO TB24: 500.0000 mg | ORAL_TABLET | Freq: Every day | ORAL | 1 refills | Status: DC

## 2017-05-10 NOTE — Telephone Encounter (Signed)
Refill request from [facility] for Gabapentin and Metformin. Request have been sent to pharmacy. Ceana Fiala,CMA

## 2017-05-11 ENCOUNTER — Other Ambulatory Visit: Payer: Self-pay

## 2017-05-11 NOTE — Telephone Encounter (Signed)
Another refill request came in for Gabapentin and Metformin but it was completed on 05/10/17 and sent to [facility]. Rhonda Cunningham,CMA

## 2017-06-18 ENCOUNTER — Other Ambulatory Visit: Payer: Self-pay | Admitting: Family Medicine

## 2017-06-20 DIAGNOSIS — Z79891 Long term (current) use of opiate analgesic: Secondary | ICD-10-CM | POA: Diagnosis not present

## 2017-06-20 DIAGNOSIS — M4722 Other spondylosis with radiculopathy, cervical region: Secondary | ICD-10-CM | POA: Diagnosis not present

## 2017-06-20 DIAGNOSIS — G894 Chronic pain syndrome: Secondary | ICD-10-CM | POA: Diagnosis not present

## 2017-06-20 DIAGNOSIS — M542 Cervicalgia: Secondary | ICD-10-CM | POA: Diagnosis not present

## 2017-06-20 DIAGNOSIS — Z79899 Other long term (current) drug therapy: Secondary | ICD-10-CM | POA: Diagnosis not present

## 2017-06-26 ENCOUNTER — Ambulatory Visit: Payer: Medicare HMO | Admitting: Family Medicine

## 2017-06-28 ENCOUNTER — Ambulatory Visit: Payer: Medicare HMO | Admitting: Family Medicine

## 2017-07-05 ENCOUNTER — Other Ambulatory Visit: Payer: Self-pay | Admitting: *Deleted

## 2017-07-05 MED ORDER — GLUCOSE BLOOD VI STRP: ORAL_STRIP | 4 refills | Status: DC

## 2017-07-05 MED ORDER — ACCU-CHEK FASTCLIX LANCETS MISC: 4 refills | Status: DC

## 2017-07-05 MED ORDER — ACCU-CHEK FASTCLIX LANCET KIT: PACK | 0 refills | Status: DC

## 2017-07-05 MED ORDER — ACCU-CHEK SMARTVIEW CONTROL VI LIQD: 99 refills | Status: DC

## 2017-07-05 MED ORDER — BD SWAB SINGLE USE REGULAR PADS: MEDICATED_PAD | 4 refills | Status: DC

## 2017-07-05 MED ORDER — NYSTATIN 100000 UNIT/GM EX POWD: CUTANEOUS | 4 refills | Status: DC

## 2017-07-05 MED ORDER — OMEPRAZOLE 40 MG PO CPDR: 40.0000 mg | DELAYED_RELEASE_CAPSULE | Freq: Every day | ORAL | 3 refills | Status: DC

## 2017-07-05 MED ORDER — ONDANSETRON 4 MG PO TBDP: 4.0000 mg | ORAL_TABLET | Freq: Three times a day (TID) | ORAL | 4 refills | Status: DC | PRN

## 2017-07-05 MED ORDER — ACCU-CHEK NANO SMARTVIEW W/DEVICE KIT: PACK | 0 refills | Status: DC

## 2017-07-10 ENCOUNTER — Other Ambulatory Visit: Payer: Self-pay

## 2017-07-10 MED ORDER — ACCU-CHEK NANO SMARTVIEW W/DEVICE KIT
PACK | 0 refills | Status: DC
Start: 2017-07-10 — End: 2021-11-24

## 2017-07-11 ENCOUNTER — Other Ambulatory Visit: Payer: Self-pay | Admitting: *Deleted

## 2017-07-11 MED ORDER — ACCU-CHEK FASTCLIX LANCET KIT: PACK | 0 refills | Status: DC

## 2017-07-11 MED ORDER — BD SWAB SINGLE USE REGULAR PADS: MEDICATED_PAD | 4 refills | Status: DC

## 2017-07-18 ENCOUNTER — Ambulatory Visit (INDEPENDENT_AMBULATORY_CARE_PROVIDER_SITE_OTHER): Payer: Medicare HMO | Admitting: Family Medicine

## 2017-07-18 ENCOUNTER — Telehealth: Payer: Self-pay | Admitting: *Deleted

## 2017-07-18 ENCOUNTER — Encounter: Payer: Self-pay | Admitting: Family Medicine

## 2017-07-18 VITALS — BP 134/68 | HR 64 | Temp 98.2°F | Ht 64.0 in | Wt 199.0 lb

## 2017-07-18 DIAGNOSIS — R7301 Impaired fasting glucose: Secondary | ICD-10-CM | POA: Diagnosis not present

## 2017-07-18 DIAGNOSIS — Z23 Encounter for immunization: Secondary | ICD-10-CM

## 2017-07-18 DIAGNOSIS — S161XXA Strain of muscle, fascia and tendon at neck level, initial encounter: Secondary | ICD-10-CM

## 2017-07-18 DIAGNOSIS — R519 Headache, unspecified: Secondary | ICD-10-CM

## 2017-07-18 DIAGNOSIS — R51 Headache: Secondary | ICD-10-CM | POA: Diagnosis not present

## 2017-07-18 LAB — POCT GLYCOSYLATED HEMOGLOBIN (HGB A1C): Hemoglobin A1C: 6.2

## 2017-07-18 MED ORDER — KETOROLAC TROMETHAMINE 60 MG/2ML IM SOLN
60.0000 mg | Freq: Once | INTRAMUSCULAR | Status: AC
  Administered 2017-07-18: 60 mg via INTRAMUSCULAR

## 2017-07-18 NOTE — Telephone Encounter (Signed)
Called to place order for Pristiq 100 mg  Customer ID# [id number]  Order to arrive in 7-10 days .Theresa Scott [place]

## 2017-07-18 NOTE — Progress Notes (Signed)
Subjective:    CC: DM  HPI:  Impaired fasting glucose-no increased thirst or urination. No symptoms consistent with hypoglycemia.We decided to start metformin at her last office visit in May since her A1c was 6.1 that she had brought it down from 6.4. She's been tolerating metformin well. She actually taken it years ago. Unfortunately though for semester skeletal issues she was recently on 21 days of prednisone.  She's also had some persistent headache the last 2 days. About 3 or 4 days ago she got a crick in the right side of her neck. Then started sneezing a lot and now has had a headache that she's woken up with for the last 2 days. Her husband and grandson that lives with her has had more cold type symptoms but also sneezing and headache as well.  Her eye appointment is scheduled for later this week.  Past medical history, Surgical history, Family history not pertinant except as noted below, Social history, Allergies, and medications have been entered into the medical record, reviewed, and corrections made.   Review of Systems: No fevers, chills, night sweats, weight loss, chest pain, or shortness of breath.   Objective:    General: Well Developed, well nourished, and in no acute distress.  Neuro: Alert and oriented x3, extra-ocular muscles intact, sensation grossly intact.  HEENT: Normocephalic, atraumatic, Oropharynx is clear, left TM and canal is clear. Right TM is blocked by cerumen. Mildly tender over the right posterior cervical muscles. Neck with normal flexion and extension.  Skin: Warm and dry, no rashes. Cardiac: Regular rate and rhythm, no murmurs rubs or gallops, no lower extremity edema.  Respiratory: Clear to auscultation bilaterally. Not using accessory muscles, speaking in full sentences.   Impression and Recommendations:    IFG - Fortunately her hemoglobin A1c went up to 7.2. Though she was on prednisone for 3 weeks at the end of August. I discussed still get a  venipuncture A1c to confirm this and then have her follow up in 3 months. Hopefully at that point off the prednisone at all look much better.  Headache-maybe early upper respiratory symptoms that she is also getting some sneezing with it. May also be related to a neck strain. Given Toradol injection today for acute relief. Call if not better within next 2 days.   Cervical strain-recommend using heating pad a couple times a day and working on gentle cervical stretches. Okay to use them have a muscle rub as needed. Recommend avoid NSAIDs since had prior bariatric surgery.  She is also interested in a shingles vaccine. Handout given. Encouraged her to get it at her local pharmacy said she has Medicare if she is interested.

## 2017-08-03 ENCOUNTER — Other Ambulatory Visit: Payer: Self-pay | Admitting: *Deleted

## 2017-08-03 MED ORDER — GLUCOSE BLOOD VI STRP: ORAL_STRIP | 4 refills | Status: DC

## 2017-08-24 DIAGNOSIS — M545 Low back pain: Secondary | ICD-10-CM | POA: Diagnosis not present

## 2017-08-24 DIAGNOSIS — M791 Myalgia, unspecified site: Secondary | ICD-10-CM | POA: Diagnosis not present

## 2017-08-24 DIAGNOSIS — G894 Chronic pain syndrome: Secondary | ICD-10-CM | POA: Diagnosis not present

## 2017-09-06 ENCOUNTER — Encounter: Payer: Self-pay | Admitting: Internal Medicine

## 2017-09-17 ENCOUNTER — Other Ambulatory Visit: Payer: Self-pay | Admitting: Family Medicine

## 2017-10-17 ENCOUNTER — Ambulatory Visit: Payer: Medicare HMO | Admitting: Family Medicine

## 2017-10-17 ENCOUNTER — Other Ambulatory Visit: Payer: Self-pay | Admitting: *Deleted

## 2017-10-17 NOTE — Progress Notes (Signed)
Called to place order for Pristiq 100 mg  Customer ID# [id number]  Order to arrive in 7-10 days

## 2017-10-19 ENCOUNTER — Encounter: Payer: Self-pay | Admitting: Family Medicine

## 2017-10-19 ENCOUNTER — Ambulatory Visit: Payer: Medicare HMO | Admitting: Family Medicine

## 2017-10-19 ENCOUNTER — Encounter: Payer: Self-pay | Admitting: Internal Medicine

## 2017-10-19 VITALS — BP 130/68 | HR 72 | Ht 64.0 in | Wt 195.0 lb

## 2017-10-19 DIAGNOSIS — Z1211 Encounter for screening for malignant neoplasm of colon: Secondary | ICD-10-CM

## 2017-10-19 DIAGNOSIS — R7301 Impaired fasting glucose: Secondary | ICD-10-CM | POA: Diagnosis not present

## 2017-10-19 DIAGNOSIS — K760 Fatty (change of) liver, not elsewhere classified: Secondary | ICD-10-CM | POA: Diagnosis not present

## 2017-10-19 LAB — POCT GLYCOSYLATED HEMOGLOBIN (HGB A1C): Hemoglobin A1C: 6.1

## 2017-10-19 LAB — POCT UA - MICROALBUMIN
Albumin/Creatinine Ratio, Urine, POC: <30
Creatinine, POC: 100 mg/dL
Microalbumin Ur, POC: 10 mg/L

## 2017-10-19 NOTE — Progress Notes (Signed)
Subjective:    CC: glucose  HPI:  Impaired fasting glucose-no increased thirst or urination. No symptoms consistent with hypoglycemia.  Has been logging her blood sugars and has been doing really well.  All of her blood sugars have been under 130.  She has not had any hypoglycemic events.  Nonalcoholic fatty liver disease-she is doing well overall.  She had more recently gained some weight up to 206 pounds back in May but has actually been able to get her weight back down to 195, approximately 11 pounds which is fantastic.  Past medical history, Surgical history, Family history not pertinant except as noted below, Social history, Allergies, and medications have been entered into the medical record, reviewed, and corrections made.   Review of Systems: No fevers, chills, night sweats, weight loss, chest pain, or shortness of breath.   Objective:    General: Well Developed, well nourished, and in no acute distress.  Neuro: Alert and oriented x3, extra-ocular muscles intact, sensation grossly intact.  HEENT: Normocephalic, atraumatic  Skin: Warm and dry, no rashes. Cardiac: Regular rate and rhythm, no murmurs rubs or gallops, no lower extremity edema.  Respiratory: Clear to auscultation bilaterally. Not using accessory muscles, speaking in full sentences.   Impression and Recommendations:    IFG -doing well.  Heme globin A1c looks fantastic at 6.1.  Continue work on [employer] and regular exercise.  She does stay active but did encourage her to make sure she is working out.  Due to recheck lipid panel.  NAFLD -due to recheck liver enzymes.  Continue to work on healthy diet and controlling her weight.  BMI down to 33.  Also due for 10-year repeat colonoscopy.  Referral placed.

## 2017-10-24 ENCOUNTER — Telehealth: Payer: Self-pay | Admitting: Family Medicine

## 2017-10-24 DIAGNOSIS — K760 Fatty (change of) liver, not elsewhere classified: Secondary | ICD-10-CM | POA: Diagnosis not present

## 2017-10-24 DIAGNOSIS — R7301 Impaired fasting glucose: Secondary | ICD-10-CM | POA: Diagnosis not present

## 2017-10-24 LAB — COMPLETE METABOLIC PANEL WITH GFR
AG Ratio: 1.8 (calc) (ref 1.0–2.5)
ALT: 33 U/L — ABNORMAL HIGH (ref 6–29)
AST: 41 U/L — ABNORMAL HIGH (ref 10–35)
Albumin: 4.3 g/dL (ref 3.6–5.1)
Alkaline phosphatase (APISO): 117 U/L (ref 33–130)
BUN: 14 mg/dL (ref 7–25)
CO2: 29 mmol/L (ref 20–32)
Calcium: 9.5 mg/dL (ref 8.6–10.4)
Chloride: 107 mmol/L (ref 98–110)
Creat: 0.72 mg/dL (ref 0.50–0.99)
GFR, Est African American: 105 mL/min/{1.73_m2} (ref >=60)
GFR, Est Non African American: 90 mL/min/{1.73_m2} (ref >=60)
Globulin: 2.4 g/dL (calc) (ref 1.9–3.7)
Glucose, Bld: 106 mg/dL — ABNORMAL HIGH (ref 65–99)
Potassium: 4.6 mmol/L (ref 3.5–5.3)
Sodium: 142 mmol/L (ref 135–146)
Total Bilirubin: 0.8 mg/dL (ref 0.2–1.2)
Total Protein: 6.7 g/dL (ref 6.1–8.1)

## 2017-10-24 LAB — LIPID PANEL
Cholesterol: 122 mg/dL (ref <200)
HDL: 30 mg/dL — ABNORMAL LOW (ref >50)
LDL Cholesterol (Calc): 76 mg/dL (calc)
Non-HDL Cholesterol (Calc): 92 mg/dL (calc) (ref <130)
Total CHOL/HDL Ratio: 4.1 (calc) (ref <5.0)
Triglycerides: 75 mg/dL (ref <150)

## 2017-10-24 NOTE — Telephone Encounter (Signed)
Our office received Pristiq 100mg  TAB x 90  for patient. Patient was notified that prescription was ready to be picked up at the front office.

## 2017-10-29 ENCOUNTER — Telehealth: Payer: Self-pay

## 2017-10-29 NOTE — Telephone Encounter (Signed)
Theresa Scott called and states she and her husband need a letter of medical clearance to take care of a baby. Please advise.

## 2017-10-30 NOTE — Telephone Encounter (Signed)
Can we get more info. Why do they need this?  Who does it need to be addressed to ?  Does it need to include any specific information.

## 2017-10-30 NOTE — Telephone Encounter (Signed)
Their friend had a baby 11 days ago, and the baby was taken by DSS. Antasia and her husband have had the baby since then (1 week). Mateo Flow needs to have a letter incase the parental rights are taken from the parents, then they would be an option for them to adopt. They were advised by DSS to get a letter written incase this goes to court based on their age. Pt reports the baby's "parents are on drugs." The letter really only needs to state that Mateo Flow is in good health and there is nothing medically stopping him from taking care of a child. Kitty states her back Dr had to write the exact same letter for her.

## 2017-10-30 NOTE — Telephone Encounter (Signed)
Okay for patient to pick up letter.

## 2017-10-31 NOTE — Telephone Encounter (Signed)
Pt advised, they will pick up tomorrow.

## 2017-11-02 ENCOUNTER — Other Ambulatory Visit: Payer: Self-pay | Admitting: Family Medicine

## 2017-11-02 DIAGNOSIS — Z1231 Encounter for screening mammogram for malignant neoplasm of breast: Secondary | ICD-10-CM

## 2017-11-05 ENCOUNTER — Other Ambulatory Visit: Payer: Self-pay | Admitting: Family Medicine

## 2017-11-05 ENCOUNTER — Encounter: Payer: Self-pay | Admitting: Internal Medicine

## 2017-11-21 ENCOUNTER — Other Ambulatory Visit: Payer: Self-pay | Admitting: Family Medicine

## 2017-12-04 ENCOUNTER — Emergency Department (HOSPITAL_BASED_OUTPATIENT_CLINIC_OR_DEPARTMENT_OTHER): Payer: Medicare HMO

## 2017-12-04 ENCOUNTER — Encounter (HOSPITAL_BASED_OUTPATIENT_CLINIC_OR_DEPARTMENT_OTHER): Payer: Self-pay | Admitting: Emergency Medicine

## 2017-12-04 ENCOUNTER — Emergency Department (HOSPITAL_BASED_OUTPATIENT_CLINIC_OR_DEPARTMENT_OTHER)
Admission: EM | Admit: 2017-12-04 | Discharge: 2017-12-04 | Disposition: A | Discharge status: Home / Self Care | Payer: Medicare HMO | Attending: Emergency Medicine | Admitting: Emergency Medicine

## 2017-12-04 ENCOUNTER — Other Ambulatory Visit: Payer: Self-pay

## 2017-12-04 DIAGNOSIS — E1140 Type 2 diabetes mellitus with diabetic neuropathy, unspecified: Secondary | ICD-10-CM | POA: Insufficient documentation

## 2017-12-04 DIAGNOSIS — M7732 Calcaneal spur, left foot: Secondary | ICD-10-CM | POA: Diagnosis not present

## 2017-12-04 DIAGNOSIS — I129 Hypertensive chronic kidney disease with stage 1 through stage 4 chronic kidney disease, or unspecified chronic kidney disease: Secondary | ICD-10-CM | POA: Diagnosis not present

## 2017-12-04 DIAGNOSIS — Z87891 Personal history of nicotine dependence: Secondary | ICD-10-CM | POA: Insufficient documentation

## 2017-12-04 DIAGNOSIS — S299XXA Unspecified injury of thorax, initial encounter: Secondary | ICD-10-CM | POA: Diagnosis not present

## 2017-12-04 DIAGNOSIS — N189 Chronic kidney disease, unspecified: Secondary | ICD-10-CM | POA: Diagnosis not present

## 2017-12-04 DIAGNOSIS — W11XXXA Fall on and from ladder, initial encounter: Secondary | ICD-10-CM | POA: Insufficient documentation

## 2017-12-04 DIAGNOSIS — Y929 Unspecified place or not applicable: Secondary | ICD-10-CM | POA: Insufficient documentation

## 2017-12-04 DIAGNOSIS — Z79899 Other long term (current) drug therapy: Secondary | ICD-10-CM | POA: Diagnosis not present

## 2017-12-04 DIAGNOSIS — Y939 Activity, unspecified: Secondary | ICD-10-CM | POA: Insufficient documentation

## 2017-12-04 DIAGNOSIS — R0781 Pleurodynia: Secondary | ICD-10-CM | POA: Diagnosis not present

## 2017-12-04 DIAGNOSIS — S20212A Contusion of left front wall of thorax, initial encounter: Secondary | ICD-10-CM | POA: Insufficient documentation

## 2017-12-04 DIAGNOSIS — M79662 Pain in left lower leg: Secondary | ICD-10-CM | POA: Diagnosis not present

## 2017-12-04 DIAGNOSIS — Y999 Unspecified external cause status: Secondary | ICD-10-CM | POA: Diagnosis not present

## 2017-12-04 DIAGNOSIS — Z7984 Long term (current) use of oral hypoglycemic drugs: Secondary | ICD-10-CM | POA: Insufficient documentation

## 2017-12-04 DIAGNOSIS — S93402A Sprain of unspecified ligament of left ankle, initial encounter: Secondary | ICD-10-CM | POA: Insufficient documentation

## 2017-12-04 DIAGNOSIS — S8992XA Unspecified injury of left lower leg, initial encounter: Secondary | ICD-10-CM | POA: Diagnosis not present

## 2017-12-04 NOTE — ED Notes (Signed)
ED Provider at bedside. 

## 2017-12-04 NOTE — ED Triage Notes (Signed)
Patient states that she was on a ladder last night and fell off a ladder and hurt her left side. The patient reports that she has pain to her left shoulder, ribs, leg and ankle

## 2017-12-04 NOTE — ED Notes (Signed)
Patient transported to X-ray 

## 2017-12-04 NOTE — ED Provider Notes (Signed)
[facility]ER[facility]ENCY DEPARTMENT Provider Note   CSN: [id number] Arrival date & time: 12/04/17  1152     History   Chief Complaint Chief Complaint  Patient presents with  . Fall    HPI Theresa Scott is a 62 y.o. female.  HPI  62 year old female presents with left chest pain and left leg pain after a fall yesterday.  She was on a ladder 2 steps up when the ladder twisted and she fell.  She scraped the left side of her chest on the latter.  She thinks she hit her head on the cement but denies any headache or loss of consciousness.  No weakness or numbness or neck pain.  Pain in her left chest is moderate but no dyspnea.  She is also having pain at her proximal left lower leg and left ankle with some swelling.  Denies specific knee pain.  She had some trouble walking on it yesterday but no trouble today.  No hip pain.  She is on chronic oxycodone and took this prior to arrival.  Past Medical History:  Diagnosis Date  . Chronic kidney disease   . Complication of anesthesia   . Degenerative disc disease   . Depression   . Diabetes mellitus    not since bariatric surgery  . Fatty liver disease, nonalcoholic   . Gallstones   . GERD (gastroesophageal reflux disease)   . Hypercholesterolemia   . Hypertension   . Kidney stones   . Obesity   . Pancreatitis     Patient Active Problem List   Diagnosis Date Noted  . NAFLD (nonalcoholic fatty liver disease) 12/04/2016  . 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 Aortic calcification ([place]) 04/20/2016  . 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 Daytime somnolence 06/26/2013  . Sleep disorder, circadian 06/26/2013  . History of gastric bypass, 08/22/2010. 11/03/2011  . IMPAIRED FASTING GLUCOSE 12/15/2010  . ANXIETY 01/31/2010  . PSVT 12/01/2009  . STASIS DERMATITIS 04/06/2009  . ALLERGIC RHINITIS 08/19/2008  . PERIPHERAL EDEMA 08/12/2008  . DIABETIC PERIPHERAL NEUROPATHY 07/20/2008  . Hyperlipemia  06/18/2008  . OBESITY 06/18/2008  . Major depression, chronic 06/18/2008  . Peripheral vascular disease, unspecified ([place]) 06/18/2008  . GERD 06/18/2008  . OSTEOARTHRITIS 06/18/2008  . INSOMNIA 06/18/2008  . INCONTINENCE 06/18/2008  . PROTEINURIA 06/18/2008  . NEPHROLITHIASIS, HX OF 06/18/2008    Past Surgical History:  Procedure Laterality Date  . BACK SURGERY  1990,2003,2004,2010  . CARDIAC CATHETERIZATION  2011   [facility].   Marland Kitchen CARDIAC SURGERY  2002   ablation  . CHOLECYSTECTOMY  1976  . COLONOSCOPY  2008  . ESOPHAGOGASTRODUODENOSCOPY N/A 01/27/2013   Procedure: ESOPHAGOGASTRODUODENOSCOPY (EGD) upper endoscopy;  Surgeon: Shann Medal, MD;  Location: [facility] END[facility];  Service: General;  Laterality: N/A;  . 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 HAND SURGERY  2000   right  . TONSILLECTOMY  1971    OB History    No data available       Home Medications    Prior to Admission medications   Medication Sig Start Date End Date Taking? Authorizing Provider  ACCU-CHEK FASTCLIX LANCETS MISC For testing blood sugars 1-2 times daily. DX: R73.01 07/05/17   Hali Marry, MD  Alcohol Swabs (B-D SINGLE USE SWABS REGULAR) PADS To be used when testing blood sugars 1-2 daily. DX: R73.01 07/11/17   Hali Marry, MD  amitriptyline (ELAVIL) 25 MG tablet TAKE 1 TO 2 TABLETS AT BEDTIME  11/08/16   Hali Marry, MD  baclofen (LIORESAL) 10 MG tablet  10/05/15   [provider]  Blood Glucose Calibration (ACCU-CHEK SMARTVIEW CONTROL) LIQD To be used to calibrate glucose meter as needed. 07/05/17   Hali Marry, MD  Blood Glucose Monitoring Suppl (ACCU-CHEK NANO SMARTVIEW) w/Device KIT Check fasting blood sugars every morning and once after largest meal of the day a few x a week. DX:R73.01 07/10/17   Hali Marry, MD  CALCIUM PO Take 2 tablets by mouth daily.    [provider]  Cyanocobalamin (VITAMIN B-12 PO) Take 1 tablet by mouth every Wednesday.     [provider]  desvenlafaxine (PRISTIQ) 100 MG 24 hr tablet Take 1 tablet (100 mg total) by mouth daily. 12/26/16   Hali Marry, MD  Docusate Sodium (COLACE PO)  06/21/16   [provider]  EPINEPHrine 0.3 mg/0.3 mL IJ SOAJ injection Inject 0.3 mLs (0.3 mg total) into the muscle once. 12/17/15   Hali Marry, MD  gabapentin (NEURONTIN) 300 MG capsule TAKE 2 CAPSULES (600 MG TOTAL) BY MOUTH DAILY. 11/06/17   Hali Marry, MD  glucose blood test strip Accu-chek smartview test strip For testing blood sugars 2 times daily. DX:R73.01 08/03/17   Hali Marry, MD  metFORMIN (GLUCOPHAGE-XR) 500 MG 24 hr tablet TAKE 1 TABLET (500 MG TOTAL) BY MOUTH DAILY WITH BREAKFAST. 09/18/17   Hali Marry, MD  metoprolol succinate (TOPROL-XL) 25 MG 24 hr tablet TAKE 1 TABLET EVERY DAY 11/22/17   Hali Marry, MD  Multiple Vitamin (MULTIVITAMIN) tablet Take 1 tablet by mouth 2 (two) times daily.     [provider]  nystatin (NYSTATIN) powder Apply as needed to affected areas. 07/05/17   Hali Marry, MD  omeprazole (PRILOSEC) 40 MG capsule Take 1 capsule (40 mg total) by mouth daily. 07/05/17   Hali Marry, MD  ondansetron (ZOFRAN-ODT) 4 MG disintegrating tablet Take 1 tablet (4 mg total) by mouth every 8 (eight) hours as needed for nausea or vomiting. 07/05/17   Hali Marry, MD  Oxycodone HCl 10 MG TABS  08/04/16   [provider]  Teriparatide, Recombinant, (FORTEO) 600 MCG/2.4ML SOLN Inject 20 mcg into the skin. Prescribed by Dr Rennis Harding.    [provider]    Family History Family History  Problem Relation Age of Onset  . Lung cancer Mother   . Alcohol abuse Mother   . Colon cancer Brother   . Liver cancer Brother   . Alcohol abuse Brother   . Cirrhosis Sister        secondary medication  . Diabetes Sister   . Kidney failure Brother        Dialysis at 83, born single kidney  .  Esophageal cancer Neg Hx   . Stomach cancer Neg Hx     Social History Social History   Tobacco Use  . Smoking status: Former Smoker    Packs/day: 0.50    Years: 18.00    Pack years: 9.00    Last attempt to quit: 08/04/1990    Years since quitting: 27.3  . Smokeless tobacco: Never Used  Substance Use Topics  . Alcohol use: No  . Drug use: No     Allergies   Bee venom; Loratadine; Nsaids; and Latex   Review of Systems Review of Systems  Respiratory: Negative for shortness of breath.   Cardiovascular: Positive for chest pain.  Gastrointestinal: Negative for abdominal  pain and vomiting.  Genitourinary: Negative for hematuria.  Musculoskeletal: Positive for arthralgias and joint swelling.  Neurological: Negative for headaches.  All other systems reviewed and are negative.    Physical Exam Updated Vital Signs BP 139/85 (BP Location: Left Arm)   Pulse 69   Temp 99 F (37.2 C) (Oral)   Resp 18   Ht _0  (1.702 m)   Wt 86.2 kg (190 lb)   SpO2 100%   BMI 29.76 kg/m   Physical Exam  Constitutional: She is oriented to person, place, and time. She appears well-developed and well-nourished. No distress.  HENT:  Head: Normocephalic and atraumatic.  Right Ear: External ear normal.  Left Ear: External ear normal.  Nose: Nose normal.  Eyes: Right eye exhibits no discharge. Left eye exhibits no discharge.  Cardiovascular: Normal rate, regular rhythm and normal heart sounds.  Pulmonary/Chest: Effort normal and breath sounds normal. She exhibits tenderness.    Abdominal: Soft. She exhibits no distension. There is no tenderness.  Musculoskeletal:       Left hip: She exhibits normal range of motion and no tenderness.       Left knee: She exhibits normal range of motion. No tenderness found.       Left ankle: She exhibits swelling. She exhibits normal range of motion. Tenderness (mild).       Left lower leg: She exhibits tenderness (proximal, lateral).       Left foot:  There is no tenderness.  Neurological: She is alert and oriented to person, place, and time.  Skin: Skin is warm and dry. She is not diaphoretic.  Nursing note and vitals reviewed.    ED Treatments / Results  Labs (all labs ordered are listed, but only abnormal results are displayed) Labs Reviewed - No data to display  EKG  EKG Interpretation None       Radiology Dg Ribs Unilateral W/chest Left  Result Date: 12/04/2017 CLINICAL DATA:  Fall yesterday, hit left lower ribs.  Rib pain. EXAM: LEFT RIBS AND CHEST - 3+ VIEW COMPARISON:  11/27/2016 FINDINGS: Heart and mediastinal contours are within normal limits. No focal opacities or effusions. No acute bony abnormality. No visible rib fractures or pneumothorax. IMPRESSION: No active cardiopulmonary disease. Electronically Signed   By: Rolm Baptise M.D.   On: 12/04/2017 14:02   Dg Tibia/fibula Left  Result Date: 12/04/2017 CLINICAL DATA:  Pain following fall EXAM: LEFT TIBIA AND FIBULA - 2 VIEW COMPARISON:  Left ankle radiographs December 04, 2017 FINDINGS: Frontal and lateral views were obtained. No evident fracture or dislocation. Joint spaces appear unremarkable. No erosive change. There is a posterior calcaneal spur. IMPRESSION: Posterior calcaneal spur. No fracture or dislocation. No appreciable arthropathic change. Electronically Signed   By: Lowella Grip III M.D.   On: 12/04/2017 14:03   Dg Ankle Complete Left  Result Date: 12/04/2017 CLINICAL DATA:  Pain following fall EXAM: LEFT ANKLE COMPLETE - 3+ VIEW COMPARISON:  None. FINDINGS: Frontal, oblique, and lateral views obtained. There is soft tissue swelling diffusely. There is no fracture or joint effusion. No appreciable joint space narrowing or erosion. The ankle mortise appears intact. There is a posterior calcaneal spur. IMPRESSION: Soft tissue swelling. No demonstrable fracture. Ankle mortise appears intact. There is a posterior calcaneal spur. Electronically Signed   By:  Lowella Grip III M.D.   On: 12/04/2017 14:02    Procedures Procedures (including critical care time)  Medications Ordered in ED Medications - No data to display  Initial Impression / Assessment and Plan / ED Course  I have reviewed the triage vital signs and the nursing notes.  Pertinent labs & imaging results that were available during my care of the patient were reviewed by me and considered in my medical decision making (see chart for details).     Patient has ecchymosis over the chest wall but no crepitus or signs of a fracture or pneumothorax.  No kidney symptoms such as hematuria and her abdominal exam is benign.  She has some mild leg symptoms but x-rays are also unremarkable there.  She hit her head but it appears to be minor and she has no headache, signs of head trauma, or neuro symptoms.  I highly doubt head injury and she is not on a blood thinner.  She is already on oxycodone at home and has an incentive spirometer and was encouraged to use this to help prevent secondary complications such as pneumonia.  Plan for discharge home with return precautions.  Final Clinical Impressions(s) / ED Diagnoses   Final diagnoses:  Contusion of left chest wall, initial encounter  Sprain of left ankle, unspecified ligament, initial encounter    ED Discharge Orders    None       Sherwood Gambler, MD 12/04/17 1551

## 2017-12-10 ENCOUNTER — Other Ambulatory Visit: Payer: Self-pay

## 2017-12-10 ENCOUNTER — Ambulatory Visit (AMBULATORY_SURGERY_CENTER): Payer: Self-pay

## 2017-12-10 VITALS — Ht 67.0 in | Wt 194.0 lb

## 2017-12-10 DIAGNOSIS — Z1211 Encounter for screening for malignant neoplasm of colon: Secondary | ICD-10-CM

## 2017-12-10 DIAGNOSIS — Z800 Family history of malignant neoplasm of digestive organs: Secondary | ICD-10-CM

## 2017-12-10 NOTE — Progress Notes (Signed)
No egg or soy allergy known to patient  No issues with past sedation with any surgeries  or procedures, no intubation problems  No diet pills per patient No home 02 use per patient  No blood thinners per patient  Pt denies issues with constipation  No A fib or A flutter  EMMI video sent to pt's e mail pt declined   

## 2017-12-11 ENCOUNTER — Emergency Department
Admission: EM | Admit: 2017-12-11 | Discharge: 2017-12-11 | Disposition: A | Discharge status: Home / Self Care | Payer: Medicare HMO | Source: Home / Self Care | Attending: Family Medicine | Admitting: Family Medicine

## 2017-12-11 ENCOUNTER — Emergency Department (INDEPENDENT_AMBULATORY_CARE_PROVIDER_SITE_OTHER): Payer: Medicare HMO

## 2017-12-11 ENCOUNTER — Ambulatory Visit (HOSPITAL_BASED_OUTPATIENT_CLINIC_OR_DEPARTMENT_OTHER)
Admission: RE | Admit: 2017-12-11 | Discharge: 2017-12-11 | Disposition: A | Discharge status: Home / Self Care | Payer: Medicare HMO | Source: Ambulatory Visit | Attending: Family Medicine | Admitting: Family Medicine

## 2017-12-11 ENCOUNTER — Other Ambulatory Visit: Payer: Self-pay

## 2017-12-11 ENCOUNTER — Encounter: Payer: Self-pay | Admitting: Internal Medicine

## 2017-12-11 ENCOUNTER — Encounter: Payer: Self-pay | Admitting: *Deleted

## 2017-12-11 DIAGNOSIS — R600 Localized edema: Secondary | ICD-10-CM | POA: Diagnosis not present

## 2017-12-11 DIAGNOSIS — M79662 Pain in left lower leg: Secondary | ICD-10-CM | POA: Diagnosis not present

## 2017-12-11 DIAGNOSIS — M7989 Other specified soft tissue disorders: Secondary | ICD-10-CM | POA: Insufficient documentation

## 2017-12-11 DIAGNOSIS — M25572 Pain in left ankle and joints of left foot: Secondary | ICD-10-CM | POA: Insufficient documentation

## 2017-12-11 DIAGNOSIS — S93602S Unspecified sprain of left foot, sequela: Secondary | ICD-10-CM

## 2017-12-11 DIAGNOSIS — M7732 Calcaneal spur, left foot: Secondary | ICD-10-CM | POA: Diagnosis not present

## 2017-12-11 DIAGNOSIS — S99912A Unspecified injury of left ankle, initial encounter: Secondary | ICD-10-CM | POA: Diagnosis not present

## 2017-12-11 DIAGNOSIS — M79672 Pain in left foot: Secondary | ICD-10-CM | POA: Diagnosis not present

## 2017-12-11 DIAGNOSIS — S93402D Sprain of unspecified ligament of left ankle, subsequent encounter: Secondary | ICD-10-CM | POA: Diagnosis not present

## 2017-12-11 DIAGNOSIS — M79661 Pain in right lower leg: Secondary | ICD-10-CM | POA: Diagnosis not present

## 2017-12-11 DIAGNOSIS — S99922A Unspecified injury of left foot, initial encounter: Secondary | ICD-10-CM | POA: Diagnosis not present

## 2017-12-11 NOTE — ED Provider Notes (Signed)
[name]    CSN: [id number] Arrival date & time: 12/11/17  1214     History   Chief Complaint Chief Complaint  Patient presents with  . Ankle Pain  . Knee Pain    HPI Theresa Scott is a 62 y.o. female.   Patient fell off a 3 feet high ladder on 12/03/17, injuring her left ankle and lower leg.  She was evaluated at [facility] ED where x-rays of her left ankle and left tib/fib were negative for fracture.  She complains of continued pain/swelling in her left foot, ankle, and lower leg.  No shortness of breath or chest pain   The history is provided by the patient.    Past Medical History:  Diagnosis Date  . Chronic kidney disease   . Complication of anesthesia    hard to wake up and swelling after kidney stone removal in 1980 per pt  . Degenerative disc disease   . Depression   . Diabetes mellitus   . Fatty liver disease, nonalcoholic   . Gallstones   . GERD (gastroesophageal reflux disease)   . Hypercholesterolemia   . Hypertension   . Kidney stones   . Obesity   . Osteoporosis   . Pancreatitis     Patient Active Problem List   Diagnosis Date Noted  . NAFLD (nonalcoholic fatty liver disease) 12/04/2016  . 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 Aortic calcification ([place]) 04/20/2016  . AVNRT (AV nodal re-entry tachycardia) ([place]) 08/18/2013  . Daytime somnolence 06/26/2013  . Sleep disorder, circadian 06/26/2013  . History of gastric bypass, 08/22/2010. 11/03/2011  . IMPAIRED FASTING GLUCOSE 12/15/2010  . ANXIETY 01/31/2010  . PSVT 12/01/2009  . STASIS DERMATITIS 04/06/2009  . ALLERGIC RHINITIS 08/19/2008  . PERIPHERAL EDEMA 08/12/2008  . DIABETIC PERIPHERAL NEUROPATHY 07/20/2008  . Hyperlipemia 06/18/2008  . OBESITY 06/18/2008  . Major depression, chronic 06/18/2008  . Peripheral vascular disease, unspecified ([place]) 06/18/2008  . GERD 06/18/2008  . OSTEOARTHRITIS 06/18/2008  . INSOMNIA 06/18/2008  . INCONTINENCE 06/18/2008   . PROTEINURIA 06/18/2008  . NEPHROLITHIASIS, HX OF 06/18/2008    Past Surgical History:  Procedure Laterality Date  . BACK SURGERY  1990,2003,2004,2010  . CARDIAC CATHETERIZATION  2011   [facility].   Marland Kitchen CARDIAC SURGERY  2002   ablation  . CHOLECYSTECTOMY  1976  . COLONOSCOPY  2008  . ESOPHAGOGASTRODUODENOSCOPY N/A 01/27/2013   Procedure: ESOPHAGOGASTRODUODENOSCOPY (EGD) upper endoscopy;  Surgeon: Shann Medal, MD;  Location: [facility];  Service: General;  Laterality: N/A;  . 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 HAND SURGERY  2000   right  . TONSILLECTOMY  1971    OB History    No data available       Home Medications    Prior to Admission medications   Medication Sig Start Date End Date Taking? Authorizing Provider  ACCU-CHEK FASTCLIX LANCETS MISC For testing blood sugars 1-2 times daily. DX: R73.01 07/05/17   Hali Marry, MD  Alcohol Swabs (B-D SINGLE USE SWABS REGULAR) PADS To be used when testing blood sugars 1-2 daily. DX: R73.01 07/11/17   Hali Marry, MD  amitriptyline (ELAVIL) 25 MG tablet TAKE 1 TO 2 TABLETS AT BEDTIME 11/08/16   Hali Marry, MD  baclofen (LIORESAL) 10 MG tablet  10/05/15   [provider]  bisacodyl (DULCOLAX) 5 MG EC tablet Take 5 mg by mouth once.    [provider]  Blood Glucose Calibration (ACCU-CHEK SMARTVIEW  CONTROL) LIQD To be used to calibrate glucose meter as needed. 07/05/17   Hali Marry, MD  Blood Glucose Monitoring Suppl (ACCU-CHEK NANO SMARTVIEW) w/Device KIT Check fasting blood sugars every morning and once after largest meal of the day a few x a week. DX:R73.01 07/10/17   Hali Marry, MD  Cyanocobalamin (VITAMIN B-12 PO) Take 1 tablet by mouth every Wednesday.    [provider]  desvenlafaxine (PRISTIQ) 100 MG 24 hr tablet Take 1 tablet (100 mg total) by mouth daily. 12/26/16   Hali Marry, MD  Docusate Sodium (COLACE PO)  06/21/16   [provider]  EPINEPHrine 0.3 mg/0.3 mL IJ SOAJ injection Inject 0.3 mLs (0.3 mg total) into the muscle once. 12/17/15   Hali Marry, MD  gabapentin (NEURONTIN) 300 MG capsule TAKE 2 CAPSULES (600 MG TOTAL) BY MOUTH DAILY. 11/06/17   Hali Marry, MD  glucose blood test strip Accu-chek smartview test strip For testing blood sugars 2 times daily. DX:R73.01 08/03/17   Hali Marry, MD  metFORMIN (GLUCOPHAGE-XR) 500 MG 24 hr tablet TAKE 1 TABLET (500 MG TOTAL) BY MOUTH DAILY WITH BREAKFAST. 09/18/17   Hali Marry, MD  metoprolol succinate (TOPROL-XL) 25 MG 24 hr tablet TAKE 1 TABLET EVERY DAY 11/22/17   Hali Marry, MD  Multiple Vitamin (MULTIVITAMIN) tablet Take 1 tablet by mouth 2 (two) times daily.     [provider]  nystatin (NYSTATIN) powder Apply as needed to affected areas. 07/05/17   Hali Marry, MD  omeprazole (PRILOSEC) 40 MG capsule Take 1 capsule (40 mg total) by mouth daily. 07/05/17   Hali Marry, MD  ondansetron (ZOFRAN-ODT) 4 MG disintegrating tablet Take 1 tablet (4 mg total) by mouth every 8 (eight) hours as needed for nausea or vomiting. 07/05/17   Hali Marry, MD  Oxycodone HCl 10 MG TABS  08/04/16   [provider]  polyethylene glycol powder (MIRALAX) powder Take 1 Container by mouth once.    [provider]  Teriparatide, Recombinant, (FORTEO) 600 MCG/2.4ML SOLN Inject 20 mcg into the skin. Prescribed by Dr Rennis Harding.    [provider]    Family History Family History  Problem Relation Age of Onset  . Lung cancer Mother   . Alcohol abuse Mother   . Colon cancer Brother        late 71's  . Liver cancer Brother   . Alcohol abuse Brother   . Cirrhosis Sister        secondary medication  . Diabetes Sister   . Kidney failure Brother        Dialysis at 37, born single kidney  . Esophageal cancer Neg Hx   . Stomach cancer Neg Hx   . Rectal cancer Neg Hx     Social  History Social History   Tobacco Use  . Smoking status: Former Smoker    Packs/day: 0.50    Years: 18.00    Pack years: 9.00    Last attempt to quit: 08/04/1990    Years since quitting: 27.3  . Smokeless tobacco: Never Used  Substance Use Topics  . Alcohol use: No  . Drug use: No     Allergies   Bee venom; Loratadine; Nsaids; and Latex   Review of Systems Review of Systems  Constitutional: Negative for activity change, chills, diaphoresis, fatigue and fever.  HENT: Negative.   Eyes: Negative.   Respiratory: Negative for cough, chest tightness, shortness  of breath, wheezing and stridor.   Cardiovascular: Negative for chest pain.  Gastrointestinal: Negative.   Genitourinary: Negative.   Musculoskeletal: Positive for joint swelling.       Left lower leg, ankle and foot pain/swelling.  Skin: Negative.   Neurological: Negative for dizziness, light-headedness and headaches.     Physical Exam Triage Vital Signs ED Triage Vitals [12/11/17 1246]  Enc Vitals Group     BP (!) 148/79     Pulse Rate 73     Resp 16     Temp 98.3 F (36.8 C)     Temp Source Oral     SpO2 97 %     Weight      Height      Head Circumference      Peak Flow      Pain Score      Pain Loc      Pain Edu?      Excl. in [place]?    No data found.  Updated Vital Signs BP (!) 148/79 (BP Location: Right Arm)   Pulse 73   Temp 98.3 F (36.8 C) (Oral)   Resp 16   Wt 192 lb (87.1 kg)   SpO2 97%   BMI 30.07 kg/m   Visual Acuity Right Eye Distance:   Left Eye Distance:   Bilateral Distance:    Right Eye Near:   Left Eye Near:    Bilateral Near:     Physical Exam  Constitutional: She appears well-developed and well-nourished. No distress.  HENT:  Head: Normocephalic.  Eyes: Pupils are equal, round, and reactive to light.  Neck: Normal range of motion.  Cardiovascular: Regular rhythm.  Pulmonary/Chest: Breath sounds normal.  Abdominal: There is no tenderness.  Musculoskeletal: She  exhibits edema.       Left ankle: She exhibits decreased range of motion and swelling. She exhibits no ecchymosis and normal pulse. Tenderness.       Legs:      Feet:  Left foot and ankle have diffuse mild swelling and tenderness in distribution as noted on diagram.  Foot and ankle appear mildly cyanotic, but pedal pulses are intact.  Tenderness extends to the left distal posterior calf.  Neurological: She is alert.  Skin: Skin is warm and dry.  Nursing note and vitals reviewed.    UC Treatments / Results  Labs (all labs ordered are listed, but only abnormal results are displayed) Labs Reviewed - No data to display  EKG  EKG Interpretation None       Radiology  CLINICAL DATA:  Pain following fall from ladder 1 week prior  EXAM: LEFT ANKLE COMPLETE - 3+ VIEW  COMPARISON:  December 04, 2017  FINDINGS: Frontal, oblique, and lateral views were obtained. There is generalized soft tissue swelling. There is no demonstrable fracture. There is a probable small joint effusion. There is no appreciable joint space narrowing or erosion. The ankle mortise appears intact. There is a posterior calcaneal spur  IMPRESSION: Soft tissue swelling. Suspect joint effusion. There may be underlying ligamentous injury.  No fracture is evident. The ankle mortise appears intact. There is a posterior calcaneal spur.   Electronically Signed   By: Lowella Grip III M.D.   On: 12/11/2017 13:48  EXAM: LEFT FOOT - COMPLETE 3+ VIEW  COMPARISON:  None.  FINDINGS: Frontal, oblique, and lateral views were obtained. There is no evident fracture or dislocation. There is mild narrowing of all PIP and DIP joints. No erosive change. There is  a spur arising from the posterior calcaneus.  IMPRESSION: No evident fracture or dislocation. Narrowing multiple distal joints. No erosive change. Posterior calcaneal spur present.   Electronically Signed   By: Lowella Grip III  M.D.   On: 12/11/2017 13:50  CLINICAL DATA:  Left lower extremity pain and edema after falling from ladder last week. Evaluate for DVT.  EXAM: LEFT LOWER EXTREMITY VENOUS DOPPLER ULTRASOUND  TECHNIQUE: Gray-scale sonography with graded compression, as well as color Doppler and duplex ultrasound were performed to evaluate the lower extremity deep venous systems from the level of the common femoral vein and including the common femoral, femoral, profunda femoral, popliteal and calf veins including the posterior tibial, peroneal and gastrocnemius veins when visible. The superficial great saphenous vein was also interrogated. Spectral Doppler was utilized to evaluate flow at rest and with distal augmentation maneuvers in the common femoral, femoral and popliteal veins.  COMPARISON:  None.  FINDINGS: Contralateral Common Femoral Vein: Respiratory phasicity is normal and symmetric with the symptomatic side. No evidence of thrombus. Normal compressibility.  Common Femoral Vein: No evidence of thrombus. Normal compressibility, respiratory phasicity and response to augmentation.  Saphenofemoral Junction: No evidence of thrombus. Normal compressibility and flow on color Doppler imaging.  Profunda Femoral Vein: No evidence of thrombus. Normal compressibility and flow on color Doppler imaging.  Femoral Vein: No evidence of thrombus. Normal compressibility, respiratory phasicity and response to augmentation.  Popliteal Vein: No evidence of thrombus. Normal compressibility, respiratory phasicity and response to augmentation.  Calf Veins: No evidence of thrombus. Normal compressibility and flow on color Doppler imaging.  Superficial Great Saphenous Vein: No evidence of thrombus. Normal compressibility.  Venous Reflux:  None.  Other Findings:  None.  IMPRESSION: No evidence of DVT within the left lower extremity.   Electronically Signed   By: Sandi Mariscal  M.D.   On: 12/11/2017 15:59   Procedures Procedures (including critical care time)  Medications Ordered in UC Medications - No data to display   Initial Impression / Assessment and Plan / UC Course  I have reviewed the triage vital signs and the nursing notes.  Pertinent labs & imaging results that were available during my care of the patient were reviewed by me and considered in my medical decision making (see chart for details).    No evidence fracture or DVT. Elevate leg whenever possible.  Wear cam walker boot daytime. Followup with Dr. Aundria Mems or Dr. Lynne Leader ([facility]).  Final Clinical Impressions(s) / UC Diagnoses   Final diagnoses:  Moderate left ankle sprain, subsequent encounter  Foot sprain, left, sequela  Pain and swelling of right lower leg    ED Discharge Orders    None          Kandra Nicolas, MD 12/15/17 2055

## 2017-12-11 NOTE — Discharge Instructions (Signed)
Elevate leg whenever possible.  Wear cam walker boot daytime.

## 2017-12-11 NOTE — ED Triage Notes (Signed)
Pt c/o LT ankle and leg pain up to her LT knee post fall on 12/03/17 from a 3 foot ladder.

## 2017-12-12 ENCOUNTER — Ambulatory Visit: Payer: Medicare HMO

## 2017-12-20 ENCOUNTER — Other Ambulatory Visit (INDEPENDENT_AMBULATORY_CARE_PROVIDER_SITE_OTHER): Payer: Medicare HMO

## 2017-12-20 ENCOUNTER — Ambulatory Visit (AMBULATORY_SURGERY_CENTER): Payer: Medicare HMO | Admitting: Internal Medicine

## 2017-12-20 ENCOUNTER — Other Ambulatory Visit: Payer: Self-pay

## 2017-12-20 ENCOUNTER — Encounter: Payer: Self-pay | Admitting: Internal Medicine

## 2017-12-20 VITALS — BP 149/73 | HR 60 | Temp 98.7°F | Resp 14 | Ht 64.0 in | Wt 195.0 lb

## 2017-12-20 DIAGNOSIS — K7400 Hepatic fibrosis, unspecified: Secondary | ICD-10-CM

## 2017-12-20 DIAGNOSIS — Z1211 Encounter for screening for malignant neoplasm of colon: Secondary | ICD-10-CM | POA: Diagnosis present

## 2017-12-20 DIAGNOSIS — K635 Polyp of colon: Secondary | ICD-10-CM

## 2017-12-20 DIAGNOSIS — K760 Fatty (change of) liver, not elsewhere classified: Secondary | ICD-10-CM | POA: Diagnosis not present

## 2017-12-20 DIAGNOSIS — K5140 Inflammatory polyps of colon without complications: Secondary | ICD-10-CM | POA: Diagnosis not present

## 2017-12-20 DIAGNOSIS — D122 Benign neoplasm of ascending colon: Secondary | ICD-10-CM

## 2017-12-20 DIAGNOSIS — D125 Benign neoplasm of sigmoid colon: Secondary | ICD-10-CM | POA: Diagnosis not present

## 2017-12-20 LAB — CBC WITH DIFFERENTIAL/PLATELET
Basophils Absolute: 0.1 10*3/uL (ref 0.0–0.1)
Basophils Relative: 0.9 % (ref 0.0–3.0)
Eosinophils Absolute: 0.1 10*3/uL (ref 0.0–0.7)
Eosinophils Relative: 2 % (ref 0.0–5.0)
HCT: 39.4 % (ref 36.0–46.0)
Hemoglobin: 13.5 g/dL (ref 12.0–15.0)
Lymphocytes Relative: 30.4 % (ref 12.0–46.0)
Lymphs Abs: 1.7 10*3/uL (ref 0.7–4.0)
MCHC: 34.2 g/dL (ref 30.0–36.0)
MCV: 90.8 fl (ref 78.0–100.0)
Monocytes Absolute: 0.6 10*3/uL (ref 0.1–1.0)
Monocytes Relative: 11 % (ref 3.0–12.0)
Neutro Abs: 3.1 10*3/uL (ref 1.4–7.7)
Neutrophils Relative %: 55.7 % (ref 43.0–77.0)
Platelets: 179 10*3/uL (ref 150.0–400.0)
RBC: 4.34 Mil/uL (ref 3.87–5.11)
RDW: 12.7 % (ref 11.5–15.5)
WBC: 5.6 10*3/uL (ref 4.0–10.5)

## 2017-12-20 MED ORDER — SODIUM CHLORIDE 0.9 % IV SOLN
500.0000 mL | Freq: Once | INTRAVENOUS | Status: DC
Start: 2017-12-20 — End: 2018-08-05

## 2017-12-20 NOTE — Progress Notes (Signed)
To PACU, VSS. Report to [email or web address] 

## 2017-12-20 NOTE — Op Note (Signed)
[place] Patient Name: Theresa Scott Procedure Date: 12/20/2017 8:40 AM MRN: [id number] Endoscopist: Gatha Mayer , MD Age: 62 Referring MD:  Date of Birth: 1955/11/13 Gender: Female Account #: [id number] Procedure:                Colonoscopy Indications:              Screening for colorectal malignant neoplasm Medicines:                Propofol per Anesthesia, Monitored Anesthesia Care Procedure:                Pre-Anesthesia Assessment:                           - Prior to the procedure, a History and Physical                            was performed, and patient medications and                            allergies were reviewed. The patient's tolerance of                            previous anesthesia was also reviewed. The risks                            and benefits of the procedure and the sedation                            options and risks were discussed with the patient.                            All questions were answered, and informed consent                            was obtained. Prior Anticoagulants: The patient has                            taken no previous anticoagulant or antiplatelet                            agents. ASA Grade Assessment: III - A patient with                            severe systemic disease. After reviewing the risks                            and benefits, the patient was deemed in                            satisfactory condition to undergo the procedure.                           After obtaining informed consent, the colonoscope  was passed under direct vision. Throughout the                            procedure, the patient's blood pressure, pulse, and                            oxygen saturations were monitored continuously. The                            Model CF-HQ190L [id number] [id number]) scope was introduced                            through the anus and advanced to the the cecum,                             identified by appendiceal orifice and ileocecal                            valve. The colonoscopy was performed without                            difficulty. The patient tolerated the procedure                            well. The quality of the bowel preparation was                            adequate. The ileocecal valve, appendiceal orifice,                            and rectum were photographed. The bowel preparation                            used was Miralax. Scope In: 8:53:59 AM Scope Out: 9:13:42 AM Scope Withdrawal Time: 0 hours 12 minutes 39 seconds  Total Procedure Duration: 0 hours 19 minutes 43 seconds  Findings:                 The perianal and digital rectal examinations were                            normal.                           Two sessile polyps were found in the sigmoid colon                            and ascending colon. The polyps were diminutive in                            size. These polyps were removed with a cold snare.                            Resection and retrieval were complete. Verification  of patient identification for the specimen was                            done. Estimated blood loss was minimal.                           A few diverticula were found in the right colon.                           The exam was otherwise without abnormality on                            direct and retroflexion views. Complications:            No immediate complications. Estimated Blood Loss:     Estimated blood loss was minimal. Impression:               - Two diminutive polyps in the sigmoid colon and in                            the ascending colon, removed with a cold snare.                            Resected and retrieved.                           - Diverticulosis in the right colon.                           - The examination was otherwise normal on direct                            and retroflexion  views. Recommendation:           - Patient has a contact number available for                            emergencies. The signs and symptoms of potential                            delayed complications were discussed with the                            patient. Return to normal activities tomorrow.                            Written discharge instructions were provided to the                            patient.                           - Resume previous diet.                           - Continue present medications.                           -  Repeat colonoscopy is recommended. The                            colonoscopy date will be determined after pathology                            results from today's exam become available for                            review.                           - Will do CBC today to get PLT count as part of                            liver fibrosis/NASH f/u Gatha Mayer, MD 12/20/2017 9:19:38 AM This report has been signed electronically.

## 2017-12-20 NOTE — Progress Notes (Signed)
Called to room to assist during endoscopic procedure.  Patient ID and intended procedure confirmed with present staff. Received instructions for my participation in the procedure from the performing physician.  

## 2017-12-20 NOTE — Patient Instructions (Addendum)
I found and removed 2 tiny polyps - no signs of cancer. We will get them checked by pathologist and let you know results.  A few pockets called diverticulosis. Not usually a problem.  I am having you get a complete blood count today as part of follow-up on your liver.  I appreciate the opportunity to care for you. Gatha Mayer, MD, [facility]   HANDOUTS GIVEN FOR POLYPS AND DIVERTICULOSIS  YOU HAD AN ENDOSCOPIC PROCEDURE TODAY AT [facility]:   Refer to the procedure report that was given to you for any specific questions about what was found during the examination.  If the procedure report does not answer your questions, please call your gastroenterologist to clarify.  If you requested that your care partner not be given the details of your procedure findings, then the procedure report has been included in a sealed envelope for you to review at your convenience later.  YOU SHOULD EXPECT: Some feelings of bloating in the abdomen. Passage of more gas than usual.  Walking can help get rid of the air that was put into your GI tract during the procedure and reduce the bloating. If you had a lower endoscopy (such as a colonoscopy or flexible sigmoidoscopy) you may notice spotting of blood in your stool or on the toilet paper. If you underwent a bowel prep for your procedure, you may not have a normal bowel movement for a few days.  Please Note:  You might notice some irritation and congestion in your nose or some drainage.  This is from the oxygen used during your procedure.  There is no need for concern and it should clear up in a day or so.  SYMPTOMS TO REPORT IMMEDIATELY:   Following lower endoscopy (colonoscopy or flexible sigmoidoscopy):  Excessive amounts of blood in the stool  Significant tenderness or worsening of abdominal pains  Swelling of the abdomen that is new, acute  Fever of 100F or higher   For urgent or emergent issues, a gastroenterologist can be  reached at any hour by calling [phone].   DIET:  We do recommend a small meal at first, but then you may proceed to your regular diet.  Drink plenty of fluids but you should avoid alcoholic beverages for 24 hours.  ACTIVITY:  You should plan to take it easy for the rest of today and you should NOT DRIVE or use heavy machinery until tomorrow (because of the sedation medicines used during the test).    FOLLOW UP: Our staff will call the number listed on your records the next business day following your procedure to check on you and address any questions or concerns that you may have regarding the information given to you following your procedure. If we do not reach you, we will leave a message.  However, if you are feeling well and you are not experiencing any problems, there is no need to return our call.  We will assume that you have returned to your regular daily activities without incident.  If any biopsies were taken you will be contacted by phone or by letter within the next 1-3 weeks.  Please call us at [phone] if you have not heard about the biopsies in 3 weeks.    SIGNATURES/CONFIDENTIALITY: You and/or your care partner have signed paperwork which will be entered into your electronic medical record.  These signatures attest to the fact that that the information above on your After Visit Summary has been  reviewed and is understood.  Full responsibility of the confidentiality of this discharge information lies with you and/or your care-partner. 

## 2017-12-20 NOTE — Progress Notes (Signed)
Pt's states no medical or surgical changes since previsit or office visit. 

## 2017-12-21 ENCOUNTER — Telehealth: Payer: Self-pay | Admitting: *Deleted

## 2017-12-21 NOTE — Progress Notes (Signed)
CBC is normal I did fibrosis calculation for NAFLD and she remains F3-4 so stable  Please let her know thigs stable  rtc me in 1 year

## 2017-12-21 NOTE — Telephone Encounter (Signed)
  Follow up Call-  Call back number 12/20/2017 07/04/2016  Post procedure Call Back phone  # [phone]  Permission to leave phone message Yes Yes  Some recent data might be hidden     Patient questions:  Do you have a fever, pain , or abdominal swelling? No. Pain Score  0 *  Have you tolerated food without any problems? Yes.    Have you been able to return to your normal activities? Yes.    Do you have any questions about your discharge instructions: Diet   No. Medications  No. Follow up visit  No.  Do you have questions or concerns about your Care? No.  Actions: * If pain score is 4 or above: No action needed, pain <4.

## 2017-12-26 ENCOUNTER — Encounter: Payer: Self-pay | Admitting: Internal Medicine

## 2017-12-26 NOTE — Progress Notes (Signed)
Polyps not pre-cancerous recall 2029

## 2018-01-01 DIAGNOSIS — M5116 Intervertebral disc disorders with radiculopathy, lumbar region: Secondary | ICD-10-CM | POA: Diagnosis not present

## 2018-01-01 DIAGNOSIS — M545 Low back pain: Secondary | ICD-10-CM | POA: Diagnosis not present

## 2018-01-01 DIAGNOSIS — G894 Chronic pain syndrome: Secondary | ICD-10-CM | POA: Diagnosis not present

## 2018-01-08 ENCOUNTER — Other Ambulatory Visit: Payer: Self-pay

## 2018-01-08 ENCOUNTER — Encounter (HOSPITAL_BASED_OUTPATIENT_CLINIC_OR_DEPARTMENT_OTHER): Payer: Self-pay

## 2018-01-08 ENCOUNTER — Emergency Department (HOSPITAL_BASED_OUTPATIENT_CLINIC_OR_DEPARTMENT_OTHER)
Admission: EM | Admit: 2018-01-08 | Discharge: 2018-01-08 | Disposition: A | Discharge status: Home / Self Care | Payer: Medicare HMO | Attending: Emergency Medicine | Admitting: Emergency Medicine

## 2018-01-08 ENCOUNTER — Emergency Department (HOSPITAL_BASED_OUTPATIENT_CLINIC_OR_DEPARTMENT_OTHER): Payer: Medicare HMO

## 2018-01-08 DIAGNOSIS — R0981 Nasal congestion: Secondary | ICD-10-CM | POA: Diagnosis not present

## 2018-01-08 DIAGNOSIS — F329 Major depressive disorder, single episode, unspecified: Secondary | ICD-10-CM | POA: Insufficient documentation

## 2018-01-08 DIAGNOSIS — R509 Fever, unspecified: Secondary | ICD-10-CM | POA: Diagnosis not present

## 2018-01-08 DIAGNOSIS — Z9884 Bariatric surgery status: Secondary | ICD-10-CM | POA: Insufficient documentation

## 2018-01-08 DIAGNOSIS — Z7984 Long term (current) use of oral hypoglycemic drugs: Secondary | ICD-10-CM | POA: Insufficient documentation

## 2018-01-08 DIAGNOSIS — Z87891 Personal history of nicotine dependence: Secondary | ICD-10-CM | POA: Diagnosis not present

## 2018-01-08 DIAGNOSIS — Z9049 Acquired absence of other specified parts of digestive tract: Secondary | ICD-10-CM | POA: Insufficient documentation

## 2018-01-08 DIAGNOSIS — J029 Acute pharyngitis, unspecified: Secondary | ICD-10-CM | POA: Diagnosis not present

## 2018-01-08 DIAGNOSIS — E1122 Type 2 diabetes mellitus with diabetic chronic kidney disease: Secondary | ICD-10-CM | POA: Insufficient documentation

## 2018-01-08 DIAGNOSIS — Z91040 Latex allergy status: Secondary | ICD-10-CM | POA: Insufficient documentation

## 2018-01-08 DIAGNOSIS — R05 Cough: Secondary | ICD-10-CM | POA: Diagnosis not present

## 2018-01-08 DIAGNOSIS — J111 Influenza due to unidentified influenza virus with other respiratory manifestations: Secondary | ICD-10-CM

## 2018-01-08 DIAGNOSIS — Z79899 Other long term (current) drug therapy: Secondary | ICD-10-CM | POA: Diagnosis not present

## 2018-01-08 DIAGNOSIS — F419 Anxiety disorder, unspecified: Secondary | ICD-10-CM | POA: Diagnosis not present

## 2018-01-08 DIAGNOSIS — R69 Illness, unspecified: Secondary | ICD-10-CM

## 2018-01-08 DIAGNOSIS — N189 Chronic kidney disease, unspecified: Secondary | ICD-10-CM | POA: Diagnosis not present

## 2018-01-08 DIAGNOSIS — I129 Hypertensive chronic kidney disease with stage 1 through stage 4 chronic kidney disease, or unspecified chronic kidney disease: Secondary | ICD-10-CM | POA: Diagnosis not present

## 2018-01-08 MED ORDER — IBUPROFEN 600 MG PO TABS: 600.0000 mg | ORAL_TABLET | Freq: Four times a day (QID) | ORAL | 0 refills | Status: DC | PRN

## 2018-01-08 MED ORDER — METOCLOPRAMIDE HCL 10 MG PO TABS: 10.0000 mg | ORAL_TABLET | Freq: Four times a day (QID) | ORAL | 0 refills | Status: DC

## 2018-01-08 MED ORDER — CETIRIZINE HCL 10 MG PO TABS: 10.0000 mg | ORAL_TABLET | Freq: Every day | ORAL | 0 refills | Status: DC

## 2018-01-08 MED ORDER — ACETAMINOPHEN 500 MG PO TABS: 500.0000 mg | ORAL_TABLET | Freq: Four times a day (QID) | ORAL | 0 refills | Status: DC | PRN

## 2018-01-08 MED ORDER — ACETAMINOPHEN 500 MG PO TABS
1000.0000 mg | ORAL_TABLET | Freq: Once | ORAL | Status: AC
  Administered 2018-01-08: 1000 mg via ORAL
  Filled 2018-01-08: qty 2

## 2018-01-08 MED ORDER — BENZONATATE 100 MG PO CAPS: 100.0000 mg | ORAL_CAPSULE | Freq: Three times a day (TID) | ORAL | 0 refills | Status: DC

## 2018-01-08 NOTE — ED Provider Notes (Signed)
[facility] EMERGENCY DEPARTMENT Provider Note   CSN: [id number] Arrival date & time: 01/08/18  1821     History   Chief Complaint Chief Complaint  Patient presents with  . Cough    HPI Theresa Scott is a 61 y.o. female with history of hypertension, diabetes who presents with a 2-day history of fever, chills, cough, nasal congestion, R ear pain, sore throat, body aches, headache.  Patient has been taking ibuprofen and over-the-counter cough medicine without relief.  Her son is sick with similar symptoms.  She reports some mild shortness of breath on exertion.  She denies chest pain, abdominal pain, vomiting.  She has had associated nausea and nonbloody diarrhea.  HPI  Past Medical History:  Diagnosis Date  . Chronic kidney disease   . Complication of anesthesia    hard to wake up and swelling after kidney stone removal in 1980 per pt  . Degenerative disc disease   . Depression   . Diabetes mellitus   . Fatty liver disease, nonalcoholic   . Gallstones   . GERD (gastroesophageal reflux disease)   . Hypercholesterolemia   . Hypertension   . Kidney stones   . Obesity   . Osteoporosis   . Pancreatitis     Patient Active Problem List   Diagnosis Date Noted  . NAFLD (nonalcoholic fatty liver disease) 12/04/2016  . 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 Aortic calcification ([place]) 04/20/2016  . 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 Daytime somnolence 06/26/2013  . Sleep disorder, circadian 06/26/2013  . History of gastric bypass, 08/22/2010. 11/03/2011  . IMPAIRED FASTING GLUCOSE 12/15/2010  . ANXIETY 01/31/2010  . PSVT 12/01/2009  . STASIS DERMATITIS 04/06/2009  . ALLERGIC RHINITIS 08/19/2008  . PERIPHERAL EDEMA 08/12/2008  . DIABETIC PERIPHERAL NEUROPATHY 07/20/2008  . Hyperlipemia 06/18/2008  . OBESITY 06/18/2008  . Major depression, chronic 06/18/2008  . Peripheral vascular disease, unspecified ([place]) 06/18/2008  . GERD  06/18/2008  . OSTEOARTHRITIS 06/18/2008  . INSOMNIA 06/18/2008  . INCONTINENCE 06/18/2008  . PROTEINURIA 06/18/2008  . NEPHROLITHIASIS, HX OF 06/18/2008    Past Surgical History:  Procedure Laterality Date  . BACK SURGERY  1990,2003,2004,2010  . CARDIAC CATHETERIZATION  2011    Cards.   Marland Kitchen CARDIAC SURGERY  2002   ablation  . CHOLECYSTECTOMY  1976  . COLONOSCOPY  2008  . ESOPHAGOGASTRODUODENOSCOPY N/A 01/27/2013   Procedure: ESOPHAGOGASTRODUODENOSCOPY (EGD) upper endoscopy;  Surgeon: Shann Medal, MD;  Location: [facility];  Service: General;  Laterality: N/A;  . 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 HAND SURGERY  2000   right  . TONSILLECTOMY  1971    OB History    No data available       Home Medications    Prior to Admission medications   Medication Sig Start Date End Date Taking? Authorizing Provider  ACCU-CHEK FASTCLIX LANCETS MISC For testing blood sugars 1-2 times daily. DX: R73.01 07/05/17   Hali Marry, MD  acetaminophen (TYLENOL) 500 MG tablet Take 1 tablet (500 mg total) by mouth every 6 (six) hours as needed. 01/08/18   Jamicia Haaland, Bea Graff, PA-C  Alcohol Swabs (B-D SINGLE USE SWABS REGULAR) PADS To be used when testing blood sugars 1-2 daily. DX: R73.01 07/11/17   Hali Marry, MD  amitriptyline (ELAVIL) 25 MG tablet TAKE 1 TO 2 TABLETS AT BEDTIME Patient not taking: Reported on 12/20/2017 11/08/16   Hali Marry, MD  baclofen (LIORESAL) 10 MG tablet  10/05/15   [provider]  benzonatate (TESSALON) 100 MG capsule Take 1 capsule (100 mg total) by mouth every 8 (eight) hours. 01/08/18   Delores Edelstein, Bea Graff, PA-C  Blood Glucose Calibration (ACCU-CHEK SMARTVIEW CONTROL) LIQD To be used to calibrate glucose meter as needed. 07/05/17   Hali Marry, MD  Blood Glucose Monitoring Suppl (ACCU-CHEK NANO SMARTVIEW) w/Device KIT Check fasting blood sugars every morning and once after largest meal of the day a few x a week. DX:R73.01 07/10/17    Hali Marry, MD  cetirizine (ZYRTEC ALLERGY) 10 MG tablet Take 1 tablet (10 mg total) by mouth daily. 01/08/18   Antionio Negron, Bea Graff, PA-C  Cyanocobalamin (VITAMIN B-12 PO) Take 1 tablet by mouth every Wednesday.    [provider]  desvenlafaxine (PRISTIQ) 100 MG 24 hr tablet Take 1 tablet (100 mg total) by mouth daily. 12/26/16   Hali Marry, MD  Docusate Sodium (COLACE PO)  06/21/16   [provider]  EPINEPHrine 0.3 mg/0.3 mL IJ SOAJ injection Inject 0.3 mLs (0.3 mg total) into the muscle once. Patient not taking: Reported on 12/20/2017 12/17/15   Hali Marry, MD  gabapentin (NEURONTIN) 300 MG capsule TAKE 2 CAPSULES (600 MG TOTAL) BY MOUTH DAILY. 11/06/17   Hali Marry, MD  glucose blood test strip Accu-chek smartview test strip For testing blood sugars 2 times daily. DX:R73.01 08/03/17   Hali Marry, MD  ibuprofen (ADVIL,MOTRIN) 600 MG tablet Take 1 tablet (600 mg total) by mouth every 6 (six) hours as needed. 01/08/18   Yanisa Goodgame, Bea Graff, PA-C  metFORMIN (GLUCOPHAGE-XR) 500 MG 24 hr tablet TAKE 1 TABLET (500 MG TOTAL) BY MOUTH DAILY WITH BREAKFAST. 09/18/17   Hali Marry, MD  metoCLOPramide (REGLAN) 10 MG tablet Take 1 tablet (10 mg total) by mouth every 6 (six) hours. 01/08/18   Maleia Weems, Bea Graff, PA-C  metoprolol succinate (TOPROL-XL) 25 MG 24 hr tablet TAKE 1 TABLET EVERY DAY 11/22/17   Hali Marry, MD  Multiple Vitamin (MULTIVITAMIN) tablet Take 1 tablet by mouth 2 (two) times daily.     [provider]  nystatin (NYSTATIN) powder Apply as needed to affected areas. Patient not taking: Reported on 12/20/2017 07/05/17   Hali Marry, MD  omeprazole (PRILOSEC) 40 MG capsule Take 1 capsule (40 mg total) by mouth daily. 07/05/17   Hali Marry, MD  ondansetron (ZOFRAN-ODT) 4 MG disintegrating tablet Take 1 tablet (4 mg total) by mouth every 8 (eight) hours as needed for nausea or vomiting. 07/05/17    Hali Marry, MD  Oxycodone HCl 10 MG TABS  08/04/16   [provider]  Teriparatide, Recombinant, (FORTEO) 600 MCG/2.4ML SOLN Inject 20 mcg into the skin. Prescribed by Dr Rennis Harding.    [provider]    Family History Family History  Problem Relation Age of Onset  . Lung cancer Mother   . Alcohol abuse Mother   . Colon cancer Brother        late 74's  . Liver cancer Brother   . Alcohol abuse Brother   . Cirrhosis Sister        secondary medication  . Diabetes Sister   . Kidney failure Brother        Dialysis at 56, born single kidney  . Esophageal cancer Neg Hx   . Stomach cancer Neg Hx   . Rectal cancer Neg Hx     Social History Social History   Tobacco  Use  . Smoking status: Former Smoker    Packs/day: 0.50    Years: 18.00    Pack years: 9.00    Last attempt to quit: 08/04/1990    Years since quitting: 27.4  . Smokeless tobacco: Never Used  Substance Use Topics  . Alcohol use: No  . Drug use: No     Allergies   Bee venom; Loratadine; Nsaids; and Latex   Review of Systems Review of Systems  Constitutional: Positive for chills and fever.  HENT: Positive for congestion, ear pain and sore throat.   Respiratory: Positive for cough and shortness of breath (on exertion).   Cardiovascular: Negative for chest pain.  Gastrointestinal: Positive for diarrhea and nausea. Negative for abdominal pain and vomiting.  Musculoskeletal: Positive for myalgias. Negative for back pain.  Skin: Negative for rash and wound.  Neurological: Positive for headaches.  Psychiatric/Behavioral: The patient is not nervous/anxious.      Physical Exam Updated Vital Signs BP (!) 150/83 (BP Location: Left Arm)   Pulse 99   Temp (!) 101.7 F (38.7 C) (Oral)   Resp (!) 22   Ht _0  (1.651 m)   Wt 86.2 kg (190 lb)   SpO2 99%   BMI 31.62 kg/m   Physical Exam  Constitutional: She appears well-developed and well-nourished. No distress.  HENT:  Head:  Normocephalic and atraumatic.  Right Ear: Tympanic membrane normal.  Left Ear: Tympanic membrane normal.  Mouth/Throat: Posterior oropharyngeal erythema present. No oropharyngeal exudate or posterior oropharyngeal edema.  Large amount of cerumen removed in one piece from right ear to visualize TM  Eyes: Conjunctivae are normal. Pupils are equal, round, and reactive to light. Right eye exhibits no discharge. Left eye exhibits no discharge. No scleral icterus.  Neck: Normal range of motion. Neck supple. No thyromegaly present.  Cardiovascular: Normal rate, regular rhythm, normal heart sounds and intact distal pulses. Exam reveals no gallop and no friction rub.  No murmur heard. Pulmonary/Chest: Effort normal and breath sounds normal. No stridor. No respiratory distress. She has no wheezes. She has no rales.  Abdominal: Soft. Bowel sounds are normal. She exhibits no distension. There is no tenderness. There is no rebound and no guarding.  Musculoskeletal: She exhibits no edema.  Lymphadenopathy:    She has no cervical adenopathy.  Neurological: She is alert. Coordination normal.  Skin: Skin is warm and dry. No rash noted. She is not diaphoretic. No pallor.  Psychiatric: She has a normal mood and affect.  Nursing note and vitals reviewed.    ED Treatments / Results  Labs (all labs ordered are listed, but only abnormal results are displayed) Labs Reviewed - No data to display  EKG  EKG Interpretation None       Radiology Dg Chest 2 View  Result Date: 01/08/2018 CLINICAL DATA:  Productive cough and fever. EXAM: CHEST - 2 VIEW COMPARISON:  12/04/2017 and 11/27/2016 FINDINGS: The cardiomediastinal silhouette is within normal limits. No airspace consolidation, edema, pleural effusion, or pneumothorax is identified. Partially visualized lumbar spinal fusion. Prior L1 vertebral augmentation. Spinal stimulator terminates in the midthoracic spine. IMPRESSION: No active cardiopulmonary disease.  Electronically Signed   By: Logan Bores M.D.   On: 01/08/2018 19:25    Procedures Procedures (including critical care time)  Medications Ordered in ED Medications  acetaminophen (TYLENOL) tablet 1,000 mg (1,000 mg Oral Given 01/08/18 2049)     Initial Impression / Assessment and Plan / ED Course  I have reviewed the triage vital signs  and the nursing notes.  Pertinent labs & imaging results that were available during my care of the patient were reviewed by me and considered in my medical decision making (see chart for details).     Patient  with influenza-like symptoms for the past 2 days.  Chest x-ray is negative.  Patient ambulating at 99-100% on room air.  Will discharge home with supportive treatment.  Patient is tolerating oral fluids.  We discussed the use of Tamiflu, however patient declines after discussion of side effects versus benefit.  Advised patient follow-up with your doctor in 3-4 days.  Return precautions discussed.  Patient understands and agrees with plan.  Patient vitals stable and discharged in satisfactory condition  Final Clinical Impressions(s) / ED Diagnoses   Final diagnoses:  Influenza-like illness    ED Discharge Orders        Ordered    metoCLOPramide (REGLAN) 10 MG tablet  Every 6 hours     01/08/18 2040    ibuprofen (ADVIL,MOTRIN) 600 MG tablet  Every 6 hours PRN     01/08/18 2040    acetaminophen (TYLENOL) 500 MG tablet  Every 6 hours PRN     01/08/18 2040    benzonatate (TESSALON) 100 MG capsule  Every 8 hours     01/08/18 2040    cetirizine (ZYRTEC ALLERGY) 10 MG tablet  Daily     01/08/18 2040       Frederica Kuster, PA-C 01/08/18 2143    Tegeler, Gwenyth Allegra, MD 01/09/18 [phone]

## 2018-01-08 NOTE — ED Notes (Signed)
ED Provider at bedside. 

## 2018-01-08 NOTE — ED Triage Notes (Signed)
C/o flu like sx day 2-NAD-presents to triage in w/c

## 2018-01-08 NOTE — Discharge Instructions (Signed)
Medications: Reglan, Tessalon, Zyrtec, ibuprofen, Tylenol  Treatment: Take Reglan every 6 hours as needed for nausea or vomiting.  Take Tessalon every 8 hours as needed for cough.  Take Zyrtec 1-2 times daily for nasal congestion.  Take ibuprofen every 6 hours.  You can alternate with Tylenol every 3 hours or choose one type every 6 hours.  Make sure to drink plenty of fluids and get plenty of rest.  Follow-up: Please follow-up with your doctor in 3-4 days for recheck.  Please return to the emergency department if you develop any new or worsening symptoms including severe shortness of breath, chest pain, intractable vomiting, or any other new or concerning symptoms.

## 2018-01-11 ENCOUNTER — Encounter: Payer: Self-pay | Admitting: Physician Assistant

## 2018-01-11 ENCOUNTER — Ambulatory Visit (INDEPENDENT_AMBULATORY_CARE_PROVIDER_SITE_OTHER): Payer: Medicare HMO | Admitting: Physician Assistant

## 2018-01-11 ENCOUNTER — Telehealth: Payer: Self-pay | Admitting: *Deleted

## 2018-01-11 VITALS — BP 148/82 | HR 62 | Temp 98.9°F | Resp 18 | Wt 189.0 lb

## 2018-01-11 DIAGNOSIS — R69 Illness, unspecified: Secondary | ICD-10-CM

## 2018-01-11 DIAGNOSIS — J111 Influenza due to unidentified influenza virus with other respiratory manifestations: Secondary | ICD-10-CM

## 2018-01-11 MED ORDER — GUAIFENESIN ER 600 MG PO TB12: 1200.0000 mg | ORAL_TABLET | Freq: Two times a day (BID) | ORAL | Status: DC

## 2018-01-11 NOTE — Patient Instructions (Signed)
-   continue your cough suppressant in the evening  - start plain Mucinex twice a day to make cough more productive. Take with at least 8 ounces of water - drink plenty of unsweetened, decaf fluids - alternate Tylenol 1000 mg every 8 hours as needed for bodyache and Ibuprofen 600 mg every 6 hours as needed  Influenza, Adult Influenza ("the flu") is an infection in the lungs, nose, and throat (respiratory tract). It is caused by a virus. The flu causes many common cold symptoms, as well as a high fever and body aches. It can make you feel very sick. The flu spreads easily from person to person (is contagious). Getting a flu shot (influenza vaccination) every year is the best way to prevent the flu. Follow these instructions at home:  Take over-the-counter and prescription medicines only as told by your doctor.  Use a cool mist humidifier to add moisture (humidity) to the air in your home. This can make it easier to breathe.  Rest as needed.  Drink enough fluid to keep your pee (urine) clear or pale yellow.  Cover your mouth and nose when you cough or sneeze.  Wash your hands with soap and water often, especially after you cough or sneeze. If you cannot use soap and water, use hand sanitizer.  Stay home from work or school as told by your doctor. Unless you are visiting your doctor, try to avoid leaving home until your fever has been gone for 24 hours without the use of medicine.  Keep all follow-up visits as told by your doctor. This is important. How is this prevented?  Getting a yearly (annual) flu shot is the best way to avoid getting the flu. You may get the flu shot in late summer, fall, or winter. Ask your doctor when you should get your flu shot.  Wash your hands often or use hand sanitizer often.  Avoid contact with people who are sick during cold and flu season.  Eat healthy foods.  Drink plenty of fluids.  Get enough sleep.  Exercise regularly. Contact a doctor  if:  You get new symptoms.  You have: ? Chest pain. ? Watery poop (diarrhea). ? A fever.  Your cough gets worse.  You start to have more mucus.  You feel sick to your stomach (nauseous).  You throw up (vomit). Get help right away if:  You start to be short of breath or have trouble breathing.  Your skin or nails turn a bluish color.  You have very bad pain or stiffness in your neck.  You get a sudden headache.  You get sudden pain in your face or ear.  You cannot stop throwing up. This information is not intended to replace advice given to you by your health care provider. Make sure you discuss any questions you have with your health care provider. Document Released: 07/18/2008 Document Revised: 03/16/2016 Document Reviewed: 08/03/2015 Elsevier Interactive Patient Education  2017 [employer].

## 2018-01-11 NOTE — Telephone Encounter (Signed)
lvm informing pt that her enrollment has expired. Told her that I requested that a fax be sent to [place] however, if there is a way for her to get this done faster to do so. Advised to rtn call .Elouise Munroe, [place]

## 2018-01-11 NOTE — Progress Notes (Signed)
HPI:                                                                Theresa Scott is a 62 y.o. female who presents to [facility] today for hospital follow-up / influenza-like illness  Theresa Scott is a 62 yo F with PMH of DM, HTN, CKD who presents today with flu-like symptoms beginning approximately 5 days ago. She was seen in the ED on 01/08/18 and had a negative CXR. Diagnosed clinically with influenza, but she declined Tamiflu. Symptoms are unchanged. Endorses sore throat, rhinorrhea, fatigue/malaise, and cough. Cough is starting to become more productive of purulent sputum. Denies dypsnea, chest pain, hemoptysis or fever. Blood sugars are running in the 90's.  Past Medical History:  Diagnosis Date  . Chronic kidney disease   . Complication of anesthesia    hard to wake up and swelling after kidney stone removal in 1980 per pt  . Degenerative disc disease   . Depression   . Diabetes mellitus   . Fatty liver disease, nonalcoholic   . Gallstones   . GERD (gastroesophageal reflux disease)   . Hypercholesterolemia   . Hypertension   . Kidney stones   . Obesity   . Osteoporosis   . Pancreatitis    Past Surgical History:  Procedure Laterality Date  . BACK SURGERY  1990,2003,2004,2010  . CARDIAC CATHETERIZATION  2011   [facility].   Marland Kitchen CARDIAC SURGERY  2002   ablation  . CHOLECYSTECTOMY  1976  . COLONOSCOPY  2008  . ESOPHAGOGASTRODUODENOSCOPY N/A 01/27/2013   Procedure: ESOPHAGOGASTRODUODENOSCOPY (EGD) upper endoscopy;  Surgeon: Shann Medal, MD;  Location: [facility];  Service: General;  Laterality: N/A;  . 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 HAND SURGERY  2000   right  . TONSILLECTOMY  1971   Social History   Tobacco Use  . Smoking status: Former Smoker    Packs/day: 0.50    Years: 18.00    Pack years: 9.00    Last attempt to quit: 08/04/1990    Years since quitting: 27.4  . Smokeless tobacco: Never Used  Substance Use Topics   . Alcohol use: No   family history includes Alcohol abuse in her brother and mother; Cirrhosis in her sister; Colon cancer in her brother; Diabetes in her sister; Kidney failure in her brother; Liver cancer in her brother; Lung cancer in her mother.    ROS: negative except as noted in the HPI  Medications: Current Outpatient Medications  Medication Sig Dispense Refill  . ACCU-CHEK FASTCLIX LANCETS MISC For testing blood sugars 1-2 times daily. DX: R73.01 306 each 4  . acetaminophen (TYLENOL) 500 MG tablet Take 1 tablet (500 mg total) by mouth every 6 (six) hours as needed. 30 tablet 0  . Alcohol Swabs (B-D SINGLE USE SWABS REGULAR) PADS To be used when testing blood sugars 1-2 daily. DX: R73.01 300 each 4  . amitriptyline (ELAVIL) 25 MG tablet TAKE 1 TO 2 TABLETS AT BEDTIME 180 tablet 1  . baclofen (LIORESAL) 10 MG tablet     . benzonatate (TESSALON) 100 MG capsule Take 1 capsule (100 mg total) by mouth every 8 (eight) hours. 21 capsule 0  . Blood Glucose Calibration (ACCU-CHEK SMARTVIEW CONTROL)  LIQD To be used to calibrate glucose meter as needed. 1 each prn  . Blood Glucose Monitoring Suppl (ACCU-CHEK NANO SMARTVIEW) w/Device KIT Check fasting blood sugars every morning and once after largest meal of the day a few x a week. DX:R73.01 1 kit 0  . cetirizine (ZYRTEC ALLERGY) 10 MG tablet Take 1 tablet (10 mg total) by mouth daily. 30 tablet 0  . Cyanocobalamin (VITAMIN B-12 PO) Take 1 tablet by mouth every Wednesday.    Marland Kitchen desvenlafaxine (PRISTIQ) 100 MG 24 hr tablet Take 1 tablet (100 mg total) by mouth daily. 90 tablet 3  . Docusate Sodium (COLACE PO)     . EPINEPHrine 0.3 mg/0.3 mL IJ SOAJ injection Inject 0.3 mLs (0.3 mg total) into the muscle once. 2 Device prn  . gabapentin (NEURONTIN) 300 MG capsule TAKE 2 CAPSULES (600 MG TOTAL) BY MOUTH DAILY. 180 capsule 1  . glucose blood test strip Accu-chek smartview test strip For testing blood sugars 2 times daily. DX:R73.01 300 each 4  .  ibuprofen (ADVIL,MOTRIN) 600 MG tablet Take 1 tablet (600 mg total) by mouth every 6 (six) hours as needed. 30 tablet 0  . metFORMIN (GLUCOPHAGE-XR) 500 MG 24 hr tablet TAKE 1 TABLET (500 MG TOTAL) BY MOUTH DAILY WITH BREAKFAST. 90 tablet 3  . metoCLOPramide (REGLAN) 10 MG tablet Take 1 tablet (10 mg total) by mouth every 6 (six) hours. 30 tablet 0  . metoprolol succinate (TOPROL-XL) 25 MG 24 hr tablet TAKE 1 TABLET EVERY DAY 90 tablet 1  . Multiple Vitamin (MULTIVITAMIN) tablet Take 1 tablet by mouth 2 (two) times daily.     Marland Kitchen nystatin (NYSTATIN) powder Apply as needed to affected areas. 3 Bottle 4  . omeprazole (PRILOSEC) 40 MG capsule Take 1 capsule (40 mg total) by mouth daily. 90 capsule 3  . ondansetron (ZOFRAN-ODT) 4 MG disintegrating tablet Take 1 tablet (4 mg total) by mouth every 8 (eight) hours as needed for nausea or vomiting. 60 tablet 4  . Oxycodone HCl 10 MG TABS     . Teriparatide, Recombinant, (FORTEO) 600 MCG/2.4ML SOLN Inject 20 mcg into the skin. Prescribed by Dr Rennis Harding.     Current Facility-Administered Medications  Medication Dose Route Frequency Provider Last Rate Last Dose  . 0.9 %  sodium chloride infusion  500 mL Intravenous Once Gatha Mayer, MD       Allergies  Allergen Reactions  . Bee Venom Anaphylaxis  . Loratadine Itching    REACTION: dizziness  . Nsaids Other (See Comments)    GI intolerance.    . Latex Rash       Objective:  BP (!) 148/82   Pulse 62   Temp 98.9 F (37.2 C) (Oral)   Wt 189 lb (85.7 kg)   SpO2 96%   BMI 31.45 kg/m  Gen:  alert, ill-appearing, not toxic-appearing, no distress, appropriate for age 53: head normocephalic without obvious abnormality, conjunctiva and cornea clear, wearing glasses, nares dry and red, nasal mucosa edematous, oropharynx with mild erythema, tonsils absent, neck supple, no cervical adenopathy, trachea midline Pulm: Normal work of breathing, normal phonation, clear to auscultation bilaterally, no  wheezes, rales or rhonchi; bronchitic cough CV: Normal rate, regular rhythm, s1 and s2 distinct, no murmurs, clicks or rubs  Neuro: alert and oriented x 3, no tremor MSK: extremities atraumatic, normal gait and station Skin: intact, no rashes on exposed skin, no jaundice, no cyanosis     No results found for this or any  previous visit (from the past 72 hour(s)). Dg Chest 2 View  Result Date: 01/08/2018 CLINICAL DATA:  Productive cough and fever. EXAM: CHEST - 2 VIEW COMPARISON:  12/04/2017 and 11/27/2016 FINDINGS: The cardiomediastinal silhouette is within normal limits. No airspace consolidation, edema, pleural effusion, or pneumothorax is identified. Partially visualized lumbar spinal fusion. Prior L1 vertebral augmentation. Spinal stimulator terminates in the midthoracic spine. IMPRESSION: No active cardiopulmonary disease. Electronically Signed   By: Logan Bores M.D.   On: 01/08/2018 19:25      Assessment and Plan: 62 y.o. female with   1. Influenza-like illness - vitals reviewed and stable, relative hypoxia at 96%, respirations are unlabored, no adventitious lung sounds. No evidence of pneumonia on recent CXR. No hypo/hyperglycemia. - symptomatic care, adding expectorant - guaiFENesin (MUCINEX) 600 MG 12 hr tablet; Take 2 tablets (1,200 mg total) by mouth 2 (two) times daily.   Patient education and anticipatory guidance given Patient agrees with treatment plan Follow-up as needed if symptoms worsen or fail to improve  Darlyne Russian PA-C

## 2018-01-11 NOTE — Telephone Encounter (Addendum)
Called to reorder pt's medication and was informed that her enrollment has expired. It expired on 10/22/2017.Maryruth Eve, [place] Crocker, CMA

## 2018-01-16 NOTE — Telephone Encounter (Signed)
Called pt to see if she received my vm regarding her medication. She stated that she did. Her husband has an appointment tomorrow and she will bring in her part of her forms.Marland KitchenMarland KitchenElouise Munroe, [place]

## 2018-01-23 ENCOUNTER — Telehealth: Payer: Self-pay | Admitting: *Deleted

## 2018-01-23 NOTE — Telephone Encounter (Signed)
Pt assistance forms completed,faxed,confirmation received,copied, and scanned.Theresa Scott, [place]

## 2018-04-10 DIAGNOSIS — M5116 Intervertebral disc disorders with radiculopathy, lumbar region: Secondary | ICD-10-CM | POA: Diagnosis not present

## 2018-04-10 DIAGNOSIS — Z79891 Long term (current) use of opiate analgesic: Secondary | ICD-10-CM | POA: Diagnosis not present

## 2018-04-10 DIAGNOSIS — M545 Low back pain: Secondary | ICD-10-CM | POA: Diagnosis not present

## 2018-04-10 DIAGNOSIS — Z79899 Other long term (current) drug therapy: Secondary | ICD-10-CM | POA: Diagnosis not present

## 2018-04-10 DIAGNOSIS — G894 Chronic pain syndrome: Secondary | ICD-10-CM | POA: Diagnosis not present

## 2018-04-11 ENCOUNTER — Other Ambulatory Visit: Payer: Self-pay | Admitting: *Deleted

## 2018-04-11 DIAGNOSIS — R7301 Impaired fasting glucose: Secondary | ICD-10-CM

## 2018-04-11 MED ORDER — ACCU-CHEK FASTCLIX LANCETS MISC: 4 refills | Status: DC

## 2018-04-18 ENCOUNTER — Other Ambulatory Visit: Payer: Self-pay | Admitting: Family Medicine

## 2018-04-18 ENCOUNTER — Telehealth: Payer: Self-pay

## 2018-04-18 NOTE — Telephone Encounter (Signed)
Pt called stating that [facility] reorders her Pristiq from patient assistance for her and she is due for a refill.   Tonya, I advised pt I would notify you to obtain her refills.   Thanks!

## 2018-04-18 NOTE — Telephone Encounter (Signed)
Called and placed refill for Pristiq 100 mg  Customer ID # [id number]  Order # [id number]  Order to arrive in 7-10 days, next order can be placed Tuesday 06/25/2018.Elouise Munroe, [place]

## 2018-04-18 NOTE — Telephone Encounter (Signed)
lvm informing pt that her order has been placed.Theresa Scott [place]

## 2018-04-19 ENCOUNTER — Ambulatory Visit: Payer: Medicare HMO | Admitting: Family Medicine

## 2018-05-14 ENCOUNTER — Ambulatory Visit (INDEPENDENT_AMBULATORY_CARE_PROVIDER_SITE_OTHER): Payer: Medicare HMO | Admitting: Family Medicine

## 2018-05-14 ENCOUNTER — Encounter: Payer: Self-pay | Admitting: Family Medicine

## 2018-05-14 VITALS — BP 119/65 | HR 68 | Ht 65.0 in | Wt 187.0 lb

## 2018-05-14 DIAGNOSIS — K7400 Hepatic fibrosis, unspecified: Secondary | ICD-10-CM

## 2018-05-14 DIAGNOSIS — R7301 Impaired fasting glucose: Secondary | ICD-10-CM | POA: Diagnosis not present

## 2018-05-14 DIAGNOSIS — I700 Atherosclerosis of aorta: Secondary | ICD-10-CM | POA: Diagnosis not present

## 2018-05-14 DIAGNOSIS — K760 Fatty (change of) liver, not elsewhere classified: Secondary | ICD-10-CM

## 2018-05-14 LAB — POCT GLYCOSYLATED HEMOGLOBIN (HGB A1C): Hemoglobin A1C: 5.9 % — AB (ref 4.0–5.6)

## 2018-05-14 NOTE — Progress Notes (Signed)
Subjective:    CC: IFG, liver check  HPI:  Impaired fasting glucose-no increased thirst or urination. Twice last week she has felt lightheaded, shaky and diaphoretic 2 hours after she ate food. She said felt like hypoglycemic events. She never checked them but says she can tell they were low. She did say she always has a snack with her just in case that happens. No problems this week.    Follow-up nonalcoholic fatty liver disease suspect cirrhosis-last evaluation was in 2017 with Dr. Carlean Purl.  She has not followed up since then.  She is not having any pain or problems.  Aortic calcification-noted on abdominal CT from 2017.  Her blood pressure and cholesterol are well controlled though she is not on a statin.   Past medical history, Surgical history, Family history not pertinant except as noted below, Social history, Allergies, and medications have been entered into the medical record, reviewed, and corrections made.   Review of Systems: No fevers, chills, night sweats, weight loss, chest pain, or shortness of breath.   Objective:    General: Well Developed, well nourished, and in no acute distress.  Neuro: Alert and oriented x3, extra-ocular muscles intact, sensation grossly intact.  HEENT: Normocephalic, atraumatic  Skin: Warm and dry, no rashes. Cardiac: Regular rate and rhythm, no murmurs rubs or gallops, no lower extremity edema.  Respiratory: Clear to auscultation bilaterally. Not using accessory muscles, speaking in full sentences.   Impression and Recommendations:    IFG - Pt is on Metformin. Pt encouraged to let us know if she is continuing to have hypoglycemic events. She did mention eating less for weight loss and better A1c control. We may decrease Metformin if this continues to happen. Instructed pt to check her BG when she is experiencing the lows to get an accurate reading. Plan discussed with pt. Will follow up.   Pt up to date on eye exam and colonoscopy.   Fatty  liver, NA - Need to start Twinrix vaccine.  Will get updated ultrasound with elastography for now and then consider starting the vaccination series if there is still evidence of possible early cirrhosis.  Calcification-continue to monitor blood pressure as well as cholesterol levels.  Consider statin in the future if needed.

## 2018-05-15 LAB — HEPATIC FUNCTION PANEL
AG Ratio: 1.6 (calc) (ref 1.0–2.5)
ALT: 22 U/L (ref 6–29)
AST: 29 U/L (ref 10–35)
Albumin: 4.2 g/dL (ref 3.6–5.1)
Alkaline phosphatase (APISO): 93 U/L (ref 33–130)
Bilirubin, Direct: 0.1 mg/dL (ref 0.0–0.2)
Globulin: 2.7 g/dL (calc) (ref 1.9–3.7)
Indirect Bilirubin: 0.3 mg/dL (calc) (ref 0.2–1.2)
Total Bilirubin: 0.4 mg/dL (ref 0.2–1.2)
Total Protein: 6.9 g/dL (ref 6.1–8.1)

## 2018-05-15 LAB — SPECIMEN COMPROMISED

## 2018-05-31 ENCOUNTER — Ambulatory Visit
Admission: RE | Admit: 2018-05-31 | Discharge: 2018-05-31 | Disposition: A | Discharge status: Home / Self Care | Payer: Medicare HMO | Source: Ambulatory Visit | Attending: Family Medicine | Admitting: Family Medicine

## 2018-05-31 DIAGNOSIS — K760 Fatty (change of) liver, not elsewhere classified: Secondary | ICD-10-CM | POA: Diagnosis not present

## 2018-05-31 DIAGNOSIS — K7400 Hepatic fibrosis, unspecified: Secondary | ICD-10-CM

## 2018-05-31 DIAGNOSIS — K7581 Nonalcoholic steatohepatitis (NASH): Secondary | ICD-10-CM | POA: Diagnosis not present

## 2018-06-13 ENCOUNTER — Other Ambulatory Visit: Payer: Self-pay | Admitting: Family Medicine

## 2018-06-21 DIAGNOSIS — H40033 Anatomical narrow angle, bilateral: Secondary | ICD-10-CM | POA: Diagnosis not present

## 2018-06-21 DIAGNOSIS — E119 Type 2 diabetes mellitus without complications: Secondary | ICD-10-CM | POA: Diagnosis not present

## 2018-06-24 ENCOUNTER — Other Ambulatory Visit: Payer: Self-pay | Admitting: Family Medicine

## 2018-07-02 DIAGNOSIS — G894 Chronic pain syndrome: Secondary | ICD-10-CM | POA: Diagnosis not present

## 2018-07-02 DIAGNOSIS — M545 Low back pain: Secondary | ICD-10-CM | POA: Diagnosis not present

## 2018-07-02 DIAGNOSIS — M5116 Intervertebral disc disorders with radiculopathy, lumbar region: Secondary | ICD-10-CM | POA: Diagnosis not present

## 2018-07-04 ENCOUNTER — Ambulatory Visit: Payer: Medicare HMO

## 2018-07-05 ENCOUNTER — Ambulatory Visit (INDEPENDENT_AMBULATORY_CARE_PROVIDER_SITE_OTHER): Payer: Medicare HMO

## 2018-07-05 DIAGNOSIS — Z1231 Encounter for screening mammogram for malignant neoplasm of breast: Secondary | ICD-10-CM

## 2018-07-14 ENCOUNTER — Emergency Department
Admission: EM | Admit: 2018-07-14 | Discharge: 2018-07-14 | Disposition: A | Discharge status: Home / Self Care | Payer: Medicare HMO | Source: Home / Self Care | Attending: Family Medicine | Admitting: Family Medicine

## 2018-07-14 ENCOUNTER — Encounter: Payer: Self-pay | Admitting: Emergency Medicine

## 2018-07-14 DIAGNOSIS — J069 Acute upper respiratory infection, unspecified: Secondary | ICD-10-CM | POA: Diagnosis not present

## 2018-07-14 DIAGNOSIS — H6121 Impacted cerumen, right ear: Secondary | ICD-10-CM

## 2018-07-14 DIAGNOSIS — H6691 Otitis media, unspecified, right ear: Secondary | ICD-10-CM

## 2018-07-14 MED ORDER — AMOXICILLIN 500 MG PO CAPS: 500.0000 mg | ORAL_CAPSULE | Freq: Three times a day (TID) | ORAL | 0 refills | Status: DC

## 2018-07-14 NOTE — Discharge Instructions (Signed)
°  Please take antibiotics as prescribed and be sure to complete entire course even if you start to feel better to ensure infection does not come back.  You may take 500mg  acetaminophen every 4-6 hours or in combination with ibuprofen 400-600mg  every 6-8 hours as needed for pain, inflammation, and fever.  Be sure to drink water to stay well hydrated and get at least 8 hours of sleep at night, preferably more while sick.   Please follow up with family medicine in 1 week if not improving.

## 2018-07-14 NOTE — ED Provider Notes (Signed)
[name]    CSN: [id number] Arrival date & time: 07/14/18  1324     History   Chief Complaint Chief Complaint  Patient presents with  . URI    HPI Theresa Scott is a 62 y.o. female.   HPI  Theresa Scott is a 62 y.o. female presenting to UC with c/o sore throat and nonproductive cough for 4 days. Associated fatigue, HA, and Right ear pain. She has taken Zyrtec and ibuprofen w/o much relief. Her son is also to be seen at [facility] for similar symptoms. His symptoms started first. No n/v/d.   Past Medical History:  Diagnosis Date  . Chronic kidney disease   . Complication of anesthesia    hard to wake up and swelling after kidney stone removal in 1980 per pt  . Degenerative disc disease   . Depression   . Diabetes mellitus   . Fatty liver disease, nonalcoholic   . Gallstones   . GERD (gastroesophageal reflux disease)   . Hypercholesterolemia   . Hypertension   . Kidney stones   . Obesity   . Osteoporosis   . Pancreatitis     Patient Active Problem List   Diagnosis Date Noted  . NAFLD (nonalcoholic fatty liver disease) 12/04/2016  . Liver fibrosis ([place]) - suspected 12/04/2016  . Aortic calcification ([employer]) 04/20/2016  . 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 Daytime somnolence 06/26/2013  . Sleep disorder, circadian 06/26/2013  . History of gastric bypass, 08/22/2010. 11/03/2011  . IMPAIRED FASTING GLUCOSE 12/15/2010  . ANXIETY 01/31/2010  . PSVT 12/01/2009  . STASIS DERMATITIS 04/06/2009  . ALLERGIC RHINITIS 08/19/2008  . PERIPHERAL EDEMA 08/12/2008  . DIABETIC PERIPHERAL NEUROPATHY 07/20/2008  . Hyperlipemia 06/18/2008  . OBESITY 06/18/2008  . Major depression, chronic 06/18/2008  . Peripheral vascular disease, unspecified ([place]) 06/18/2008  . GERD 06/18/2008  . OSTEOARTHRITIS 06/18/2008  . INSOMNIA 06/18/2008  . INCONTINENCE 06/18/2008  . PROTEINURIA 06/18/2008  . NEPHROLITHIASIS, HX OF 06/18/2008    Past Surgical History:   Procedure Laterality Date  . BACK SURGERY  1990,2003,2004,2010  . CARDIAC CATHETERIZATION  2011   [place] [facility].   Marland Kitchen CARDIAC SURGERY  2002   ablation  . CHOLECYSTECTOMY  1976  . COLONOSCOPY  2008  . ESOPHAGOGASTRODUODENOSCOPY N/A 01/27/2013   Procedure: ESOPHAGOGASTRODUODENOSCOPY (EGD) upper endoscopy;  Surgeon: Shann Medal, MD;  Location: [facility];  Service: General;  Laterality: N/A;  . 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 HAND SURGERY  2000   right  . TONSILLECTOMY  1971    OB History   None      Home Medications    Prior to Admission medications   Medication Sig Start Date End Date Taking? Authorizing Provider  ACCU-CHEK FASTCLIX LANCETS MISC For testing blood sugars 1-2 times daily. DX: R73.01 04/11/18   Hali Marry, MD  acetaminophen (TYLENOL) 500 MG tablet Take 1 tablet (500 mg total) by mouth every 6 (six) hours as needed. 01/08/18   Law, Bea Graff, PA-C  Alcohol Swabs (B-D SINGLE USE SWABS REGULAR) PADS To be used when testing blood sugars 1-2 daily. DX: R73.01 07/11/17   Hali Marry, MD  amoxicillin (AMOXIL) 500 MG capsule Take 1 capsule (500 mg total) by mouth 3 (three) times daily. 07/14/18   Noe Gens, PA-C  baclofen (LIORESAL) 10 MG tablet  10/05/15   [provider]  Blood Glucose Calibration (ACCU-CHEK SMARTVIEW CONTROL) LIQD To be used to calibrate glucose meter as  needed. 07/05/17   Hali Marry, MD  Blood Glucose Monitoring Suppl (ACCU-CHEK NANO SMARTVIEW) w/Device KIT Check fasting blood sugars every morning and once after largest meal of the day a few x a week. DX:R73.01 07/10/17   Hali Marry, MD  cetirizine (ZYRTEC ALLERGY) 10 MG tablet Take 1 tablet (10 mg total) by mouth daily. 01/08/18   Law, Bea Graff, PA-C  Cyanocobalamin (VITAMIN B-12 PO) Take 1 tablet by mouth every Wednesday.    [provider]  desvenlafaxine (PRISTIQ) 100 MG 24 hr tablet Take 1 tablet (100 mg total) by mouth daily. 12/26/16    Hali Marry, MD  Docusate Sodium (COLACE PO)  06/21/16   [provider]  EPINEPHrine 0.3 mg/0.3 mL IJ SOAJ injection Inject 0.3 mLs (0.3 mg total) into the muscle once. 12/17/15   Hali Marry, MD  gabapentin (NEURONTIN) 300 MG capsule TAKE 2 CAPSULES (600 MG TOTAL) BY MOUTH DAILY. 06/14/18   Hali Marry, MD  glucose blood test strip Accu-chek smartview test strip For testing blood sugars 2 times daily. DX:R73.01 08/03/17   Hali Marry, MD  guaiFENesin (MUCINEX) 600 MG 12 hr tablet Take 2 tablets (1,200 mg total) by mouth 2 (two) times daily. 01/11/18   Trixie Dredge, PA-C  ibuprofen (ADVIL,MOTRIN) 600 MG tablet Take 1 tablet (600 mg total) by mouth every 6 (six) hours as needed. 01/08/18   Law, Bea Graff, PA-C  metFORMIN (GLUCOPHAGE-XR) 500 MG 24 hr tablet TAKE 1 TABLET (500 MG TOTAL) BY MOUTH DAILY WITH BREAKFAST. 06/25/18   Hali Marry, MD  metoCLOPramide (REGLAN) 10 MG tablet Take 1 tablet (10 mg total) by mouth every 6 (six) hours. 01/08/18   Law, Bea Graff, PA-C  metoprolol succinate (TOPROL-XL) 25 MG 24 hr tablet TAKE 1 TABLET EVERY DAY 11/22/17   Hali Marry, MD  Multiple Vitamin (MULTIVITAMIN) tablet Take 1 tablet by mouth 2 (two) times daily.     [provider]  omeprazole (PRILOSEC) 40 MG capsule Take 1 capsule (40 mg total) by mouth daily. 07/05/17   Hali Marry, MD  ondansetron (ZOFRAN-ODT) 4 MG disintegrating tablet DISSOLVE 1 TABLET ON THE TONGUE EVERY 8 HOURS AS NEEDED FOR NAUSEA AND VOMITING 04/18/18   Hali Marry, MD    Family History Family History  Problem Relation Age of Onset  . Lung cancer Mother   . Alcohol abuse Mother   . Colon cancer Brother        late 40's  . Liver cancer Brother   . Alcohol abuse Brother   . Cirrhosis Sister        secondary medication  . Diabetes Sister   . Kidney failure Brother        Dialysis at 65, born single kidney  . Esophageal  cancer Neg Hx   . Stomach cancer Neg Hx   . Rectal cancer Neg Hx     Social History Social History   Tobacco Use  . Smoking status: Former Smoker    Packs/day: 0.50    Years: 18.00    Pack years: 9.00    Last attempt to quit: 08/04/1990    Years since quitting: 27.9  . Smokeless tobacco: Never Used  Substance Use Topics  . Alcohol use: No  . Drug use: No     Allergies   Bee venom; Loratadine; Nsaids; and Latex   Review of Systems Review of Systems  Constitutional: Negative for chills and fever.  HENT: Positive  for congestion, ear pain and sore throat. Negative for trouble swallowing and voice change.   Respiratory: Positive for cough. Negative for shortness of breath.   Cardiovascular: Negative for chest pain and palpitations.  Gastrointestinal: Negative for abdominal pain, diarrhea, nausea and vomiting.  Musculoskeletal: Negative for arthralgias, back pain and myalgias.  Skin: Negative for rash.  Neurological: Positive for headaches.     Physical Exam Triage Vital Signs ED Triage Vitals  Enc Vitals Group     BP 07/14/18 1354 127/74     Pulse Rate 07/14/18 1354 63     Resp --      Temp 07/14/18 1354 98.4 F (36.9 C)     Temp Source 07/14/18 1354 Oral     SpO2 07/14/18 1354 96 %     Weight 07/14/18 1355 187 lb 8 oz (85 kg)     Height 07/14/18 1355 _0  (1.676 m)     Head Circumference --      Peak Flow --      Pain Score 07/14/18 1354 0     Pain Loc --      Pain Edu? --      Excl. in [place]? --    No data found.  Updated Vital Signs BP 127/74 (BP Location: Right Arm)   Pulse 63   Temp 98.4 F (36.9 C) (Oral)   Ht _1  (1.676 m)   Wt 187 lb 8 oz (85 kg)   SpO2 96%   BMI 30.26 kg/m   Visual Acuity Right Eye Distance:   Left Eye Distance:   Bilateral Distance:    Right Eye Near:   Left Eye Near:    Bilateral Near:     Physical Exam  Constitutional: She is oriented to person, place, and time. She appears well-developed and well-nourished. No  distress.  HENT:  Head: Normocephalic and atraumatic.  Right Ear: Tympanic membrane is erythematous. Tympanic membrane is not bulging. A middle ear effusion is present.  Left Ear: Tympanic membrane normal.  Nose: Nose normal.  Mouth/Throat: Uvula is midline, oropharynx is clear and moist and mucous membranes are normal.  Right ear: partially occluded with cerumen. Once cerumen removed- revealed erythematous TM with effusion.  Eyes: EOM are normal.  Neck: Normal range of motion. Neck supple.  Cardiovascular: Normal rate and regular rhythm.  Pulmonary/Chest: Effort normal and breath sounds normal. No stridor. No respiratory distress. She has no wheezes. She has no rales.  Musculoskeletal: Normal range of motion.  Neurological: She is alert and oriented to person, place, and time.  Skin: Skin is warm and dry. She is not diaphoretic.  Psychiatric: She has a normal mood and affect. Her behavior is normal.  Nursing note and vitals reviewed.    UC Treatments / Results  Labs (all labs ordered are listed, but only abnormal results are displayed) Labs Reviewed - No data to display  EKG None  Radiology No results found.  Procedures Procedures (including critical care time)  Medications Ordered in UC Medications - No data to display  Initial Impression / Assessment and Plan / UC Course  I have reviewed the triage vital signs and the nursing notes.  Pertinent labs & imaging results that were available during my care of the patient were reviewed by me and considered in my medical decision making (see chart for details).     Hx and exam cw/ Right AOM secondary to URI.  Will tx with antibiotics.  Final Clinical Impressions(s) / UC Diagnoses  Final diagnoses:  Upper respiratory tract infection, unspecified type  Right acute otitis media  Right ear impacted cerumen     Discharge Instructions      Please take antibiotics as prescribed and be sure to complete entire course  even if you start to feel better to ensure infection does not come back.  You may take 577m acetaminophen every 4-6 hours or in combination with ibuprofen 400-6022mevery 6-8 hours as needed for pain, inflammation, and fever.  Be sure to drink water to stay well hydrated and get at least 8 hours of sleep at night, preferably more while sick.   Please follow up with family medicine in 1 week if not improving.       ED Prescriptions    Medication Sig Dispense Auth. Provider   amoxicillin (AMOXIL) 500 MG capsule Take 1 capsule (500 mg total) by mouth 3 (three) times daily. 21 capsule PhNoe GensPA-C     Controlled Substance Prescriptions [place] Controlled Substance Registry consulted? Not Applicable   PhTyrell Antonio9/25/19 [id number]

## 2018-07-14 NOTE — ED Triage Notes (Signed)
Patient c/o sore throat, non-productive cough, fatigue x 4 days, tried OTC Zyrtec and Ibuprofen.

## 2018-07-15 LAB — HM DIABETES EYE EXAM

## 2018-07-18 ENCOUNTER — Encounter: Payer: Self-pay | Admitting: Physician Assistant

## 2018-07-18 ENCOUNTER — Ambulatory Visit (INDEPENDENT_AMBULATORY_CARE_PROVIDER_SITE_OTHER): Payer: Medicare HMO | Admitting: Physician Assistant

## 2018-07-18 VITALS — BP 130/68 | HR 64 | Temp 97.7°F | Ht 66.0 in | Wt 188.0 lb

## 2018-07-18 DIAGNOSIS — H9201 Otalgia, right ear: Secondary | ICD-10-CM | POA: Diagnosis not present

## 2018-07-18 DIAGNOSIS — H66001 Acute suppurative otitis media without spontaneous rupture of ear drum, right ear: Secondary | ICD-10-CM

## 2018-07-18 MED ORDER — AZITHROMYCIN 250 MG PO TABS: ORAL_TABLET | ORAL | 0 refills | Status: DC

## 2018-07-18 MED ORDER — METHYLPREDNISOLONE 4 MG PO TBPK: ORAL_TABLET | ORAL | 0 refills | Status: DC

## 2018-07-18 NOTE — Patient Instructions (Signed)
Eustachian Tube Dysfunction The eustachian tube connects the middle ear to the back of the nose. It regulates air pressure in the middle ear by allowing air to move between the ear and nose. It also helps to drain fluid from the middle ear space. When the eustachian tube does not function properly, air pressure, fluid, or both can build up in the middle ear. Eustachian tube dysfunction can affect one or both ears. What are the causes? This condition happens when the eustachian tube becomes blocked or cannot open normally. This may result from:  Ear infections.  Colds and other upper respiratory infections.  Allergies.  Irritation, such as from cigarette smoke or acid from the stomach coming up into the esophagus (gastroesophageal reflux).  Sudden changes in air pressure, such as from descending in an airplane.  Abnormal growths in the nose or throat, such as nasal polyps, tumors, or enlarged tissue at the back of the throat (adenoids).  What increases the risk? This condition may be more likely to develop in people who smoke and people who are overweight. Eustachian tube dysfunction may also be more likely to develop in children, especially children who have:  Certain birth defects of the mouth, such as cleft palate.  Large tonsils and adenoids.  What are the signs or symptoms? Symptoms of this condition may include:  A feeling of fullness in the ear.  Ear pain.  Clicking or popping noises in the ear.  Ringing in the ear.  Hearing loss.  Loss of balance.  Symptoms may get worse when the air pressure around you changes, such as when you travel to an area of high elevation or fly on an airplane. How is this diagnosed? This condition may be diagnosed based on:  Your symptoms.  A physical exam of your ear, nose, and throat.  Tests, such as those that measure: ? The movement of your eardrum (tympanogram). ? Your hearing (audiometry).  How is this treated? Treatment  depends on the cause and severity of your condition. If your symptoms are mild, you may be able to relieve your symptoms by moving air into ("popping") your ears. If you have symptoms of fluid in your ears, treatment may include:  Decongestants.  Antihistamines.  Nasal sprays or ear drops that contain medicines that reduce swelling (steroids).  In some cases, you may need to have a procedure to drain the fluid in your eardrum (myringotomy). In this procedure, a small tube is placed in the eardrum to:  Drain the fluid.  Restore the air in the middle ear space.  Follow these instructions at home:  Take over-the-counter and prescription medicines only as told by your health care provider.  Use techniques to help pop your ears as recommended by your health care provider. These may include: ? Chewing gum. ? Yawning. ? Frequent, forceful swallowing. ? Closing your mouth, holding your nose closed, and gently blowing as if you are trying to blow air out of your nose.  Do not do any of the following until your health care provider approves: ? Travel to high altitudes. ? Fly in airplanes. ? Work in a pressurized cabin or room. ? Scuba dive.  Keep your ears dry. Dry your ears completely after showering or bathing.  Do not smoke.  Keep all follow-up visits as told by your health care provider. This is important. Contact a health care provider if:  Your symptoms do not go away after treatment.  Your symptoms come back after treatment.  You are   unable to pop your ears.  You have: ? A fever. ? Pain in your ear. ? Pain in your head or neck. ? Fluid draining from your ear.  Your hearing suddenly changes.  You become very dizzy.  You lose your balance. This information is not intended to replace advice given to you by your health care provider. Make sure you discuss any questions you have with your health care provider. Document Released: 11/05/2015 Document Revised: 03/16/2016  Document Reviewed: 10/28/2014 Elsevier Interactive Patient Education  2018 Elsevier Inc.  

## 2018-07-18 NOTE — Progress Notes (Signed)
Subjective:     Patient ID: Theresa Scott, female   DOB: 08/13/1956, 62 y.o.   MRN: [id number]  HPI Patient is a 62 yo patient presenting today complaining of right ear pain. She was seen in Urgent Care on Sunday (07/14/18) and was diagnosed with acute otitis media in her right ear, a URI, and impacted cerumen of the right ear. She had her ear cleaned out in Urgent Care and was prescribed amoxicillin for the AOM. She has been taking the antibiotic as prescribed 3 times a day. She states that she still has ear pain and it does not seem to be improving. She has been flushing her ear with ear wax remover and hydrogen peroxide and water. She complains of feeling like her right ear is full, and that it "needs to be popped." She denies fever, or fatigue. She states she has a cough which is improving, but denies SOB or chest pain. She states that her URI symptoms are improving, but does complain of some residual rhinorrhea and postnasal drip. She also complains of a headache in the back of her head. She states her son and grandson both have strep throat.   .. Active Ambulatory Problems    Diagnosis Date Noted  . DIABETIC PERIPHERAL NEUROPATHY 07/20/2008  . Hyperlipemia 06/18/2008  . OBESITY 06/18/2008  . ANXIETY 01/31/2010  . Major depression, chronic 06/18/2008  . PSVT 12/01/2009  . Peripheral vascular disease, unspecified ([place]) 06/18/2008  . STASIS DERMATITIS 04/06/2009  . ALLERGIC RHINITIS 08/19/2008  . GERD 06/18/2008  . OSTEOARTHRITIS 06/18/2008  . INSOMNIA 06/18/2008  . PERIPHERAL EDEMA 08/12/2008  . INCONTINENCE 06/18/2008  . PROTEINURIA 06/18/2008  . NEPHROLITHIASIS, HX OF 06/18/2008  . IMPAIRED FASTING GLUCOSE 12/15/2010  . History of gastric bypass, 08/22/2010. 11/03/2011  . Daytime somnolence 06/26/2013  . Sleep disorder, circadian 06/26/2013  . 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 Aortic calcification ([place]) 04/20/2016  . NAFLD (nonalcoholic fatty liver  disease) 12/04/2016  . Liver fibrosis ([place]) - suspected 12/04/2016   Resolved Ambulatory Problems    Diagnosis Date Noted  . HYPERTENSION 06/18/2008  . BRONCHITIS, CHRONIC 08/19/2008  . REACTIVE AIRWAY DISEASE 08/19/2008  . Backache 06/18/2008  . Palpitations 11/25/2009  . CHEST PAIN 01/31/2010  . NONSPEC ELEVATION OF LEVELS OF TRANSAMINASE/LDH 07/20/2008  . FOOT PAIN 12/15/2010  . IFG (impaired fasting glucose) 06/20/2012  . Chest pain 08/18/2013  . Pancreatitis 03/30/2016   Past Medical History:  Diagnosis Date  . Chronic kidney disease   . Complication of anesthesia   . Degenerative disc disease   . Depression   . Diabetes mellitus   . Fatty liver disease, nonalcoholic   . Gallstones   . GERD (gastroesophageal reflux disease)   . Hypercholesterolemia   . Hypertension   . Kidney stones   . Obesity   . Osteoporosis      Review of Systems  Constitutional: Negative for chills and fever.  HENT: Positive for ear pain, postnasal drip and rhinorrhea. Negative for sinus pressure and sinus pain.   Respiratory: Positive for cough. Negative for shortness of breath.   Cardiovascular: Negative for chest pain.  Neurological: Positive for headaches.       Objective:   Physical Exam  Constitutional: She is oriented to person, place, and time. She appears well-developed and well-nourished.  HENT:  Head: Normocephalic and atraumatic.  Right Ear: External ear normal. Tympanic membrane is not bulging. A middle ear effusion is present.  Left Ear: Tympanic membrane, external  ear and ear canal normal.  Nose: Rhinorrhea present.  Mouth/Throat: Oropharynx is clear and moist. No posterior oropharyngeal erythema.  Effusion present in right ear. Canal of R ear irritated. Some tenderness to palpation behind and below R ear.   Cardiovascular: Normal rate and regular rhythm.  Pulmonary/Chest: Effort normal and breath sounds normal.  Neurological: She is alert and oriented to person, place,  and time.  Psychiatric: She has a normal mood and affect. Her behavior is normal.       Assessment:     Marland KitchenMarland KitchenDiagnoses and all orders for this visit:  Right ear pain -     azithromycin (ZITHROMAX) 250 MG tablet; Take 2 tablets now and then one tablet for 4 days. -     methylPREDNISolone (MEDROL DOSEPAK) 4 MG TBPK tablet; Take as directed by package insert.  Non-recurrent acute suppurative otitis media of right ear without spontaneous rupture of tympanic membrane -     azithromycin (ZITHROMAX) 250 MG tablet; Take 2 tablets now and then one tablet for 4 days.       Plan:     Discussed with patient it is likely that the right ear is improving based on the level of the effusion and lack of redness or bulging of the TM. Explained that given her symptoms she likely has eustachian tube dysfunction which is contributing to the pressure felt within the ear. Prescribed Medrol Dosepak for eustachian tube dysfunction to help relieve the pressure. I would also start flonase 2 sprays each nostril once a day.   Instructed patient to finish amoxicillin unless symptoms do not improve or worsen, in which case patient should fill azithromycin prescription written today and begin taking azithromycin.    Marland KitchenVernetta Honey PA-C, have reviewed and agree with the above documentation in it's entirety.

## 2018-07-24 ENCOUNTER — Telehealth: Payer: Self-pay

## 2018-07-24 NOTE — Telephone Encounter (Signed)
Pt called requesting RF process be started for her Pristiq. States Kenney Houseman takes care of this for her.   Note to [place].   Thanks !

## 2018-07-24 NOTE — Telephone Encounter (Signed)
Called and placed refill for Pristiq 100 mg, #90. Customer # [phone] Order # [id number]  Pt's shipment will arrive in 7-10 days, her next order can be placed Monday 09/30/2018.Theresa Scott, [place]

## 2018-07-30 ENCOUNTER — Encounter: Payer: Self-pay | Admitting: Internal Medicine

## 2018-07-30 ENCOUNTER — Ambulatory Visit: Payer: Medicare HMO | Admitting: Internal Medicine

## 2018-07-30 VITALS — BP 116/64 | HR 72 | Ht 67.0 in | Wt 187.0 lb

## 2018-07-30 DIAGNOSIS — K7400 Hepatic fibrosis, unspecified: Secondary | ICD-10-CM

## 2018-07-30 DIAGNOSIS — K760 Fatty (change of) liver, not elsewhere classified: Secondary | ICD-10-CM

## 2018-07-30 NOTE — Progress Notes (Signed)
Theresa Scott 62 y.o. 1956/03/06 [id number]  Assessment & Plan:   Encounter Diagnoses  Name Primary?  Marland Kitchen NAFLD (nonalcoholic fatty liver disease) Yes  . Liver fibrosis suspected    She has done all she can with respect to her liver fibrosis.  Note that hepatic elastography tends to overestimate.  I have calculated her fibrosis score using the fibrosis for calculator and it comes out the F2 or F3 which is less so.  At any rate we reviewed things I will see her again in a year I would check her LFTs about every 6 months.  She does not use much if any alcohol and hopefully the weight loss she is experienced with her bariatric surgery will help her.  Do think there is a chance for regression of fibrosis still and at least stabilization.  She does not appear to have cirrhosis.  It is very reasonable to do hepatitis A and B vaccines and she should also catch up on her pneumonia vaccines through Dr. Charise Carwin.  I appreciate the opportunity to care for this patient. CC: Hali Marry, MD    Subjective:   Chief Complaint: Fatty liver disease  HPI Patient is a very nice 61 year old white woman here with her grandson and an infant that she hopes to adopt.  She is status post bariatric surgery with successful weight loss.  She has a known diagnosis of nonalcoholic fatty liver disease.  Dr. Madilyn Fireman ordered a hepatic elastography recently. Did suggest that she had F3-F4 fibrosis  Wt Readings from Last 3 Encounters:  07/30/18 187 lb (84.8 kg)  07/18/18 188 lb (85.3 kg)  07/14/18 187 lb 8 oz (85 kg)   Test hepatic function panel July 2019 was normal  Allergies  Allergen Reactions  . Bee Venom Anaphylaxis  . Loratadine Itching    REACTION: dizziness  . Nsaids Other (See Comments)    GI intolerance.    . Latex Rash   Current Meds  Medication Sig  . ACCU-CHEK FASTCLIX LANCETS MISC For testing blood sugars 1-2 times daily. DX: R73.01  . acetaminophen (TYLENOL) 500 MG tablet Take  1 tablet (500 mg total) by mouth every 6 (six) hours as needed.  . Alcohol Swabs (B-D SINGLE USE SWABS REGULAR) PADS To be used when testing blood sugars 1-2 daily. DX: R73.01  . baclofen (LIORESAL) 10 MG tablet   . Blood Glucose Calibration (ACCU-CHEK SMARTVIEW CONTROL) LIQD To be used to calibrate glucose meter as needed.  . Blood Glucose Monitoring Suppl (ACCU-CHEK NANO SMARTVIEW) w/Device KIT Check fasting blood sugars every morning and once after largest meal of the day a few x a week. DX:R73.01  . cetirizine (ZYRTEC ALLERGY) 10 MG tablet Take 1 tablet (10 mg total) by mouth daily.  . Cyanocobalamin (VITAMIN B-12 PO) Take 1 tablet by mouth every Wednesday.  Marland Kitchen desvenlafaxine (PRISTIQ) 100 MG 24 hr tablet Take 1 tablet (100 mg total) by mouth daily.  Mariane Baumgarten Sodium (COLACE PO)   . EPINEPHrine 0.3 mg/0.3 mL IJ SOAJ injection Inject 0.3 mLs (0.3 mg total) into the muscle once.  . gabapentin (NEURONTIN) 300 MG capsule TAKE 2 CAPSULES (600 MG TOTAL) BY MOUTH DAILY.  Marland Kitchen glucose blood test strip Accu-chek smartview test strip For testing blood sugars 2 times daily. DX:R73.01  . ibuprofen (ADVIL,MOTRIN) 600 MG tablet Take 1 tablet (600 mg total) by mouth every 6 (six) hours as needed.  . metFORMIN (GLUCOPHAGE-XR) 500 MG 24 hr tablet TAKE 1 TABLET (500 MG TOTAL)  BY MOUTH DAILY WITH BREAKFAST.  Marland Kitchen metoCLOPramide (REGLAN) 10 MG tablet Take 1 tablet (10 mg total) by mouth every 6 (six) hours.  . metoprolol succinate (TOPROL-XL) 25 MG 24 hr tablet TAKE 1 TABLET EVERY DAY  . omeprazole (PRILOSEC) 40 MG capsule Take 1 capsule (40 mg total) by mouth daily.  . ondansetron (ZOFRAN-ODT) 4 MG disintegrating tablet DISSOLVE 1 TABLET ON THE TONGUE EVERY 8 HOURS AS NEEDED FOR NAUSEA AND VOMITING  . [DISCONTINUED] guaiFENesin (MUCINEX) 600 MG 12 hr tablet Take 2 tablets (1,200 mg total) by mouth 2 (two) times daily.  . [DISCONTINUED] methylPREDNISolone (MEDROL DOSEPAK) 4 MG TBPK tablet Take as directed by package  insert.  . [DISCONTINUED] Multiple Vitamin (MULTIVITAMIN) tablet Take 1 tablet by mouth 2 (two) times daily.    Current Facility-Administered Medications for the 07/30/18 encounter (Office Visit) with Gatha Mayer, MD  Medication  . 0.9 %  sodium chloride infusion   Past Medical History:  Diagnosis Date  . Chronic kidney disease   . Complication of anesthesia    hard to wake up and swelling after kidney stone removal in 1980 per pt  . Degenerative disc disease   . Depression   . Diabetes mellitus   . Fatty liver disease, nonalcoholic   . Gallstones   . GERD (gastroesophageal reflux disease)   . Hypercholesterolemia   . Hypertension   . Kidney stones   . Obesity   . Osteoporosis   . Pancreatitis    Past Surgical History:  Procedure Laterality Date  . BACK SURGERY  1990,2003,2004,2010  . CARDIAC CATHETERIZATION  2011   [facility].   Marland Kitchen CARDIAC SURGERY  2002   ablation  . CHOLECYSTECTOMY  1976  . COLONOSCOPY  2008  . ESOPHAGOGASTRODUODENOSCOPY N/A 01/27/2013   Procedure: ESOPHAGOGASTRODUODENOSCOPY (EGD) upper endoscopy;  Surgeon: Shann Medal, MD;  Location: [facility];  Service: General;  Laterality: N/A;  . 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 HAND SURGERY  2000   right  . TONSILLECTOMY  1971   Social History   Social History Narrative   Married 1 son one daughter   Disabled, chronic back pain, former [place] her.   No alcohol or caffeine tobacco   06/27/2016   Last update   family history includes Alcohol abuse in her brother and mother; Cirrhosis in her sister; Colon cancer in her brother; Diabetes in her sister; Kidney failure in her brother; Liver cancer in her brother; Lung cancer in her mother.   Review of Systems As above  Objective:   Physical Exam BP 116/64   Pulse 72   Ht _0  (1.702 m)   Wt 187 lb (84.8 kg)   BMI 29.29 kg/m  Overweight no acute distress Eyes anicteric The skin is without stigmata of chronic liver disease   loose skin on the abdomen but it is soft and nontender without hepatosplenomegaly or mass Lungs clear Heart sounds

## 2018-07-30 NOTE — Patient Instructions (Signed)
Great job with your weight loss, this will help your liver.   It's a good idea to get your Hepatitis A & B vaccines. Check with Dr Madilyn Fireman about this as well as to catch up on your pneumonia vaccines.   I appreciate the opportunity to care for you. Silvano Rusk, MD, [facility]

## 2018-08-06 ENCOUNTER — Encounter: Payer: Self-pay | Admitting: Physician Assistant

## 2018-08-06 ENCOUNTER — Ambulatory Visit (INDEPENDENT_AMBULATORY_CARE_PROVIDER_SITE_OTHER): Payer: Medicare HMO | Admitting: Physician Assistant

## 2018-08-06 VITALS — BP 131/80 | HR 65 | Temp 98.0°F | Wt 187.0 lb

## 2018-08-06 DIAGNOSIS — H6192 Disorder of left external ear, unspecified: Secondary | ICD-10-CM

## 2018-08-06 DIAGNOSIS — Z23 Encounter for immunization: Secondary | ICD-10-CM

## 2018-08-06 DIAGNOSIS — R590 Localized enlarged lymph nodes: Secondary | ICD-10-CM

## 2018-08-06 MED ORDER — TRIAMCINOLONE ACETONIDE 0.5 % EX OINT: 1.0000 "application " | TOPICAL_OINTMENT | Freq: Two times a day (BID) | CUTANEOUS | 0 refills | Status: DC

## 2018-08-06 NOTE — Patient Instructions (Addendum)
Apply topical steroid ointment to the back of your earlobe twice a day for 1 week or until itching resolves. Do not use for more than 2 weeks. Avoid contact with face. You can apply Vaseline or Aquafor for moisture. Apply at least 15 minutes after the steroid.  Return if worsening ear pain, fever, redness or warmth overlying the lymph node, increasing size or pain, or lymph node persists for more than 2 weeks

## 2018-08-06 NOTE — Progress Notes (Signed)
HPI:                                                                Theresa Scott is a 62 y.o. female who presents to [facility] today for left ear pain  Reports new onset left-sided otalgia x 1 day associated with a palpable, tender "knot"  behind her left ear beginning last night. Pain is intermittent, mild-moderate. No hearing changes or otorrhea. No fever, chills, malaise.  She was recently treated for eustachian tube dysfunction and possible early right-sided otitis media on 07/18/18 and completed antibiotic therapy and medrol dosepak.  She is also under the care of Dr. Carlean Purl for NAFLD, who recommended she receive immunizations for hepatitis A/B, pneumococcal, and influenza.  Past Medical History:  Diagnosis Date  . Chronic kidney disease   . Complication of anesthesia    hard to wake up and swelling after kidney stone removal in 1980 per pt  . Degenerative disc disease   . Depression   . Diabetes mellitus   . Fatty liver disease, nonalcoholic   . Gallstones   . GERD (gastroesophageal reflux disease)   . Hypercholesterolemia   . Hypertension   . Kidney stones   . Obesity   . Osteoporosis   . Pancreatitis    Past Surgical History:  Procedure Laterality Date  . BACK SURGERY  1990,2003,2004,2010  . CARDIAC CATHETERIZATION  2011   [facility].   Marland Kitchen CARDIAC SURGERY  2002   ablation  . CHOLECYSTECTOMY  1976  . COLONOSCOPY  2008  . ESOPHAGOGASTRODUODENOSCOPY N/A 01/27/2013   Procedure: ESOPHAGOGASTRODUODENOSCOPY (EGD) upper endoscopy;  Surgeon: Shann Medal, MD;  Location: [facility];  Service: General;  Laterality: N/A;  . 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 HAND SURGERY  2000   right  . TONSILLECTOMY  1971   Social History   Tobacco Use  . Smoking status: Former Smoker    Packs/day: 0.50    Years: 18.00    Pack years: 9.00    Last attempt to quit: 08/04/1990    Years since quitting: 28.0  . Smokeless tobacco: Never  Used  Substance Use Topics  . Alcohol use: No   family history includes Alcohol abuse in her brother and mother; Cirrhosis in her sister; Colon cancer in her brother; Diabetes in her sister; Kidney failure in her brother; Liver cancer in her brother; Lung cancer in her mother.    ROS: negative except as noted in the HPI  Medications: Current Outpatient Medications  Medication Sig Dispense Refill  . ACCU-CHEK FASTCLIX LANCETS MISC For testing blood sugars 1-2 times daily. DX: R73.01 306 each 4  . acetaminophen (TYLENOL) 500 MG tablet Take 1 tablet (500 mg total) by mouth every 6 (six) hours as needed. 30 tablet 0  . Alcohol Swabs (B-D SINGLE USE SWABS REGULAR) PADS To be used when testing blood sugars 1-2 daily. DX: R73.01 300 each 4  . baclofen (LIORESAL) 10 MG tablet     . Blood Glucose Calibration (ACCU-CHEK SMARTVIEW CONTROL) LIQD To be used to calibrate glucose meter as needed. 1 each prn  . Blood Glucose Monitoring Suppl (ACCU-CHEK NANO SMARTVIEW) w/Device KIT Check fasting blood sugars every morning and once after largest meal of the  day a few x a week. DX:R73.01 1 kit 0  . cetirizine (ZYRTEC ALLERGY) 10 MG tablet Take 1 tablet (10 mg total) by mouth daily. 30 tablet 0  . Cyanocobalamin (VITAMIN B-12 PO) Take 1 tablet by mouth every Wednesday.    Marland Kitchen desvenlafaxine (PRISTIQ) 100 MG 24 hr tablet Take 1 tablet (100 mg total) by mouth daily. 90 tablet 3  . Docusate Sodium (COLACE PO)     . EPINEPHrine 0.3 mg/0.3 mL IJ SOAJ injection Inject 0.3 mLs (0.3 mg total) into the muscle once. 2 Device prn  . gabapentin (NEURONTIN) 300 MG capsule TAKE 2 CAPSULES (600 MG TOTAL) BY MOUTH DAILY. 180 capsule 1  . glucose blood test strip Accu-chek smartview test strip For testing blood sugars 2 times daily. DX:R73.01 300 each 4  . ibuprofen (ADVIL,MOTRIN) 600 MG tablet Take 1 tablet (600 mg total) by mouth every 6 (six) hours as needed. 30 tablet 0  . metFORMIN (GLUCOPHAGE-XR) 500 MG 24 hr tablet TAKE  1 TABLET (500 MG TOTAL) BY MOUTH DAILY WITH BREAKFAST. 90 tablet 3  . metoCLOPramide (REGLAN) 10 MG tablet Take 1 tablet (10 mg total) by mouth every 6 (six) hours. 30 tablet 0  . metoprolol succinate (TOPROL-XL) 25 MG 24 hr tablet TAKE 1 TABLET EVERY DAY 90 tablet 1  . omeprazole (PRILOSEC) 40 MG capsule Take 1 capsule (40 mg total) by mouth daily. 90 capsule 3  . ondansetron (ZOFRAN-ODT) 4 MG disintegrating tablet DISSOLVE 1 TABLET ON THE TONGUE EVERY 8 HOURS AS NEEDED FOR NAUSEA AND VOMITING 60 tablet 4  . triamcinolone ointment (KENALOG) 0.5 % Apply 1 application topically 2 (two) times daily. To affected area, avoid eyes and face 15 g 0   No current facility-administered medications for this visit.    Allergies  Allergen Reactions  . Bee Venom Anaphylaxis  . Loratadine Itching    REACTION: dizziness  . Nsaids Other (See Comments)    GI intolerance.    . Latex Rash       Objective:  BP 131/80   Pulse 65   Temp 98 F (36.7 C) (Oral)   Wt 187 lb (84.8 kg)   BMI 29.29 kg/m  Gen:  alert, not ill-appearing, no distress, appropriate for age HEENT: head normocephalic without obvious abnormality, conjunctiva and cornea clear, wearing glasses, external ear canals normal bilaterally, left TM semi-transparent and nonbulging, right TM appears dull, approx 5 mm left post-auricular lymph node, no mastoid tenderness, oropharynx clear, neck supple, no cervical adenopathy, tenderness of the left tonsillar area, trachea midline Pulm: Normal work of breathing, normal phonation MSK: extremities atraumatic, normal gait and station Skin: left posterior ear lobe there is a 3 mm ulceration with surrounding scale    No results found for this or any previous visit (from the past 72 hour(s)). No results found.    Assessment and Plan: 62 y.o. female with   .Diagnoses and all orders for this visit:  Postauricular adenopathy  Earlobe lesion, left -     triamcinolone ointment (KENALOG) 0.5 %;  Apply 1 application topically 2 (two) times daily. To affected area, avoid eyes and face  Need for immunization against influenza -     Flu Vaccine QUAD 36+ mos IM  Need for hepatitis A and B vaccination -     Hepatitis A hepatitis B combined vaccine IM  Need for 23-polyvalent pneumococcal polysaccharide vaccine -     Pneumococcal polysaccharide vaccine 23-valent greater than or equal to 2yo subcutaneous/IM  Bilateral tympanogram shows low peak height. No evidence of acute OM on exam. No evidence of mastoiditis. Likely a reactive lymph node. Appears to have dermatitis of the left posterior ear lobe. Treating with topical corticosteroid Return precautions discussed including worsening ear pain, fever, redness or warmth overlying the lymph node, increasing size or pain, or lymph node persists for more than 2 weeks  Immunizations updated per orders    Patient education and anticipatory guidance given Patient agrees with treatment plan Follow-up as needed if symptoms worsen or fail to improve  Darlyne Russian PA-C

## 2018-08-15 ENCOUNTER — Encounter: Payer: Self-pay | Admitting: Family Medicine

## 2018-08-26 DIAGNOSIS — H2512 Age-related nuclear cataract, left eye: Secondary | ICD-10-CM | POA: Diagnosis not present

## 2018-08-26 DIAGNOSIS — H25013 Cortical age-related cataract, bilateral: Secondary | ICD-10-CM | POA: Diagnosis not present

## 2018-08-26 DIAGNOSIS — H2513 Age-related nuclear cataract, bilateral: Secondary | ICD-10-CM | POA: Diagnosis not present

## 2018-08-26 DIAGNOSIS — E119 Type 2 diabetes mellitus without complications: Secondary | ICD-10-CM | POA: Diagnosis not present

## 2018-08-26 DIAGNOSIS — H35033 Hypertensive retinopathy, bilateral: Secondary | ICD-10-CM | POA: Diagnosis not present

## 2018-09-02 ENCOUNTER — Other Ambulatory Visit: Payer: Self-pay | Admitting: Family Medicine

## 2018-09-02 DIAGNOSIS — R7301 Impaired fasting glucose: Secondary | ICD-10-CM

## 2018-09-03 DIAGNOSIS — H2512 Age-related nuclear cataract, left eye: Secondary | ICD-10-CM | POA: Diagnosis not present

## 2018-09-03 DIAGNOSIS — H2511 Age-related nuclear cataract, right eye: Secondary | ICD-10-CM | POA: Diagnosis not present

## 2018-09-03 DIAGNOSIS — H25812 Combined forms of age-related cataract, left eye: Secondary | ICD-10-CM | POA: Diagnosis not present

## 2018-09-13 ENCOUNTER — Other Ambulatory Visit: Payer: Self-pay | Admitting: Family Medicine

## 2018-09-18 DIAGNOSIS — H25011 Cortical age-related cataract, right eye: Secondary | ICD-10-CM | POA: Diagnosis not present

## 2018-09-18 DIAGNOSIS — H2511 Age-related nuclear cataract, right eye: Secondary | ICD-10-CM | POA: Diagnosis not present

## 2018-09-24 DIAGNOSIS — H2511 Age-related nuclear cataract, right eye: Secondary | ICD-10-CM | POA: Diagnosis not present

## 2018-09-24 DIAGNOSIS — H25811 Combined forms of age-related cataract, right eye: Secondary | ICD-10-CM | POA: Diagnosis not present

## 2018-10-01 DIAGNOSIS — G894 Chronic pain syndrome: Secondary | ICD-10-CM | POA: Diagnosis not present

## 2018-10-01 DIAGNOSIS — M545 Low back pain: Secondary | ICD-10-CM | POA: Diagnosis not present

## 2018-10-01 DIAGNOSIS — M5116 Intervertebral disc disorders with radiculopathy, lumbar region: Secondary | ICD-10-CM | POA: Diagnosis not present

## 2018-10-01 DIAGNOSIS — Z79899 Other long term (current) drug therapy: Secondary | ICD-10-CM | POA: Diagnosis not present

## 2018-10-01 DIAGNOSIS — Z79891 Long term (current) use of opiate analgesic: Secondary | ICD-10-CM | POA: Diagnosis not present

## 2018-10-14 DIAGNOSIS — Z961 Presence of intraocular lens: Secondary | ICD-10-CM | POA: Diagnosis not present

## 2018-10-18 ENCOUNTER — Telehealth: Payer: Self-pay | Admitting: *Deleted

## 2018-10-18 NOTE — Telephone Encounter (Signed)
Form completed,faxed,confirmation received and scanned into patient's chart..Abigaile Rossie Lynetta, CMA  

## 2018-10-19 ENCOUNTER — Emergency Department (HOSPITAL_COMMUNITY)
Admission: EM | Admit: 2018-10-19 | Discharge: 2018-10-20 | Disposition: A | Discharge status: Home / Self Care | Payer: Medicare HMO | Attending: Emergency Medicine | Admitting: Emergency Medicine

## 2018-10-19 DIAGNOSIS — I83899 Varicose veins of unspecified lower extremities with other complications: Secondary | ICD-10-CM | POA: Diagnosis not present

## 2018-10-19 DIAGNOSIS — Z7984 Long term (current) use of oral hypoglycemic drugs: Secondary | ICD-10-CM | POA: Diagnosis not present

## 2018-10-19 DIAGNOSIS — Z87891 Personal history of nicotine dependence: Secondary | ICD-10-CM | POA: Insufficient documentation

## 2018-10-19 DIAGNOSIS — I129 Hypertensive chronic kidney disease with stage 1 through stage 4 chronic kidney disease, or unspecified chronic kidney disease: Secondary | ICD-10-CM | POA: Diagnosis not present

## 2018-10-19 DIAGNOSIS — E1122 Type 2 diabetes mellitus with diabetic chronic kidney disease: Secondary | ICD-10-CM | POA: Diagnosis not present

## 2018-10-19 DIAGNOSIS — Z79899 Other long term (current) drug therapy: Secondary | ICD-10-CM | POA: Diagnosis not present

## 2018-10-19 DIAGNOSIS — N189 Chronic kidney disease, unspecified: Secondary | ICD-10-CM | POA: Diagnosis not present

## 2018-10-19 DIAGNOSIS — D698 Other specified hemorrhagic conditions: Secondary | ICD-10-CM | POA: Diagnosis not present

## 2018-10-19 NOTE — ED Triage Notes (Signed)
Pt states she pickled at a piece of skin and blood started to pour out of her leg. Leg is wrapped.

## 2018-10-20 NOTE — Discharge Instructions (Signed)
Bleeding was likely from a varicose vein, this can happen very commonly.  If bleeding reoccurs place dressing once again, leave in place for at least an hour and then check to see if bleeding has stopped if he continues to bleed, return for reevaluation.

## 2018-10-20 NOTE — ED Notes (Signed)
Pt moved to bed, with dressing in place from pt's husband.  Distal pulses present with cappillary refil < 2 sec.  Bandage left in place until provider assessment.

## 2018-10-20 NOTE — ED Provider Notes (Signed)
[place] EMERGENCY DEPARTMENT Provider Note   CSN: [id number] Arrival date & time: 10/19/18  2337     History   Chief Complaint Chief Complaint  Patient presents with  . Leg Injury    HPI Theresa Scott is a 62 y.o. female.  Theresa Scott is a 62 y.o. female with a history of hypertension, hyperlipidemia, diabetes, GERD, CKD, who presents for evaluation of bleeding from the left leg.  Patient reports that she picked at a small piece of skin on the anterior portion of her shin and then blood started to pour out of her leg.  Patient reports that her husband put a dressing over the area which seemed to control the bleeding, but she presented for further evaluation.  Patient reports history of varicose veins in this area but is never had an instance of similar bleeding.  Is not on any blood thinners.  Denies any pain in the leg, no swelling, redness or warmth.  No fevers or chills.  Tetanus is up-to-date.     Past Medical History:  Diagnosis Date  . Chronic kidney disease   . Complication of anesthesia    hard to wake up and swelling after kidney stone removal in 1980 per pt  . Degenerative disc disease   . Depression   . Diabetes mellitus   . Fatty liver disease, nonalcoholic   . Gallstones   . GERD (gastroesophageal reflux disease)   . Hypercholesterolemia   . Hypertension   . Kidney stones   . Obesity   . Osteoporosis   . Pancreatitis     Patient Active Problem List   Diagnosis Date Noted  . Postauricular adenopathy 08/06/2018  . Earlobe lesion, left 08/06/2018  . NAFLD (nonalcoholic fatty liver disease) 12/04/2016  . Liver fibrosis ([place]) - suspected 12/04/2016  . Aortic calcification ([place]) 04/20/2016  . 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 Daytime somnolence 06/26/2013  . Sleep disorder, circadian 06/26/2013  . History of gastric bypass, 08/22/2010. 11/03/2011  . IMPAIRED FASTING GLUCOSE 12/15/2010  . ANXIETY 01/31/2010    . PSVT 12/01/2009  . STASIS DERMATITIS 04/06/2009  . ALLERGIC RHINITIS 08/19/2008  . PERIPHERAL EDEMA 08/12/2008  . DIABETIC PERIPHERAL NEUROPATHY 07/20/2008  . Hyperlipemia 06/18/2008  . OBESITY 06/18/2008  . Major depression, chronic 06/18/2008  . Peripheral vascular disease, unspecified ([place]) 06/18/2008  . GERD 06/18/2008  . OSTEOARTHRITIS 06/18/2008  . INSOMNIA 06/18/2008  . INCONTINENCE 06/18/2008  . PROTEINURIA 06/18/2008  . NEPHROLITHIASIS, HX OF 06/18/2008    Past Surgical History:  Procedure Laterality Date  . BACK SURGERY  1990,2003,2004,2010  . CARDIAC CATHETERIZATION  2011   [facility].   Marland Kitchen CARDIAC SURGERY  2002   ablation  . CHOLECYSTECTOMY  1976  . COLONOSCOPY  2008  . ESOPHAGOGASTRODUODENOSCOPY N/A 01/27/2013   Procedure: ESOPHAGOGASTRODUODENOSCOPY (EGD) upper endoscopy;  Surgeon: Shann Medal, MD;  Location: [facility];  Service: General;  Laterality: N/A;  . 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 HAND SURGERY  2000   right  . TONSILLECTOMY  1971     OB History   No obstetric history on file.      Home Medications    Prior to Admission medications   Medication Sig Start Date End Date Taking? Authorizing Provider  ACCU-CHEK FASTCLIX LANCETS MISC For testing blood sugars 1-2 times daily. DX: R73.01 04/11/18   Hali Marry, MD  ACCU-CHEK SMARTVIEW test strip TEST BLOOD SUGAR TWICE DAILY 09/02/18   Beatrice Lecher  D, MD  acetaminophen (TYLENOL) 500 MG tablet Take 1 tablet (500 mg total) by mouth every 6 (six) hours as needed. 01/08/18   Law, Bea Graff, PA-C  Alcohol Swabs (B-D SINGLE USE SWABS REGULAR) PADS USE  ONCE  TO TWICE DAILY 09/16/18   Hali Marry, MD  baclofen (LIORESAL) 10 MG tablet  10/05/15   [provider]  Blood Glucose Calibration (ACCU-CHEK SMARTVIEW CONTROL) LIQD To be used to calibrate glucose meter as needed. 07/05/17   Hali Marry, MD  Blood Glucose Monitoring Suppl (ACCU-CHEK NANO SMARTVIEW)  w/Device KIT Check fasting blood sugars every morning and once after largest meal of the day a few x a week. DX:R73.01 07/10/17   Hali Marry, MD  cetirizine (ZYRTEC ALLERGY) 10 MG tablet Take 1 tablet (10 mg total) by mouth daily. 01/08/18   Law, Bea Graff, PA-C  Cyanocobalamin (VITAMIN B-12 PO) Take 1 tablet by mouth every Wednesday.    [provider]  desvenlafaxine (PRISTIQ) 100 MG 24 hr tablet Take 1 tablet (100 mg total) by mouth daily. 12/26/16   Hali Marry, MD  Docusate Sodium (COLACE PO)  06/21/16   [provider]  EPINEPHrine 0.3 mg/0.3 mL IJ SOAJ injection Inject 0.3 mLs (0.3 mg total) into the muscle once. 12/17/15   Hali Marry, MD  gabapentin (NEURONTIN) 300 MG capsule TAKE 2 CAPSULES (600 MG TOTAL) BY MOUTH DAILY. 06/14/18   Hali Marry, MD  ibuprofen (ADVIL,MOTRIN) 600 MG tablet Take 1 tablet (600 mg total) by mouth every 6 (six) hours as needed. 01/08/18   Law, Bea Graff, PA-C  metFORMIN (GLUCOPHAGE-XR) 500 MG 24 hr tablet TAKE 1 TABLET (500 MG TOTAL) BY MOUTH DAILY WITH BREAKFAST. 06/25/18   Hali Marry, MD  metoCLOPramide (REGLAN) 10 MG tablet Take 1 tablet (10 mg total) by mouth every 6 (six) hours. 01/08/18   Law, Bea Graff, PA-C  metoprolol succinate (TOPROL-XL) 25 MG 24 hr tablet TAKE 1 TABLET EVERY DAY 11/22/17   Hali Marry, MD  omeprazole (PRILOSEC) 40 MG capsule Take 1 capsule (40 mg total) by mouth daily. 07/05/17   Hali Marry, MD  ondansetron (ZOFRAN-ODT) 4 MG disintegrating tablet DISSOLVE 1 TABLET ON THE TONGUE EVERY 8 HOURS AS NEEDED FOR NAUSEA AND VOMITING 04/18/18   Hali Marry, MD  triamcinolone ointment (KENALOG) 0.5 % Apply 1 application topically 2 (two) times daily. To affected area, avoid eyes and face 08/06/18   Trixie Dredge, PA-C    Family History Family History  Problem Relation Age of Onset  . Lung cancer Mother   . Alcohol abuse Mother   .  Colon cancer Brother        late 61's  . Liver cancer Brother   . Alcohol abuse Brother   . Cirrhosis Sister        secondary medication  . Diabetes Sister   . Kidney failure Brother        Dialysis at 101, born single kidney  . Esophageal cancer Neg Hx   . Stomach cancer Neg Hx   . Rectal cancer Neg Hx     Social History Social History   Tobacco Use  . Smoking status: Former Smoker    Packs/day: 0.50    Years: 18.00    Pack years: 9.00    Last attempt to quit: 08/04/1990    Years since quitting: 28.2  . Smokeless tobacco: Never Used  Substance Use Topics  . Alcohol use:  No  . Drug use: No     Allergies   Bee venom; Loratadine; Nsaids; and Latex   Review of Systems Review of Systems  Constitutional: Negative for chills and fever.  Musculoskeletal: Negative for arthralgias and myalgias.  Skin: Positive for wound.  Neurological: Negative for weakness and numbness.     Physical Exam Updated Vital Signs BP (!) 167/76   Pulse (!) 58   Temp 97.7 F (36.5 C) (Oral)   Resp 16   SpO2 98%   Physical Exam Vitals signs and nursing note reviewed.  Constitutional:      General: She is not in acute distress.    Appearance: She is well-developed. She is not diaphoretic.  HENT:     Head: Normocephalic and atraumatic.  Eyes:     General:        Right eye: No discharge.        Left eye: No discharge.  Pulmonary:     Effort: Pulmonary effort is normal. No respiratory distress.  Musculoskeletal:     Comments: Dressing in place over the distal portion of the left lower leg, 2+ distal pulses and good capillary refill, lower extremity is warm and well-perfused.  Dressing removed and there is a pinpoint area of erythema where patient reports bleeding was previously coming from, no active bleeding with removal of the dressing there are numerous surrounding varicose veins.  The area is nontender to palpation, no surrounding erythema  Skin:    General: Skin is warm and dry.      Capillary Refill: Capillary refill takes less than 2 seconds.  Neurological:     Mental Status: She is alert and oriented to person, place, and time. Mental status is at baseline.     Coordination: Coordination normal.  Psychiatric:        Behavior: Behavior normal.      ED Treatments / Results  Labs (all labs ordered are listed, but only abnormal results are displayed) Labs Reviewed - No data to display  EKG None  Radiology No results found.  Procedures Procedures (including critical care time)  Medications Ordered in ED Medications - No data to display   Initial Impression / Assessment and Plan / ED Course  I have reviewed the triage vital signs and the nursing notes.  Pertinent labs & imaging results that were available during my care of the patient were reviewed by me and considered in my medical decision making (see chart for details).  Patient presents for evaluation of bleeding from an area she scratched on her left shin directly over a varicose vein, no history of prior bleeding issues, not on blood thinners.  Patient's husband placed a pressure dressing over the area, distal pulses intact to be on the dressing, the dressing was taken down and there appeared to be no active bleeding.  Small pinpoint area of erythema noted, but no larger wound.  Patient was monitored while sitting up on the edge of the bed with no further bleeding the patient ambulated up and down the hallways without any bleeding from the wound.  It appears that pressure dressing that was placed by spouse has achieved good hemostasis.  Will place small bandage over the area to help avoid patient scratching the area again.  Return precautions discussed.  Patient is stable for discharge home in good condition at this time.  Final Clinical Impressions(s) / ED Diagnoses   Final diagnoses:  Bleeding from varicose vein    ED Discharge Orders  None       Janet Berlin 10/20/18 2138      Davonna Belling, MD 10/24/18 [phone]

## 2018-11-04 ENCOUNTER — Encounter: Payer: Self-pay | Admitting: Emergency Medicine

## 2018-11-04 ENCOUNTER — Emergency Department
Admission: EM | Admit: 2018-11-04 | Discharge: 2018-11-04 | Disposition: A | Discharge status: Home / Self Care | Payer: Medicare HMO | Source: Home / Self Care | Attending: Family Medicine | Admitting: Family Medicine

## 2018-11-04 DIAGNOSIS — J069 Acute upper respiratory infection, unspecified: Secondary | ICD-10-CM

## 2018-11-04 DIAGNOSIS — H8110 Benign paroxysmal vertigo, unspecified ear: Secondary | ICD-10-CM

## 2018-11-04 MED ORDER — MECLIZINE HCL 25 MG PO TABS: ORAL_TABLET | ORAL | 0 refills | Status: DC

## 2018-11-04 MED ORDER — AMOXICILLIN 875 MG PO TABS: 875.0000 mg | ORAL_TABLET | Freq: Two times a day (BID) | ORAL | 0 refills | Status: DC

## 2018-11-04 NOTE — ED Provider Notes (Signed)
[name]    CSN: [id number] Arrival date & time: 11/04/18  1331     History   Chief Complaint Chief Complaint  Patient presents with  . Headache    HPI AYLYN WENZLER is a 63 y.o. female.   Four days ago patient began developing a frontal headache, sneezing, sinus congestion, fatigue, and neck soreness.  Her left ear feels full without pain.  No fevers, chills, and sweats, sore throat, or cough.  Her son has a URI with nasal congestion, cough, etc.  The history is provided by the patient.    Past Medical History:  Diagnosis Date  . Chronic kidney disease   . Complication of anesthesia    hard to wake up and swelling after kidney stone removal in 1980 per pt  . Degenerative disc disease   . Depression   . Diabetes mellitus   . Fatty liver disease, nonalcoholic   . Gallstones   . GERD (gastroesophageal reflux disease)   . Hypercholesterolemia   . Hypertension   . Kidney stones   . Obesity   . Osteoporosis   . Pancreatitis     Patient Active Problem List   Diagnosis Date Noted  . Postauricular adenopathy 08/06/2018  . [place] lesion, left 08/06/2018  . NAFLD (nonalcoholic fatty liver disease) 12/04/2016  . 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 Aortic calcification ([place]) 04/20/2016  . AVNRT (AV nodal re-entry tachycardia) ([place]) 08/18/2013  . Daytime somnolence 06/26/2013  . Sleep disorder, circadian 06/26/2013  . History of gastric bypass, 08/22/2010. 11/03/2011  . IMPAIRED FASTING GLUCOSE 12/15/2010  . ANXIETY 01/31/2010  . PSVT 12/01/2009  . STASIS DERMATITIS 04/06/2009  . ALLERGIC RHINITIS 08/19/2008  . PERIPHERAL EDEMA 08/12/2008  . DIABETIC PERIPHERAL NEUROPATHY 07/20/2008  . Hyperlipemia 06/18/2008  . OBESITY 06/18/2008  . Major depression, chronic 06/18/2008  . Peripheral vascular disease, unspecified ([place]) 06/18/2008  . GERD 06/18/2008  . OSTEOARTHRITIS 06/18/2008  . INSOMNIA 06/18/2008  . INCONTINENCE 06/18/2008  .  PROTEINURIA 06/18/2008  . NEPHROLITHIASIS, HX OF 06/18/2008    Past Surgical History:  Procedure Laterality Date  . BACK SURGERY  1990,2003,2004,2010  . CARDIAC CATHETERIZATION  2011   [facility].   Marland Kitchen CARDIAC SURGERY  2002   ablation  . CHOLECYSTECTOMY  1976  . COLONOSCOPY  2008  . ESOPHAGOGASTRODUODENOSCOPY N/A 01/27/2013   Procedure: ESOPHAGOGASTRODUODENOSCOPY (EGD) upper endoscopy;  Surgeon: Shann Medal, MD;  Location: [facility];  Service: General;  Laterality: N/A;  . 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 HAND SURGERY  2000   right  . TONSILLECTOMY  1971    OB History   No obstetric history on file.      Home Medications    Prior to Admission medications   Medication Sig Start Date End Date Taking? Authorizing Provider  ACCU-CHEK FASTCLIX LANCETS MISC For testing blood sugars 1-2 times daily. DX: R73.01 04/11/18   Hali Marry, MD  ACCU-CHEK SMARTVIEW test strip TEST BLOOD SUGAR TWICE DAILY 09/02/18   Hali Marry, MD  acetaminophen (TYLENOL) 500 MG tablet Take 1 tablet (500 mg total) by mouth every 6 (six) hours as needed. 01/08/18   Law, Bea Graff, PA-C  Alcohol Swabs (B-D SINGLE USE SWABS REGULAR) PADS USE  ONCE  TO TWICE DAILY 09/16/18   Hali Marry, MD  amoxicillin (AMOXIL) 875 MG tablet Take 1 tablet (875 mg total) by mouth 2 (two) times daily. (Rx void after 11/12/18 11/04/18   Kandra Nicolas, MD  baclofen (LIORESAL) 10 MG tablet  10/05/15   [provider]  Blood Glucose Calibration (ACCU-CHEK SMARTVIEW CONTROL) LIQD To be used to calibrate glucose meter as needed. 07/05/17   Hali Marry, MD  Blood Glucose Monitoring Suppl (ACCU-CHEK NANO SMARTVIEW) w/Device KIT Check fasting blood sugars every morning and once after largest meal of the day a few x a week. DX:R73.01 07/10/17   Hali Marry, MD  cetirizine (ZYRTEC ALLERGY) 10 MG tablet Take 1 tablet (10 mg total) by mouth daily. 01/08/18   Law, Bea Graff, PA-C    Cyanocobalamin (VITAMIN B-12 PO) Take 1 tablet by mouth every Wednesday.    [provider]  desvenlafaxine (PRISTIQ) 100 MG 24 hr tablet Take 1 tablet (100 mg total) by mouth daily. 12/26/16   Hali Marry, MD  Docusate Sodium (COLACE PO)  06/21/16   [provider]  EPINEPHrine 0.3 mg/0.3 mL IJ SOAJ injection Inject 0.3 mLs (0.3 mg total) into the muscle once. 12/17/15   Hali Marry, MD  gabapentin (NEURONTIN) 300 MG capsule TAKE 2 CAPSULES (600 MG TOTAL) BY MOUTH DAILY. 06/14/18   Hali Marry, MD  ibuprofen (ADVIL,MOTRIN) 600 MG tablet Take 1 tablet (600 mg total) by mouth every 6 (six) hours as needed. 01/08/18   Law, Bea Graff, PA-C  meclizine (ANTIVERT) 25 MG tablet Take one tab PO BID to TID prn dizziness 11/04/18   Kandra Nicolas, MD  metFORMIN (GLUCOPHAGE-XR) 500 MG 24 hr tablet TAKE 1 TABLET (500 MG TOTAL) BY MOUTH DAILY WITH BREAKFAST. 06/25/18   Hali Marry, MD  metoCLOPramide (REGLAN) 10 MG tablet Take 1 tablet (10 mg total) by mouth every 6 (six) hours. 01/08/18   Law, Bea Graff, PA-C  metoprolol succinate (TOPROL-XL) 25 MG 24 hr tablet TAKE 1 TABLET EVERY DAY 11/22/17   Hali Marry, MD  omeprazole (PRILOSEC) 40 MG capsule Take 1 capsule (40 mg total) by mouth daily. 07/05/17   Hali Marry, MD  ondansetron (ZOFRAN-ODT) 4 MG disintegrating tablet DISSOLVE 1 TABLET ON THE TONGUE EVERY 8 HOURS AS NEEDED FOR NAUSEA AND VOMITING 04/18/18   Hali Marry, MD  triamcinolone ointment (KENALOG) 0.5 % Apply 1 application topically 2 (two) times daily. To affected area, avoid eyes and face 08/06/18   Trixie Dredge, PA-C    Family History Family History  Problem Relation Age of Onset  . Lung cancer Mother   . Alcohol abuse Mother   . Colon cancer Brother        late 31's  . Liver cancer Brother   . Alcohol abuse Brother   . Cirrhosis Sister        secondary medication  . Diabetes Sister   .  Kidney failure Brother        Dialysis at 4, born single kidney  . Esophageal cancer Neg Hx   . Stomach cancer Neg Hx   . Rectal cancer Neg Hx     Social History Social History   Tobacco Use  . Smoking status: Former Smoker    Packs/day: 0.50    Years: 18.00    Pack years: 9.00    Last attempt to quit: 08/04/1990    Years since quitting: 28.2  . Smokeless tobacco: Never Used  Substance Use Topics  . Alcohol use: No  . Drug use: No     Allergies   Bee venom; Loratadine; Nsaids; and Latex   Review of Systems Review of Systems No sore throat  No cough + sneezing No pleuritic pain No wheezing + nasal congestion + post-nasal drainage No sinus pain/pressure No itchy/red eyes ? Earache + dizziness No hemoptysis No SOB No fever/chills + nausea No vomiting No abdominal pain No diarrhea No urinary symptoms No skin rash + fatigue No myalgias + headache Used OTC meds without relief   Physical Exam Triage Vital Signs ED Triage Vitals  Enc Vitals Group     BP 11/04/18 1401 (!) 161/82     Pulse Rate 11/04/18 1401 61     Resp --      Temp 11/04/18 1401 97.7 F (36.5 C)     Temp Source 11/04/18 1401 Oral     SpO2 11/04/18 1401 96 %     Weight 11/04/18 1403 190 lb (86.2 kg)     Height 11/04/18 1403 _0  (1.702 m)     Head Circumference --      Peak Flow --      Pain Score 11/04/18 1401 8     Pain Loc --      Pain Edu? --      Excl. in [place]? --    No data found.  Updated Vital Signs BP (!) 161/82 (BP Location: Right Arm)   Pulse 61   Temp 97.7 F (36.5 C) (Oral)   Ht _1  (1.702 m)   Wt 86.2 kg   SpO2 96%   BMI 29.76 kg/m   Visual Acuity Right Eye Distance:   Left Eye Distance:   Bilateral Distance:    Right Eye Near:   Left Eye Near:    Bilateral Near:     Physical Exam Nursing notes and Vital Signs reviewed. Appearance:  Patient appears stated age, and in no acute distress Eyes:  Pupils are equal, round, and reactive to light and  accomodation.  Extraocular movement is intact.  Conjunctivae are not inflamed.  No nystagmus present.  Ears:  Canals normal.  Tympanic membranes normal.  Nose:  Mildly congested turbinates.  No sinus tenderness.    Pharynx:  Normal Neck:  Supple.  Enlarged posterior/lateral nodes are palpated bilaterally, tender to palpation on the left.   Lungs:  Clear to auscultation.  Breath sounds are equal.  Moving air well. Heart:  Regular rate and rhythm without murmurs, rubs, or gallops.  Abdomen:  Nontender without masses or hepatosplenomegaly.  Bowel sounds are present.  No CVA or flank tenderness.  Extremities:  No edema.  Skin:  No rash present.    UC Treatments / Results  Labs (all labs ordered are listed, but only abnormal results are displayed) Labs Reviewed - No data to display  EKG None  Radiology No results found.  Procedures Procedures (including critical care time)  Medications Ordered in UC Medications - No data to display  Initial Impression / Assessment and Plan / UC Course  I have reviewed the triage vital signs and the nursing notes.  Pertinent labs & imaging results that were available during my care of the patient were reviewed by me and considered in my medical decision making (see chart for details).    There is no evidence of bacterial infection today.  Treat symptomatically for now. Rx written for Antivert. Followup with Family Doctor if not improved in about 10 days.   Final Clinical Impressions(s) / UC Diagnoses   Final diagnoses:  Viral URI  Benign paroxysmal positional vertigo, unspecified laterality     Discharge Instructions     Take plain guaifenesin (1253m extended release  tabs such as Mucinex) twice daily, with plenty of water, for cough and congestion.  Get adequate rest.   May use Afrin nasal spray (or generic oxymetazoline) each morning for about 5 days and then discontinue.  Also recommend using saline nasal spray several times daily and  saline nasal irrigation (AYR is a common brand).    Try warm salt water gargles for sore throat.  Stop all antihistamines for now, and other non-prescription cough/cold preparations. May take Tylenol as needed for headache, fever, etc. May take Delsym Cough Suppressant at bedtime for nighttime cough.  Begin Amoxicillin if not improving about one week or if persistent fever develops (Given a prescription to hold, with an expiration date)        ED Prescriptions    Medication Sig Dispense Auth. Provider   meclizine (ANTIVERT) 25 MG tablet Take one tab PO BID to TID prn dizziness 12 tablet Kandra Nicolas, MD   amoxicillin (AMOXIL) 875 MG tablet Take 1 tablet (875 mg total) by mouth 2 (two) times daily. (Rx void after 11/12/18 14 tablet Kandra Nicolas, MD         Kandra Nicolas, MD 11/04/18 [phone]

## 2018-11-04 NOTE — Discharge Instructions (Addendum)
Take plain guaifenesin (1200mg  extended release tabs such as Mucinex) twice daily, with plenty of water, for cough and congestion.  Get adequate rest.   May use Afrin nasal spray (or generic oxymetazoline) each morning for about 5 days and then discontinue.  Also recommend using saline nasal spray several times daily and saline nasal irrigation (AYR is a common brand).    Try warm salt water gargles for sore throat.  Stop all antihistamines for now, and other non-prescription cough/cold preparations. May take Tylenol as needed for headache, fever, etc. May take Delsym Cough Suppressant at bedtime for nighttime cough.  Begin Amoxicillin if not improving about one week or if persistent fever develops

## 2018-11-04 NOTE — ED Triage Notes (Signed)
Headache, dizziness, nausea x 4 days

## 2018-11-14 ENCOUNTER — Ambulatory Visit: Payer: Medicare HMO | Admitting: Family Medicine

## 2018-11-27 ENCOUNTER — Encounter: Payer: Self-pay | Admitting: Family Medicine

## 2018-11-27 ENCOUNTER — Ambulatory Visit (INDEPENDENT_AMBULATORY_CARE_PROVIDER_SITE_OTHER): Payer: Medicare HMO | Admitting: Family Medicine

## 2018-11-27 VITALS — BP 131/67 | HR 71 | Ht 67.0 in | Wt 194.0 lb

## 2018-11-27 DIAGNOSIS — R7301 Impaired fasting glucose: Secondary | ICD-10-CM

## 2018-11-27 DIAGNOSIS — R32 Unspecified urinary incontinence: Secondary | ICD-10-CM

## 2018-11-27 DIAGNOSIS — K760 Fatty (change of) liver, not elsewhere classified: Secondary | ICD-10-CM | POA: Diagnosis not present

## 2018-11-27 DIAGNOSIS — M67479 Ganglion, unspecified ankle and foot: Secondary | ICD-10-CM

## 2018-11-27 DIAGNOSIS — K7400 Hepatic fibrosis, unspecified: Secondary | ICD-10-CM

## 2018-11-27 DIAGNOSIS — I700 Atherosclerosis of aorta: Secondary | ICD-10-CM | POA: Diagnosis not present

## 2018-11-27 DIAGNOSIS — Z23 Encounter for immunization: Secondary | ICD-10-CM | POA: Diagnosis not present

## 2018-11-27 LAB — POCT URINALYSIS DIPSTICK
Bilirubin, UA: NEGATIVE
Blood, UA: NEGATIVE
Glucose, UA: NEGATIVE
Ketones, UA: NEGATIVE
Leukocytes, UA: NEGATIVE
Nitrite, UA: NEGATIVE
Protein, UA: NEGATIVE
Spec Grav, UA: 1.025 (ref 1.010–1.025)
Urobilinogen, UA: 0.2 E.U./dL
pH, UA: 5.5 (ref 5.0–8.0)

## 2018-11-27 LAB — POCT GLYCOSYLATED HEMOGLOBIN (HGB A1C): Hemoglobin A1C: 6.1 % — AB (ref 4.0–5.6)

## 2018-11-27 LAB — POCT UA - MICROALBUMIN
Albumin/Creatinine Ratio, Urine, POC: <30
Creatinine, POC: 200 mg/dL
Microalbumin Ur, POC: 30 mg/L

## 2018-11-27 NOTE — Progress Notes (Signed)
Subjective:    Patient ID: Theresa Scott, female    DOB: Feb 01, 1956, 63 y.o.   MRN: [id number]  HPI Impaired fasting glucose-no increased thirst or urination. No symptoms consistent with hypoglycemia.  Follow-up nonalcoholic fatty liver disease-questionable cirrhosis.  Last evaluation with Dr. Carlean Purl in 2017.  Had persistently elevated liver enzymes over quite some time but on last check in July that had actually dropped back down into the normal range which is fantastic.  Did refer her back to Dr. Arelia Longest and she was seen in October.  He congratulated her on some of her weight loss an d recommended that she get the hepatitis A B vaccines.  He Wants her to follow-up in 1 year.  Plan to recheck LFTs every 6 months.  And avoid alcohol and continue to work on weight loss.  Aortic calcification noted on CT from 2017.  Chest pain or shortness of breath.  She does take a daily statin.  Also reports episodes of urinary incontinence.  She says she will just stand up and start leaking.  Even when she feels like she does not have to urinate.  She denies any dysuria hematuria fevers chills or sweats or back pain with it.  It is been going on for about 3 months.  She says it seems to happen more at night when she lays down and then gets up.  She does have a urologist that she follows with Dr. Stann Mainland.  Also on her foot exam today she is having some recurrence of a cyst at the distal first metatarsal on the right foot.  She had it drained previously.  She says right now it is not really bothering her.  Review of Systems  BP 131/67   Pulse 71   Ht '5\' 7"'  (1.702 m)   Wt 194 lb (88 kg)   SpO2 97%   BMI 30.38 kg/m     Allergies  Allergen Reactions  . Bee Venom Anaphylaxis  . Loratadine Itching    REACTION: dizziness  . Nsaids Other (See Comments)    GI intolerance.    . Latex Rash    Past Medical History:  Diagnosis Date  . Chronic kidney disease   . Complication of anesthesia    hard to wake up  and swelling after kidney stone removal in 1980 per pt  . Degenerative disc disease   . Depression   . Diabetes mellitus   . Fatty liver disease, nonalcoholic   . Gallstones   . GERD (gastroesophageal reflux disease)   . Hypercholesterolemia   . Hypertension   . Kidney stones   . Obesity   . Osteoporosis   . Pancreatitis     Past Surgical History:  Procedure Laterality Date  . BACK SURGERY  1990,2003,2004,2010  . CARDIAC CATHETERIZATION  2011   [facility].   Marland Kitchen CARDIAC SURGERY  2002   ablation  . CHOLECYSTECTOMY  1976  . COLONOSCOPY  2008  . ESOPHAGOGASTRODUODENOSCOPY N/A 01/27/2013   Procedure: ESOPHAGOGASTRODUODENOSCOPY (EGD) upper endoscopy;  Surgeon: Shann Medal, MD;  Location: [facility];  Service: General;  Laterality: N/A;  . 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 HAND SURGERY  2000   right  . TONSILLECTOMY  1971    Social History   Socioeconomic History  . Marital status: Married    Spouse name: Not on file  . Number of children: Not on file  . Years of education: Not on file  . Highest education level: Not on file  Occupational History  . Not on file  Social Needs  . Financial resource strain: Not on file  . Food insecurity:    Worry: Not on file    Inability: Not on file  . Transportation needs:    Medical: Not on file    Non-medical: Not on file  Tobacco Use  . Smoking status: Former Smoker    Packs/day: 0.50    Years: 18.00    Pack years: 9.00    Last attempt to quit: 08/04/1990    Years since quitting: 28.3  . Smokeless tobacco: Never Used  Substance and Sexual Activity  . Alcohol use: No  . Drug use: No  . Sexual activity: Not on file  Lifestyle  . Physical activity:    Days per week: Not on file    Minutes per session: Not on file  . Stress: Not on file  Relationships  . Social connections:    Talks on phone: Not on file    Gets together: Not on file    Attends religious service: Not on file    Active member of club or organization: Not  on file    Attends meetings of clubs or organizations: Not on file    Relationship status: Not on file  . Intimate partner violence:    Fear of current or ex partner: Not on file    Emotionally abused: Not on file    Physically abused: Not on file    Forced sexual activity: Not on file  Other Topics Concern  . Not on file  Social History Narrative   Married 1 son one daughter   Disabled, chronic back pain, former [employer] her.   No alcohol or caffeine tobacco   06/27/2016   Last update    Family History  Problem Relation Age of Onset  . Lung cancer Mother   . Alcohol abuse Mother   . Colon cancer Brother        late 12's  . Liver cancer Brother   . Alcohol abuse Brother   . Cirrhosis Sister        secondary medication  . Diabetes Sister   . Kidney failure Brother        Dialysis at 53, born single kidney  . Esophageal cancer Neg Hx   . Stomach cancer Neg Hx   . Rectal cancer Neg Hx     Outpatient Encounter Medications as of 11/27/2018  Medication Sig  . ACCU-CHEK FASTCLIX LANCETS MISC For testing blood sugars 1-2 times daily. DX: R73.01  . ACCU-CHEK SMARTVIEW test strip TEST BLOOD SUGAR TWICE DAILY  . acetaminophen (TYLENOL) 500 MG tablet Take 1 tablet (500 mg total) by mouth every 6 (six) hours as needed.  . Alcohol Swabs (B-D SINGLE USE SWABS REGULAR) PADS USE  ONCE  TO TWICE DAILY  . baclofen (LIORESAL) 10 MG tablet   . Blood Glucose Calibration (ACCU-CHEK SMARTVIEW CONTROL) LIQD To be used to calibrate glucose meter as needed.  . Blood Glucose Monitoring Suppl (ACCU-CHEK NANO SMARTVIEW) w/Device KIT Check fasting blood sugars every morning and once after largest meal of the day a few x a week. DX:R73.01  . cetirizine (ZYRTEC ALLERGY) 10 MG tablet Take 1 tablet (10 mg total) by mouth daily.  . Cyanocobalamin (VITAMIN B-12 PO) Take 1 tablet by mouth every Wednesday.  Marland Kitchen desvenlafaxine (PRISTIQ) 100 MG 24 hr tablet Take 1 tablet (100 mg total) by mouth  daily.  Mariane Baumgarten Sodium (COLACE PO)   .  EPINEPHrine 0.3 mg/0.3 mL IJ SOAJ injection Inject 0.3 mLs (0.3 mg total) into the muscle once.  . gabapentin (NEURONTIN) 300 MG capsule TAKE 2 CAPSULES (600 MG TOTAL) BY MOUTH DAILY.  Marland Kitchen ibuprofen (ADVIL,MOTRIN) 600 MG tablet Take 1 tablet (600 mg total) by mouth every 6 (six) hours as needed.  . meclizine (ANTIVERT) 25 MG tablet Take one tab PO BID to TID prn dizziness  . metFORMIN (GLUCOPHAGE-XR) 500 MG 24 hr tablet TAKE 1 TABLET (500 MG TOTAL) BY MOUTH DAILY WITH BREAKFAST.  Marland Kitchen metoCLOPramide (REGLAN) 10 MG tablet Take 1 tablet (10 mg total) by mouth every 6 (six) hours.  . metoprolol succinate (TOPROL-XL) 25 MG 24 hr tablet TAKE 1 TABLET EVERY DAY  . omeprazole (PRILOSEC) 40 MG capsule Take 1 capsule (40 mg total) by mouth daily.  . ondansetron (ZOFRAN-ODT) 4 MG disintegrating tablet DISSOLVE 1 TABLET ON THE TONGUE EVERY 8 HOURS AS NEEDED FOR NAUSEA AND VOMITING  . triamcinolone ointment (KENALOG) 0.5 % Apply 1 application topically 2 (two) times daily. To affected area, avoid eyes and face  . [DISCONTINUED] amoxicillin (AMOXIL) 875 MG tablet Take 1 tablet (875 mg total) by mouth 2 (two) times daily. (Rx void after 11/12/18   No facility-administered encounter medications on file as of 11/27/2018.          Objective:   Physical Exam Constitutional:      Appearance: She is well-developed.  HENT:     Head: Normocephalic and atraumatic.  Cardiovascular:     Rate and Rhythm: Normal rate and regular rhythm.     Heart sounds: Normal heart sounds.  Pulmonary:     Effort: Pulmonary effort is normal.     Breath sounds: Normal breath sounds.  Musculoskeletal:     Comments: Palpable cyst on the distal first MT outer edge.   Skin:    General: Skin is warm and dry.  Neurological:     Mental Status: She is alert and oriented to person, place, and time.  Psychiatric:        Behavior: Behavior normal.        Assessment & Plan:   IFG - Normal  foot exam today.  A1c elevated slightly from previous.  A1c of 6.1 today.  Foot exam and urine microalbumin performed.  Follow-up in 4 months instead of 6 months since she has had a bump in her level.  She is also gained a few pounds which is likely contributing.  Encouraged her to get back on track with diet and exercise.  Aortic calcification-currently on a statin.  Liver fibrosis suspected/nonalcoholic fatty liver disease- due for 2nd twinrix today. Monitor LFTs Q6 mo.    Cyst on foot, likely mucinous -10 you to monitor for now.  If it becomes painful or irritating or sore then let me know and we can refer her for drainage again.  Urinary incontinence- will check UA.  If normal then plan for her to get back in with her urologist for further work-up.  She says she is due for an appointment soon anyway.

## 2018-12-02 DIAGNOSIS — M545 Low back pain: Secondary | ICD-10-CM | POA: Diagnosis not present

## 2018-12-02 DIAGNOSIS — M5116 Intervertebral disc disorders with radiculopathy, lumbar region: Secondary | ICD-10-CM | POA: Diagnosis not present

## 2018-12-02 DIAGNOSIS — G894 Chronic pain syndrome: Secondary | ICD-10-CM | POA: Diagnosis not present

## 2018-12-04 DIAGNOSIS — I700 Atherosclerosis of aorta: Secondary | ICD-10-CM | POA: Diagnosis not present

## 2018-12-04 DIAGNOSIS — R7301 Impaired fasting glucose: Secondary | ICD-10-CM | POA: Diagnosis not present

## 2018-12-04 DIAGNOSIS — K760 Fatty (change of) liver, not elsewhere classified: Secondary | ICD-10-CM | POA: Diagnosis not present

## 2018-12-04 NOTE — Progress Notes (Deleted)
Subjective:   Theresa Scott is a 63 y.o. female who presents for an Initial Medicare Annual Wellness Visit.  Review of Systems    ***        Objective:    There were no vitals filed for this visit. There is no height or weight on file to calculate BMI.  Advanced Directives 01/08/2018 12/04/2017 01/15/2017 01/07/2017 12/17/2015 04/06/2015 01/27/2013  Does Patient Have a Medical Advance Directive? No No No No No No Patient does not have advance directive  Would patient like information on creating a medical advance directive? - No - Patient declined - - No - patient declined information No - patient declined information -    Current Medications (verified) Outpatient Encounter Medications as of 12/11/2018  Medication Sig  . ACCU-CHEK FASTCLIX LANCETS MISC For testing blood sugars 1-2 times daily. DX: R73.01  . ACCU-CHEK SMARTVIEW test strip TEST BLOOD SUGAR TWICE DAILY  . acetaminophen (TYLENOL) 500 MG tablet Take 1 tablet (500 mg total) by mouth every 6 (six) hours as needed.  . Alcohol Swabs (B-D SINGLE USE SWABS REGULAR) PADS USE  ONCE  TO TWICE DAILY  . baclofen (LIORESAL) 10 MG tablet   . Blood Glucose Calibration (ACCU-CHEK SMARTVIEW CONTROL) LIQD To be used to calibrate glucose meter as needed.  . Blood Glucose Monitoring Suppl (ACCU-CHEK NANO SMARTVIEW) w/Device KIT Check fasting blood sugars every morning and once after largest meal of the day a few x a week. DX:R73.01  . cetirizine (ZYRTEC ALLERGY) 10 MG tablet Take 1 tablet (10 mg total) by mouth daily.  . Cyanocobalamin (VITAMIN B-12 PO) Take 1 tablet by mouth every Wednesday.  Marland Kitchen desvenlafaxine (PRISTIQ) 100 MG 24 hr tablet Take 1 tablet (100 mg total) by mouth daily.  Mariane Baumgarten Sodium (COLACE PO)   . EPINEPHrine 0.3 mg/0.3 mL IJ SOAJ injection Inject 0.3 mLs (0.3 mg total) into the muscle once.  . gabapentin (NEURONTIN) 300 MG capsule TAKE 2 CAPSULES (600 MG TOTAL) BY MOUTH DAILY.  Marland Kitchen ibuprofen (ADVIL,MOTRIN) 600 MG tablet  Take 1 tablet (600 mg total) by mouth every 6 (six) hours as needed.  . meclizine (ANTIVERT) 25 MG tablet Take one tab PO BID to TID prn dizziness  . metFORMIN (GLUCOPHAGE-XR) 500 MG 24 hr tablet TAKE 1 TABLET (500 MG TOTAL) BY MOUTH DAILY WITH BREAKFAST.  Marland Kitchen metoCLOPramide (REGLAN) 10 MG tablet Take 1 tablet (10 mg total) by mouth every 6 (six) hours.  . metoprolol succinate (TOPROL-XL) 25 MG 24 hr tablet TAKE 1 TABLET EVERY DAY  . omeprazole (PRILOSEC) 40 MG capsule Take 1 capsule (40 mg total) by mouth daily.  . ondansetron (ZOFRAN-ODT) 4 MG disintegrating tablet DISSOLVE 1 TABLET ON THE TONGUE EVERY 8 HOURS AS NEEDED FOR NAUSEA AND VOMITING  . triamcinolone ointment (KENALOG) 0.5 % Apply 1 application topically 2 (two) times daily. To affected area, avoid eyes and face   No facility-administered encounter medications on file as of 12/11/2018.    [facility]  History: Past Medical History:  Diagnosis Date  . Chronic kidney disease   . Complication of anesthesia    hard to wake up and swelling after kidney stone removal in 1980 per pt  . Degenerative disc disease   . Depression   . Diabetes mellitus   . Fatty liver disease, nonalcoholic   . Gallstones   . GERD (gastroesophageal reflux disease)   . Hypercholesterolemia   . Hypertension   . Kidney stones   . Obesity   .  Osteoporosis   . Pancreatitis    Past Surgical History:  Procedure Laterality Date  . BACK SURGERY  1990,2003,2004,2010  . CARDIAC CATHETERIZATION  2011   [facility].   Marland Kitchen CARDIAC SURGERY  2002   ablation  . CHOLECYSTECTOMY  1976  . COLONOSCOPY  2008  . ESOPHAGOGASTRODUODENOSCOPY N/A 01/27/2013   Procedure: ESOPHAGOGASTRODUODENOSCOPY (EGD) upper endoscopy;  Surgeon: Shann Medal, MD;  Location: [facility];  Service: General;  Laterality: N/A;  . 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 HAND SURGERY  2000   right  . TONSILLECTOMY  1971   Family History  Problem Relation Age of Onset  . Lung cancer Mother   . Alcohol  abuse Mother   . Colon cancer Brother        late 53's  . Liver cancer Brother   . Alcohol abuse Brother   . Cirrhosis Sister        secondary medication  . Diabetes Sister   . Kidney failure Brother        Dialysis at 107, born single kidney  . Esophageal cancer Neg Hx   . Stomach cancer Neg Hx   . Rectal cancer Neg Hx    Social History   Socioeconomic History  . Marital status: Married    Spouse name: Not on file  . Number of children: Not on file  . Years of education: Not on file  . Highest education level: Not on file  Occupational History  . Not on file  Social Needs  . Financial resource strain: Not on file  . Food insecurity:    Worry: Not on file    Inability: Not on file  . Transportation needs:    Medical: Not on file    Non-medical: Not on file  Tobacco Use  . Smoking status: Former Smoker    Packs/day: 0.50    Years: 18.00    Pack years: 9.00    Last attempt to quit: 08/04/1990    Years since quitting: 28.3  . Smokeless tobacco: Never Used  Substance and Sexual Activity  . Alcohol use: No  . Drug use: No  . Sexual activity: Not on file  Lifestyle  . Physical activity:    Days per week: Not on file    Minutes per session: Not on file  . Stress: Not on file  Relationships  . Social connections:    Talks on phone: Not on file    Gets together: Not on file    Attends religious service: Not on file    Active member of club or organization: Not on file    Attends meetings of clubs or organizations: Not on file    Relationship status: Not on file  Other Topics Concern  . Not on file  Social History Narrative   Married 1 son one daughter   Disabled, chronic back pain, former [place] her.   No alcohol or caffeine tobacco   06/27/2016   Last update    Tobacco Counseling Counseling given: Not Answered   Clinical Intake:                        Activities of Daily Living No flowsheet data  found.   Immunizations and Health Maintenance Immunization History  Administered Date(s) Administered  . Hep A / Hep B 08/06/2018, 11/27/2018  . Influenza Split 08/15/2011, 09/09/2012  . Influenza Whole 07/20/2008, 08/12/2009, 08/03/2010  . Influenza, High Dose Seasonal PF 07/18/2017  .  Influenza,inj,Quad PF,6+ Mos 06/26/2013, 09/15/2014, 07/01/2015, 08/21/2016, 08/06/2018  . Pneumococcal Polysaccharide-23 08/06/2018  . Pneumococcal-Unspecified 10/23/2001  . Tdap 11/19/2015   Health Maintenance Due  Topic Date Due  . DEXA SCAN  09/28/2018  . PAP SMEAR-Modifier  12/16/2018    Patient Care Team: Hali Marry, MD as PCP - General (Family Medicine) Lacie Scotts, MD as Consulting Physician Carlean Purl Ofilia Neas, MD as Consulting Physician (Gastroenterology)  Indicate any recent Medical Services you may have received from other than [facility] providers in the past year (date may be approximate).     Assessment:   This is a routine wellness examination for [place].Physical assessment deferred to PCP.   Hearing/Vision screen No exam data present  Dietary issues and exercise activities discussed:    Goals   None    Depression Screen PHQ 2/9 Scores 11/27/2018 05/14/2018 07/18/2017 10/24/2016 04/17/2016 12/17/2015  PHQ - 2 Score - 0 0 2 0 0  PHQ- 9 Score - - - 10 - -  Exception Documentation Patient refusal - - - - -    Fall Risk Fall Risk  07/18/2017 04/17/2016 12/17/2015  Falls in the past year? Yes No No  Number falls in past yr: 1 - -    Is the patient's home free of loose throw rugs in walkways, pet beds, electrical cords, etc?   {Blank single:19197::"yes","no"}      Grab bars in the bathroom? {Blank single:19197::"yes","no"}      Handrails on the stairs?   {Blank single:19197::"yes","no"}      Adequate lighting?   {Blank single:19197::"yes","no"}   Cognitive Function:        Screening Tests Health Maintenance  Topic Date Due  . DEXA SCAN  09/28/2018  . PAP  SMEAR-Modifier  12/16/2018  . HEMOGLOBIN A1C  05/28/2019  . OPHTHALMOLOGY EXAM  07/16/2019  . FOOT EXAM  11/28/2019  . URINE MICROALBUMIN  11/28/2019  . MAMMOGRAM  07/05/2020  . TETANUS/TDAP  11/18/2025  . COLONOSCOPY  12/21/2027  . INFLUENZA VACCINE  Completed  . PNEUMOCOCCAL POLYSACCHARIDE VACCINE AGE 51-64 HIGH RISK  Completed  . Hepatitis C Screening  Completed  . HIV Screening  Completed       Plan:   ***  I have personally reviewed and noted the following in the patient's chart:   . Medical and social history . Use of alcohol, tobacco or illicit drugs  . Current medications and supplements . Functional ability and status . Nutritional status . Physical activity . Advanced directives . List of other physicians . Hospitalizations, surgeries, and ER visits in previous 12 months . Vitals . Screenings to include cognitive, depression, and falls . Referrals and appointments  In addition, I have reviewed and discussed with patient certain preventive protocols, quality metrics, and best practice recommendations. A written personalized care plan for preventive services as well as general preventive health recommendations were provided to patient.     Joanne Chars, LPN   4/33/2951

## 2018-12-05 LAB — COMPLETE METABOLIC PANEL WITH GFR
AG Ratio: 1.3 (calc) (ref 1.0–2.5)
ALT: 26 U/L (ref 6–29)
AST: 35 U/L (ref 10–35)
Albumin: 3.9 g/dL (ref 3.6–5.1)
Alkaline phosphatase (APISO): 92 U/L (ref 37–153)
BUN: 15 mg/dL (ref 7–25)
CO2: 29 mmol/L (ref 20–32)
Calcium: 9.2 mg/dL (ref 8.6–10.4)
Chloride: 105 mmol/L (ref 98–110)
Creat: 0.7 mg/dL (ref 0.50–0.99)
GFR, Est African American: 108 mL/min/{1.73_m2} (ref >=60)
GFR, Est Non African American: 93 mL/min/{1.73_m2} (ref >=60)
Globulin: 2.9 g/dL (calc) (ref 1.9–3.7)
Glucose, Bld: 108 mg/dL — ABNORMAL HIGH (ref 65–99)
Potassium: 4.4 mmol/L (ref 3.5–5.3)
Sodium: 142 mmol/L (ref 135–146)
Total Bilirubin: 0.6 mg/dL (ref 0.2–1.2)
Total Protein: 6.8 g/dL (ref 6.1–8.1)

## 2018-12-05 LAB — LIPID PANEL
Cholesterol: 125 mg/dL (ref <200)
HDL: 32 mg/dL — ABNORMAL LOW (ref >=50)
LDL Cholesterol (Calc): 75 mg/dL (calc)
Non-HDL Cholesterol (Calc): 93 mg/dL (calc) (ref <130)
Total CHOL/HDL Ratio: 3.9 (calc) (ref <5.0)
Triglycerides: 95 mg/dL (ref <150)

## 2018-12-05 LAB — CBC
HCT: 39.7 % (ref 35.0–45.0)
Hemoglobin: 13.3 g/dL (ref 11.7–15.5)
MCH: 30.1 pg (ref 27.0–33.0)
MCHC: 33.5 g/dL (ref 32.0–36.0)
MCV: 89.8 fL (ref 80.0–100.0)
MPV: 12.8 fL — ABNORMAL HIGH (ref 7.5–12.5)
Platelets: 161 10*3/uL (ref 140–400)
RBC: 4.42 10*6/uL (ref 3.80–5.10)
RDW: 12.1 % (ref 11.0–15.0)
WBC: 5.9 10*3/uL (ref 3.8–10.8)

## 2018-12-11 ENCOUNTER — Ambulatory Visit: Payer: Medicare HMO

## 2018-12-13 ENCOUNTER — Other Ambulatory Visit: Payer: Self-pay | Admitting: Family Medicine

## 2019-01-06 DIAGNOSIS — E119 Type 2 diabetes mellitus without complications: Secondary | ICD-10-CM | POA: Diagnosis not present

## 2019-01-06 DIAGNOSIS — N3941 Urge incontinence: Secondary | ICD-10-CM | POA: Diagnosis not present

## 2019-02-26 ENCOUNTER — Telehealth: Payer: Self-pay | Admitting: Family Medicine

## 2019-02-26 MED ORDER — DESVENLAFAXINE SUCCINATE ER 100 MG PO TB24: 100.0000 mg | ORAL_TABLET | Freq: Every day | ORAL | 3 refills | Status: DC

## 2019-02-26 NOTE — Telephone Encounter (Signed)
Called and informed pt of medication refill being called in for Pristiq 100 mg.   Also advised her of Dr. Gardiner Ramus recommendations regarding the statin. She is fine with this and asked that it be sent to her pharmacy.  She also informed me that her brother has just passed away earlier this week, he had some issues with his heart along with being a diabetic.Theresa Scott, Theresa Scott, CMA

## 2019-02-26 NOTE — Telephone Encounter (Signed)
Called and placed refill for Pristiq 100 mg #90  Customer ID # [id number]  Spoke w/Jermery  Confirmation # [phone]  Next refill :05/05/19 with 2 refills remaining.   Maryruth Eve, Lahoma Crocker,

## 2019-02-26 NOTE — Telephone Encounter (Signed)
Please call patient and let her know that based on her history of calcification in the aorta as well as some peripheral vascular disease is highly recommended that she be on a statin which is a cholesterol-lowering drug.  It usually taken at bedtime and helps reduce plaque buildup.  If she is okay with [place] ordering this then please let me know.

## 2019-02-26 NOTE — Telephone Encounter (Signed)
Pt called and states Tonya usually orders her Pristiq for her. Requesting refill. Routing.

## 2019-02-27 MED ORDER — ATORVASTATIN CALCIUM 20 MG PO TABS: 20.0000 mg | ORAL_TABLET | Freq: Every day | ORAL | 3 refills | Status: DC

## 2019-02-27 NOTE — Telephone Encounter (Signed)
Ok, rx sent to mail order.

## 2019-02-27 NOTE — Addendum Note (Signed)
Addended by: Beatrice Lecher D on: 02/27/2019 11:48 AM   Modules accepted: Orders

## 2019-03-10 ENCOUNTER — Telehealth: Payer: Self-pay | Admitting: *Deleted

## 2019-03-10 NOTE — Telephone Encounter (Signed)
Called to inform pt that her medication (Pristiq 100 mg) is here and up front for p/u.Marland KitchenElouise Scott, [place]

## 2019-03-14 DIAGNOSIS — G894 Chronic pain syndrome: Secondary | ICD-10-CM | POA: Diagnosis not present

## 2019-03-14 DIAGNOSIS — M5116 Intervertebral disc disorders with radiculopathy, lumbar region: Secondary | ICD-10-CM | POA: Diagnosis not present

## 2019-03-14 DIAGNOSIS — M5106 Intervertebral disc disorders with myelopathy, lumbar region: Secondary | ICD-10-CM | POA: Diagnosis not present

## 2019-03-14 DIAGNOSIS — M545 Low back pain: Secondary | ICD-10-CM | POA: Diagnosis not present

## 2019-03-31 ENCOUNTER — Ambulatory Visit (INDEPENDENT_AMBULATORY_CARE_PROVIDER_SITE_OTHER): Payer: Medicare HMO | Admitting: Family Medicine

## 2019-03-31 ENCOUNTER — Encounter: Payer: Self-pay | Admitting: Family Medicine

## 2019-03-31 VITALS — BP 140/88 | HR 57 | Temp 98.3°F | Ht 67.0 in | Wt 192.0 lb

## 2019-03-31 DIAGNOSIS — R7301 Impaired fasting glucose: Secondary | ICD-10-CM

## 2019-03-31 DIAGNOSIS — K760 Fatty (change of) liver, not elsewhere classified: Secondary | ICD-10-CM

## 2019-03-31 DIAGNOSIS — F329 Major depressive disorder, single episode, unspecified: Secondary | ICD-10-CM | POA: Diagnosis not present

## 2019-03-31 DIAGNOSIS — K219 Gastro-esophageal reflux disease without esophagitis: Secondary | ICD-10-CM | POA: Diagnosis not present

## 2019-03-31 NOTE — Progress Notes (Signed)
Pt reports that she has been on a steroid taper. She has an Eye exam tomorrow with Dr. Radford Pax in [place].  Theresa Scott, [place] Crocker, CMA

## 2019-03-31 NOTE — Progress Notes (Signed)
Virtual Visit via Video Note  I connected with Theresa Scott on 03/31/19 at 10:50 AM EDT by a video enabled telemedicine application and verified that I am speaking with the correct person using two identifiers.   I discussed the limitations of evaluation and management by telemedicine and the availability of in person appointments. The patient expressed understanding and agreed to proceed.  Pt was at home and I was in my office for the virtual visit.     Subjective:    CC:   HPI: Impaired fasting glucose-no increased thirst or urination. No symptoms consistent with hypoglycemia.  Pt reports that she has been on a steroid taper or her back.  She says she was having some back pain probably from lifting the baby.  The steroids do seem to be helping.   She has an Eye exam tomorrow with Dr. Radford Pax in [place].  F/U depression -overall she is doing well she and her husband are adopting an infant who they have had since they were 9 days old.  I think the parents have officially given rights in May and they are pursuing full adoption.  She says in fact though need to come in and schedule physicals.  She says overall her mood has been good and well controlled she is not had any concerns.  Is currently on Pristiq and doing well on her regimen.  Her brother passed away a month ago. He was on dialysis.   GERD-overall she is doing well.  It sounds like she does not really use her omeprazole regularly.  Occasionally she will have to use the Zofran for nausea.  Past medical history, Surgical history, Family history not pertinant except as noted below, Social history, Allergies, and medications have been entered into the medical record, reviewed, and corrections made.   Review of Systems: No fevers, chills, night sweats, weight loss, chest pain, or shortness of breath.   Objective:    General: Speaking clearly in complete sentences without any shortness of breath.  Alert and oriented x3.  Normal  judgment. No apparent acute distress. Well groomed.     Impression and Recommendations:   IFG - continue with the metformin. Due for A1C in august.  We will go to the lab at her convenience.  Depression -able on Pristiq.  Continue current regimen.  Follow-up in 6 months if otherwise doing well.  Depression screen is actually negative.  Me happy to schedule her physical for adoption as well if so her husband at her convenience.  GERD-using omeprazole as needed.  Nonalcoholic fatty liver disease-due to recheck liver enzymes.      I discussed the assessment and treatment plan with the patient. The patient was provided an opportunity to ask questions and all were answered. The patient agreed with the plan and demonstrated an understanding of the instructions.   The patient was advised to call back or seek an in-person evaluation if the symptoms worsen or if the condition fails to improve as anticipated.   Beatrice Lecher, MD

## 2019-04-01 DIAGNOSIS — Z961 Presence of intraocular lens: Secondary | ICD-10-CM | POA: Diagnosis not present

## 2019-05-08 ENCOUNTER — Other Ambulatory Visit: Payer: Self-pay | Admitting: Family Medicine

## 2019-05-14 ENCOUNTER — Ambulatory Visit (INDEPENDENT_AMBULATORY_CARE_PROVIDER_SITE_OTHER): Payer: Medicare HMO | Admitting: Family Medicine

## 2019-05-14 ENCOUNTER — Other Ambulatory Visit: Payer: Self-pay

## 2019-05-14 VITALS — BP 124/75 | HR 67 | Temp 97.9°F | Wt 191.0 lb

## 2019-05-14 DIAGNOSIS — R300 Dysuria: Secondary | ICD-10-CM

## 2019-05-14 DIAGNOSIS — E1122 Type 2 diabetes mellitus with diabetic chronic kidney disease: Secondary | ICD-10-CM | POA: Diagnosis not present

## 2019-05-14 DIAGNOSIS — N3001 Acute cystitis with hematuria: Secondary | ICD-10-CM

## 2019-05-14 LAB — POCT URINALYSIS DIPSTICK
Bilirubin, UA: NEGATIVE
Glucose, UA: NEGATIVE
Ketones, UA: NEGATIVE
Nitrite, UA: NEGATIVE
Protein, UA: NEGATIVE
Spec Grav, UA: 1.025 (ref 1.010–1.025)
Urobilinogen, UA: 0.2 E.U./dL
pH, UA: 5.5 (ref 5.0–8.0)

## 2019-05-14 MED ORDER — SULFAMETHOXAZOLE-TRIMETHOPRIM 800-160 MG PO TABS: 1.0000 | ORAL_TABLET | Freq: Two times a day (BID) | ORAL | 0 refills | Status: DC

## 2019-05-14 NOTE — Progress Notes (Signed)
Acute Office Visit  Subjective:    Patient ID: Theresa Scott, female    DOB: Mar 21, 1956, 63 y.o.   MRN: [id number]  No chief complaint on file.   HPI Patient is in today for dysuria.  She says she noticed on Friday that after going to the bathroom she was noticing a little bit of blood when she wiped.  And getting a little discomfort while she was urinating.  She really push fluids and cranberry juice and felt better for a day or 2 and then started noticing blood when she would urinate.  Now she is getting more significant pain mostly at the end of urination.  No back pain.  No fevers chills or sweats.  No nausea vomiting or diarrhea.  No recent antibiotic use or exposure no recent urologic procedures.  Past Medical History:  Diagnosis Date  . Chronic kidney disease   . Complication of anesthesia    hard to wake up and swelling after kidney stone removal in 1980 per pt  . Degenerative disc disease   . Depression   . Diabetes mellitus   . Fatty liver disease, nonalcoholic   . Gallstones   . GERD (gastroesophageal reflux disease)   . Hypercholesterolemia   . Hypertension   . Kidney stones   . Obesity   . Osteoporosis   . Pancreatitis     Past Surgical History:  Procedure Laterality Date  . BACK SURGERY  1990,2003,2004,2010  . CARDIAC CATHETERIZATION  2011   [facility].   Marland Kitchen CARDIAC SURGERY  2002   ablation  . CHOLECYSTECTOMY  1976  . COLONOSCOPY  2008  . ESOPHAGOGASTRODUODENOSCOPY N/A 01/27/2013   Procedure: ESOPHAGOGASTRODUODENOSCOPY (EGD) upper endoscopy;  Surgeon: Shann Medal, MD;  Location: [facility];  Service: General;  Laterality: N/A;  . 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 HAND SURGERY  2000   right  . TONSILLECTOMY  1971    Family History  Problem Relation Age of Onset  . Lung cancer Mother   . Alcohol abuse Mother   . Colon cancer Brother        late 20's  . Liver cancer Brother   . Alcohol abuse Brother   . Cirrhosis Sister        secondary medication   . Diabetes Sister   . Kidney failure Brother        Dialysis at 72, born single kidney  . Esophageal cancer Neg Hx   . Stomach cancer Neg Hx   . Rectal cancer Neg Hx     Social History   Socioeconomic History  . Marital status: Married    Spouse name: Not on file  . Number of children: Not on file  . Years of education: Not on file  . Highest education level: Not on file  Occupational History  . Not on file  Social Needs  . Financial resource strain: Not on file  . Food insecurity    Worry: Not on file    Inability: Not on file  . Transportation needs    Medical: Not on file    Non-medical: Not on file  Tobacco Use  . Smoking status: Former Smoker    Packs/day: 0.50    Years: 18.00    Pack years: 9.00    Quit date: 08/04/1990    Years since quitting: 28.7  . Smokeless tobacco: Never Used  Substance and Sexual Activity  . Alcohol use: No  . Drug use: No  . Sexual activity: Not  on file  Lifestyle  . Physical activity    Days per week: Not on file    Minutes per session: Not on file  . Stress: Not on file  Relationships  . Social Herbalist on phone: Not on file    Gets together: Not on file    Attends religious service: Not on file    Active member of club or organization: Not on file    Attends meetings of clubs or organizations: Not on file    Relationship status: Not on file  . Intimate partner violence    Fear of current or ex partner: Not on file    Emotionally abused: Not on file    Physically abused: Not on file    Forced sexual activity: Not on file  Other Topics Concern  . Not on file  Social History Narrative   Married 1 son one daughter   Disabled, chronic back pain, former [place] her.   No alcohol or caffeine tobacco   06/27/2016   Last update    Outpatient Medications Prior to Visit  Medication Sig Dispense Refill  . ACCU-CHEK FASTCLIX LANCETS MISC For testing blood sugars 1-2 times daily. DX: R73.01 306  each 4  . ACCU-CHEK SMARTVIEW test strip TEST BLOOD SUGAR TWICE DAILY 200 each 11  . Alcohol Swabs (B-D SINGLE USE SWABS REGULAR) PADS USE  ONCE  TO TWICE DAILY 200 each 4  . atorvastatin (LIPITOR) 20 MG tablet Take 1 tablet (20 mg total) by mouth daily. 90 tablet 3  . baclofen (LIORESAL) 10 MG tablet     . Blood Glucose Calibration (ACCU-CHEK SMARTVIEW CONTROL) LIQD To be used to calibrate glucose meter as needed. 1 each prn  . Blood Glucose Monitoring Suppl (ACCU-CHEK NANO SMARTVIEW) w/Device KIT Check fasting blood sugars every morning and once after largest meal of the day a few x a week. DX:R73.01 1 kit 0  . cetirizine (ZYRTEC ALLERGY) 10 MG tablet Take 1 tablet (10 mg total) by mouth daily. 30 tablet 0  . Cyanocobalamin (VITAMIN B-12 PO) Take 1 tablet by mouth every Wednesday.    Marland Kitchen desvenlafaxine (PRISTIQ) 100 MG 24 hr tablet Take 1 tablet (100 mg total) by mouth daily. 90 tablet 3  . Docusate Sodium (COLACE PO)     . EPINEPHrine 0.3 mg/0.3 mL IJ SOAJ injection Inject 0.3 mLs (0.3 mg total) into the muscle once. 2 Device prn  . gabapentin (NEURONTIN) 300 MG capsule TAKE 2 CAPSULES EVERY DAY 180 capsule 1  . meclizine (ANTIVERT) 25 MG tablet Take one tab PO BID to TID prn dizziness 12 tablet 0  . metFORMIN (GLUCOPHAGE-XR) 500 MG 24 hr tablet TAKE 1 TABLET DAILY WITH BREAKFAST. 90 tablet 3  . metoprolol succinate (TOPROL-XL) 25 MG 24 hr tablet TAKE 1 TABLET EVERY DAY 90 tablet 1  . omeprazole (PRILOSEC) 40 MG capsule TAKE 1 CAPSULE (40 MG TOTAL) BY MOUTH DAILY. 90 capsule 3  . ondansetron (ZOFRAN-ODT) 4 MG disintegrating tablet DISSOLVE 1 TABLET ON THE TONGUE EVERY 8 HOURS AS NEEDED FOR NAUSEA AND VOMITING 60 tablet 4  . Oxycodone HCl 10 MG TABS Take 10 mg by mouth daily.     No facility-administered medications prior to visit.     Allergies  Allergen Reactions  . Bee Venom Anaphylaxis  . Loratadine Itching    REACTION: dizziness  . Nsaids Other (See Comments)    GI intolerance.     . Latex Rash  ROS     Objective:    Physical Exam  BP 124/75   Pulse 67   Temp 97.9 F (36.6 C) (Oral)   Wt 191 lb (86.6 kg)   BMI 29.91 kg/m  Wt Readings from Last 3 Encounters:  05/14/19 191 lb (86.6 kg)  03/31/19 192 lb (87.1 kg)  11/27/18 194 lb (88 kg)    Health Maintenance Due  Topic Date Due  . DEXA SCAN  09/28/2018  . PAP SMEAR-Modifier  12/16/2018    There are no preventive care reminders to display for this patient.   Lab Results  Component Value Date   TSH 1.32 11/16/2016   Lab Results  Component Value Date   WBC 5.9 12/04/2018   HGB 13.3 12/04/2018   HCT 39.7 12/04/2018   MCV 89.8 12/04/2018   PLT 161 12/04/2018   Lab Results  Component Value Date   NA 142 12/04/2018   K 4.4 12/04/2018   CO2 29 12/04/2018   GLUCOSE 108 (H) 12/04/2018   BUN 15 12/04/2018   CREATININE 0.70 12/04/2018   BILITOT 0.6 12/04/2018   ALKPHOS 95 11/07/2016   AST 35 12/04/2018   ALT 26 12/04/2018   PROT 6.8 12/04/2018   ALBUMIN 3.6 11/07/2016   CALCIUM 9.2 12/04/2018   Lab Results  Component Value Date   CHOL 125 12/04/2018   Lab Results  Component Value Date   HDL 32 (L) 12/04/2018   Lab Results  Component Value Date   LDLCALC 75 12/04/2018   Lab Results  Component Value Date   TRIG 95 12/04/2018   Lab Results  Component Value Date   CHOLHDL 3.9 12/04/2018   Lab Results  Component Value Date   HGBA1C 6.1 (A) 11/27/2018       Assessment & Plan:   Problem List Items Addressed This Visit    None    Visit Diagnoses    Dysuria    -  Primary   Relevant Orders   POCT Urinalysis Dipstick (Completed)   Urine Culture   Acute cystitis with hematuria         Urinary tract infection-wet and treat with Bactrim.  Prescription sent to pharmacy if not better in 3 to 4 days please let us know.  Culture sent for further evaluation as well.  Did encourage her to call the office back if she develops any new symptoms such as nausea vomiting or  fever.  Meds ordered this encounter  Medications  . sulfamethoxazole-trimethoprim (BACTRIM DS) 800-160 MG tablet    Sig: Take 1 tablet by mouth 2 (two) times daily.    Dispense:  6 tablet    Refill:  0     Beatrice Lecher, MD

## 2019-05-16 ENCOUNTER — Telehealth: Payer: Self-pay

## 2019-05-16 LAB — URINE CULTURE
MICRO NUMBER:: 693654
SPECIMEN QUALITY:: ADEQUATE

## 2019-05-16 NOTE — Telephone Encounter (Signed)
OK, I would complete the antibiotic and then have her repeat culture on Monday with a clean catch.

## 2019-05-16 NOTE — Telephone Encounter (Signed)
Channell did receive the results. She states she feels only slightly better. She still has painful urination and urgency.

## 2019-05-19 NOTE — Telephone Encounter (Signed)
Called patient, states she continued with antibiotic and is feeling better, does not feel repeat culture is needed.

## 2019-06-02 DIAGNOSIS — M5106 Intervertebral disc disorders with myelopathy, lumbar region: Secondary | ICD-10-CM | POA: Diagnosis not present

## 2019-06-02 DIAGNOSIS — M545 Low back pain: Secondary | ICD-10-CM | POA: Diagnosis not present

## 2019-06-02 DIAGNOSIS — G894 Chronic pain syndrome: Secondary | ICD-10-CM | POA: Diagnosis not present

## 2019-06-02 DIAGNOSIS — M5116 Intervertebral disc disorders with radiculopathy, lumbar region: Secondary | ICD-10-CM | POA: Diagnosis not present

## 2019-06-05 ENCOUNTER — Ambulatory Visit (INDEPENDENT_AMBULATORY_CARE_PROVIDER_SITE_OTHER): Payer: Medicare HMO | Admitting: Family Medicine

## 2019-06-05 ENCOUNTER — Other Ambulatory Visit: Payer: Self-pay

## 2019-06-05 ENCOUNTER — Telehealth: Payer: Self-pay | Admitting: Family Medicine

## 2019-06-05 ENCOUNTER — Other Ambulatory Visit: Payer: Self-pay | Admitting: Family Medicine

## 2019-06-05 ENCOUNTER — Encounter: Payer: Self-pay | Admitting: Family Medicine

## 2019-06-05 VITALS — BP 116/57 | HR 57 | Temp 98.4°F | Ht 67.0 in | Wt 192.0 lb

## 2019-06-05 DIAGNOSIS — M810 Age-related osteoporosis without current pathological fracture: Secondary | ICD-10-CM

## 2019-06-05 DIAGNOSIS — Z1231 Encounter for screening mammogram for malignant neoplasm of breast: Secondary | ICD-10-CM

## 2019-06-05 DIAGNOSIS — K760 Fatty (change of) liver, not elsewhere classified: Secondary | ICD-10-CM

## 2019-06-05 DIAGNOSIS — R7301 Impaired fasting glucose: Secondary | ICD-10-CM

## 2019-06-05 DIAGNOSIS — Z23 Encounter for immunization: Secondary | ICD-10-CM

## 2019-06-05 MED ORDER — AMBULATORY NON FORMULARY MEDICATION: 0 refills | Status: DC

## 2019-06-05 NOTE — Progress Notes (Signed)
Subjective:     Theresa Scott is a 63 y.o. female and is here for a comprehensive physical exam. The patient reports no problems.  She has a form that she is brought from [place] division of social services to be completed.  Social History   Socioeconomic History  . Marital status: Married    Spouse name: Not on file  . Number of children: Not on file  . Years of education: Not on file  . Highest education level: Not on file  Occupational History  . Not on file  Social Needs  . Financial resource strain: Not on file  . Food insecurity    Worry: Not on file    Inability: Not on file  . Transportation needs    Medical: Not on file    Non-medical: Not on file  Tobacco Use  . Smoking status: Former Smoker    Packs/day: 0.50    Years: 18.00    Pack years: 9.00    Quit date: 08/04/1990    Years since quitting: 28.8  . Smokeless tobacco: Never Used  Substance and Sexual Activity  . Alcohol use: No  . Drug use: No  . Sexual activity: Not on file  Lifestyle  . Physical activity    Days per week: Not on file    Minutes per session: Not on file  . Stress: Not on file  Relationships  . Social Herbalist on phone: Not on file    Gets together: Not on file    Attends religious service: Not on file    Active member of club or organization: Not on file    Attends meetings of clubs or organizations: Not on file    Relationship status: Not on file  . Intimate partner violence    Fear of current or ex partner: Not on file    Emotionally abused: Not on file    Physically abused: Not on file    Forced sexual activity: Not on file  Other Topics Concern  . Not on file  Social History Narrative   Married 1 son one daughter   Disabled, chronic back pain, former [place] her.   No alcohol or caffeine tobacco   06/27/2016   Last update   Health Maintenance  Topic Date Due  . DEXA SCAN  09/28/2018  . INFLUENZA VACCINE  05/24/2019  . HEMOGLOBIN  A1C  05/28/2019  . OPHTHALMOLOGY EXAM  07/16/2019  . FOOT EXAM  11/28/2019  . URINE MICROALBUMIN  11/28/2019  . MAMMOGRAM  07/05/2020  . PAP SMEAR-Modifier  12/16/2020  . TETANUS/TDAP  11/18/2025  . COLONOSCOPY  12/21/2027  . PNEUMOCOCCAL POLYSACCHARIDE VACCINE AGE 7-64 HIGH RISK  Completed  . Hepatitis C Screening  Completed  . HIV Screening  Completed    The following portions of the patient's history were reviewed and updated as appropriate: allergies, current medications, past family history, past medical history, past social history, past surgical history and problem list.  Review of Systems A comprehensive review of systems was negative.   Objective:    BP (!) 116/57   Pulse (!) 57   Temp 98.4 F (36.9 C)   Ht 5\' 7"  (1.702 m)   Wt 192 lb (87.1 kg)   SpO2 98%   BMI 30.07 kg/m  General appearance: alert, cooperative and appears stated age Head: Normocephalic, without obvious abnormality, atraumatic Eyes: conj clear EOMI, PEERLA Ears: normal TM's and external ear canals both ears Nose:  Nares normal. Septum midline. Mucosa normal. No drainage or sinus tenderness. Throat: lips, mucosa, and tongue normal; teeth and gums normal Neck: no adenopathy, no carotid bruit, no JVD, supple, symmetrical, trachea midline and thyroid not enlarged, symmetric, no tenderness/mass/nodules Back: symmetric, no curvature. ROM normal. No CVA tenderness. Lungs: clear to auscultation bilaterally Breasts: normal appearance, no masses or tenderness Heart: regular rate and rhythm, S1, S2 normal, no murmur, click, rub or gallop Abdomen: soft, non-tender; bowel sounds normal; no masses,  no organomegaly Pelvic: cervix normal in appearance, external genitalia normal, no adnexal masses or tenderness, no cervical motion tenderness, rectovaginal septum normal, uterus normal size, shape, and consistency and vagina normal without discharge Extremities: extremities normal, atraumatic, no cyanosis or  edema Pulses: 2+ and symmetric Skin: Skin color, texture, turgor normal. No rashes or lesions Lymph nodes: Cervical, supraclavicular, and axillary nodes normal. Neurologic: Alert and oriented X 3, normal strength and tone. Normal symmetric reflexes. Normal coordination and gait    Assessment:    Healthy female exam.      Plan:     See After Visit Summary for Counseling Recommendations   Keep up a regular exercise program and make sure you are eating a healthy diet Try to eat 4 servings of dairy a day, or if you are lactose intolerant take a calcium with vitamin D daily.  Your vaccines are up to date.  Pap smear due in 2 years.

## 2019-06-05 NOTE — Patient Instructions (Signed)

## 2019-06-05 NOTE — Addendum Note (Signed)
Addended by: Teddy Spike on: 06/05/2019 09:42 AM   Modules accepted: Orders

## 2019-06-05 NOTE — Telephone Encounter (Signed)
I would like to start Prolia for Theresa Scott, Can we start the authorization process for this.  Her orthopedist didn't want her on a bisphosphonate.

## 2019-06-06 LAB — COMPLETE METABOLIC PANEL WITH GFR
AG Ratio: 1.5 (calc) (ref 1.0–2.5)
ALT: 21 U/L (ref 6–29)
AST: 29 U/L (ref 10–35)
Albumin: 4 g/dL (ref 3.6–5.1)
Alkaline phosphatase (APISO): 78 U/L (ref 37–153)
BUN: 12 mg/dL (ref 7–25)
CO2: 29 mmol/L (ref 20–32)
Calcium: 8.9 mg/dL (ref 8.6–10.4)
Chloride: 107 mmol/L (ref 98–110)
Creat: 0.69 mg/dL (ref 0.50–0.99)
GFR, Est African American: 107 mL/min/{1.73_m2} (ref >=60)
GFR, Est Non African American: 93 mL/min/{1.73_m2} (ref >=60)
Globulin: 2.6 g/dL (calc) (ref 1.9–3.7)
Glucose, Bld: 106 mg/dL — ABNORMAL HIGH (ref 65–99)
Potassium: 4.1 mmol/L (ref 3.5–5.3)
Sodium: 143 mmol/L (ref 135–146)
Total Bilirubin: 0.5 mg/dL (ref 0.2–1.2)
Total Protein: 6.6 g/dL (ref 6.1–8.1)

## 2019-06-06 LAB — HEMOGLOBIN A1C
Hgb A1c MFr Bld: 6.3 % of total Hgb — ABNORMAL HIGH (ref <5.7)
Mean Plasma Glucose: 134 (calc)
eAG (mmol/L): 7.4 (calc)

## 2019-06-06 NOTE — Telephone Encounter (Signed)
Barnet Pall, let me know about auth for this patient when you get a chance.  Thanks!

## 2019-06-08 ENCOUNTER — Other Ambulatory Visit: Payer: Self-pay

## 2019-06-08 ENCOUNTER — Emergency Department
Admission: EM | Admit: 2019-06-08 | Discharge: 2019-06-08 | Disposition: A | Discharge status: Home / Self Care | Payer: Medicare HMO | Source: Home / Self Care | Attending: Family Medicine | Admitting: Family Medicine

## 2019-06-08 DIAGNOSIS — M791 Myalgia, unspecified site: Secondary | ICD-10-CM

## 2019-06-08 DIAGNOSIS — R509 Fever, unspecified: Secondary | ICD-10-CM

## 2019-06-08 DIAGNOSIS — R0602 Shortness of breath: Secondary | ICD-10-CM | POA: Diagnosis not present

## 2019-06-08 DIAGNOSIS — T50Z95A Adverse effect of other vaccines and biological substances, initial encounter: Secondary | ICD-10-CM | POA: Diagnosis not present

## 2019-06-08 DIAGNOSIS — R6889 Other general symptoms and signs: Secondary | ICD-10-CM

## 2019-06-08 DIAGNOSIS — R51 Headache: Secondary | ICD-10-CM | POA: Diagnosis not present

## 2019-06-08 NOTE — Discharge Instructions (Addendum)
Rest.  Increase fluid intake.  Check temperature daily. May take Tylenol as needed for fever, body aches, headache, etc.  If symptoms become significantly worse during the night or over the weekend, proceed to the local emergency room.

## 2019-06-08 NOTE — ED Provider Notes (Signed)
The[facility]    CSN: [id number] Arrival date & time: 06/08/19  1510     History   Chief Complaint Chief Complaint  Patient presents with  . Fever    w/ bodyaches  . Chills  . Dizziness    HPI Theresa Scott is a 63 y.o. female.   At about 1pm yesterday patient received the [employer] flu vaccine.  At about 6pm yesterday she developed myalgias, chills, low grade fever, fatigue, headache, and mild shortness of breath.  Last night she had nausea (without vomiting) and diarrhea.  She denies cough, sore throat, and nasal congestion.  No changes in taste/smell.  The history is provided by the patient.    Past Medical History:  Diagnosis Date  . Chronic kidney disease   . Complication of anesthesia    hard to wake up and swelling after kidney stone removal in 1980 per pt  . Degenerative disc disease   . Depression   . Diabetes mellitus   . Fatty liver disease, nonalcoholic   . Gallstones   . GERD (gastroesophageal reflux disease)   . Hypercholesterolemia   . Hypertension   . Kidney stones   . Obesity   . Osteoporosis   . Pancreatitis     Patient Active Problem List   Diagnosis Date Noted  . Osteoporosis 06/05/2019  . Postauricular adenopathy 08/06/2018  . NAFLD (nonalcoholic fatty liver disease) 12/04/2016  . 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 Aortic calcification ([place]) 04/20/2016  . 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 Daytime somnolence 06/26/2013  . Sleep disorder, circadian 06/26/2013  . History of gastric bypass, 08/22/2010. 11/03/2011  . IMPAIRED FASTING GLUCOSE 12/15/2010  . ANXIETY 01/31/2010  . PSVT 12/01/2009  . STASIS DERMATITIS 04/06/2009  . ALLERGIC RHINITIS 08/19/2008  . DIABETIC PERIPHERAL NEUROPATHY 07/20/2008  . Hyperlipemia 06/18/2008  . OBESITY 06/18/2008  . Major depression, chronic 06/18/2008  . Peripheral vascular disease, unspecified ([place]) 06/18/2008  . GERD 06/18/2008  . OSTEOARTHRITIS  06/18/2008  . INSOMNIA 06/18/2008  . INCONTINENCE 06/18/2008  . PROTEINURIA 06/18/2008  . NEPHROLITHIASIS, HX OF 06/18/2008    Past Surgical History:  Procedure Laterality Date  . BACK SURGERY  1990,2003,2004,2010  . CARDIAC CATHETERIZATION  2011   [place] [facility].   [facility] CARDIAC SURGERY  2002   ablation  . CHOLECYSTECTOMY  1976  . COLONOSCOPY  2008  . ESOPHAGOGASTRODUODENOSCOPY N/A 01/27/2013   Procedure: ESOPHAGOGASTRODUODENOSCOPY (EGD) upper endoscopy;  Surgeon: Shann Medal, MD;  Location: [facility];  Service: General;  Laterality: N/A;  . 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 HAND SURGERY  2000   right  . TONSILLECTOMY  1971    OB History   No obstetric history on file.      Home Medications    Prior to Admission medications   Medication Sig Start Date End Date Taking? Authorizing Provider  ACCU-CHEK FASTCLIX LANCETS MISC For testing blood sugars 1-2 times daily. DX: R73.01 04/11/18   Hali Marry, MD  ACCU-CHEK SMARTVIEW test strip TEST BLOOD SUGAR TWICE DAILY 09/02/18   Hali Marry, MD  Alcohol Swabs (B-D SINGLE USE SWABS REGULAR) PADS USE  ONCE  TO TWICE DAILY 09/16/18   Hali Marry, MD  AMBULATORY NON FORMULARY MEDICATION Medication Name: [place] IM 06/05/19   Hali Marry, MD  atorvastatin (LIPITOR) 20 MG tablet Take 1 tablet (20 mg total) by mouth daily. 02/27/19   Hali Marry, MD  baclofen (LIORESAL) 10  MG tablet  10/05/15   [provider]  Blood Glucose Calibration (ACCU-CHEK SMARTVIEW CONTROL) LIQD To be used to calibrate glucose meter as needed. 07/05/17   Hali Marry, MD  Blood Glucose Monitoring Suppl (ACCU-CHEK NANO SMARTVIEW) w/Device KIT Check fasting blood sugars every morning and once after largest meal of the day a few x a week. DX:R73.01 07/10/17   Hali Marry, MD  cetirizine (ZYRTEC ALLERGY) 10 MG tablet Take 1 tablet (10 mg total) by mouth daily. 01/08/18   Law, Bea Graff, PA-C   Cyanocobalamin (VITAMIN B-12 PO) Take 1 tablet by mouth every Wednesday.    [provider]  desvenlafaxine (PRISTIQ) 100 MG 24 hr tablet Take 1 tablet (100 mg total) by mouth daily. 02/26/19   Hali Marry, MD  Docusate Sodium (COLACE PO)  06/21/16   [provider]  EPINEPHrine 0.3 mg/0.3 mL IJ SOAJ injection Inject 0.3 mLs (0.3 mg total) into the muscle once. 12/17/15   Hali Marry, MD  gabapentin (NEURONTIN) 300 MG capsule TAKE 2 CAPSULES EVERY DAY 05/08/19   Hali Marry, MD  meclizine (ANTIVERT) 25 MG tablet Take one tab PO BID to TID prn dizziness 11/04/18   Kandra Nicolas, MD  metFORMIN (GLUCOPHAGE-XR) 500 MG 24 hr tablet TAKE 1 TABLET DAILY WITH BREAKFAST. 05/08/19   Hali Marry, MD  metoprolol succinate (TOPROL-XL) 25 MG 24 hr tablet TAKE 1 TABLET EVERY DAY 05/08/19   Hali Marry, MD  omeprazole (PRILOSEC) 40 MG capsule TAKE 1 CAPSULE (40 MG TOTAL) BY MOUTH DAILY. 12/13/18   Hali Marry, MD  ondansetron (ZOFRAN-ODT) 4 MG disintegrating tablet DISSOLVE 1 TABLET ON THE TONGUE EVERY 8 HOURS AS NEEDED FOR NAUSEA AND VOMITING 04/18/18   Hali Marry, MD  Oxycodone HCl 10 MG TABS Take 10 mg by mouth daily. 03/14/19   [provider]    Family History Family History  Problem Relation Age of Onset  . Lung cancer Mother   . Alcohol abuse Mother   . Colon cancer Brother        late 35's  . Liver cancer Brother   . Alcohol abuse Brother   . Cirrhosis Sister        secondary medication  . Diabetes Sister   . Kidney failure Brother        Dialysis at 59, born single kidney  . Esophageal cancer Neg Hx   . Stomach cancer Neg Hx   . Rectal cancer Neg Hx     Social History Social History   Tobacco Use  . Smoking status: Former Smoker    Packs/day: 0.50    Years: 18.00    Pack years: 9.00    Quit date: 08/04/1990    Years since quitting: 28.8  . Smokeless tobacco: Never Used  Substance Use Topics   . Alcohol use: No  . Drug use: No     Allergies   Bee venom, Loratadine, Nsaids, and Latex   Review of Systems Review of Systems No sore throat No cough No pleuritic pain No wheezing No nasal congestion No post-nasal drainage No sinus pain/pressure No itchy/red eyes No earache No hemoptysis Mild SOB + fever, + chills + nausea No vomiting No abdominal pain + diarrhea No urinary symptoms No skin rash + fatigue + myalgias/arthralgias + headache    Physical Exam Triage Vital Signs ED Triage Vitals [06/08/19 1540]  Enc Vitals Group     BP 111/70  Pulse Rate 64     Resp      Temp 97.9 F (36.6 C)     Temp Source Oral     SpO2 96 %     Weight 191 lb 12.8 oz (87 kg)     Height '5\' 7"'  (1.702 m)     Head Circumference      Peak Flow      Pain Score 0     Pain Loc      Pain Edu?      Excl. in [place]?    No data found.  Updated Vital Signs BP 111/70 (BP Location: Right Arm)   Pulse 64   Temp 97.9 F (36.6 C) (Oral)   Ht '5\' 7"'  (1.702 m)   Wt 87 kg   SpO2 96%   BMI 30.04 kg/m   Visual Acuity Right Eye Distance:   Left Eye Distance:   Bilateral Distance:    Right Eye Near:   Left Eye Near:    Bilateral Near:     Physical Exam Nursing notes and Vital Signs reviewed. Appearance:  Patient appears stated age, and in no acute distress Eyes:  Pupils are equal, round, and reactive to light and accomodation.  Extraocular movement is intact.  Conjunctivae are not inflamed  Ears:  Canals normal.  Tympanic membranes normal.  Nose:   Normal turbinates.  No sinus tenderness.   Pharynx:  Normal; moist mucous membranes  Neck:  Supple.  No adenopathy. Lungs:  Clear to auscultation.  Breath sounds are equal.  Moving air well. Heart:  Regular rate and rhythm without murmurs, rubs, or gallops.  Abdomen:  Nontender without masses or hepatosplenomegaly.  Bowel sounds are present.  No CVA or flank tenderness.  Extremities:  No edema.  Skin:  No rash present.    UC  Treatments / Results  Labs (all labs ordered are listed, but only abnormal results are displayed) Labs Reviewed - No data to display  EKG   Radiology No results found.  Procedures Procedures (including critical care time)  Medications Ordered in UC Medications - No data to display  Initial Impression / Assessment and Plan / UC Course  I have reviewed the triage vital signs and the nursing notes.  Pertinent labs & imaging results that were available during my care of the patient were reviewed by me and considered in my medical decision making (see chart for details).    Unremarkable physical exam. ?reaction to [place] flu vaccine. Treat symptomatically for now. COVID19 test pending. Followup with Family Doctor if not improved in about 5 days.   Final Clinical Impressions(s) / UC Diagnoses   Final diagnoses:  Influenza-like symptoms     Discharge Instructions     Rest.  Increase fluid intake.  Check temperature daily. May take Tylenol as needed for fever, body aches, headache, etc.  If symptoms become significantly worse during the night or over the weekend, proceed to the local emergency room.     ED Prescriptions    None        Kandra Nicolas, MD 06/10/19 1214

## 2019-06-08 NOTE — ED Triage Notes (Signed)
Pt c/o flu likes sxs since 6pm last night. Went to [facility] and got the [employer] flu vaccine at about 1pm yesterday. Later that evening she started having bodyaches, chills, low grade fever. Also woke up with some dizziness and a headache. Has not tried any OTC meds for sxs.

## 2019-06-09 NOTE — Telephone Encounter (Signed)
Form has been filled out and waiting on provider signature to fax.

## 2019-06-10 NOTE — Telephone Encounter (Signed)
Form has been faced to [facility] and waiting on a response.

## 2019-06-10 NOTE — Progress Notes (Signed)
Subjective:   Theresa Scott is a 63 y.o. female who presents for Medicare Annual (Subsequent) preventive examination.  Review of Systems:  No ROS.  Medicare Wellness Virtual Visit.  Visual/audio telehealth visit, UTA vital signs.   See social history for additional risk factors.    Cardiac Risk Factors include: advanced age (>6mn, >>32women);sedentary lifestyle;dyslipidemia Sleep patterns: Getting 7-8 hours of sleep a night. Wakes up 1 time a night to void. Wakes up feeling sluggish. Home Safety/Smoke Alarms: Feels safe in home. Smoke alarms in place.  Living environment; Lives with husband in a 2 story home, stairs have hand rails in place. Shower is a step over tub combo and no grab bars are in place. Seat Belt Safety/Bike Helmet: Wears seat belt.   Female:   Pap-  UTD     Mammo- Scheduled for 07/09/19       Dexa scan-  UTD     CCS- UTD     Objective:     Vitals: There were no vitals taken for this visit.  There is no height or weight on file to calculate BMI.  Advanced Directives 06/17/2019 01/08/2018 12/04/2017 01/15/2017 01/07/2017 12/17/2015 04/06/2015  Does Patient Have a Medical Advance Directive? _0  No No  Would patient like information on creating a medical advance directive? No - Patient declined - No - Patient declined - - No - patient declined information No - patient declined information    Tobacco Social History   Tobacco Use  Smoking Status Former Smoker  . Packs/day: 0.50  . Years: 18.00  . Pack years: 9.00  . Quit date: 08/04/1990  . Years since quitting: 28.8  Smokeless Tobacco Never Used     Counseling given: Not Answered   Clinical Intake:                       Past Medical History:  Diagnosis Date  . Chronic kidney disease   . Complication of anesthesia    hard to wake up and swelling after kidney stone removal in 1980 per pt  . Degenerative disc disease   . Depression   . Diabetes mellitus   . Fatty liver disease,  nonalcoholic   . Gallstones   . GERD (gastroesophageal reflux disease)   . Hypercholesterolemia   . Hypertension   . Kidney stones   . Obesity   . Osteoporosis   . Pancreatitis    Past Surgical History:  Procedure Laterality Date  . BACK SURGERY  1990,2003,2004,2010  . CARDIAC CATHETERIZATION  2011   [facility].   .Marland KitchenCARDIAC SURGERY  2002   ablation  . CHOLECYSTECTOMY  1976  . COLONOSCOPY  2008  . ESOPHAGOGASTRODUODENOSCOPY N/A 01/27/2013   Procedure: ESOPHAGOGASTRODUODENOSCOPY (EGD) upper endoscopy;  Surgeon: DShann Medal MD;  Location: [facility];  Service: General;  Laterality: N/A;  . 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 HAND SURGERY  2000   right  . TONSILLECTOMY  1971   Family History  Problem Relation Age of Onset  . Lung cancer Mother   . Alcohol abuse Mother   . Colon cancer Brother        late 445's . Liver cancer Brother   . Alcohol abuse Brother   . Cirrhosis Sister        secondary medication  . Diabetes Sister   . Kidney failure Brother        Dialysis at 89 born single kidney  .  Esophageal cancer Neg Hx   . Stomach cancer Neg Hx   . Rectal cancer Neg Hx    Social History   Socioeconomic History  . Marital status: Married    Spouse name: Mateo Flow  . Number of children: 3  . Years of education: 66  . Highest education level: GED or equivalent  Occupational History  . Occupation: [employer]    Comment: retired  Scientific laboratory technician  . Financial resource strain: Not hard at all  . Food insecurity    Worry: Never true    Inability: Never true  . Transportation needs    Medical: No    Non-medical: No  Tobacco Use  . Smoking status: Former Smoker    Packs/day: 0.50    Years: 18.00    Pack years: 9.00    Quit date: 08/04/1990    Years since quitting: 28.8  . Smokeless tobacco: Never Used  Substance and Sexual Activity  . Alcohol use: No  . Drug use: No  . Sexual activity: Yes  Lifestyle  . Physical activity    Days per week: 2 days     Minutes per session: 20 min  . Stress: Not at all  Relationships  . Social connections    Talks on phone: More than three times a week    Gets together: Twice a week    Attends religious service: More than 4 times per year    Active member of club or organization: No    Attends meetings of clubs or organizations: Never    Relationship status: Married  Other Topics Concern  . Not on file  Social History Narrative   Married 1 son one daughter   Disabled, chronic back pain, former [place] her.   No alcohol or caffeine tobacco   06/27/2016   Last update   Adopted 2 children    Outpatient Encounter Medications as of 06/17/2019  Medication Sig  . ACCU-CHEK FASTCLIX LANCETS MISC For testing blood sugars 1-2 times daily. DX: R73.01  . ACCU-CHEK SMARTVIEW test strip TEST BLOOD SUGAR TWICE DAILY  . Alcohol Swabs (B-D SINGLE USE SWABS REGULAR) PADS USE  ONCE  TO TWICE DAILY  . atorvastatin (LIPITOR) 20 MG tablet Take 1 tablet (20 mg total) by mouth daily.  . baclofen (LIORESAL) 10 MG tablet   . Blood Glucose Calibration (ACCU-CHEK SMARTVIEW CONTROL) LIQD To be used to calibrate glucose meter as needed.  . Blood Glucose Monitoring Suppl (ACCU-CHEK NANO SMARTVIEW) w/Device KIT Check fasting blood sugars every morning and once after largest meal of the day a few x a week. DX:R73.01  . cetirizine (ZYRTEC ALLERGY) 10 MG tablet Take 1 tablet (10 mg total) by mouth daily.  . Cyanocobalamin (VITAMIN B-12 PO) Take 1 tablet by mouth every Wednesday.  Marland Kitchen desvenlafaxine (PRISTIQ) 100 MG 24 hr tablet Take 1 tablet (100 mg total) by mouth daily.  Mariane Baumgarten Sodium (COLACE PO)   . EPINEPHrine 0.3 mg/0.3 mL IJ SOAJ injection Inject 0.3 mLs (0.3 mg total) into the muscle once.  . gabapentin (NEURONTIN) 300 MG capsule TAKE 2 CAPSULES EVERY DAY  . meclizine (ANTIVERT) 25 MG tablet Take one tab PO BID to TID prn dizziness  . metFORMIN (GLUCOPHAGE-XR) 500 MG 24 hr tablet TAKE 1 TABLET DAILY  WITH BREAKFAST.  . metoprolol succinate (TOPROL-XL) 25 MG 24 hr tablet TAKE 1 TABLET EVERY DAY  . omeprazole (PRILOSEC) 40 MG capsule TAKE 1 CAPSULE (40 MG TOTAL) BY MOUTH DAILY.  Marland Kitchen ondansetron (  ZOFRAN-ODT) 4 MG disintegrating tablet DISSOLVE 1 TABLET ON THE TONGUE EVERY 8 HOURS AS NEEDED FOR NAUSEA AND VOMITING  . Oxycodone HCl 10 MG TABS Take 10 mg by mouth daily.  Marland Kitchen alendronate (FOSAMAX) 70 MG tablet Take 1 tablet (70 mg total) by mouth every 7 (seven) days. Take with a full glass of water on an empty stomach. (Patient not taking: Reported on 06/17/2019)  . AMBULATORY NON FORMULARY MEDICATION Medication Name: Stanton IM (Patient not taking: Reported on 06/17/2019)   No facility-administered encounter medications on file as of 06/17/2019.     Activities of Daily Living In your present state of health, do you have any difficulty performing the following activities: 06/17/2019  Hearing? N  Vision? N  Difficulty concentrating or making decisions? N  Walking or climbing stairs? N  Dressing or bathing? N  Doing errands, shopping? N  Preparing Food and eating ? N  Using the Toilet? N  In the past six months, have you accidently leaked urine? Y  Comment has a kidney specialists  Do you have problems with loss of bowel control? N  Managing your Medications? N  Managing your Finances? N  Housekeeping or managing your Housekeeping? N  Some recent data might be hidden    Patient Care Team: Hali Marry, MD as PCP - General (Family Medicine) Lacie Scotts, MD as Consulting Physician Carlean Purl Ofilia Neas, MD as Consulting Physician (Gastroenterology)    Assessment:   This is a routine wellness examination for Strong.Physical assessment deferred to PCP.   Exercise Activities and Dietary recommendations Current Exercise Habits: Home exercise routine, Type of exercise: walking, Time (Minutes): 20, Frequency (Times/Week): 3, Weekly Exercise (Minutes/Week): 60, Intensity: Mild,  Exercise limited by: None identified Diet Eats healthy diet with vegetables and fruits and proteins. Breakfast: Cereal or eggs or banana or orange Lunch: sandwich tomato Dinner:  Meat and vegetables.      Goals    . Exercise 3x per week (30 min per time)     Start exercising for 3 days at 30 minutes at a time to increase exercise.       Fall Risk Fall Risk  06/17/2019 06/05/2019 07/18/2017 04/17/2016 12/17/2015  Falls in the past year? 0 0 Yes No No  Number falls in past yr: - 0 1 - -  Injury with Fall? - 0 - - -  Follow up Falls prevention discussed - - - -   Is the patient's home free of loose throw rugs in walkways, pet beds, electrical cords, etc?   yes      Grab bars in the bathroom? no      Handrails on the stairs?   yes      Adequate lighting?   yes   Depression Screen PHQ 2/9 Scores 06/17/2019 06/05/2019 03/31/2019 11/27/2018  PHQ - 2 Score 0 0 0 -  PHQ- 9 Score - - - -  Exception Documentation - - - Patient refusal     Cognitive Function     6CIT Screen 06/17/2019 06/05/2019  What Year? 0 points 0 points  What month? 0 points 0 points  What time? 0 points 0 points  Count back from 20 0 points 0 points  Months in reverse 0 points 0 points  Repeat phrase 2 points 2 points  Total Score 2 2    Immunization History  Administered Date(s) Administered  . Hep A / Hep B 08/06/2018, 11/27/2018  . Influenza Split 08/15/2011, 09/09/2012  . Influenza Whole  07/20/2008, 08/12/2009, 08/03/2010  . Influenza, High Dose Seasonal PF 07/18/2017, 06/07/2019  . Influenza,inj,Quad PF,6+ Mos 06/26/2013, 09/15/2014, 07/01/2015, 08/21/2016, 08/06/2018, 06/07/2019  . Pneumococcal Polysaccharide-23 08/06/2018  . Pneumococcal-Unspecified 10/23/2001  . Tdap 11/19/2015    Screening Tests Health Maintenance  Topic Date Due  . DEXA SCAN  09/28/2018  . OPHTHALMOLOGY EXAM  07/16/2019  . FOOT EXAM  11/28/2019  . URINE MICROALBUMIN  11/28/2019  . HEMOGLOBIN A1C  12/06/2019  . MAMMOGRAM   07/05/2020  . PAP SMEAR-Modifier  12/16/2020  . TETANUS/TDAP  11/18/2025  . COLONOSCOPY  12/21/2027  . INFLUENZA VACCINE  Completed  . PNEUMOCOCCAL POLYSACCHARIDE VACCINE AGE 48-64 HIGH RISK  Completed  . Hepatitis C Screening  Completed  . HIV Screening  Completed       Plan:    Ms. Shvartsman , Thank you for taking time to come for your Medicare Wellness Visit. I appreciate your ongoing commitment to your health goals. Please review the following plan we discussed and let me know if I can assist you in the future.  Please schedule your next medicare wellness visit with me in 1 yr. Continue doing brain stimulating activities (puzzles, reading, adult coloring books, staying active) to keep memory sharp.        These are the goals we discussed: Goals    . Exercise 3x per week (30 min per time)     Start exercising for 3 days at 30 minutes at a time to increase exercise.       This is a list of the screening recommended for you and due dates:  Health Maintenance  Topic Date Due  . DEXA scan (bone density measurement)  09/28/2018  . Eye exam for diabetics  07/16/2019  . Complete foot exam   11/28/2019  . Urine Protein Check  11/28/2019  . Hemoglobin A1C  12/06/2019  . Mammogram  07/05/2020  . Pap Smear  12/16/2020  . Tetanus Vaccine  11/18/2025  . Colon Cancer Screening  12/21/2027  . Flu Shot  Completed  . Pneumococcal vaccine  Completed  .  Hepatitis C: One time screening is recommended by Center for Disease Control  (CDC) for  adults born from [birth year] through 1965.   Completed  . HIV Screening  Completed     I have personally reviewed and noted the following in the patient's chart:   . Medical and social history . Use of alcohol, tobacco or illicit drugs  . Current medications and supplements . Functional ability and status . Nutritional status . Physical activity . Advanced directives . List of other physicians . Hospitalizations, surgeries, and ER visits in previous  12 months . Vitals . Screenings to include cognitive, depression, and falls . Referrals and appointments  In addition, I have reviewed and discussed with patient certain preventive protocols, quality metrics, and best practice recommendations. A written personalized care plan for preventive services as well as general preventive health recommendations were provided to patient.     Joanne Chars, LPN  06/15/2352

## 2019-06-11 ENCOUNTER — Telehealth: Payer: Self-pay | Admitting: Emergency Medicine

## 2019-06-11 LAB — NOVEL CORONAVIRUS, NAA: SARS-CoV-2, NAA: NOT DETECTED

## 2019-06-11 NOTE — Telephone Encounter (Signed)
COVID negative, patient is starting to feel better

## 2019-06-12 NOTE — Telephone Encounter (Addendum)
I got the benefits back and per patient insurance she may have to try and fail an oral medication. I am waiting to hear back about the PA since her insurance requires a PA. FYI.

## 2019-06-13 NOTE — Telephone Encounter (Signed)
I received a message from the insurance that patient will have to try and fail and oral medication before she can start Prolia. Please advise.

## 2019-06-16 MED ORDER — ALENDRONATE SODIUM 70 MG PO TABS: 70.0000 mg | ORAL_TABLET | ORAL | 4 refills | Status: DC

## 2019-06-16 NOTE — Telephone Encounter (Signed)
Okay, prescription sent to mail order as it will likely be cheaper that way.  She will just take 1 pill 1 time a week.  Follow the instructions on the packet.

## 2019-06-16 NOTE — Addendum Note (Signed)
Addended by: Beatrice Lecher D on: 06/16/2019 03:46 PM   Modules accepted: Orders

## 2019-06-16 NOTE — Telephone Encounter (Signed)
Patient is agreeable to take an oral medication before starting on Prolia. Please send to mail order.

## 2019-06-17 ENCOUNTER — Ambulatory Visit (INDEPENDENT_AMBULATORY_CARE_PROVIDER_SITE_OTHER): Payer: Medicare HMO | Admitting: *Deleted

## 2019-06-17 VITALS — BP 115/81 | HR 56 | Temp 98.5°F | Ht 67.0 in | Wt 188.5 lb

## 2019-06-17 DIAGNOSIS — Z0000 Encounter for general adult medical examination without abnormal findings: Secondary | ICD-10-CM | POA: Diagnosis not present

## 2019-06-17 NOTE — Patient Instructions (Signed)
Theresa Scott , Thank you for taking time to come for your Medicare Wellness Visit. I appreciate your ongoing commitment to your health goals. Please review the following plan we discussed and let me know if I can assist you in the future.  Please schedule your next medicare wellness visit with me in 1 yr. Continue doing brain stimulating activities (puzzles, reading, adult coloring books, staying active) to keep memory sharp. These are the goals we discussed: Goals    . Exercise 3x per week (30 min per time)     Start exercising for 3 days at 30 minutes at a time to increase exercise.

## 2019-06-18 ENCOUNTER — Other Ambulatory Visit: Payer: Medicare HMO

## 2019-06-24 ENCOUNTER — Other Ambulatory Visit: Payer: Self-pay

## 2019-06-24 NOTE — Telephone Encounter (Addendum)
Debe states she needs a refill on Pristiq. She states she called a few weeks ago to get the refill. It is ordered through [facility] Patient Assistance Program. I did call to order the refill. It will take 7-10 days. I am not sure if there is a generic available. [facility] has this medication for a generic price. She will be out of her medication tomorrow.    Customer number [id number]  [facility] phone number [phone]  Order # [id number]

## 2019-06-25 NOTE — Telephone Encounter (Signed)
Okay, where would she like a temporary supply sent?  She has 2 local pharmacies on file.

## 2019-07-01 NOTE — Telephone Encounter (Signed)
RX received.   Pristiq 100 mg Lot: [id number] Exp: 02/19/2022 NDC: [id number]  Pt advised, sample up front to be picked up.

## 2019-07-09 ENCOUNTER — Ambulatory Visit (INDEPENDENT_AMBULATORY_CARE_PROVIDER_SITE_OTHER): Payer: Medicare HMO

## 2019-07-09 ENCOUNTER — Other Ambulatory Visit: Payer: Self-pay

## 2019-07-09 DIAGNOSIS — Z1231 Encounter for screening mammogram for malignant neoplasm of breast: Secondary | ICD-10-CM | POA: Diagnosis not present

## 2019-07-09 DIAGNOSIS — Z780 Asymptomatic menopausal state: Secondary | ICD-10-CM | POA: Diagnosis not present

## 2019-07-09 DIAGNOSIS — M810 Age-related osteoporosis without current pathological fracture: Secondary | ICD-10-CM | POA: Diagnosis not present

## 2019-08-18 DIAGNOSIS — G894 Chronic pain syndrome: Secondary | ICD-10-CM | POA: Diagnosis not present

## 2019-08-18 DIAGNOSIS — M545 Low back pain: Secondary | ICD-10-CM | POA: Diagnosis not present

## 2019-08-18 DIAGNOSIS — M5116 Intervertebral disc disorders with radiculopathy, lumbar region: Secondary | ICD-10-CM | POA: Diagnosis not present

## 2019-08-18 DIAGNOSIS — M5106 Intervertebral disc disorders with myelopathy, lumbar region: Secondary | ICD-10-CM | POA: Diagnosis not present

## 2019-09-15 ENCOUNTER — Telehealth: Payer: Self-pay | Admitting: Family Medicine

## 2019-09-15 NOTE — Telephone Encounter (Signed)
Patient called and stated that you were in contact with her patient assistance about her Pristriq. This is the number she was given for you to talk with them. [phone]. Do you know anything about this? Please advise.

## 2019-09-15 NOTE — Telephone Encounter (Signed)
Spoke w/Jerermy at [facility] they will send her medication in 7-10 days.  Conf# [id number].Theresa Scott, Lahoma Crocker, CMA

## 2019-09-15 NOTE — Telephone Encounter (Signed)
Called and advised pt that [facility] was having an issue with their automated process and advised that her medication has been ordered and will be here in 7-10 days and that she will need to renew her application because this one expires 10/23/2019. She stated that she contacted them and they will be sending her an application packet in 1 wk. Maryruth Eve, Lahoma Crocker, CMA

## 2019-09-24 ENCOUNTER — Telehealth: Payer: Self-pay | Admitting: *Deleted

## 2019-09-24 NOTE — Telephone Encounter (Signed)
lvm informing pt that her medication is ready for p/u.Theresa Scott, Narcissa

## 2019-09-26 ENCOUNTER — Telehealth: Payer: Self-pay | Admitting: *Deleted

## 2019-09-26 NOTE — Telephone Encounter (Signed)
Form completed,faxed,confirmation received and scanned into patient's chart..Theresa Scott, CMA  

## 2019-10-28 DIAGNOSIS — Z79891 Long term (current) use of opiate analgesic: Secondary | ICD-10-CM | POA: Diagnosis not present

## 2019-10-28 DIAGNOSIS — G894 Chronic pain syndrome: Secondary | ICD-10-CM | POA: Diagnosis not present

## 2019-10-28 DIAGNOSIS — Z79899 Other long term (current) drug therapy: Secondary | ICD-10-CM | POA: Diagnosis not present

## 2019-10-28 DIAGNOSIS — M545 Low back pain: Secondary | ICD-10-CM | POA: Diagnosis not present

## 2019-10-28 DIAGNOSIS — M5116 Intervertebral disc disorders with radiculopathy, lumbar region: Secondary | ICD-10-CM | POA: Diagnosis not present

## 2019-10-28 DIAGNOSIS — M5106 Intervertebral disc disorders with myelopathy, lumbar region: Secondary | ICD-10-CM | POA: Diagnosis not present

## 2019-10-30 ENCOUNTER — Other Ambulatory Visit: Payer: Medicare HMO

## 2019-10-30 ENCOUNTER — Ambulatory Visit: Payer: Medicare HMO | Attending: Internal Medicine

## 2019-10-30 ENCOUNTER — Telehealth: Payer: Self-pay

## 2019-10-30 ENCOUNTER — Ambulatory Visit (INDEPENDENT_AMBULATORY_CARE_PROVIDER_SITE_OTHER): Payer: Medicare HMO | Admitting: Physician Assistant

## 2019-10-30 VITALS — BP 131/76 | HR 77 | Temp 100.0°F | Ht 67.0 in | Wt 188.0 lb

## 2019-10-30 DIAGNOSIS — R509 Fever, unspecified: Secondary | ICD-10-CM

## 2019-10-30 DIAGNOSIS — Z20822 Contact with and (suspected) exposure to covid-19: Secondary | ICD-10-CM

## 2019-10-30 DIAGNOSIS — J989 Respiratory disorder, unspecified: Secondary | ICD-10-CM | POA: Diagnosis not present

## 2019-10-30 DIAGNOSIS — J111 Influenza due to unidentified influenza virus with other respiratory manifestations: Secondary | ICD-10-CM

## 2019-10-30 MED ORDER — AZITHROMYCIN 250 MG PO TABS: ORAL_TABLET | ORAL | 0 refills | Status: DC

## 2019-10-30 NOTE — Telephone Encounter (Signed)
Theresa Scott called and left a message stating she was sick. I called and left a message for her to call back.

## 2019-10-30 NOTE — Progress Notes (Signed)
Virtual Visit via Video (App used: DOXIMITY) Note  I connected with      Theresa Scott on 02/03/20 at 9:37 PM  by a telemedicine application and verified that I am speaking with the correct person using two identifiers.  Patient is at home in [place], [place] I am in office   I discussed the limitations of evaluation and management by telemedicine and the availability of in person appointments. The patient expressed understanding and agreed to proceed.  History of Present Illness: Theresa Scott is a 64 y.o. female who would like to discuss COUGH/FEVER   New onset cough, nasal and chest congestion x 1 week Cough is productive of yellow-green sputum Developed new onset fever today, Tmax 100 F She is taking Mucinex  No known sick contacts or COVID-19 exposure History of pneumonia years ago   Review of Systems  Constitutional: Positive for chills, fever (Tmax 100) and malaise/fatigue.  HENT: Positive for congestion and sore throat (PND).        + hoarseness   Respiratory: Positive for cough and sputum production. Negative for hemoptysis, shortness of breath and wheezing.   Cardiovascular: Negative for chest pain.  Gastrointestinal: Positive for nausea. Negative for abdominal pain, diarrhea and vomiting.  Musculoskeletal: Positive for myalgias. Negative for falls.  Skin: Negative for rash.  Neurological: Positive for headaches. Negative for loss of consciousness.  Psychiatric/Behavioral: Negative for hallucinations.      Observations/Objective: BP 131/76   Pulse 77   Temp 100 F (37.8 C) (Oral)   Ht _0  (1.702 m)   Wt 188 lb (85.3 kg)   BMI 29.44 kg/m  BP Readings from Last 3 Encounters:  01/02/20 (!) 167/73  11/03/19 139/84  10/30/19 131/76   Exam limited by telehealth VS reviewed Gen: alert, appears fatigued, not toxic-appearing, no acute distress Pulm: normal work of breathing, voice is hoarse  Lab and Radiology Results No results found for this or any  previous visit (from the past 72 hour(s)). No results found.     Assessment and Plan: 64 y.o. female with The primary encounter diagnosis was Febrile respiratory illness. A diagnosis of Influenza-like illness was also pertinent to this visit.  Appt today at 2:45 pm for COVID-19 testing  Given patient's hx of PNA and febrile respiratory illness, will treat empirically for CAP with Azithromycin  PDMP not reviewed this encounter. No orders of the defined types were placed in this encounter.  Meds ordered this encounter  Medications  . DISCONTD: azithromycin (ZITHROMAX Z-PAK) 250 MG tablet    Sig: Take 2 tablets (500 mg) on  Day 1,  followed by 1 tablet (250 mg) once daily on Days 2 through 5.    Dispense:  6 tablet    Refill:  0    Order Specific Question:   Supervising Provider    Answer:   Emeterio Reeve [[id number]]   There are no Patient Instructions on file for this visit.  Instructions sent via MyChart. If MyChart not available, pt was given option for info via personal e-mail w/ no guarantee of protected health info over unsecured e-mail communication, and MyChart sign-up instructions were sent to patient.   Follow Up Instructions: No follow-ups on file.    I discussed the assessment and treatment plan with the patient. The patient was provided an opportunity to ask questions and all were answered. The patient agreed with the plan and demonstrated an understanding of the instructions.   The patient was advised to call  back or seek an in-person evaluation if any new concerns, if symptoms worsen or if the condition fails to improve as anticipated.  20 minutes of non-face-to-face time was provided during this encounter.      . . . . . . . . . . . . . Marland Kitchen                   Historical information moved to improve visibility of documentation.  Past Medical History:  Diagnosis Date  . Chronic kidney disease   . Complication of anesthesia     hard to wake up and swelling after kidney stone removal in 1980 per pt  . Degenerative disc disease   . Depression   . Diabetes mellitus   . Fatty liver disease, nonalcoholic   . Gallstones   . GERD (gastroesophageal reflux disease)   . Hypercholesterolemia   . Hypertension   . Kidney stones   . Obesity   . Osteoporosis   . Pancreatitis    Past Surgical History:  Procedure Laterality Date  . BACK SURGERY  1990,2003,2004,2010  . CARDIAC CATHETERIZATION  2011   [facility].   Marland Kitchen CARDIAC SURGERY  2002   ablation  . CHOLECYSTECTOMY  1976  . COLONOSCOPY  2008  . ESOPHAGOGASTRODUODENOSCOPY N/A 01/27/2013   Procedure: ESOPHAGOGASTRODUODENOSCOPY (EGD) upper endoscopy;  Surgeon: Shann Medal, MD;  Location: [facility];  Service: General;  Laterality: N/A;  . 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 HAND SURGERY  2000   right  . TONSILLECTOMY  1971   Social History   Tobacco Use  . Smoking status: Former Smoker    Packs/day: 0.50    Years: 18.00    Pack years: 9.00    Quit date: 08/04/1990    Years since quitting: 29.5  . Smokeless tobacco: Never Used  Substance Use Topics  . Alcohol use: No   family history includes Alcohol abuse in her brother and mother; Cirrhosis in her sister; Colon cancer in her brother; Diabetes in her sister; Kidney failure in her brother; Liver cancer in her brother; Lung cancer in her mother.  Medications: Current Outpatient Medications  Medication Sig Dispense Refill  . ACCU-CHEK FASTCLIX LANCETS MISC For testing blood sugars 1-2 times daily. DX: R73.01 306 each 4  . atorvastatin (LIPITOR) 20 MG tablet Take 1 tablet (20 mg total) by mouth daily. 90 tablet 3  . baclofen (LIORESAL) 10 MG tablet     . Blood Glucose Calibration (ACCU-CHEK SMARTVIEW CONTROL) LIQD To be used to calibrate glucose meter as needed. 1 each prn  . Blood Glucose Monitoring Suppl (ACCU-CHEK NANO SMARTVIEW) w/Device KIT Check fasting blood sugars every morning and once after largest meal  of the day a few x a week. DX:R73.01 1 kit 0  . cetirizine (ZYRTEC ALLERGY) 10 MG tablet Take 1 tablet (10 mg total) by mouth daily. 30 tablet 0  . Cyanocobalamin (VITAMIN B-12 PO) Take 1 tablet by mouth every Wednesday.    Marland Kitchen desvenlafaxine (PRISTIQ) 100 MG 24 hr tablet Take 1 tablet (100 mg total) by mouth daily. 90 tablet 3  . Docusate Sodium (COLACE PO)     . EPINEPHrine 0.3 mg/0.3 mL IJ SOAJ injection Inject 0.3 mLs (0.3 mg total) into the muscle once. 2 Device prn  . meclizine (ANTIVERT) 25 MG tablet Take one tab PO BID to TID prn dizziness 12 tablet 0  . metFORMIN (GLUCOPHAGE-XR) 500 MG 24 hr tablet TAKE 1 TABLET DAILY WITH BREAKFAST. 90 tablet 3  .  ondansetron (ZOFRAN-ODT) 4 MG disintegrating tablet DISSOLVE 1 TABLET ON THE TONGUE EVERY 8 HOURS AS NEEDED FOR NAUSEA AND VOMITING 60 tablet 4  . Oxycodone HCl 10 MG TABS Take 10 mg by mouth daily.    Marland Kitchen ACCU-CHEK SMARTVIEW test strip TEST BLOOD SUGAR TWICE DAILY 200 strip 11  . Alcohol Swabs (B-D SINGLE USE SWABS REGULAR) PADS USE TOPICALLY  1  TO  2  TIMES  DAILY AS DIRECTED 200 each 4  . alendronate (FOSAMAX) 70 MG tablet Take 1 tablet (70 mg total) by mouth every 7 (seven) days. Take with a full glass of water on an empty stomach. 12 tablet 4  . gabapentin (NEURONTIN) 300 MG capsule TAKE 2 CAPSULES EVERY DAY 180 capsule 1  . metoprolol succinate (TOPROL-XL) 25 MG 24 hr tablet TAKE 1 TABLET EVERY DAY 90 tablet 1  . omeprazole (PRILOSEC) 40 MG capsule TAKE 1 CAPSULE EVERY DAY 90 capsule 3   No current facility-administered medications for this visit.   Allergies  Allergen Reactions  . Bee Venom Anaphylaxis  . Loratadine Itching    REACTION: dizziness  . Nsaids Other (See Comments)    GI intolerance.    . Latex Rash

## 2019-10-30 NOTE — Telephone Encounter (Signed)
Theresa Scott called back. She has been scheduled for a virtual visit.

## 2019-10-30 NOTE — Progress Notes (Signed)
1 week history:  Started with sore throat/headache/productive cough/congestion  Started 2 days ago:  Body aches/chills, nausea (no vomiting) Still having above symptoms as well Small change in taste/smell  No diarrhea, no sick contacts that she is aware of, has not been tested for Covid

## 2019-11-01 LAB — NOVEL CORONAVIRUS, NAA: SARS-CoV-2, NAA: DETECTED — AB

## 2019-11-03 ENCOUNTER — Ambulatory Visit (INDEPENDENT_AMBULATORY_CARE_PROVIDER_SITE_OTHER): Payer: Medicare HMO | Admitting: Family Medicine

## 2019-11-03 ENCOUNTER — Other Ambulatory Visit: Payer: Medicare HMO

## 2019-11-03 ENCOUNTER — Encounter: Payer: Self-pay | Admitting: Family Medicine

## 2019-11-03 VITALS — BP 139/84 | HR 74 | Temp 99.5°F

## 2019-11-03 DIAGNOSIS — I700 Atherosclerosis of aorta: Secondary | ICD-10-CM

## 2019-11-03 DIAGNOSIS — U071 COVID-19: Secondary | ICD-10-CM | POA: Diagnosis not present

## 2019-11-03 DIAGNOSIS — F322 Major depressive disorder, single episode, severe without psychotic features: Secondary | ICD-10-CM

## 2019-11-03 NOTE — Progress Notes (Addendum)
Virtual Visit via Video Note  I connected with Theresa Scott on 11/28/19 at  9:10 AM EST by a video enabled telemedicine application and verified that I am speaking with the correct person using two identifiers.   I discussed the limitations of evaluation and management by telemedicine and the availability of in person appointments. The patient expressed understanding and agreed to proceed.  Subjective:    CC: Positive COVID-019   HPI:  Pt stated that she began having sxs 1 week ago Sunday. She thought that it was her sinuses and began taking Claritin for this however, her sxs did not go away. She was tested last week for COVID and her test came back POSITIVE. Taking IBU for fever.    She reports that she is beginning to feel better now. She still has been running low grade fevers on/off. The highest one was 101. She is 99.5 today and has been taking Motrin. Her taste is slowly coming back along with her appetite.   She questions if her husband and son Mateo Flow and Jeneen Rinks) should be tested.  She informed me that [place] had a fever yesterday but not today.     Past medical history, Surgical history, Family history not pertinant except as noted below, Social history, Allergies, and medications have been entered into the medical record, reviewed, and corrections made.   Review of Systems: No fevers, chills, night sweats, weight loss, chest pain, or shortness of breath.   Objective:    General: Speaking clearly in complete sentences without any shortness of breath.  Alert and oriented x3.  Normal judgment. No apparent acute distress. Appears pale.      Impression and Recommendations:   COVID - 19  -we discussed a minimum of 10-day quarantine.  And that if she is improving and has been fever free for at least 48 hours.  It sounds like she still having some low-grade temperatures.  She still has 1 more time of antibiotic that was given prophylactically before her test results came back.   Just encouraged her to go ahead and finish that off.  Make sure staying hydrated okay to use Aleve or ibuprofen or Tylenol as needed for fever and pain relief.  Her adopted son is staying with a family member.  He is an infant and so wanted to know when it would be okay for him to return.  Again just reminded her it needs to be 10 days from the day that she was tested.   In regards to her husband he is actually 23 and has a risk of complication score of 6 which is quite significant and he would be a candidate for remdesivir potentially.  Discussed that it may be worth getting a consult today even though he was feeling a little bit better today to get tested.  Because if he does get worse then he may be a great candidate.  Husband evidently is also out of the house helping someone today even though he had a fever yesterday.   Time spent 20 min in encounter.   I discussed the assessment and treatment plan with the patient. The patient was provided an opportunity to ask questions and all were answered. The patient agreed with the plan and demonstrated an understanding of the instructions.   The patient was advised to call back or seek an in-person evaluation if the symptoms worsen or if the condition fails to improve as anticipated.   Beatrice Lecher, MD

## 2019-11-03 NOTE — Progress Notes (Signed)
Pt stated that she began having sxs 1 week ago Sunday. She thought that it was her sinuses and began taking Claritin for this however, her sxs did not go away. She was tested last week for COVID and her test came back POSITIVE.  She reports that she is beginning to feel better now. She still has been running low grade fevers on/off. The highest one was 101. She is 99.5 today and has been taking Motrin. Her taste is slowly coming back along with her appetite.   She questions if her husband and son Mateo Flow and Jeneen Rinks) should be tested.  She informed me that [place] had a fever yesterday but not today.

## 2019-11-13 ENCOUNTER — Telehealth: Payer: Self-pay

## 2019-11-13 NOTE — Telephone Encounter (Signed)
Yes, okay for both of them to go this weekend as long as again they are feeling better and have been fever free for at least 2 days.

## 2019-11-13 NOTE — Telephone Encounter (Signed)
Theresa Scott and Theresa Scott would like to know if they are cleared to go to the hospital this Saturday to visit their adopted daughter. She reports symptom free and/or improvement of symptoms.

## 2019-11-14 NOTE — Telephone Encounter (Signed)
Left message advising of recommendations.  

## 2019-11-17 ENCOUNTER — Other Ambulatory Visit: Payer: Self-pay | Admitting: Family Medicine

## 2019-11-17 DIAGNOSIS — R7301 Impaired fasting glucose: Secondary | ICD-10-CM

## 2019-12-04 DIAGNOSIS — M5116 Intervertebral disc disorders with radiculopathy, lumbar region: Secondary | ICD-10-CM | POA: Diagnosis not present

## 2019-12-04 DIAGNOSIS — M47892 Other spondylosis, cervical region: Secondary | ICD-10-CM | POA: Diagnosis not present

## 2019-12-04 DIAGNOSIS — G894 Chronic pain syndrome: Secondary | ICD-10-CM | POA: Diagnosis not present

## 2019-12-04 DIAGNOSIS — M545 Low back pain: Secondary | ICD-10-CM | POA: Diagnosis not present

## 2019-12-04 DIAGNOSIS — M5106 Intervertebral disc disorders with myelopathy, lumbar region: Secondary | ICD-10-CM | POA: Diagnosis not present

## 2019-12-09 ENCOUNTER — Telehealth: Payer: Self-pay

## 2019-12-09 NOTE — Telephone Encounter (Signed)
[employer] has shipped Pristiq 100 mg #90 to our office. [employer]. Patient is aware and will stop by to pick up.   Lot: [id number] Exp: 03/22/2022

## 2019-12-10 NOTE — Telephone Encounter (Signed)
Information sent.Theresa Scott, [place]

## 2020-01-02 ENCOUNTER — Ambulatory Visit (INDEPENDENT_AMBULATORY_CARE_PROVIDER_SITE_OTHER): Payer: Medicare HMO | Admitting: Family Medicine

## 2020-01-02 ENCOUNTER — Other Ambulatory Visit: Payer: Self-pay

## 2020-01-02 ENCOUNTER — Encounter: Payer: Self-pay | Admitting: Family Medicine

## 2020-01-02 VITALS — BP 167/73 | HR 63 | Temp 97.6°F | Wt 188.0 lb

## 2020-01-02 DIAGNOSIS — N3090 Cystitis, unspecified without hematuria: Secondary | ICD-10-CM

## 2020-01-02 DIAGNOSIS — R300 Dysuria: Secondary | ICD-10-CM | POA: Diagnosis not present

## 2020-01-02 LAB — POCT URINALYSIS DIP (CLINITEK)
Bilirubin, UA: NEGATIVE
Glucose, UA: NEGATIVE mg/dL
Ketones, POC UA: NEGATIVE mg/dL
Nitrite, UA: NEGATIVE
POC PROTEIN,UA: 30 — AB
Spec Grav, UA: >=1.03 — AB (ref 1.010–1.025)
Urobilinogen, UA: 4 E.U./dL — AB
pH, UA: 6.5 (ref 5.0–8.0)

## 2020-01-02 MED ORDER — CEPHALEXIN 500 MG PO CAPS: 500.0000 mg | ORAL_CAPSULE | Freq: Two times a day (BID) | ORAL | 0 refills | Status: AC

## 2020-01-02 NOTE — Progress Notes (Signed)
Theresa Scott - 64 y.o. female MRN [id number]  Date of birth: 1956-05-30  Subjective Chief Complaint  Patient presents with  . Dysuria    HPI  Theresa Scott is a 64 y.o. female who complains of urinary frequency, urgency and dysuria x 3 days.  She denies flank pain, fever, chills, or abnormal vaginal discharge or bleeding.  She has not tried anything to help with symptoms.   ROS:  A comprehensive ROS was completed and negative except as noted per HPI    Allergies  Allergen Reactions  . Bee Venom Anaphylaxis  . Loratadine Itching    REACTION: dizziness  . Nsaids Other (See Comments)    GI intolerance.    . Latex Rash    Past Medical History:  Diagnosis Date  . Chronic kidney disease   . Complication of anesthesia    hard to wake up and swelling after kidney stone removal in 1980 per pt  . Degenerative disc disease   . Depression   . Diabetes mellitus   . Fatty liver disease, nonalcoholic   . Gallstones   . GERD (gastroesophageal reflux disease)   . Hypercholesterolemia   . Hypertension   . Kidney stones   . Obesity   . Osteoporosis   . Pancreatitis     Past Surgical History:  Procedure Laterality Date  . BACK SURGERY  1990,2003,2004,2010  . CARDIAC CATHETERIZATION  2011   [facility].   Marland Kitchen CARDIAC SURGERY  2002   ablation  . CHOLECYSTECTOMY  1976  . COLONOSCOPY  2008  . ESOPHAGOGASTRODUODENOSCOPY N/A 01/27/2013   Procedure: ESOPHAGOGASTRODUODENOSCOPY (EGD) upper endoscopy;  Surgeon: Shann Medal, MD;  Location: [facility];  Service: General;  Laterality: N/A;  . 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 HAND SURGERY  2000   right  . TONSILLECTOMY  1971    Social History   Socioeconomic History  . Marital status: Married    Spouse name: Mateo Flow  . Number of children: 3  . Years of education: 69  . Highest education level: GED or equivalent  Occupational History  . Occupation: [employer]    Comment: retired  Tobacco Use  . Smoking status: Former  Smoker    Packs/day: 0.50    Years: 18.00    Pack years: 9.00    Quit date: 08/04/1990    Years since quitting: 29.4  . Smokeless tobacco: Never Used  Substance and Sexual Activity  . Alcohol use: No  . Drug use: No  . Sexual activity: Yes  Other Topics Concern  . Not on file  Social History Narrative   Married 1 son one daughter   Disabled, chronic back pain, former [employer] her.   No alcohol or caffeine tobacco   06/27/2016   Last update   Adopted 2 children   Social Determinants of Health   Financial Resource Strain: Low Risk   . Difficulty of Paying Living Expenses: Not hard at all  Food Insecurity: No Food Insecurity  . Worried About Charity fundraiser in the Last Year: Never true  . Ran Out of Food in the Last Year: Never true  Transportation Needs: No Transportation Needs  . Lack of Transportation (Medical): No  . Lack of Transportation (Non-Medical): No  Physical Activity: Insufficiently Active  . Days of Exercise per Week: 2 days  . Minutes of Exercise per Session: 20 min  Stress: No Stress Concern Present  . Feeling of Stress : Not at all  Social  Connections: Slightly Isolated  . Frequency of Communication with Friends and Family: More than three times a week  . Frequency of Social Gatherings with Friends and Family: Twice a week  . Attends Religious Services: More than 4 times per year  . Active Member of Clubs or Organizations: No  . Attends Archivist Meetings: Never  . Marital Status: Married    Family History  Problem Relation Age of Onset  . Lung cancer Mother   . Alcohol abuse Mother   . Colon cancer Brother        late 63's  . Liver cancer Brother   . Alcohol abuse Brother   . Cirrhosis Sister        secondary medication  . Diabetes Sister   . Kidney failure Brother        Dialysis at 44, born single kidney  . Esophageal cancer Neg Hx   . Stomach cancer Neg Hx   . Rectal cancer Neg Hx     Health  Maintenance  Topic Date Due  . OPHTHALMOLOGY EXAM  07/16/2019  . FOOT EXAM  11/28/2019  . URINE MICROALBUMIN  11/28/2019  . HEMOGLOBIN A1C  12/06/2019  . PAP SMEAR-Modifier  12/16/2020  . MAMMOGRAM  07/08/2021  . DEXA SCAN  07/08/2021  . TETANUS/TDAP  11/18/2025  . COLONOSCOPY  12/21/2027  . INFLUENZA VACCINE  Completed  . PNEUMOCOCCAL POLYSACCHARIDE VACCINE AGE 35-64 HIGH RISK  Completed  . Hepatitis C Screening  Completed  . HIV Screening  Completed     ----------------------------------------------------------------------------------------------------------------------------------------------------------------------------------------------------------------- Physical Exam BP (!) 167/73 Comment: Patietn declined  Pulse 63   Temp 97.6 F (36.4 C) (Oral)   Wt 188 lb (85.3 kg)   BMI 29.44 kg/m   Physical Exam Constitutional:      Appearance: Normal appearance.  HENT:     Head: Normocephalic and atraumatic.  Eyes:     General: No scleral icterus. Cardiovascular:     Rate and Rhythm: Normal rate and regular rhythm.  Pulmonary:     Effort: Pulmonary effort is normal.     Breath sounds: Normal breath sounds.  Abdominal:     General: There is no distension.     Tenderness: There is no abdominal tenderness. There is no right CVA tenderness or left CVA tenderness.  Neurological:     General: No focal deficit present.     Mental Status: She is alert.  Psychiatric:        Mood and Affect: Mood normal.        Behavior: Behavior normal.     ------------------------------------------------------------------------------------------------------------------------------------------------------------------------------------------------------------------- Assessment and Plan  Cystitis Symptoms consistent with UTI with UA showing blood and leukocytes.  Will go ahead and cover with rx for cephalexin.  Urine sent for culture. Will call if antibiotics need to be adjusted.  F/u for  new or worsening symptoms.    Meds ordered this encounter  Medications  . cephALEXin (KEFLEX) 500 MG capsule    Sig: Take 1 capsule (500 mg total) by mouth 2 (two) times daily for 7 days.    Dispense:  14 capsule    Refill:  0    No follow-ups on file.    This visit occurred during the SARS-CoV-2 public health emergency.  Safety protocols were in place, including screening questions prior to the visit, additional usage of staff PPE, and extensive cleaning of exam room while observing appropriate contact time as indicated for disinfecting solutions.

## 2020-01-02 NOTE — Patient Instructions (Signed)
Urinary Tract Infection, Adult A urinary tract infection (UTI) is an infection of any part of the urinary tract. The urinary tract includes:  The kidneys.  The ureters.  The bladder.  The urethra. These organs make, store, and get rid of pee (urine) in the body. What are the causes? This is caused by germs (bacteria) in your genital area. These germs grow and cause swelling (inflammation) of your urinary tract. What increases the risk? You are more likely to develop this condition if:  You have a small, thin tube (catheter) to drain pee.  You cannot control when you pee or poop (incontinence).  You are female, and: ? You use these methods to prevent pregnancy:  A medicine that kills sperm (spermicide).  A device that blocks sperm (diaphragm). ? You have low levels of a female hormone (estrogen). ? You are pregnant.  You have genes that add to your risk.  You are sexually active.  You take antibiotic medicines.  You have trouble peeing because of: ? A prostate that is bigger than normal, if you are female. ? A blockage in the part of your body that drains pee from the bladder (urethra). ? A kidney stone. ? A nerve condition that affects your bladder (neurogenic bladder). ? Not getting enough to drink. ? Not peeing often enough.  You have other conditions, such as: ? Diabetes. ? A weak disease-fighting system (immune system). ? Sickle cell disease. ? Gout. ? Injury of the spine. What are the signs or symptoms? Symptoms of this condition include:  Needing to pee right away (urgently).  Peeing often.  Peeing small amounts often.  Pain or burning when peeing.  Blood in the pee.  Pee that smells bad or not like normal.  Trouble peeing.  Pee that is cloudy.  Fluid coming from the vagina, if you are female.  Pain in the belly or lower back. Other symptoms include:  Throwing up (vomiting).  No urge to eat.  Feeling mixed up (confused).  Being tired  and grouchy (irritable).  A fever.  Watery poop (diarrhea). How is this treated? This condition may be treated with:  Antibiotic medicine.  Other medicines.  Drinking enough water. Follow these instructions at home:  Medicines  Take over-the-counter and prescription medicines only as told by your doctor.  If you were prescribed an antibiotic medicine, take it as told by your doctor. Do not stop taking it even if you start to feel better. General instructions  Make sure you: ? Pee until your bladder is empty. ? Do not hold pee for a long time. ? Empty your bladder after sex. ? Wipe from front to back after pooping if you are a female. Use each tissue one time when you wipe.  Drink enough fluid to keep your pee pale yellow.  Keep all follow-up visits as told by your doctor. This is important. Contact a doctor if:  You do not get better after 1-2 days.  Your symptoms go away and then come back. Get help right away if:  You have very bad back pain.  You have very bad pain in your lower belly.  You have a fever.  You are sick to your stomach (nauseous).  You are throwing up. Summary  A urinary tract infection (UTI) is an infection of any part of the urinary tract.  This condition is caused by germs in your genital area.  There are many risk factors for a UTI. These include having a small, thin   tube to drain pee and not being able to control when you pee or poop.  Treatment includes antibiotic medicines for germs.  Drink enough fluid to keep your pee pale yellow. This information is not intended to replace advice given to you by your health care provider. Make sure you discuss any questions you have with your health care provider. Document Revised: 09/26/2018 Document Reviewed: 04/18/2018 Elsevier Patient Education  2020 Elsevier Inc.  

## 2020-01-02 NOTE — Assessment & Plan Note (Signed)
Symptoms consistent with UTI with UA showing blood and leukocytes.  Will go ahead and cover with rx for cephalexin.  Urine sent for culture. Will call if antibiotics need to be adjusted.  F/u for new or worsening symptoms.

## 2020-01-04 LAB — URINE CULTURE
MICRO NUMBER:: 10246231
SPECIMEN QUALITY:: ADEQUATE

## 2020-01-06 DIAGNOSIS — M542 Cervicalgia: Secondary | ICD-10-CM | POA: Diagnosis not present

## 2020-01-06 DIAGNOSIS — M47892 Other spondylosis, cervical region: Secondary | ICD-10-CM | POA: Diagnosis not present

## 2020-01-06 DIAGNOSIS — M5116 Intervertebral disc disorders with radiculopathy, lumbar region: Secondary | ICD-10-CM | POA: Diagnosis not present

## 2020-01-06 DIAGNOSIS — G894 Chronic pain syndrome: Secondary | ICD-10-CM | POA: Diagnosis not present

## 2020-02-12 ENCOUNTER — Telehealth (INDEPENDENT_AMBULATORY_CARE_PROVIDER_SITE_OTHER): Payer: Medicare HMO | Admitting: Medical-Surgical

## 2020-02-12 ENCOUNTER — Encounter: Payer: Self-pay | Admitting: Medical-Surgical

## 2020-02-12 VITALS — BP 132/82 | HR 73 | Temp 98.3°F

## 2020-02-12 DIAGNOSIS — B349 Viral infection, unspecified: Secondary | ICD-10-CM

## 2020-02-12 NOTE — Progress Notes (Signed)
Virtual Visit via Video Note  I connected with Theresa Scott on 02/12/20 at  1:00 PM EDT by a video enabled telemedicine application and verified that I am speaking with the correct person using two identifiers.   I discussed the limitations of evaluation and management by telemedicine and the availability of in person appointments. The patient expressed understanding and agreed to proceed.  Subjective:    CC: fever, body aches  HPI: Pleasant 64 year old female presenting with reports of weakness, fatigue, fever t-max 100.8, decreased appetite, headache, mild intermittent dizziness, body aches, chills, fatigue, mild productive cough of white sputum, rhinorrhea with green-yellow nasal discharge since yesterday morning on awakening. No fever today but has been taking Ibuprofen regularly since yesterday morning.Tested positive for COVID in January. Denies sore throat, nausea, vomiting, and diarrhea. Admits that her 43 year old son has been sick but reports he stays in his room most of the time and she has had minimal contact with him.  Past medical history, Surgical history, Family history not pertinant except as noted below, Social history, Allergies, and medications have been entered into the medical record, reviewed, and corrections made.   Review of Systems: No night sweats, weight loss, chest pain, or shortness of breath.   Objective:    General: Speaking clearly in complete sentences without any shortness of breath.  Alert and oriented x3.  Normal judgment. No apparent acute distress.  Impression and Recommendations:    1. Viral syndrome Abrupt onset and severity of symptoms indicating viral etiology. Had [place] in January but symptoms very like when she first tested positive. Recommend COVID Testing if desired, although this would be a quick turnaround for reinfection, there are different strains and . Symptomatic treatment with Tylenol/Ibuprofen or OTC cold and flu preparations.  Encouraged increased rest and PO fluids. If not any better by Monday, please contact the office for reevaluation.   I discussed the assessment and treatment plan with the patient. The patient was provided an opportunity to ask questions and all were answered. The patient agreed with the plan and demonstrated an understanding of the instructions.   The patient was advised to call back or seek an in-person evaluation if the symptoms worsen or if the condition fails to improve as anticipated.  Return if symptoms worsen or fail to improve.  20 minutes of non-face-to-face time was provided during this encounter.  Clearnce Sorrel, DNP, APRN, FNP-BC [facility]

## 2020-02-16 ENCOUNTER — Other Ambulatory Visit: Payer: Self-pay | Admitting: Family Medicine

## 2020-03-02 DIAGNOSIS — M47892 Other spondylosis, cervical region: Secondary | ICD-10-CM | POA: Diagnosis not present

## 2020-03-02 DIAGNOSIS — M542 Cervicalgia: Secondary | ICD-10-CM | POA: Diagnosis not present

## 2020-03-02 DIAGNOSIS — M5116 Intervertebral disc disorders with radiculopathy, lumbar region: Secondary | ICD-10-CM | POA: Diagnosis not present

## 2020-03-02 DIAGNOSIS — G894 Chronic pain syndrome: Secondary | ICD-10-CM | POA: Diagnosis not present

## 2020-03-09 DIAGNOSIS — H43392 Other vitreous opacities, left eye: Secondary | ICD-10-CM | POA: Diagnosis not present

## 2020-03-09 DIAGNOSIS — H43821 Vitreomacular adhesion, right eye: Secondary | ICD-10-CM | POA: Diagnosis not present

## 2020-03-09 LAB — HM DIABETES EYE EXAM

## 2020-03-11 ENCOUNTER — Telehealth (INDEPENDENT_AMBULATORY_CARE_PROVIDER_SITE_OTHER): Payer: Medicare HMO | Admitting: Family Medicine

## 2020-03-11 ENCOUNTER — Encounter: Payer: Self-pay | Admitting: Family Medicine

## 2020-03-11 VITALS — BP 131/67 | HR 66 | Ht 67.0 in | Wt 190.0 lb

## 2020-03-11 DIAGNOSIS — R7301 Impaired fasting glucose: Secondary | ICD-10-CM | POA: Diagnosis not present

## 2020-03-11 DIAGNOSIS — R21 Rash and other nonspecific skin eruption: Secondary | ICD-10-CM | POA: Diagnosis not present

## 2020-03-11 NOTE — Progress Notes (Signed)
Pt reports that she woke up this morning w/a rash bilaterally on her lower legs. She described the areas as "patches" and that they are red and burning and the burn is constant like your hand has been stuck in fire. She said that 2 days ago she experienced some leg pain.   She denies any CP/SOB or swelling. No f/s/c/n/v/d. She did inform me that last weekend she had a stomach virus.

## 2020-03-11 NOTE — Assessment & Plan Note (Signed)
Due to recheck A1c. 

## 2020-03-11 NOTE — Progress Notes (Signed)
Virtual Visit via Video Note  I connected with Theresa Scott on 03/11/20 at 10:10 AM EDT by a video enabled telemedicine application and verified that I am speaking with the correct person using two identifiers.   I discussed the limitations of evaluation and management by telemedicine and the availability of in person appointments. The patient expressed understanding and agreed to proceed.  Subjective:    CC: Rash  HPI: Pt reports that she woke up this morning w/a rash bilaterally on her lower legs. She described the areas as "patches" and that they are red and burning and the burn is constant like your hand has been stuck in fire.  She reports that the 2 days prior she was actually getting a burning sensation in her feet and lower legs to just about the mid tibial area.  She said she did not think much of it until she woke up with a rash this morning.  The rash is actually over a similar distribution but not quite as high as where the burning ended.  It is bilateral.  She denies any new soaps, detergents lotions etc.  She denies any urinary symptoms or chest pain or shortness of breath.  She did note that over the weekend she actually had gastroenteritis she had vomiting and diarrhea that lasted about 2 days she also ran a low-grade temperature but says she has been fine in that regard for the last 3 to 4 days.  She denies any CP/SOB or swelling. No f/s/c/n/v/d. She did inform me that last weekend she had a stomach virus.    Past medical history, Surgical history, Family history not pertinant except as noted below, Social history, Allergies, and medications have been entered into the medical record, reviewed, and corrections made.   Review of Systems: No fevers, chills, night sweats, weight loss, chest pain, or shortness of breath.   Objective:    General: Speaking clearly in complete sentences without any shortness of breath.  Alert and oriented x3.  Normal judgment. No apparent acute  distress. She did have a little of most erythematous blotches on the skin that I was able to see over the video camera.  She also had some darkening veins and some varicose veins.  I did not see any significant swelling.  It was mostly from slightly below mid tibia down.    Impression and Recommendations:    IMPAIRED FASTING GLUCOSE Due to recheck A1c.  Rash-unclear etiology she says completely flat so it is macular.  It does not sound like it is an allergic type of reaction.  No itching or hives-like appearance.  Unclear what this could be so we will start by checking a CBC, thyroid etc.  We will also check renal function etc. she is really not had labs since August of last year.  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  Encouraged her to give [place] a call if she feels like it spreading or getting worse or becoming more raised or painful.  She notices any blisters.  She says she can come in if needed but this morning she could not find a babysitter and so opted to to do a virtual.    Time spent in encounter 20 minutes  I discussed the assessment and treatment plan with the patient. The patient was provided an opportunity to ask questions and all were answered. The patient  agreed with the plan and demonstrated an understanding of the instructions.   The patient was advised to call back or seek an in-person evaluation if the symptoms worsen or if the condition fails to improve as anticipated.   Beatrice Lecher, MD

## 2020-03-12 ENCOUNTER — Other Ambulatory Visit: Payer: Self-pay | Admitting: Neurology

## 2020-03-12 DIAGNOSIS — R7989 Other specified abnormal findings of blood chemistry: Secondary | ICD-10-CM

## 2020-03-12 DIAGNOSIS — Z13220 Encounter for screening for lipoid disorders: Secondary | ICD-10-CM

## 2020-03-12 LAB — COMPLETE METABOLIC PANEL WITH GFR
AG Ratio: 1.5 (calc) (ref 1.0–2.5)
ALT: 23 U/L (ref 6–29)
AST: 30 U/L (ref 10–35)
Albumin: 3.8 g/dL (ref 3.6–5.1)
Alkaline phosphatase (APISO): 70 U/L (ref 37–153)
BUN: 12 mg/dL (ref 7–25)
CO2: 32 mmol/L (ref 20–32)
Calcium: 9.1 mg/dL (ref 8.6–10.4)
Chloride: 106 mmol/L (ref 98–110)
Creat: 0.74 mg/dL (ref 0.50–0.99)
GFR, Est African American: 100 mL/min/{1.73_m2} (ref >=60)
GFR, Est Non African American: 86 mL/min/{1.73_m2} (ref >=60)
Globulin: 2.6 g/dL (calc) (ref 1.9–3.7)
Glucose, Bld: 150 mg/dL — ABNORMAL HIGH (ref 65–139)
Potassium: 3.8 mmol/L (ref 3.5–5.3)
Sodium: 143 mmol/L (ref 135–146)
Total Bilirubin: 0.8 mg/dL (ref 0.2–1.2)
Total Protein: 6.4 g/dL (ref 6.1–8.1)

## 2020-03-12 LAB — CBC WITH DIFFERENTIAL/PLATELET
Absolute Monocytes: 491 cells/uL (ref 200–950)
Basophils Absolute: 13 cells/uL (ref 0–200)
Basophils Relative: 0.2 %
Eosinophils Absolute: 50 cells/uL (ref 15–500)
Eosinophils Relative: 0.8 %
HCT: 36.9 % (ref 35.0–45.0)
Hemoglobin: 12 g/dL (ref 11.7–15.5)
Lymphs Abs: 1235 cells/uL (ref 850–3900)
MCH: 29.2 pg (ref 27.0–33.0)
MCHC: 32.5 g/dL (ref 32.0–36.0)
MCV: 89.8 fL (ref 80.0–100.0)
MPV: 13 fL — ABNORMAL HIGH (ref 7.5–12.5)
Monocytes Relative: 7.8 %
Neutro Abs: 4511 cells/uL (ref 1500–7800)
Neutrophils Relative %: 71.6 %
Platelets: 126 10*3/uL — ABNORMAL LOW (ref 140–400)
RBC: 4.11 10*6/uL (ref 3.80–5.10)
RDW: 12.4 % (ref 11.0–15.0)
Total Lymphocyte: 19.6 %
WBC: 6.3 10*3/uL (ref 3.8–10.8)

## 2020-03-12 LAB — HEMOGLOBIN A1C
Hgb A1c MFr Bld: 6.1 % of total Hgb — ABNORMAL HIGH (ref <5.7)
Mean Plasma Glucose: 128 (calc)
eAG (mmol/L): 7.1 (calc)

## 2020-03-12 LAB — TSH: TSH: 1.03 mIU/L (ref 0.40–4.50)

## 2020-03-12 LAB — SEDIMENTATION RATE: Sed Rate: 6 mm/h (ref 0–30)

## 2020-03-15 DIAGNOSIS — R7989 Other specified abnormal findings of blood chemistry: Secondary | ICD-10-CM | POA: Diagnosis not present

## 2020-03-15 DIAGNOSIS — Z13220 Encounter for screening for lipoid disorders: Secondary | ICD-10-CM | POA: Diagnosis not present

## 2020-03-15 LAB — CBC WITH DIFFERENTIAL/PLATELET
Absolute Monocytes: 504 cells/uL (ref 200–950)
Basophils Absolute: 29 cells/uL (ref 0–200)
Basophils Relative: 0.6 %
Eosinophils Absolute: 82 cells/uL (ref 15–500)
Eosinophils Relative: 1.7 %
HCT: 38 % (ref 35.0–45.0)
Hemoglobin: 12.3 g/dL (ref 11.7–15.5)
Lymphs Abs: 1426 cells/uL (ref 850–3900)
MCH: 29.1 pg (ref 27.0–33.0)
MCHC: 32.4 g/dL (ref 32.0–36.0)
MCV: 89.8 fL (ref 80.0–100.0)
MPV: 12.5 fL (ref 7.5–12.5)
Monocytes Relative: 10.5 %
Neutro Abs: 2760 cells/uL (ref 1500–7800)
Neutrophils Relative %: 57.5 %
Platelets: 153 10*3/uL (ref 140–400)
RBC: 4.23 10*6/uL (ref 3.80–5.10)
RDW: 12.6 % (ref 11.0–15.0)
Total Lymphocyte: 29.7 %
WBC: 4.8 10*3/uL (ref 3.8–10.8)

## 2020-03-15 LAB — LIPID PANEL
Cholesterol: 57 mg/dL (ref <200)
HDL: 24 mg/dL — ABNORMAL LOW (ref >=50)
LDL Cholesterol (Calc): 20 mg/dL (calc)
Non-HDL Cholesterol (Calc): 33 mg/dL (calc) (ref <130)
Total CHOL/HDL Ratio: 2.4 (calc) (ref <5.0)
Triglycerides: 52 mg/dL (ref <150)

## 2020-03-16 ENCOUNTER — Encounter: Payer: Self-pay | Admitting: Family Medicine

## 2020-04-12 DIAGNOSIS — H43392 Other vitreous opacities, left eye: Secondary | ICD-10-CM | POA: Diagnosis not present

## 2020-04-12 DIAGNOSIS — H43812 Vitreous degeneration, left eye: Secondary | ICD-10-CM | POA: Diagnosis not present

## 2020-04-12 DIAGNOSIS — H43821 Vitreomacular adhesion, right eye: Secondary | ICD-10-CM | POA: Diagnosis not present

## 2020-04-17 ENCOUNTER — Ambulatory Visit
Admission: EM | Admit: 2020-04-17 | Discharge: 2020-04-17 | Disposition: A | Discharge status: Home / Self Care | Payer: Medicare HMO | Attending: Physician Assistant | Admitting: Physician Assistant

## 2020-04-17 DIAGNOSIS — J3489 Other specified disorders of nose and nasal sinuses: Secondary | ICD-10-CM | POA: Diagnosis not present

## 2020-04-17 DIAGNOSIS — R05 Cough: Secondary | ICD-10-CM | POA: Diagnosis not present

## 2020-04-17 DIAGNOSIS — R059 Cough, unspecified: Secondary | ICD-10-CM

## 2020-04-17 MED ORDER — BENZONATATE 200 MG PO CAPS
200.0000 mg | ORAL_CAPSULE | Freq: Three times a day (TID) | ORAL | 0 refills | Status: DC
Start: 2020-04-17 — End: 2020-05-31

## 2020-04-17 MED ORDER — FLUTICASONE PROPIONATE 50 MCG/ACT NA SUSP
2.0000 | Freq: Every day | NASAL | 0 refills | Status: DC
Start: 2020-04-17 — End: 2021-12-19

## 2020-04-17 NOTE — ED Triage Notes (Addendum)
Patient presents with a productive cough and sinus congestion with yellow drainage for 2 days.

## 2020-04-17 NOTE — ED Provider Notes (Signed)
[facility]    CSN: [id number] Arrival date & time: 04/17/20  1232      History   Chief Complaint Chief Complaint  Patient presents with  . Cough  . Nasal Congestion    HPI Theresa Scott is a 64 y.o. female.   64 year old female comes in for 2 day of URI symptoms. Productive cough, sinus congestion, rhinorrhea, nasal congestion. Denies fever, chills, body aches. Denies abdominal pain, nausea, vomiting, diarrhea. Denies shortness of breath, loss of taste/smell. +COVID 10/2019. Former smoker. Has needed inhalers in the past for bronchitis, nothing recently.   Last a1c 6.     Past Medical History:  Diagnosis Date  . Chronic kidney disease   . Complication of anesthesia    hard to wake up and swelling after kidney stone removal in 1980 per pt  . Degenerative disc disease   . Depression   . Diabetes mellitus   . Fatty liver disease, nonalcoholic   . Gallstones   . GERD (gastroesophageal reflux disease)   . Hypercholesterolemia   . Hypertension   . Kidney stones   . Obesity   . Osteoporosis   . Pancreatitis     Patient Active Problem List   Diagnosis Date Noted  . Cystitis 01/02/2020  . Major depressive disorder, single episode, severe without psychotic features ([place]) 11/03/2019  . Osteoporosis 06/05/2019  . Postauricular adenopathy 08/06/2018  . NAFLD (nonalcoholic fatty liver disease) 12/04/2016  . 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 Aortic calcification ([place]) 04/20/2016  . AVNRT (AV nodal re-entry tachycardia) ([place]) 08/18/2013  . Daytime somnolence 06/26/2013  . Sleep disorder, circadian 06/26/2013  . History of gastric bypass, 08/22/2010. 11/03/2011  . IMPAIRED FASTING GLUCOSE 12/15/2010  . ANXIETY 01/31/2010  . PSVT 12/01/2009  . STASIS DERMATITIS 04/06/2009  . ALLERGIC RHINITIS 08/19/2008  . Hyperlipemia 06/18/2008  . OBESITY 06/18/2008  . Major depression, chronic 06/18/2008  . Peripheral vascular disease, unspecified ([place])  06/18/2008  . GERD 06/18/2008  . OSTEOARTHRITIS 06/18/2008  . INSOMNIA 06/18/2008  . INCONTINENCE 06/18/2008  . PROTEINURIA 06/18/2008  . NEPHROLITHIASIS, HX OF 06/18/2008    Past Surgical History:  Procedure Laterality Date  . BACK SURGERY  1990,2003,2004,2010  . CARDIAC CATHETERIZATION  2011   [facility].   Marland Kitchen CARDIAC SURGERY  2002   ablation  . CHOLECYSTECTOMY  1976  . COLONOSCOPY  2008  . ESOPHAGOGASTRODUODENOSCOPY N/A 01/27/2013   Procedure: ESOPHAGOGASTRODUODENOSCOPY (EGD) upper endoscopy;  Surgeon: Shann Medal, MD;  Location: [facility];  Service: General;  Laterality: N/A;  . 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 HAND SURGERY  2000   right  . TONSILLECTOMY  1971    OB History   No obstetric history on file.      Home Medications    Prior to Admission medications   Medication Sig Start Date End Date Taking? Authorizing Provider  ACCU-CHEK FASTCLIX LANCETS MISC For testing blood sugars 1-2 times daily. DX: R73.01 04/11/18   Hali Marry, MD  ACCU-CHEK SMARTVIEW test strip TEST BLOOD SUGAR TWICE DAILY 11/18/19   Hali Marry, MD  Alcohol Swabs (B-D SINGLE USE SWABS REGULAR) PADS USE TOPICALLY  1  TO  2  TIMES  DAILY AS DIRECTED 11/18/19   Hali Marry, MD  alendronate (FOSAMAX) 70 MG tablet Take 1 tablet (70 mg total) by mouth every 7 (seven) days. Take with a full glass of water on an empty stomach. 06/16/19   Hali Marry, MD  atorvastatin (LIPITOR) 20 MG tablet TAKE 1 TABLET EVERY DAY 02/17/20   Hali Marry, MD  baclofen (LIORESAL) 10 MG tablet  10/05/15   [provider]  benzonatate (TESSALON) 200 MG capsule Take 1 capsule (200 mg total) by mouth every 8 (eight) hours. 04/17/20   Tasia Catchings, Cecilee Rosner V, PA-C  Blood Glucose Calibration (ACCU-CHEK SMARTVIEW CONTROL) LIQD To be used to calibrate glucose meter as needed. 07/05/17   Hali Marry, MD  Blood Glucose Monitoring Suppl (ACCU-CHEK NANO SMARTVIEW) w/Device KIT Check  fasting blood sugars every morning and once after largest meal of the day a few x a week. DX:R73.01 07/10/17   Hali Marry, MD  cetirizine (ZYRTEC ALLERGY) 10 MG tablet Take 1 tablet (10 mg total) by mouth daily. 01/08/18   Law, Bea Graff, PA-C  Cyanocobalamin (VITAMIN B-12 PO) Take 1 tablet by mouth every Wednesday.    [provider]  desvenlafaxine (PRISTIQ) 100 MG 24 hr tablet Take 1 tablet (100 mg total) by mouth daily. 02/26/19   Hali Marry, MD  Docusate Sodium (COLACE PO)  06/21/16   [provider]  EPINEPHrine 0.3 mg/0.3 mL IJ SOAJ injection Inject 0.3 mLs (0.3 mg total) into the muscle once. 12/17/15   Hali Marry, MD  fluticasone (FLONASE) 50 MCG/ACT nasal spray Place 2 sprays into both nostrils daily. 04/17/20   Tasia Catchings, Cynthie Garmon V, PA-C  gabapentin (NEURONTIN) 300 MG capsule TAKE 2 CAPSULES EVERY DAY 11/18/19   Hali Marry, MD  metFORMIN (GLUCOPHAGE-XR) 500 MG 24 hr tablet TAKE 1 TABLET DAILY WITH BREAKFAST. 05/08/19   Hali Marry, MD  metoprolol succinate (TOPROL-XL) 25 MG 24 hr tablet TAKE 1 TABLET EVERY DAY 11/18/19   Hali Marry, MD  omeprazole (PRILOSEC) 40 MG capsule TAKE 1 CAPSULE EVERY DAY 11/18/19   Hali Marry, MD  Oxycodone HCl 10 MG TABS Take 10 mg by mouth daily. 03/14/19   [provider]    Family History Family History  Problem Relation Age of Onset  . Lung cancer Mother   . Alcohol abuse Mother   . Colon cancer Brother        late 53's  . Liver cancer Brother   . Alcohol abuse Brother   . Cirrhosis Sister        secondary medication  . Diabetes Sister   . Kidney failure Brother        Dialysis at 34, born single kidney  . Esophageal cancer Neg Hx   . Stomach cancer Neg Hx   . Rectal cancer Neg Hx     Social History Social History   Tobacco Use  . Smoking status: Former Smoker    Packs/day: 0.50    Years: 18.00    Pack years: 9.00    Quit date: 08/04/1990    Years since  quitting: 29.7  . Smokeless tobacco: Never Used  Vaping Use  . Vaping Use: Never used  Substance Use Topics  . Alcohol use: No  . Drug use: No     Allergies   Bee venom, Loratadine, Nsaids, and Latex   Review of Systems Review of Systems  Reason unable to perform ROS: See HPI as above.     Physical Exam Triage Vital Signs ED Triage Vitals  Enc Vitals Group     BP 04/17/20 1248 129/76     Pulse Rate 04/17/20 1248 66     Resp 04/17/20 1248 16     Temp 04/17/20 1248 98.1  F (36.7 C)     Temp Source 04/17/20 1248 Oral     SpO2 04/17/20 1248 96 %     Weight --      Height --      Head Circumference --      Peak Flow --      Pain Score 04/17/20 1249 5     Pain Loc --      Pain Edu? --      Excl. in [place]? --    No data found.  Updated Vital Signs BP 129/76 (BP Location: Left Arm)   Pulse 66   Temp 98.1 F (36.7 C) (Oral)   Resp 16   SpO2 96%   Visual Acuity Right Eye Distance:   Left Eye Distance:   Bilateral Distance:    Right Eye Near:   Left Eye Near:    Bilateral Near:     Physical Exam Constitutional:      General: She is not in acute distress.    Appearance: Normal appearance. She is well-developed. She is not ill-appearing, toxic-appearing or diaphoretic.  HENT:     Head: Normocephalic and atraumatic.     Right Ear: Tympanic membrane, ear canal and external ear normal. Tympanic membrane is not erythematous or bulging.     Left Ear: Tympanic membrane, ear canal and external ear normal. Tympanic membrane is not erythematous or bulging.     Nose:     Right Sinus: No maxillary sinus tenderness or frontal sinus tenderness.     Left Sinus: No maxillary sinus tenderness or frontal sinus tenderness.     Mouth/Throat:     Mouth: Mucous membranes are moist.     Pharynx: Oropharynx is clear. Uvula midline.  Eyes:     Conjunctiva/sclera: Conjunctivae normal.     Pupils: Pupils are equal, round, and reactive to light.  Cardiovascular:     Rate and Rhythm:  Normal rate and regular rhythm.  Pulmonary:     Effort: Pulmonary effort is normal. No accessory muscle usage, prolonged expiration, respiratory distress or retractions.     Breath sounds: No decreased air movement or transmitted upper airway sounds. No decreased breath sounds.     Comments: LCTAB Musculoskeletal:     Cervical back: Normal range of motion and neck supple.  Skin:    General: Skin is warm and dry.  Neurological:     Mental Status: She is alert and oriented to person, place, and time.      UC Treatments / Results  Labs (all labs ordered are listed, but only abnormal results are displayed) Labs Reviewed  NOVEL CORONAVIRUS, NAA    EKG   Radiology No results found.  Procedures Procedures (including critical care time)  Medications Ordered in UC Medications - No data to display  Initial Impression / Assessment and Plan / UC Course  I have reviewed the triage vital signs and the nursing notes.  Pertinent labs & imaging results that were available during my care of the patient were reviewed by me and considered in my medical decision making (see chart for details).    COVID PCR test ordered. Patient to quarantine until testing results return. No alarming signs on exam.  Afebrile, nonotoxic. Patient speaking in full sentences without respiratory distress. LCTAB. Symptomatic treatment discussed.  Push fluids.  Return precautions given.  Patient expresses understanding and agrees to plan.  Final Clinical Impressions(s) / UC Diagnoses   Final diagnoses:  Cough  Rhinorrhea    ED Prescriptions  Medication Sig Dispense Auth. Provider   fluticasone (FLONASE) 50 MCG/ACT nasal spray Place 2 sprays into both nostrils daily. 1 g Lilliauna Van V, PA-C   benzonatate (TESSALON) 200 MG capsule Take 1 capsule (200 mg total) by mouth every 8 (eight) hours. 21 capsule Ok Edwards, PA-C     PDMP not reviewed this encounter.   Ok Edwards, PA-C 04/17/20 1317

## 2020-04-17 NOTE — Discharge Instructions (Signed)
COVID PCR testing ordered. I would like you to quarantine until testing results. Tessalon for cough. Start flonase nasal spray for nasal congestion/drainage. You can use over the counter nasal saline rinse such as neti pot for nasal congestion. Keep hydrated, your urine should be clear to pale yellow in color. Tylenol/motrin for fever and pain. Monitor for any worsening of symptoms, chest pain, shortness of breath, wheezing, swelling of the throat, go to the emergency department for further evaluation needed.

## 2020-04-18 LAB — SARS-COV-2, NAA 2 DAY TAT

## 2020-04-18 LAB — NOVEL CORONAVIRUS, NAA: SARS-CoV-2, NAA: NOT DETECTED

## 2020-04-25 ENCOUNTER — Other Ambulatory Visit: Payer: Self-pay | Admitting: Family Medicine

## 2020-05-03 ENCOUNTER — Telehealth: Payer: Self-pay

## 2020-05-03 NOTE — Telephone Encounter (Signed)
Called and informed pt that refill has been requested.

## 2020-05-03 NOTE — Telephone Encounter (Signed)
Patient requested I let Kenney Houseman know that she is due for Pristiq refill. Patient states we needed to place that and she would pick it up from our office (I am assuming mail order?)  Note to [place]

## 2020-05-03 NOTE — Telephone Encounter (Signed)
Order for Pristiq 100 mg Customer ID# [id number] Order to arrive in 7-10 days order # is [id number]   Saturday September 18th will be the next date for refill.

## 2020-05-21 DIAGNOSIS — M542 Cervicalgia: Secondary | ICD-10-CM | POA: Diagnosis not present

## 2020-05-21 DIAGNOSIS — G894 Chronic pain syndrome: Secondary | ICD-10-CM | POA: Diagnosis not present

## 2020-05-21 DIAGNOSIS — M5116 Intervertebral disc disorders with radiculopathy, lumbar region: Secondary | ICD-10-CM | POA: Diagnosis not present

## 2020-05-21 DIAGNOSIS — Z79891 Long term (current) use of opiate analgesic: Secondary | ICD-10-CM | POA: Diagnosis not present

## 2020-05-21 DIAGNOSIS — M47892 Other spondylosis, cervical region: Secondary | ICD-10-CM | POA: Diagnosis not present

## 2020-05-21 DIAGNOSIS — Z79899 Other long term (current) drug therapy: Secondary | ICD-10-CM | POA: Diagnosis not present

## 2020-05-31 ENCOUNTER — Ambulatory Visit (INDEPENDENT_AMBULATORY_CARE_PROVIDER_SITE_OTHER): Payer: Medicare HMO | Admitting: Family Medicine

## 2020-05-31 ENCOUNTER — Encounter: Payer: Self-pay | Admitting: Family Medicine

## 2020-05-31 VITALS — BP 136/69 | HR 60 | Ht 67.0 in | Wt 188.0 lb

## 2020-05-31 DIAGNOSIS — E1122 Type 2 diabetes mellitus with diabetic chronic kidney disease: Secondary | ICD-10-CM | POA: Diagnosis not present

## 2020-05-31 DIAGNOSIS — Z01818 Encounter for other preprocedural examination: Secondary | ICD-10-CM | POA: Diagnosis not present

## 2020-05-31 NOTE — Progress Notes (Signed)
Established Patient Office Visit  Subjective:  Patient ID: Theresa Scott, female    DOB: 1956-06-02  Age: 64 y.o. MRN: [id number]  CC:  Chief Complaint  Patient presents with  . surgical clearance    HPI Theresa Scott is a 64 year old female with a history of fatty liver disease, impaired fasting glucose, osteoporosis, hyperlipidemia, and depression/anxiety presents for preoperative clearance to have the battery pack removed for her spinal cord stimulator.  She is otherwise doing well.  Unfortunately the battery just will not keep a charge.  She is not currently on any blood thinners and denies taking any NSAIDs or aspirin, fish oil or ginseng.  She is physically active even though she does not have a specific exercise routine.  She feels like she could walk 1 city block without having to stop and rest.  She denies any recent chest pain shortness of breath.  He has had several surgeries in the last 10 years and has not had any complications with surgery or anesthesia.  She reports her home blood pressures have been really well controlled.   Past Medical History:  Diagnosis Date  . Chronic kidney disease   . Complication of anesthesia    hard to wake up and swelling after kidney stone removal in 1980 per pt  . Degenerative disc disease   . Depression   . Diabetes mellitus   . Fatty liver disease, nonalcoholic   . Gallstones   . GERD (gastroesophageal reflux disease)   . Hypercholesterolemia   . Hypertension   . Kidney stones   . Obesity   . Osteoporosis   . Pancreatitis     Past Surgical History:  Procedure Laterality Date  . BACK SURGERY  1990,2003,2004,2010  . CARDIAC CATHETERIZATION  2011   [facility].   Marland Kitchen CARDIAC SURGERY  2002   ablation  . CHOLECYSTECTOMY  1976  . COLONOSCOPY  2008  . ESOPHAGOGASTRODUODENOSCOPY N/A 01/27/2013   Procedure: ESOPHAGOGASTRODUODENOSCOPY (EGD) upper endoscopy;  Surgeon: Shann Medal, MD;  Location: [facility];  Service: General;   Laterality: N/A;  . 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 HAND SURGERY  2000   right  . TONSILLECTOMY  1971    Family History  Problem Relation Age of Onset  . Lung cancer Mother   . Alcohol abuse Mother   . Colon cancer Brother        late 40's  . Liver cancer Brother   . Alcohol abuse Brother   . Cirrhosis Sister        secondary medication  . Diabetes Sister   . Kidney failure Brother        Dialysis at 56, born single kidney  . Esophageal cancer Neg Hx   . Stomach cancer Neg Hx   . Rectal cancer Neg Hx       Outpatient Medications Prior to Visit  Medication Sig Dispense Refill  . ACCU-CHEK FASTCLIX LANCETS MISC For testing blood sugars 1-2 times daily. DX: R73.01 306 each 4  . ACCU-CHEK SMARTVIEW test strip TEST BLOOD SUGAR TWICE DAILY 200 strip 11  . Alcohol Swabs (B-D SINGLE USE SWABS REGULAR) PADS USE TOPICALLY  1  TO  2  TIMES  DAILY AS DIRECTED 200 each 4  . alendronate (FOSAMAX) 70 MG tablet Take 1 tablet (70 mg total) by mouth every 7 (seven) days. Take with a full glass of water on an empty stomach. 12 tablet 4  . atorvastatin (LIPITOR) 20 MG tablet TAKE 1 TABLET  EVERY DAY 90 tablet 3  . baclofen (LIORESAL) 10 MG tablet     . Blood Glucose Calibration (ACCU-CHEK SMARTVIEW CONTROL) LIQD To be used to calibrate glucose meter as needed. 1 each prn  . Blood Glucose Monitoring Suppl (ACCU-CHEK NANO SMARTVIEW) w/Device KIT Check fasting blood sugars every morning and once after largest meal of the day a few x a week. DX:R73.01 1 kit 0  . cetirizine (ZYRTEC ALLERGY) 10 MG tablet Take 1 tablet (10 mg total) by mouth daily. 30 tablet 0  . Cyanocobalamin (VITAMIN B-12 PO) Take 1 tablet by mouth every Wednesday.    Marland Kitchen desvenlafaxine (PRISTIQ) 100 MG 24 hr tablet Take 1 tablet (100 mg total) by mouth daily. 90 tablet 3  . Docusate Sodium (COLACE PO)     . EPINEPHrine 0.3 mg/0.3 mL IJ SOAJ injection Inject 0.3 mLs (0.3 mg total) into the muscle once. 2 Device prn  . fluticasone  (FLONASE) 50 MCG/ACT nasal spray Place 2 sprays into both nostrils daily. 1 g 0  . gabapentin (NEURONTIN) 300 MG capsule TAKE 2 CAPSULES EVERY DAY 180 capsule 1  . metFORMIN (GLUCOPHAGE-XR) 500 MG 24 hr tablet TAKE 1 TABLET DAILY WITH BREAKFAST. 90 tablet 3  . metoprolol succinate (TOPROL-XL) 25 MG 24 hr tablet TAKE 1 TABLET EVERY DAY 90 tablet 1  . omeprazole (PRILOSEC) 40 MG capsule TAKE 1 CAPSULE EVERY DAY 90 capsule 3  . Oxycodone HCl 10 MG TABS Take 10 mg by mouth daily.    . benzonatate (TESSALON) 200 MG capsule Take 1 capsule (200 mg total) by mouth every 8 (eight) hours. 21 capsule 0   No facility-administered medications prior to visit.    Allergies  Allergen Reactions  . Bee Venom Anaphylaxis  . Loratadine Itching    REACTION: dizziness  . Nsaids Other (See Comments)    GI intolerance.    . Latex Rash    ROS Review of Systems    Objective:    Physical Exam Constitutional:      Appearance: She is well-developed.  HENT:     Head: Normocephalic and atraumatic.     Right Ear: Tympanic membrane, ear canal and external ear normal.     Left Ear: Tympanic membrane, ear canal and external ear normal.     Nose: Nose normal.     Mouth/Throat:     Mouth: Mucous membranes are moist.     Pharynx: No oropharyngeal exudate.  Eyes:     Conjunctiva/sclera: Conjunctivae normal.     Pupils: Pupils are equal, round, and reactive to light.  Neck:     Thyroid: No thyromegaly.  Cardiovascular:     Rate and Rhythm: Normal rate and regular rhythm.     Heart sounds: Normal heart sounds.  Pulmonary:     Effort: Pulmonary effort is normal.     Breath sounds: Normal breath sounds. No wheezing.  Abdominal:     General: Abdomen is flat. Bowel sounds are normal.     Palpations: Abdomen is soft.  Musculoskeletal:     Cervical back: Neck supple.  Lymphadenopathy:     Cervical: No cervical adenopathy.  Skin:    General: Skin is warm and dry.  Neurological:     Mental Status: She is  alert and oriented to person, place, and time.  Psychiatric:        Mood and Affect: Mood normal.        Behavior: Behavior normal.     BP 136/69   Pulse 60  Ht _0  (1.702 m)   Wt 188 lb (85.3 kg)   SpO2 95%   BMI 29.44 kg/m  Wt Readings from Last 3 Encounters:  05/31/20 188 lb (85.3 kg)  03/11/20 190 lb (86.2 kg)  01/02/20 188 lb (85.3 kg)     Health Maintenance Due  Topic Date Due  . FOOT EXAM  11/28/2019  . URINE MICROALBUMIN  11/28/2019  . INFLUENZA VACCINE  05/23/2020    There are no preventive care reminders to display for this patient.  Lab Results  Component Value Date   TSH 1.03 03/11/2020   Lab Results  Component Value Date   WBC 4.8 03/15/2020   HGB 12.3 03/15/2020   HCT 38.0 03/15/2020   MCV 89.8 03/15/2020   PLT 153 03/15/2020   Lab Results  Component Value Date   NA 143 03/11/2020   K 3.8 03/11/2020   CO2 32 03/11/2020   GLUCOSE 150 (H) 03/11/2020   BUN 12 03/11/2020   CREATININE 0.74 03/11/2020   BILITOT 0.8 03/11/2020   ALKPHOS 95 11/07/2016   AST 30 03/11/2020   ALT 23 03/11/2020   PROT 6.4 03/11/2020   ALBUMIN 3.6 11/07/2016   CALCIUM 9.1 03/11/2020   Lab Results  Component Value Date   CHOL 57 03/15/2020   Lab Results  Component Value Date   HDL 24 (L) 03/15/2020   Lab Results  Component Value Date   LDLCALC 20 03/15/2020   Lab Results  Component Value Date   TRIG 52 03/15/2020   Lab Results  Component Value Date   CHOLHDL 2.4 03/15/2020   Lab Results  Component Value Date   HGBA1C 6.1 (H) 03/11/2020      Assessment & Plan:   Problem List Items Addressed This Visit    None    Visit Diagnoses    Preop examination    -  Primary   Relevant Orders   EKG 12-Lead     She is cleared for surgery today.  Blood pressure is okay and last A1c was 6.1 on May 20.  Last CBC was May 24 hemoglobin was normal at 12.3.  EKG performed today, showing rate of 61 bpm, no acute ST-T wave changes.  Recommended avoid all  NSAIDs, aspirin, fish oil etc 1 week prior to surgery.   Lab Results  Component Value Date   WBC 4.8 03/15/2020   HGB 12.3 03/15/2020   HCT 38.0 03/15/2020   MCV 89.8 03/15/2020   PLT 153 03/15/2020     No orders of the defined types were placed in this encounter.   Follow-up: Return if symptoms worsen or fail to improve.    Beatrice Lecher, MD

## 2020-06-10 ENCOUNTER — Other Ambulatory Visit: Payer: Self-pay | Admitting: Family Medicine

## 2020-06-10 DIAGNOSIS — Z1231 Encounter for screening mammogram for malignant neoplasm of breast: Secondary | ICD-10-CM

## 2020-06-10 DIAGNOSIS — Z23 Encounter for immunization: Secondary | ICD-10-CM | POA: Diagnosis not present

## 2020-06-29 DIAGNOSIS — M5116 Intervertebral disc disorders with radiculopathy, lumbar region: Secondary | ICD-10-CM | POA: Diagnosis not present

## 2020-06-29 DIAGNOSIS — G894 Chronic pain syndrome: Secondary | ICD-10-CM | POA: Diagnosis not present

## 2020-06-29 DIAGNOSIS — M47892 Other spondylosis, cervical region: Secondary | ICD-10-CM | POA: Diagnosis not present

## 2020-07-01 DIAGNOSIS — Z23 Encounter for immunization: Secondary | ICD-10-CM | POA: Diagnosis not present

## 2020-07-15 ENCOUNTER — Other Ambulatory Visit: Payer: Self-pay

## 2020-07-15 ENCOUNTER — Ambulatory Visit: Payer: Medicare HMO

## 2020-08-03 ENCOUNTER — Other Ambulatory Visit: Payer: Self-pay | Admitting: Family Medicine

## 2020-08-05 ENCOUNTER — Telehealth: Payer: Self-pay

## 2020-08-05 NOTE — Telephone Encounter (Signed)
Theresa Scott called and states she needs a refill on Pristiq.   I called patient assistance [phone] for ID [id number] order number [phone].

## 2020-08-09 DIAGNOSIS — M47892 Other spondylosis, cervical region: Secondary | ICD-10-CM | POA: Diagnosis not present

## 2020-08-09 DIAGNOSIS — T85112A Breakdown (mechanical) of implanted electronic neurostimulator (electrode) of spinal cord, initial encounter: Secondary | ICD-10-CM | POA: Diagnosis not present

## 2020-08-09 DIAGNOSIS — G894 Chronic pain syndrome: Secondary | ICD-10-CM | POA: Diagnosis not present

## 2020-08-09 DIAGNOSIS — M5116 Intervertebral disc disorders with radiculopathy, lumbar region: Secondary | ICD-10-CM | POA: Diagnosis not present

## 2020-08-11 DIAGNOSIS — Z8679 Personal history of other diseases of the circulatory system: Secondary | ICD-10-CM | POA: Diagnosis not present

## 2020-08-11 DIAGNOSIS — Z01810 Encounter for preprocedural cardiovascular examination: Secondary | ICD-10-CM | POA: Diagnosis not present

## 2020-08-11 DIAGNOSIS — M961 Postlaminectomy syndrome, not elsewhere classified: Secondary | ICD-10-CM | POA: Diagnosis not present

## 2020-08-11 DIAGNOSIS — G8929 Other chronic pain: Secondary | ICD-10-CM | POA: Diagnosis not present

## 2020-08-11 DIAGNOSIS — Z01812 Encounter for preprocedural laboratory examination: Secondary | ICD-10-CM | POA: Diagnosis not present

## 2020-08-11 DIAGNOSIS — T85890A Other specified complication of nervous system prosthetic devices, implants and grafts, initial encounter: Secondary | ICD-10-CM | POA: Diagnosis not present

## 2020-08-12 ENCOUNTER — Other Ambulatory Visit: Payer: Self-pay

## 2020-08-12 ENCOUNTER — Ambulatory Visit (INDEPENDENT_AMBULATORY_CARE_PROVIDER_SITE_OTHER): Payer: Medicare HMO

## 2020-08-12 DIAGNOSIS — Z1231 Encounter for screening mammogram for malignant neoplasm of breast: Secondary | ICD-10-CM

## 2020-08-16 DIAGNOSIS — G894 Chronic pain syndrome: Secondary | ICD-10-CM | POA: Diagnosis not present

## 2020-08-16 DIAGNOSIS — M4716 Other spondylosis with myelopathy, lumbar region: Secondary | ICD-10-CM | POA: Diagnosis not present

## 2020-08-16 DIAGNOSIS — Z7984 Long term (current) use of oral hypoglycemic drugs: Secondary | ICD-10-CM | POA: Diagnosis not present

## 2020-08-16 DIAGNOSIS — E119 Type 2 diabetes mellitus without complications: Secondary | ICD-10-CM | POA: Diagnosis not present

## 2020-08-16 DIAGNOSIS — T85112A Breakdown (mechanical) of implanted electronic neurostimulator (electrode) of spinal cord, initial encounter: Secondary | ICD-10-CM | POA: Diagnosis not present

## 2020-08-16 DIAGNOSIS — M961 Postlaminectomy syndrome, not elsewhere classified: Secondary | ICD-10-CM | POA: Diagnosis not present

## 2020-08-16 HISTORY — PX: SUBTHALAMIC STIMULATOR BATTERY REPLACEMENT: SHX5405

## 2020-09-15 DIAGNOSIS — M545 Low back pain, unspecified: Secondary | ICD-10-CM | POA: Diagnosis not present

## 2020-09-15 DIAGNOSIS — G894 Chronic pain syndrome: Secondary | ICD-10-CM | POA: Diagnosis not present

## 2020-09-29 ENCOUNTER — Other Ambulatory Visit: Payer: Self-pay | Admitting: Family Medicine

## 2020-10-12 DIAGNOSIS — M5116 Intervertebral disc disorders with radiculopathy, lumbar region: Secondary | ICD-10-CM | POA: Diagnosis not present

## 2020-10-12 DIAGNOSIS — G894 Chronic pain syndrome: Secondary | ICD-10-CM | POA: Diagnosis not present

## 2020-11-11 ENCOUNTER — Telehealth: Payer: Self-pay

## 2020-11-11 NOTE — Telephone Encounter (Signed)
Patient dropped off paperwork to get filled out to get free RX. Paperwork in the box.

## 2020-11-16 NOTE — Telephone Encounter (Signed)
Forms completed and placed in [place] B[facility] box

## 2020-11-17 NOTE — Telephone Encounter (Signed)
Form completed,faxed,confirmation received and scanned into patient's chart.

## 2020-12-06 ENCOUNTER — Other Ambulatory Visit: Payer: Self-pay | Admitting: *Deleted

## 2020-12-06 MED ORDER — ONDANSETRON 4 MG PO TBDP: ORAL_TABLET | ORAL | 4 refills | Status: DC

## 2020-12-06 MED ORDER — METFORMIN HCL ER 500 MG PO TB24: 500.0000 mg | ORAL_TABLET | Freq: Every day | ORAL | 3 refills | Status: DC

## 2020-12-06 MED ORDER — GABAPENTIN 300 MG PO CAPS: 600.0000 mg | ORAL_CAPSULE | Freq: Every day | ORAL | 1 refills | Status: DC

## 2020-12-06 MED ORDER — ALENDRONATE SODIUM 70 MG PO TABS: ORAL_TABLET | ORAL | 4 refills | Status: DC

## 2020-12-06 MED ORDER — ATORVASTATIN CALCIUM 20 MG PO TABS: 20.0000 mg | ORAL_TABLET | Freq: Every day | ORAL | 3 refills | Status: DC

## 2020-12-06 MED ORDER — METOPROLOL SUCCINATE ER 25 MG PO TB24: 25.0000 mg | ORAL_TABLET | Freq: Every day | ORAL | 1 refills | Status: DC

## 2020-12-27 ENCOUNTER — Telehealth: Payer: Self-pay | Admitting: Family Medicine

## 2020-12-27 ENCOUNTER — Encounter: Payer: Medicare HMO | Admitting: Family Medicine

## 2020-12-27 NOTE — Telephone Encounter (Signed)
Pt called at 10:00 her appointment for today.  She had a family emergency in [place] and got back late last night.  Thank you.

## 2021-01-10 ENCOUNTER — Telehealth: Payer: Self-pay | Admitting: *Deleted

## 2021-01-10 NOTE — Telephone Encounter (Signed)
Called pt and lvm advising her of what has been done and asked that she call back if any questions/concerns

## 2021-01-10 NOTE — Telephone Encounter (Signed)
Called [employer] patient asst. And was informed that pt's information was not received.  Was advised to resend and that her application is good for 90 days.   Form faxed confirmation received.

## 2021-01-17 ENCOUNTER — Other Ambulatory Visit: Payer: Self-pay | Admitting: *Deleted

## 2021-01-17 DIAGNOSIS — R7301 Impaired fasting glucose: Secondary | ICD-10-CM

## 2021-01-17 NOTE — Telephone Encounter (Signed)
Spoke w/pt and informed her that she will need to sign the application before they will accept it. She stated that she has an appointment on Wednesday and will sign then

## 2021-01-18 ENCOUNTER — Encounter: Payer: Medicare Other | Admitting: Family Medicine

## 2021-01-19 ENCOUNTER — Ambulatory Visit (INDEPENDENT_AMBULATORY_CARE_PROVIDER_SITE_OTHER): Payer: Medicare Other | Admitting: Medical-Surgical

## 2021-01-19 ENCOUNTER — Other Ambulatory Visit: Payer: Self-pay

## 2021-01-19 ENCOUNTER — Other Ambulatory Visit: Payer: Self-pay | Admitting: *Deleted

## 2021-01-19 VITALS — BP 159/74 | HR 57 | Ht 67.0 in | Wt 187.1 lb

## 2021-01-19 DIAGNOSIS — Z0000 Encounter for general adult medical examination without abnormal findings: Secondary | ICD-10-CM

## 2021-01-19 DIAGNOSIS — Z780 Asymptomatic menopausal state: Secondary | ICD-10-CM | POA: Diagnosis not present

## 2021-01-19 DIAGNOSIS — M810 Age-related osteoporosis without current pathological fracture: Secondary | ICD-10-CM

## 2021-01-19 MED ORDER — DESVENLAFAXINE SUCCINATE ER 100 MG PO TB24: 100.0000 mg | ORAL_TABLET | Freq: Every day | ORAL | 3 refills | Status: DC

## 2021-01-19 NOTE — Progress Notes (Addendum)
MEDICARE ANNUAL WELLNESS VISIT  01/19/2021  Subjective:  Theresa Scott is a 65 y.o. female patient of [name], Rene Kocher, MD who had a Medicare Annual Wellness Visit today. Oney is Disabled and lives with their family. she has 4 children. she reports that she is socially active and does interact with friends/family regularly. she is minimally physically active and enjoys crotcheting.  Patient Care Team: Hali Marry, MD as PCP - General (Family Medicine) Lacie Scotts, MD as Consulting Physician Gatha Mayer, MD as Consulting Physician (Gastroenterology)  Advanced Directives 01/19/2021 06/17/2019 01/08/2018 12/04/2017 01/15/2017 01/07/2017 12/17/2015  Does Patient Have a Medical Advance Directive? _0  No No  Would patient like information on creating a medical advance directive? No - Patient declined No - Patient declined - No - Patient declined - - No - patient declined information    Hospital Utilization Over the Past 12 Months: # of hospitalizations or ER visits: 0 # of surgeries: 1  Review of Systems    Patient reports that her overall health is worse when compared to last year.  Review of Systems: History obtained from chart review and the patient  All other systems negative.  Pain Assessment Pain : 0-10 Pain Score: 8  Pain Type: Chronic pain Pain Location: Back Pain Orientation: Lower Pain Radiating Towards: Legs Pain Descriptors / Indicators: Aching Pain Onset: More than a month ago Pain Frequency: Intermittent Pain Relieving Factors: pain medication and rest.  Pain Relieving Factors: pain medication and rest.  Current Medications & Allergies (verified) Allergies as of 01/19/2021      Reactions   Bee Venom Anaphylaxis   Loratadine Itching   REACTION: dizziness   Nsaids Other (See Comments)   GI intolerance.     Latex Rash      Medication List       Accurate as of January 19, 2021  9:53 AM. If you have any questions, ask your  nurse or doctor.        Accu-Chek FastClix Lancets Misc For testing blood sugars 1-2 times daily. DX: R73.01   Accu-Chek Nano SmartView w/Device Kit Check fasting blood sugars every morning and once after largest meal of the day a few x a week. DX:R73.01   Accu-Chek SmartView Control Liqd To be used to calibrate glucose meter as needed.   Accu-Chek SmartView test strip Generic drug: glucose blood TEST BLOOD SUGAR TWICE DAILY   alendronate 70 MG tablet Commonly known as: FOSAMAX TAKE 1 TABLET (70 MG TOTAL) BY MOUTH EVERY 7 DAYS. TAKE WITH A FULL GLASS OF WATER ON AN EMPTY STOMACH.   atorvastatin 20 MG tablet Commonly known as: LIPITOR Take 1 tablet (20 mg total) by mouth daily.   B-D SINGLE USE SWABS REGULAR Pads USE TOPICALLY  1  TO  2  TIMES  DAILY AS DIRECTED   baclofen 10 MG tablet Commonly known as: LIORESAL daily.   cetirizine 10 MG tablet Commonly known as: ZyrTEC Allergy Take 1 tablet (10 mg total) by mouth daily. What changed:   when to take this  reasons to take this   COLACE PO   desvenlafaxine 100 MG 24 hr tablet Commonly known as: PRISTIQ Take 1 tablet (100 mg total) by mouth daily.   EPINEPHrine 0.3 mg/0.3 mL Soaj injection Commonly known as: EPI-PEN Inject 0.3 mLs (0.3 mg total) into the muscle once.   fluticasone 50 MCG/ACT nasal spray Commonly known as: FLONASE Place 2 sprays into both nostrils daily.  gabapentin 300 MG capsule Commonly known as: NEURONTIN Take 2 capsules (600 mg total) by mouth daily.   metFORMIN 500 MG 24 hr tablet Commonly known as: GLUCOPHAGE-XR Take 1 tablet (500 mg total) by mouth daily with breakfast.   metoprolol succinate 25 MG 24 hr tablet Commonly known as: TOPROL-XL Take 1 tablet (25 mg total) by mouth daily.   omeprazole 40 MG capsule Commonly known as: PRILOSEC TAKE 1 CAPSULE EVERY DAY   ondansetron 4 MG disintegrating tablet Commonly known as: ZOFRAN-ODT DISSOLVE 1 TABLET ON THE TONGUE EVERY 8  HOURS AS NEEDED FOR NAUSEA AND VOMITING   Oxycodone HCl 10 MG Tabs Take 10 mg by mouth daily.   VITAMIN B-12 PO Take 1 tablet by mouth every Wednesday.       History (reviewed): Past Medical History:  Diagnosis Date  . Chronic kidney disease   . Complication of anesthesia    hard to wake up and swelling after kidney stone removal in 1980 per pt  . Degenerative disc disease   . Depression   . Diabetes mellitus   . Fatty liver disease, nonalcoholic   . Gallstones   . GERD (gastroesophageal reflux disease)   . Hypercholesterolemia   . Hypertension   . Kidney stones   . Obesity   . Osteoporosis   . Pancreatitis    Past Surgical History:  Procedure Laterality Date  . BACK SURGERY  1990,2003,2004,2010  . CARDIAC CATHETERIZATION  2011   [facility].   Marland Kitchen CARDIAC SURGERY  2002   ablation  . CHOLECYSTECTOMY  1976  . COLONOSCOPY  2008  . ESOPHAGOGASTRODUODENOSCOPY N/A 01/27/2013   Procedure: ESOPHAGOGASTRODUODENOSCOPY (EGD) upper endoscopy;  Surgeon: Shann Medal, MD;  Location: [facility];  Service: General;  Laterality: N/A;  . 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 HAND SURGERY  2000   right  . 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 TONSILLECTOMY  1971   Family History  Problem Relation Age of Onset  . Lung cancer Mother   . Alcohol abuse Mother   . Colon cancer Brother        late 68's  . Liver cancer Brother   . Alcohol abuse Brother   . Cirrhosis Sister        secondary medication  . Diabetes Sister   . Kidney failure Brother        Dialysis at 4, born single kidney  . Esophageal cancer Neg Hx   . Stomach cancer Neg Hx   . Rectal cancer Neg Hx    Social History   Socioeconomic History  . Marital status: Married    Spouse name: Mateo Flow  . Number of children: 4  . Years of education: 65  . Highest education level: GED or equivalent  Occupational History  . Occupation: [employer]    Comment: retired  Tobacco Use  . Smoking  status: Former Smoker    Packs/day: 0.50    Years: 18.00    Pack years: 9.00    Quit date: 08/04/1990    Years since quitting: 30.4  . Smokeless tobacco: Never Used  Vaping Use  . Vaping Use: Never used  Substance and Sexual Activity  . Alcohol use: No  . Drug use: No  . Sexual activity: Yes  Other Topics Concern  . Not on file  Social History Narrative   Married 1 daughter (84) and 3 adopted children (82, 3, 14 months)   Disabled, chronic back pain, former [employer] her.  No alcohol or caffeine tobacco   Social Determinants of Health   Financial Resource Strain: Low Risk   . Difficulty of Paying Living Expenses: Not hard at all  Food Insecurity: No Food Insecurity  . Worried About Charity fundraiser in the Last Year: Never true  . Ran Out of Food in the Last Year: Never true  Transportation Needs: No Transportation Needs  . Lack of Transportation (Medical): No  . Lack of Transportation (Non-Medical): No  Physical Activity: Inactive  . Days of Exercise per Week: 0 days  . Minutes of Exercise per Session: 0 min  Stress: No Stress Concern Present  . Feeling of Stress : Not at all  Social Connections: Moderately Integrated  . Frequency of Communication with Friends and Family: More than three times a week  . Frequency of Social Gatherings with Friends and Family: Once a week  . Attends Religious Services: More than 4 times per year  . Active Member of Clubs or Organizations: No  . Attends Archivist Meetings: Never  . Marital Status: Married    Activities of Daily Living In your present state of health, do you have any difficulty performing the following activities: 01/19/2021  Hearing? N  Vision? N  Difficulty concentrating or making decisions? N  Walking or climbing stairs? N  Dressing or bathing? N  Doing errands, shopping? N  Preparing Food and eating ? N  Using the Toilet? N  In the past six months, have you accidently leaked  urine? N  Do you have problems with loss of bowel control? N  Managing your Medications? N  Managing your Finances? N  Housekeeping or managing your Housekeeping? N  Some recent data might be hidden    Patient Education/Literacy How often do you need to have someone help you when you read instructions, pamphlets, or other written materials from your doctor or pharmacy?: 1 - Never What is the last grade level you completed in school?: Has her GED  Exercise Current Exercise Habits: The patient does not participate in regular exercise at present, Exercise limited by: orthopedic condition(s)  Diet Patient reports consuming 2 meals a day and 3-4 snack(s) a day Patient reports that her primary diet is: Regular Patient reports that she does have regular access to food.   Depression Screen PHQ 2/9 Scores 01/19/2021 03/11/2020 06/17/2019 06/05/2019 03/31/2019 11/27/2018 05/14/2018  PHQ - 2 Score 0 0 0 0 0 - 0  PHQ- 9 Score - - - - - - -  Exception Documentation - - - - - Patient refusal -     Fall Risk Fall Risk  01/19/2021 06/17/2019 06/05/2019 07/18/2017 04/17/2016  Falls in the past year? 1 0 0 Yes No  Number falls in past yr: 1 - 0 1 -  Injury with Fall? 0 - 0 - -  Risk for fall due to : Impaired balance/gait - - - -  Follow up Falls evaluation completed;Education provided;Falls prevention discussed Falls prevention discussed - - -     Objective:   BP (!) 159/74 (BP Location: Right Arm, Patient Position: Sitting, Cuff Size: Normal)   Pulse (!) 57   Ht _0  (1.702 m)   Wt 187 lb 1.9 oz (84.9 kg)   SpO2 100%   BMI 29.31 kg/m   Last Weight  Most recent update: 01/19/2021  9:09 AM   Weight  84.9 kg (187 lb 1.9 oz)            Body mass  index is 29.31 kg/m.  Hearing/Vision  . Cedric did not have difficulty with hearing/understanding during the face-to-face interview . Amsi did not have difficulty with her vision during the face-to-face interview . Reports that she has had a formal  eye exam by an eye care professional within the past year . Reports that she has not had a formal hearing evaluation within the past year  Cognitive Function: 6CIT Screen 01/19/2021 06/17/2019 06/05/2019  What Year? 0 points 0 points 0 points  What month? 0 points 0 points 0 points  What time? 0 points 0 points 0 points  Count back from 20 0 points 0 points 0 points  Months in reverse 0 points 0 points 0 points  Repeat phrase 0 points 2 points 2 points  Total Score 0 2 2    Normal Cognitive Function Screening: Yes (Normal:0-7, Significant for Dysfunction: >8)  Immunization & Health Maintenance Record Immunization History  Administered Date(s) Administered  . Hep A / Hep B 08/06/2018, 11/27/2018  . Influenza Split 08/15/2011, 09/09/2012  . Influenza Whole 07/20/2008, 08/12/2009, 08/03/2010  . Influenza, High Dose Seasonal PF 07/18/2017, 06/07/2019  . Influenza,inj,Quad PF,6+ Mos 06/26/2013, 09/15/2014, 07/01/2015, 08/21/2016, 08/06/2018, 06/07/2019  . PFIZER(Purple Top)SARS-COV-2 Vaccination 06/10/2020, 07/01/2020  . Pneumococcal Polysaccharide-23 08/06/2018  . Pneumococcal-Unspecified 10/23/2001  . Tdap 11/19/2015    Health Maintenance  Topic Date Due  . PAP SMEAR-Modifier  01/19/2021 (Originally 12/16/2020)  . HEMOGLOBIN A1C  01/20/2021 (Originally 09/11/2020)  . COVID-19 Vaccine (3 - Booster for Pfizer series) 02/04/2021 (Originally 12/29/2020)  . URINE MICROALBUMIN  02/19/2021 (Originally 11/28/2019)  . INFLUENZA VACCINE  03/07/2021 (Originally 05/23/2020)  . FOOT EXAM  01/19/2022 (Originally 11/28/2019)  . OPHTHALMOLOGY EXAM  03/09/2021  . DEXA SCAN  07/08/2021  . MAMMOGRAM  08/12/2022  . TETANUS/TDAP  11/18/2025  . COLONOSCOPY (Pts 45-55yr Insurance coverage will need to be confirmed)  12/21/2027  . Hepatitis C Screening  Completed  . HIV Screening  Completed  . HPV VACCINES  Aged Out       Assessment  This is a routine wellness examination for BLENISHA LACAP  Health  Maintenance: Due or Overdue There are no preventive care reminders to display for this patient.  BDoreene Nestdoes not need a referral for Community Assistance: Care Management:   no Social Work:    no Prescription Assistance:  no Nutrition/Diabetes Education:  no   Plan:  Personalized Goals Goals Addressed              This Visit's Progress   .  Patient Stated (pt-stated)        01/19/2021 AWV Goal: Exercise for General Health   Patient will verbalize understanding of the benefits of increased physical activity:  Exercising regularly is important. It will improve your overall fitness, flexibility, and endurance.  Regular exercise also will improve your overall health. It can help you control your weight, reduce stress, and improve your bone density.  Over the next year, patient will increase physical activity as tolerated with a goal of at least 150 minutes of moderate physical activity per week.   You can tell that you are exercising at a moderate intensity if your heart starts beating faster and you start breathing faster but can still hold a conversation.  Moderate-intensity exercise ideas include:  Walking 1 mile (1.6 km) in about 15 minutes  Biking  Hiking  Golfing  Dancing  Water aerobics  Patient will verbalize understanding of everyday activities that increase physical activity by  providing examples like the following: ? Yard work, such as: ? Pushing a Conservation officer, nature ? Raking and bagging leaves ? Washing your car ? Pushing a stroller ? Shoveling [place] ? Gardening ? Washing windows or floors  Patient will be able to explain general safety guidelines for exercising:   Before you start a new exercise program, talk with your health care provider.  Do not exercise so much that you hurt yourself, feel dizzy, or get very short of breath.  Wear comfortable clothes and wear shoes with good support.  Drink plenty of water while you exercise to prevent  dehydration or heat stroke.  Work out until your breathing and your heartbeat get faster.       Personalized Health Maintenance & Screening Recommendations   Bone densitometry screening Urine Microalbumin, Foot exam, Pap smear  Lung Cancer Screening Recommended: no (Low Dose CT Chest recommended if Age 67-80 years, 30 pack-year currently smoking OR have quit w/in past 15 years) Hepatitis C Screening recommended: no HIV Screening recommended: no  Advanced Directives: Written information was not given per the patient's request.  Referrals & Orders Orders Placed This Encounter  Procedures  . [place]    Follow-up Plan . Follow-up with Hali Marry, MD as planned . Medicare wellness visit in one year. Marland Kitchen PAP smear, Urine Microalbumin, Foot exam, hemoglobin a1c will be done at your next in-office visit.  Marland Kitchen Referral for your bone density has been sent and they will call you to schedule.   I have personally reviewed and noted the following in the patient's chart:   . Medical and social history . Use of alcohol, tobacco or illicit drugs  . Current medications and supplements . Functional ability and status . Nutritional status . Physical activity . Advanced directives . List of other physicians . Hospitalizations, surgeries, and ER visits in previous 12 months . Vitals . Screenings to include cognitive, depression, and falls . Referrals and appointments  In addition, I have reviewed and discussed with patient certain preventive protocols, quality metrics, and best practice recommendations. A written personalized care plan for preventive services as well as general preventive health recommendations were provided to patient.     Tinnie Gens, RN  01/19/2021

## 2021-01-19 NOTE — Patient Instructions (Addendum)
[facility] Maintenance Summary and Written Plan of Care  Ms. Theresa Scott ,  Thank you for allowing me to perform your Medicare Annual Wellness Visit and for your ongoing commitment to your health.   Health Maintenance & Immunization History Health Maintenance  Topic Date Due  . PAP SMEAR-Modifier  01/19/2021 (Originally 12/16/2020)  . HEMOGLOBIN A1C  01/20/2021 (Originally 09/11/2020)  . COVID-19 Vaccine (3 - Booster for Pfizer series) 02/04/2021 (Originally 12/29/2020)  . URINE MICROALBUMIN  02/19/2021 (Originally 11/28/2019)  . INFLUENZA VACCINE  03/07/2021 (Originally 05/23/2020)  . FOOT EXAM  01/19/2022 (Originally 11/28/2019)  . OPHTHALMOLOGY EXAM  03/09/2021  . DEXA SCAN  07/08/2021  . MAMMOGRAM  08/12/2022  . TETANUS/TDAP  11/18/2025  . COLONOSCOPY (Pts 45-90yrs Insurance coverage will need to be confirmed)  12/21/2027  . Hepatitis C Screening  Completed  . HIV Screening  Completed  . HPV VACCINES  Aged Out   Immunization History  Administered Date(s) Administered  . Hep A / Hep B 08/06/2018, 11/27/2018  . Influenza Split 08/15/2011, 09/09/2012  . Influenza Whole 07/20/2008, 08/12/2009, 08/03/2010  . Influenza, High Dose Seasonal PF 07/18/2017, 06/07/2019  . Influenza,inj,Quad PF,6+ Mos 06/26/2013, 09/15/2014, 07/01/2015, 08/21/2016, 08/06/2018, 06/07/2019  . PFIZER(Purple Top)SARS-COV-2 Vaccination 06/10/2020, 07/01/2020  . Pneumococcal Polysaccharide-23 08/06/2018  . Pneumococcal-Unspecified 10/23/2001  . Tdap 11/19/2015    These are the patient goals that we discussed: Goals Addressed              This Visit's Progress   .  Patient Stated (pt-stated)        01/19/2021 AWV Goal: Exercise for General Health   Patient will verbalize understanding of the benefits of increased physical activity:  Exercising regularly is important. It will improve your overall fitness, flexibility, and endurance.  Regular exercise also will improve your overall  health. It can help you control your weight, reduce stress, and improve your bone density.  Over the next year, patient will increase physical activity as tolerated with a goal of at least 150 minutes of moderate physical activity per week.   You can tell that you are exercising at a moderate intensity if your heart starts beating faster and you start breathing faster but can still hold a conversation.  Moderate-intensity exercise ideas include:  Walking 1 mile (1.6 km) in about 15 minutes  Biking  Hiking  Golfing  Dancing  Water aerobics  Patient will verbalize understanding of everyday activities that increase physical activity by providing examples like the following: ? Yard work, such as: ? Pushing a Conservation officer, nature ? Raking and bagging leaves ? Washing your car ? Pushing a stroller ? Shoveling Heiler ? Gardening ? Washing windows or floors  Patient will be able to explain general safety guidelines for exercising:   Before you start a new exercise program, talk with your health care provider.  Do not exercise so much that you hurt yourself, feel dizzy, or get very short of breath.  Wear comfortable clothes and wear shoes with good support.  Drink plenty of water while you exercise to prevent dehydration or heat stroke.  Work out until your breathing and your heartbeat get faster.         This is a list of Health Maintenance Items that are overdue or due now: There are no preventive care reminders to display for this patient.   Orders/Referrals Placed Today: Orders Placed This Encounter  Procedures  . DEXAScan    Standing Status:   Future  Standing Expiration Date:   01/19/2022    Scheduling Instructions:     Please call patient to schedule. Her last Dexa Scan was on 07/09/19. She will need an appointment after 07/08/21. Thank you.    Order Specific Question:   Reason for exam:    Answer:   Post menopausal    Order Specific Question:   Preferred imaging  location?    Answer:   [facility]   (Contact our referral department at [phone] if you have not spoken with someone about your referral appointment within the next 5 days)    Follow-up Plan . Follow-up with Hali Marry, MD as planned . Medicare wellness visit in one year. Marland Kitchen PAP smear, Urine Microalbumin, Foot exam, hemoglobin a1c will be done at your next in-office visit.  Marland Kitchen Referral for your bone density has been sent and they will call you to schedule.     Health Maintenance, Female Adopting a healthy lifestyle and getting preventive care are important in promoting health and wellness. Ask your health care provider about:  The right schedule for you to have regular tests and exams.  Things you can do on your own to prevent diseases and keep yourself healthy. What should I know about diet, weight, and exercise? Eat a healthy diet  Eat a diet that includes plenty of vegetables, fruits, low-fat dairy products, and lean protein.  Do not eat a lot of foods that are high in solid fats, added sugars, or sodium.   Maintain a healthy weight Body mass index (BMI) is used to identify weight problems. It estimates body fat based on height and weight. Your health care provider can help determine your BMI and help you achieve or maintain a healthy weight. Get regular exercise Get regular exercise. This is one of the most important things you can do for your health. Most adults should:  Exercise for at least 150 minutes each week. The exercise should increase your heart rate and make you sweat (moderate-intensity exercise).  Do strengthening exercises at least twice a week. This is in addition to the moderate-intensity exercise.  Spend less time sitting. Even light physical activity can be beneficial. Watch cholesterol and blood lipids Have your blood tested for lipids and cholesterol at 65 years of age, then have this test every 5 years. Have your cholesterol  levels checked more often if:  Your lipid or cholesterol levels are high.  You are older than 65 years of age.  You are at high risk for heart disease. What should I know about cancer screening? Depending on your health history and family history, you may need to have cancer screening at various ages. This may include screening for:  Breast cancer.  Cervical cancer.  Colorectal cancer.  Skin cancer.  Lung cancer. What should I know about heart disease, diabetes, and high blood pressure? Blood pressure and heart disease  High blood pressure causes heart disease and increases the risk of stroke. This is more likely to develop in people who have high blood pressure readings, are of African descent, or are overweight.  Have your blood pressure checked: ? Every 3-5 years if you are 83-34 years of age. ? Every year if you are 1 years old or older. Diabetes Have regular diabetes screenings. This checks your fasting blood sugar level. Have the screening done:  Once every three years after age 80 if you are at a normal weight and have a low risk for diabetes.  More often and at  a younger age if you are overweight or have a high risk for diabetes. What should I know about preventing infection? Hepatitis B If you have a higher risk for hepatitis B, you should be screened for this virus. Talk with your health care provider to find out if you are at risk for hepatitis B infection. Hepatitis C Testing is recommended for:  Everyone born from [birth year] through 1965.  Anyone with known risk factors for hepatitis C. Sexually transmitted infections (STIs)  Get screened for STIs, including gonorrhea and chlamydia, if: ? You are sexually active and are younger than 65 years of age. ? You are older than 65 years of age and your health care provider tells you that you are at risk for this type of infection. ? Your sexual activity has changed since you were last screened, and you are at increased  risk for chlamydia or gonorrhea. Ask your health care provider if you are at risk.  Ask your health care provider about whether you are at high risk for HIV. Your health care provider may recommend a prescription medicine to help prevent HIV infection. If you choose to take medicine to prevent HIV, you should first get tested for HIV. You should then be tested every 3 months for as long as you are taking the medicine. Pregnancy  If you are about to stop having your period (premenopausal) and you may become pregnant, seek counseling before you get pregnant.  Take 400 to 800 micrograms (mcg) of folic acid every day if you become pregnant.  Ask for birth control (contraception) if you want to prevent pregnancy. Osteoporosis and menopause Osteoporosis is a disease in which the bones lose minerals and strength with aging. This can result in bone fractures. If you are 36 years old or older, or if you are at risk for osteoporosis and fractures, ask your health care provider if you should:  Be screened for bone loss.  Take a calcium or vitamin D supplement to lower your risk of fractures.  Be given hormone replacement therapy (HRT) to treat symptoms of menopause. Follow these instructions at home: Lifestyle  Do not use any products that contain nicotine or tobacco, such as cigarettes, e-cigarettes, and chewing tobacco. If you need help quitting, ask your health care provider.  Do not use street drugs.  Do not share needles.  Ask your health care provider for help if you need support or information about quitting drugs. Alcohol use  Do not drink alcohol if: ? Your health care provider tells you not to drink. ? You are pregnant, may be pregnant, or are planning to become pregnant.  If you drink alcohol: ? Limit how much you use to 0-1 drink a day. ? Limit intake if you are breastfeeding.  Be aware of how much alcohol is in your drink. In the [place] one drink equals one 12 oz bottle of beer  (355 mL), one 5 oz glass of wine (148 mL), or one 1 oz glass of hard liquor (44 mL). General instructions  Schedule regular health, dental, and eye exams.  Stay current with your vaccines.  Tell your health care provider if: ? You often feel depressed. ? You have ever been abused or do not feel safe at home. Summary  Adopting a healthy lifestyle and getting preventive care are important in promoting health and wellness.  Follow your health care provider's instructions about healthy diet, exercising, and getting tested or screened for diseases.  Follow your health care provider's  instructions on monitoring your cholesterol and blood pressure. This information is not intended to replace advice given to you by your health care provider. Make sure you discuss any questions you have with your health care provider. Document Revised: 10/02/2018 Document Reviewed: 10/02/2018 Elsevier Patient Education  2021 [employer].

## 2021-01-20 ENCOUNTER — Other Ambulatory Visit: Payer: Self-pay | Admitting: *Deleted

## 2021-01-20 LAB — HEMOGLOBIN A1C
Hgb A1c MFr Bld: 6.6 % of total Hgb — ABNORMAL HIGH (ref <5.7)
Mean Plasma Glucose: 143 mg/dL
eAG (mmol/L): 7.9 mmol/L

## 2021-01-20 LAB — BASIC METABOLIC PANEL WITH GFR
BUN: 16 mg/dL (ref 7–25)
CO2: 28 mmol/L (ref 20–32)
Calcium: 8.8 mg/dL (ref 8.6–10.4)
Chloride: 106 mmol/L (ref 98–110)
Creat: 0.75 mg/dL (ref 0.50–0.99)
GFR, Est African American: 98 mL/min/{1.73_m2} (ref >=60)
GFR, Est Non African American: 84 mL/min/{1.73_m2} (ref >=60)
Glucose, Bld: 103 mg/dL — ABNORMAL HIGH (ref 65–99)
Potassium: 4.2 mmol/L (ref 3.5–5.3)
Sodium: 141 mmol/L (ref 135–146)

## 2021-01-20 MED ORDER — DESVENLAFAXINE SUCCINATE ER 100 MG PO TB24: 100.0000 mg | ORAL_TABLET | Freq: Every day | ORAL | 3 refills | Status: DC

## 2021-01-24 ENCOUNTER — Telehealth: Payer: Self-pay | Admitting: *Deleted

## 2021-01-24 MED ORDER — DESVENLAFAXINE SUCCINATE ER 100 MG PO TB24: 100.0000 mg | ORAL_TABLET | Freq: Every day | ORAL | 3 refills | Status: DC

## 2021-01-24 NOTE — Telephone Encounter (Signed)
Pt called [facility] crying saying that her [employer] patient assistance was incorrect and that they needed to have Part A of the application.

## 2021-01-24 NOTE — Telephone Encounter (Signed)
Called pt and informed her that I reached out to [employer] and they are going to  Send part A of the application.   In the meantime I told her that Dr. Madilyn Fireman can send in either the pristiq or effexor for her until she is able to get her medication  She said that either medication is fine. She just needs something until she is able to get the medication from [employer].

## 2021-01-24 NOTE — Telephone Encounter (Signed)
Rx sent to pharmacy at [facility].  It looks like a 1 month supply is $21 at [employer] using the good Rx coupon for 30 tabs of the 100 mg dose.

## 2021-02-14 ENCOUNTER — Ambulatory Visit: Payer: Medicare Other | Admitting: Family Medicine

## 2021-02-25 ENCOUNTER — Telehealth: Payer: Self-pay | Admitting: *Deleted

## 2021-02-25 NOTE — Telephone Encounter (Signed)
Pt advised that Pristiq is up front for p/u. She has an appt on 5/12 and will get it at that time.

## 2021-03-03 ENCOUNTER — Encounter: Payer: Self-pay | Admitting: Family Medicine

## 2021-03-03 ENCOUNTER — Ambulatory Visit (INDEPENDENT_AMBULATORY_CARE_PROVIDER_SITE_OTHER): Payer: Medicare (Managed Care) | Admitting: Family Medicine

## 2021-03-03 ENCOUNTER — Other Ambulatory Visit: Payer: Self-pay

## 2021-03-03 ENCOUNTER — Other Ambulatory Visit (HOSPITAL_COMMUNITY)
Admission: RE | Admit: 2021-03-03 | Discharge: 2021-03-03 | Disposition: A | Discharge status: Home / Self Care | Payer: Medicare (Managed Care) | Source: Ambulatory Visit | Attending: Family Medicine | Admitting: Family Medicine

## 2021-03-03 VITALS — BP 132/64 | HR 56 | Ht 67.0 in | Wt 184.0 lb

## 2021-03-03 DIAGNOSIS — Z9884 Bariatric surgery status: Secondary | ICD-10-CM

## 2021-03-03 DIAGNOSIS — Z13220 Encounter for screening for lipoid disorders: Secondary | ICD-10-CM

## 2021-03-03 DIAGNOSIS — Z0000 Encounter for general adult medical examination without abnormal findings: Secondary | ICD-10-CM | POA: Diagnosis not present

## 2021-03-03 DIAGNOSIS — Z01419 Encounter for gynecological examination (general) (routine) without abnormal findings: Secondary | ICD-10-CM | POA: Insufficient documentation

## 2021-03-03 DIAGNOSIS — Z1151 Encounter for screening for human papillomavirus (HPV): Secondary | ICD-10-CM | POA: Diagnosis not present

## 2021-03-03 DIAGNOSIS — Z124 Encounter for screening for malignant neoplasm of cervix: Secondary | ICD-10-CM | POA: Insufficient documentation

## 2021-03-03 NOTE — Patient Instructions (Signed)

## 2021-03-03 NOTE — Progress Notes (Signed)
Subjective:     Theresa Scott is a 65 y.o. female and is here for a comprehensive physical exam. The patient reports no problems.  Social History   Socioeconomic History  . Marital status: Married    Spouse name: Mateo Flow  . Number of children: 4  . Years of education: 27  . Highest education level: GED or equivalent  Occupational History  . Occupation:  indutries    Comment: retired  Tobacco Use  . Smoking status: Former Smoker    Packs/day: 0.50    Years: 18.00    Pack years: 9.00    Quit date: 08/04/1990    Years since quitting: 30.6  . Smokeless tobacco: Never Used  Vaping Use  . Vaping Use: Never used  Substance and Sexual Activity  . Alcohol use: No  . Drug use: No  . Sexual activity: Yes  Other Topics Concern  . Not on file  Social History Narrative   Married 1 daughter (59) and 3 adopted children (69, 3, 14 months)   Disabled, chronic back pain, former [place] her.   No alcohol or caffeine tobacco   Social Determinants of Health   Financial Resource Strain: Low Risk   . Difficulty of Paying Living Expenses: Not hard at all  Food Insecurity: No Food Insecurity  . Worried About Charity fundraiser in the Last Year: Never true  . Ran Out of Food in the Last Year: Never true  Transportation Needs: No Transportation Needs  . Lack of Transportation (Medical): No  . Lack of Transportation (Non-Medical): No  Physical Activity: Inactive  . Days of Exercise per Week: 0 days  . Minutes of Exercise per Session: 0 min  Stress: No Stress Concern Present  . Feeling of Stress : Not at all  Social Connections: Moderately Integrated  . Frequency of Communication with Friends and Family: More than three times a week  . Frequency of Social Gatherings with Friends and Family: Once a week  . Attends Religious Services: More than 4 times per year  . Active Member of Clubs or Organizations: No  . Attends Archivist Meetings: Never   . Marital Status: Married  Human resources officer Violence: Not At Risk  . Fear of Current or Ex-Partner: No  . Emotionally Abused: No  . Physically Abused: No  . Sexually Abused: No   Health Maintenance  Topic Date Due  . URINE MICROALBUMIN  11/28/2019  . COVID-19 Vaccine (3 - Booster for Pfizer series) 12/01/2020  . PAP SMEAR-Modifier  12/16/2020  . FOOT EXAM  01/19/2022 (Originally 11/28/2019)  . OPHTHALMOLOGY EXAM  03/09/2021  . INFLUENZA VACCINE  05/23/2021  . DEXA SCAN  07/08/2021  . HEMOGLOBIN A1C  07/22/2021  . MAMMOGRAM  08/12/2022  . TETANUS/TDAP  11/18/2025  . COLONOSCOPY (Pts 45-55yrs Insurance coverage will need to be confirmed)  12/21/2027  . Hepatitis C Screening  Completed  . HIV Screening  Completed  . HPV VACCINES  Aged Out    The following portions of the patient's history were reviewed and updated as appropriate: allergies, current medications, past family history, past medical history, past social history, past surgical history and problem list.  Review of Systems A comprehensive review of systems was negative.   Objective:    BP 132/64   Pulse (!) 56   Ht 5\' 7"  (1.702 m)   Wt 184 lb (83.5 kg)   BMI 28.82 kg/m  General appearance: alert, cooperative and appears stated age Head:  Normocephalic, without obvious abnormality, atraumatic Eyes: conj clear, EOMI, PEERLA Ears: normal TM's and external ear canals both ears Nose: Nares normal. Septum midline. Mucosa normal. No drainage or sinus tenderness. Throat: lips, mucosa, and tongue normal; teeth and gums normal Neck: no adenopathy, no carotid bruit, no JVD, supple, symmetrical, trachea midline and thyroid not enlarged, symmetric, no tenderness/mass/nodules Back: symmetric, no curvature. ROM normal. No CVA tenderness. Lungs: clear to auscultation bilaterally Breasts: normal appearance, no masses or tenderness Heart: regular rate and rhythm, S1, S2 normal, no murmur, click, rub or gallop Abdomen: soft,  non-tender; bowel sounds normal; no masses,  no organomegaly Pelvic: cervix normal in appearance, external genitalia normal, no adnexal masses or tenderness, no cervical motion tenderness, rectovaginal septum normal, uterus normal size, shape, and consistency and vagina normal without discharge Extremities: extremities normal, atraumatic, no cyanosis or edema Pulses: 2+ and symmetric Skin: Skin color, texture, turgor normal. No rashes or lesions Lymph nodes: Cervical, supraclavicular, and axillary nodes normal. Neurologic: Alert and oriented X 3, normal strength and tone. Normal symmetric reflexes. Normal coordination and gait    Assessment:    Healthy female exam.    Keep up a regular exercise program and make sure you are eating a healthy diet Try to eat 4 servings of dairy a day, or if you are lactose intolerant take a calcium with vitamin D daily.  Your vaccines are up to date.     Plan:     See After Visit Summary for Counseling Recommendations   Keep up a regular exercise program and make sure you are eating a healthy diet Try to eat 4 servings of dairy a day, or if you are lactose intolerant take a calcium with vitamin D daily.  Your vaccines are up to date.  Labs ordered.   Varicose veins - encouraged her to wear her compression stocking.

## 2021-03-04 LAB — LIPID PANEL W/REFLEX DIRECT LDL
Cholesterol: 70 mg/dL (ref <200)
HDL: 27 mg/dL — ABNORMAL LOW (ref >=50)
LDL Cholesterol (Calc): 30 mg/dL (calc)
Non-HDL Cholesterol (Calc): 43 mg/dL (calc) (ref <130)
Total CHOL/HDL Ratio: 2.6 (calc) (ref <5.0)
Triglycerides: 58 mg/dL (ref <150)

## 2021-03-04 LAB — VITAMIN B12: Vitamin B-12: 821 pg/mL (ref 200–1100)

## 2021-03-04 LAB — IRON: Iron: 81 ug/dL (ref 45–160)

## 2021-03-04 NOTE — Progress Notes (Signed)
All labs are normal. 

## 2021-03-07 LAB — CYTOLOGY - PAP
Comment: NEGATIVE
Diagnosis: NEGATIVE
High risk HPV: NEGATIVE

## 2021-03-08 NOTE — Progress Notes (Signed)
Call patient: Your Pap smear is normal. Repeat in 5 years.

## 2021-05-25 ENCOUNTER — Other Ambulatory Visit: Payer: Self-pay

## 2021-05-25 MED ORDER — DESVENLAFAXINE SUCCINATE ER 100 MG PO TB24: 100.0000 mg | ORAL_TABLET | Freq: Every day | ORAL | 2 refills | Status: DC

## 2021-05-26 ENCOUNTER — Other Ambulatory Visit: Payer: Self-pay | Admitting: Family Medicine

## 2021-05-27 ENCOUNTER — Other Ambulatory Visit: Payer: Self-pay

## 2021-05-27 NOTE — Telephone Encounter (Signed)
Pt called stating that she needs refill of Prestiq from patient assistance program.  [name] patient assistance program and completed request thru automated system.  Order ID [id number].  Pt informed.  Charyl Bigger, CMA

## 2021-07-08 ENCOUNTER — Emergency Department (HOSPITAL_BASED_OUTPATIENT_CLINIC_OR_DEPARTMENT_OTHER): Payer: Medicare Other

## 2021-07-08 ENCOUNTER — Other Ambulatory Visit: Payer: Self-pay

## 2021-07-08 ENCOUNTER — Encounter (HOSPITAL_BASED_OUTPATIENT_CLINIC_OR_DEPARTMENT_OTHER): Payer: Self-pay | Admitting: *Deleted

## 2021-07-08 ENCOUNTER — Emergency Department (HOSPITAL_BASED_OUTPATIENT_CLINIC_OR_DEPARTMENT_OTHER)
Admission: EM | Admit: 2021-07-08 | Discharge: 2021-07-08 | Disposition: A | Discharge status: Home / Self Care | Payer: Medicare Other | Attending: Emergency Medicine | Admitting: Emergency Medicine

## 2021-07-08 DIAGNOSIS — Z7984 Long term (current) use of oral hypoglycemic drugs: Secondary | ICD-10-CM | POA: Diagnosis not present

## 2021-07-08 DIAGNOSIS — S199XXA Unspecified injury of neck, initial encounter: Secondary | ICD-10-CM | POA: Diagnosis not present

## 2021-07-08 DIAGNOSIS — Z91040 Latex allergy status: Secondary | ICD-10-CM | POA: Diagnosis not present

## 2021-07-08 DIAGNOSIS — I639 Cerebral infarction, unspecified: Secondary | ICD-10-CM | POA: Diagnosis not present

## 2021-07-08 DIAGNOSIS — N189 Chronic kidney disease, unspecified: Secondary | ICD-10-CM | POA: Diagnosis not present

## 2021-07-08 DIAGNOSIS — S0083XA Contusion of other part of head, initial encounter: Secondary | ICD-10-CM | POA: Diagnosis not present

## 2021-07-08 DIAGNOSIS — S6992XA Unspecified injury of left wrist, hand and finger(s), initial encounter: Secondary | ICD-10-CM | POA: Diagnosis present

## 2021-07-08 DIAGNOSIS — S0990XA Unspecified injury of head, initial encounter: Secondary | ICD-10-CM | POA: Diagnosis not present

## 2021-07-08 DIAGNOSIS — I129 Hypertensive chronic kidney disease with stage 1 through stage 4 chronic kidney disease, or unspecified chronic kidney disease: Secondary | ICD-10-CM | POA: Diagnosis not present

## 2021-07-08 DIAGNOSIS — S52502A Unspecified fracture of the lower end of left radius, initial encounter for closed fracture: Secondary | ICD-10-CM | POA: Diagnosis not present

## 2021-07-08 DIAGNOSIS — E1122 Type 2 diabetes mellitus with diabetic chronic kidney disease: Secondary | ICD-10-CM | POA: Insufficient documentation

## 2021-07-08 DIAGNOSIS — M7989 Other specified soft tissue disorders: Secondary | ICD-10-CM | POA: Diagnosis not present

## 2021-07-08 DIAGNOSIS — Z79899 Other long term (current) drug therapy: Secondary | ICD-10-CM | POA: Insufficient documentation

## 2021-07-08 DIAGNOSIS — Y9389 Activity, other specified: Secondary | ICD-10-CM | POA: Diagnosis not present

## 2021-07-08 DIAGNOSIS — M542 Cervicalgia: Secondary | ICD-10-CM | POA: Diagnosis not present

## 2021-07-08 DIAGNOSIS — I6521 Occlusion and stenosis of right carotid artery: Secondary | ICD-10-CM | POA: Diagnosis not present

## 2021-07-08 DIAGNOSIS — Z87891 Personal history of nicotine dependence: Secondary | ICD-10-CM | POA: Diagnosis not present

## 2021-07-08 DIAGNOSIS — W208XXA Other cause of strike by thrown, projected or falling object, initial encounter: Secondary | ICD-10-CM | POA: Insufficient documentation

## 2021-07-08 DIAGNOSIS — R9082 White matter disease, unspecified: Secondary | ICD-10-CM | POA: Diagnosis not present

## 2021-07-08 MED ORDER — ONDANSETRON 4 MG PO TBDP
4.0000 mg | ORAL_TABLET | Freq: Once | ORAL | Status: AC
  Administered 2021-07-08: 4 mg via ORAL
  Filled 2021-07-08: qty 1

## 2021-07-08 MED ORDER — HYDROMORPHONE HCL 1 MG/ML IJ SOLN
1.0000 mg | Freq: Once | INTRAMUSCULAR | Status: AC
  Administered 2021-07-08: 1 mg via INTRAMUSCULAR
  Filled 2021-07-08: qty 1

## 2021-07-08 NOTE — ED Notes (Signed)
Pt provided cup of ice and graham crackers, as requested.  No further needs expressed.

## 2021-07-08 NOTE — ED Provider Notes (Signed)
[facility] Provider Note   CSN: [id number] Arrival date & time: 07/08/21  1637     History Chief Complaint  Patient presents with   Theresa Scott is a 65 y.o. female.  Patient presents emergency department for evaluation of head injury and left wrist pain.  Patient has a history of chronic pain and takes oxycodone daily.  She states that she was helping to unload a truck when a pallet fell and struck the top of her head.  This caused her to fall over.  Pain was acute, but not severe and family wrapped it at home.  However pain worsened and became severe prompting emergency department visit.  Incident occurred around 11:30 AM.  She did not lose consciousness.  She continues to have headache but is not confused.  No vomiting or vision changes.  She has some pain that goes down into her neck but denies weakness, numbness, or tingling in her arms or her legs.  She is able to ambulate normally.  She took her home pain medication without improvement.      Past Medical History:  Diagnosis Date   Chronic kidney disease    Complication of anesthesia    hard to wake up and swelling after kidney stone removal in 1980 per pt   Degenerative disc disease    Depression    Diabetes mellitus    Fatty liver disease, nonalcoholic    Gallstones    GERD (gastroesophageal reflux disease)    Hypercholesterolemia    Hypertension    Kidney stones    Obesity    Osteoporosis    Pancreatitis     Patient Active Problem List   Diagnosis Date Noted   Major depressive disorder, single episode, severe without psychotic features ([place]) 11/03/2019   Osteoporosis 06/05/2019   NAFLD (nonalcoholic fatty liver disease) 12/04/2016   Liver fibrosis ([place]) - suspected 12/04/2016   Aortic calcification ([place]) 04/20/2016   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 11/03/2011   IMPAIRED FASTING GLUCOSE 12/15/2010   ANXIETY 01/31/2010   PSVT 12/01/2009   STASIS DERMATITIS 04/06/2009   ALLERGIC RHINITIS 08/19/2008   Hyperlipemia 06/18/2008   OBESITY 06/18/2008   Peripheral vascular disease, unspecified ([place]) 06/18/2008   GERD 06/18/2008   OSTEOARTHRITIS 06/18/2008   INCONTINENCE 06/18/2008   PROTEINURIA 06/18/2008   NEPHROLITHIASIS, HX OF 06/18/2008    Past Surgical History:  Procedure Laterality Date   BACK SURGERY  1990,2003,2004,2010   CARDIAC CATHETERIZATION  2011   [facility].    CARDIAC SURGERY  2002   ablation   CHOLECYSTECTOMY  1976   COLONOSCOPY  2008   ESOPHAGOGASTRODUODENOSCOPY N/A 01/27/2013   Procedure: ESOPHAGOGASTRODUODENOSCOPY (EGD) upper endoscopy;  Surgeon: Shann Medal, MD;  Location: [facility];  Service: General;  Laterality: N/A;   GASTRIC BYPASS  08/22/10   HAND SURGERY  2000   right   SUBTHALAMIC STIMULATOR BATTERY REPLACEMENT  08/16/2020   TONSILLECTOMY  1971     OB History   No obstetric history on file.     Family History  Problem Relation Age of Onset   Lung cancer Mother    Alcohol abuse Mother    Colon cancer Brother        late 11's   Liver cancer Brother    Alcohol abuse Brother    Cirrhosis Sister  secondary medication   Diabetes Sister    Kidney failure Brother        Dialysis at 70, born single kidney   Esophageal cancer Neg Hx    Stomach cancer Neg Hx    Rectal cancer Neg Hx     Social History   Tobacco Use   Smoking status: Former    Packs/day: 0.50    Years: 18.00    Pack years: 9.00    Types: Cigarettes    Quit date: 08/04/1990    Years since quitting: 30.9   Smokeless tobacco: Never  Vaping Use   Vaping Use: Never used  Substance Use Topics   Alcohol use: No   Drug use: No    Home Medications Prior to Admission medications   Medication Sig Start Date End Date Taking? Authorizing Provider  ACCU-CHEK FASTCLIX LANCETS MISC For testing  blood sugars 1-2 times daily. DX: R73.01 04/11/18   Hali Marry, MD  ACCU-CHEK SMARTVIEW test strip TEST BLOOD SUGAR TWICE DAILY 11/18/19   Hali Marry, MD  Alcohol Swabs (B-D SINGLE USE SWABS REGULAR) PADS USE TOPICALLY  1  TO  2  TIMES  DAILY AS DIRECTED 11/18/19   Hali Marry, MD  alendronate (FOSAMAX) 70 MG tablet TAKE 1 TABLET (70 MG TOTAL) BY MOUTH EVERY 7 DAYS. TAKE WITH A FULL GLASS OF WATER ON AN EMPTY STOMACH. 12/06/20   Hali Marry, MD  atorvastatin (LIPITOR) 20 MG tablet Take 1 tablet (20 mg total) by mouth daily. 12/06/20   Hali Marry, MD  baclofen (LIORESAL) 10 MG tablet daily. 10/05/15   [provider]  Blood Glucose Calibration (ACCU-CHEK SMARTVIEW CONTROL) LIQD To be used to calibrate glucose meter as needed. 07/05/17   Hali Marry, MD  Blood Glucose Monitoring Suppl (ACCU-CHEK NANO SMARTVIEW) w/Device KIT Check fasting blood sugars every morning and once after largest meal of the day a few x a week. DX:R73.01 07/10/17   Hali Marry, MD  cetirizine (ZYRTEC ALLERGY) 10 MG tablet Take 1 tablet (10 mg total) by mouth daily. Patient taking differently: Take 10 mg by mouth daily as needed. 01/08/18   Law, Bea Graff, PA-C  Cyanocobalamin (VITAMIN B-12 PO) Take 1 tablet by mouth every Wednesday. Patient not taking: Reported on 01/19/2021    [provider]  desvenlafaxine (PRISTIQ) 100 MG 24 hr tablet Take 1 tablet (100 mg total) by mouth daily. 05/25/21   Hali Marry, MD  Docusate Sodium (COLACE PO)  06/21/16   [provider]  EPINEPHrine 0.3 mg/0.3 mL IJ SOAJ injection Inject 0.3 mLs (0.3 mg total) into the muscle once. Patient not taking: Reported on 01/19/2021 12/17/15   Hali Marry, MD  fluticasone [facility]) 50 MCG/ACT nasal spray Place 2 sprays into both nostrils daily. 04/17/20   Tasia Catchings, Amy V, PA-C  gabapentin (NEURONTIN) 300 MG capsule Take 2 capsules (600 mg total) by mouth  daily. 12/06/20   Hali Marry, MD  metFORMIN (GLUCOPHAGE-XR) 500 MG 24 hr tablet Take 1 tablet (500 mg total) by mouth daily with breakfast. 12/06/20   Hali Marry, MD  metoprolol succinate (TOPROL-XL) 25 MG 24 hr tablet TAKE 1 TABLET DAILY 05/31/21   Hali Marry, MD  omeprazole (PRILOSEC) 40 MG capsule TAKE 1 CAPSULE EVERY DAY 11/18/19   Hali Marry, MD  ondansetron (ZOFRAN-ODT) 4 MG disintegrating tablet DISSOLVE 1 TABLET ON THE TONGUE EVERY 8 HOURS AS NEEDED FOR NAUSEA AND VOMITING 12/06/20  Hali Marry, MD  Oxycodone HCl 10 MG TABS Take 10 mg by mouth daily. 03/14/19   [provider]    Allergies    Bee venom, Loratadine, Nsaids, and Latex  Review of Systems   Review of Systems  Constitutional:  Negative for activity change and fatigue.  HENT:  Negative for tinnitus.   Eyes:  Negative for photophobia, pain and visual disturbance.  Respiratory:  Negative for shortness of breath.   Cardiovascular:  Negative for chest pain.  Gastrointestinal:  Negative for nausea and vomiting.  Musculoskeletal:  Positive for arthralgias, joint swelling and neck pain. Negative for back pain and gait problem.  Skin:  Negative for wound.  Neurological:  Positive for headaches. Negative for dizziness, weakness, light-headedness and numbness.  Psychiatric/Behavioral:  Negative for confusion and decreased concentration.    Physical Exam Updated Vital Signs BP (!) 172/93 (BP Location: Right Arm) Comment: RN notified  Pulse 70   Temp 98.2 F (36.8 C) (Oral)   Resp 18   Ht _0  (1.702 m)   Wt 83.5 kg   SpO2 97%   BMI 28.83 kg/m   Physical Exam Vitals and nursing note reviewed.  Constitutional:      Appearance: She is well-developed.  HENT:     Head: Normocephalic. No raccoon eyes or Battle's sign.     Comments: Minimal hematoma on the crown of the head.  No depressions or deformities.    Right Ear: Tympanic membrane, ear canal and external ear  normal. No hemotympanum.     Left Ear: Tympanic membrane, ear canal and external ear normal. No hemotympanum.     Nose: Nose normal.     Mouth/Throat:     Pharynx: Uvula midline.  Eyes:     General: Lids are normal.     Extraocular Movements:     Right eye: No nystagmus.     Left eye: No nystagmus.     Conjunctiva/sclera: Conjunctivae normal.     Pupils: Pupils are equal, round, and reactive to light.     Comments: No visible hyphema noted  Cardiovascular:     Rate and Rhythm: Normal rate and regular rhythm.     Pulses: Normal pulses. No decreased pulses.  Pulmonary:     Effort: Pulmonary effort is normal.     Breath sounds: Normal breath sounds.  Abdominal:     Palpations: Abdomen is soft.     Tenderness: There is no abdominal tenderness.  Musculoskeletal:        General: Tenderness present.     Left shoulder: No tenderness. Normal range of motion.     Left elbow: Normal range of motion. No tenderness.     Left forearm: No tenderness.     Left wrist: Swelling and bony tenderness present. Decreased range of motion.     Cervical back: Normal range of motion and neck supple. Tenderness present. No bony tenderness.     Thoracic back: No tenderness or bony tenderness.     Lumbar back: No tenderness or bony tenderness.     Comments: Compartments of the left forearm are soft.  Cap refill 2 seconds in all digits.  2+ radial pulse.  Skin:    General: Skin is warm and dry.  Neurological:     Mental Status: She is alert and oriented to person, place, and time.     GCS: GCS eye subscore is 4. GCS verbal subscore is 5. GCS motor subscore is 6.     Cranial Nerves:  No cranial nerve deficit.     Sensory: No sensory deficit.     Coordination: Coordination normal.     Comments: Motor, sensation, and vascular distal to the injury is fully intact.   Psychiatric:        Mood and Affect: Mood normal.    ED Results / Procedures / Treatments   Labs (all labs ordered are listed, but only  abnormal results are displayed) Labs Reviewed - No data to display  EKG None  Radiology DG Wrist Complete Left  Result Date: 07/08/2021 CLINICAL DATA:  Fall with wrist injury EXAM: LEFT WRIST - COMPLETE 3+ VIEW COMPARISON:  01/07/2017 FINDINGS: Subtle acute nondisplaced distal radius fracture, best seen on the lateral view, with probable articular surface extension. No malalignment. Positive for soft tissue swelling IMPRESSION: Acute nondisplaced distal radius fracture Electronically Signed   By: Donavan Foil M.D.   On: 07/08/2021 18:17   CT Head Wo Contrast  Result Date: 07/08/2021 CLINICAL DATA:  A Pallet fell striking the top patient's head, knocking her down. EXAM: CT HEAD WITHOUT CONTRAST TECHNIQUE: Contiguous axial images were obtained from the base of the skull through the vertex without intravenous contrast. COMPARISON:  07/30/2012 MRI brain FINDINGS: Brain: Stable small remote lacunar infarct of the left thalamus on image 17 series 2. Periventricular white matter and corona radiata hypodensities favor chronic ischemic microvascular white matter disease. Otherwise, the brainstem, cerebellum, cerebral peduncles, thalamus, basal ganglia, basilar cisterns, and ventricular system appear within normal limits. No intracranial hemorrhage, mass lesion, or acute CVA. Vascular: Unremarkable Skull: Unremarkable Sinuses/Orbits: Mild chronic ethmoid sinusitis. Other: Mild scalp soft tissue swelling along the right vertex shown for example on image 19 series 4. IMPRESSION: 1. No acute intracranial findings. There is some mild scalp soft tissue swelling along the right vertex. 2. Periventricular white matter and corona radiata hypodensities favor chronic ischemic microvascular white matter disease. 3. 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 4. Mild chronic ethmoid sinusitis. Electronically Signed   By: Van Clines M.D.   On: 07/08/2021 21:22   CT  Cervical Spine Wo Contrast  Result Date: 07/08/2021 CLINICAL DATA:  A Pallet fell and struck the patient's head, knocking her down. EXAM: CT CERVICAL SPINE WITHOUT CONTRAST TECHNIQUE: Multidetector CT imaging of the cervical spine was performed without intravenous contrast. Multiplanar CT image reconstructions were also generated. COMPARISON:  None. FINDINGS: Alignment: No vertebral subluxation is observed. Skull base and vertebrae: A 0.9 cm lucency with rim sclerosis in the left side of the T1 vertebral body near the base of the pedicle is not substantially changed from 01/24/2010 and accordingly is considered benign. There are couple of other small lucencies in the cervical spine including at the base of the dens anteriorly on image 34 series 6; posteriorly in the C5 vertebral body on image 35 series 6; and eccentric to the right in the C4 vertebral body on image 29 series 6. These are nonspecific but have a substantial amount of low density including some fatty density, and are probably benign lesions such as hemangiomas. No cervical spine fracture or acute bony findings identified. Soft tissues and spinal canal: Mild atherosclerotic calcification of the right common carotid artery. Disc levels:  No substantial bony impingement is identified. Upper chest: Unremarkable Other: No supplemental non-categorized findings. IMPRESSION: 1. No acute cervical spine fracture or subluxation. 2. Several small lucent lesions in the cervical spine are likely benign as noted above. 3. Mild right common carotid  atherosclerotic calcification. Electronically Signed   By: Van Clines M.D.   On: 07/08/2021 21:29    Procedures Procedures   Medications Ordered in ED Medications  HYDROmorphone (DILAUDID) injection 1 mg (1 mg Intramuscular Given 07/08/21 2058)  ondansetron (ZOFRAN-ODT) disintegrating tablet 4 mg (4 mg Oral Given 07/08/21 2056)    ED Course  I have reviewed the triage vital signs and the nursing  notes.  Pertinent labs & imaging results that were available during my care of the patient were reviewed by me and considered in my medical decision making (see chart for details).  Patient seen and examined.  X-ray ordered at triage demonstrates nondisplaced distal radius fracture.  Patient continues to have a headache and some neck pain.  Discussed imaging with patient and she agrees to proceed.  She is not anticoagulated.  Gross normal neurologic exam.  No signs of developing compartment syndrome in the left forearm.  Vital signs reviewed and are as follows: BP (!) 172/93 (BP Location: Right Arm) Comment: RN notified  Pulse 70   Temp 98.2 F (36.8 C) (Oral)   Resp 18   Ht _0  (1.702 m)   Wt 83.5 kg   SpO2 97%   BMI 28.83 kg/m   10:09 PM patient placed in short arm splint.  Distal CMS unchanged.  Pain is now better controlled after administration of IM pain medications.  She is comfortable and agreeable to discharge to home.  We discussed reassuring head and neck CT results.  She has home medication to take and an orthopedic doctor who she will call on Monday to schedule follow-up appointment.  Discussed that she will likely need to be in a cast.  Patient was counseled on head injury precautions and symptoms that should indicate their return to the ED.  These include severe worsening headache, vision changes, confusion, loss of consciousness, trouble walking, nausea & vomiting, or weakness/tingling in extremities.     MDM Rules/Calculators/A&P                           Head injury: No neurologic decompensation since injury 10 hours ago.  CT of the head and C-spine were negative.  Wrist pain and swelling: Patient with distal radius fracture that is nondisplaced.  Upper extremity is neurovascularly intact without signs of compartment syndrome.  She has appropriate orthopedic follow-up.  Patient placed in a splint.    Final Clinical Impression(s) / ED Diagnoses Final diagnoses:   Closed fracture of distal end of left radius, unspecified fracture morphology, initial encounter  Injury of head, initial encounter    Rx / DC Orders ED Discharge Orders     None        Carlisle Cater, PA-C 07/08/21 2210    Davonna Belling, MD 07/09/21 0004

## 2021-07-08 NOTE — ED Notes (Signed)
Pt not ready for CT.

## 2021-07-08 NOTE — ED Notes (Signed)
D/c paperwork reviewed with pt including f/u care. Pt with no questions or concerns, verbalized understanding. Pt ambulatory to ED exit per her request.

## 2021-07-08 NOTE — ED Triage Notes (Addendum)
Head and left wrist injury. She was helping unload a pallet and it fell hitting her in the top of the head knocking her down causing her left wrist to hit the gravel. No loc. She does not take blood thinners. She took a Ambulance person and an hour ago.

## 2021-07-08 NOTE — Discharge Instructions (Signed)
Please read and follow all provided instructions.  Your diagnoses today include:  1. Closed fracture of distal end of left radius, unspecified fracture morphology, initial encounter   2. Injury of head, initial encounter     Tests performed today include: CT scan of your head and neck that did not show any serious injury X-ray of your wrist -shows a broken distal radius bone Vital signs. See below for your results today.   Medications prescribed:  None  Take any prescribed medications only as directed.  Home care instructions:  Follow any educational materials contained in this packet.  BE VERY CAREFUL not to take multiple medicines containing Tylenol (also called acetaminophen). Doing so can lead to an overdose which can damage your liver and cause liver failure and possibly death.   Follow-up instructions: Call your orthopedic doctor on Monday to schedule follow-up appointment in regards to your broken wrist.   Return instructions:  SEEK IMMEDIATE MEDICAL ATTENTION IF: There is confusion or drowsiness (although children frequently become drowsy after injury).  You cannot awaken the injured person.  You have more than one episode of vomiting.  You notice dizziness or unsteadiness which is getting worse, or inability to walk.  You have convulsions or unconsciousness.  You experience severe, persistent headaches not relieved by Tylenol. You cannot use arms or legs normally.  There are changes in pupil sizes. (This is the black center in the colored part of the eye)  There is clear or bloody discharge from the nose or ears.  You have change in speech, vision, swallowing, or understanding.  Localized weakness, numbness, tingling, or change in bowel or bladder control. You have any other emergent concerns.  Additional Information: You have had a head injury which does not appear to require admission at this time.  Your vital signs today were: BP (!) 158/92   Pulse 66   Temp  98.2 F (36.8 C) (Oral)   Resp 19   Ht '5\' 7"'$  (1.702 m)   Wt 83.5 kg   SpO2 95%   BMI 28.83 kg/m  If your blood pressure (BP) was elevated above 135/85 this visit, please have this repeated by your doctor within one month. --------------

## 2021-07-11 ENCOUNTER — Telehealth: Payer: Self-pay | Admitting: General Practice

## 2021-07-11 NOTE — Telephone Encounter (Signed)
Transition Care Management Follow-up Telephone Call Date of discharge and from where: 07/08/21 from [facility] How have you been since you were released from the hospital? Doing ok. She has broken her wrist and is on pain medication. She is following up with the orthopedic tomorrow. Any questions or concerns? No  Items Reviewed: Did the pt receive and understand the discharge instructions provided? Yes  Medications obtained and verified? Yes  Other? No  Any new allergies since your discharge? No  Dietary orders reviewed? Yes Do you have support at home? Yes   Home Care and Equipment/Supplies: Were home health services ordered? no   Functional Questionnaire: (I = Independent and D = Dependent) ADLs: I  Bathing/Dressing- I  Meal Prep- I  Eating- I  Maintaining continence- I  Transferring/Ambulation- I  Managing Meds- I  Follow up appointments reviewed:  PCP Hospital f/u appt confirmed? No  Was told to follow up with orthopedic at this time. [facility] f/u appt confirmed? Yes  Scheduled to see the orthopedic on 07/12/21. Are transportation arrangements needed? No  If their condition worsens, is the pt aware to call PCP or go to the Emergency Dept.? Yes Was the patient provided with contact information for the PCP's office or ED? Yes Was to pt encouraged to call back with questions or concerns? Yes

## 2021-07-12 DIAGNOSIS — M4326 Fusion of spine, lumbar region: Secondary | ICD-10-CM | POA: Diagnosis not present

## 2021-07-12 DIAGNOSIS — Z6833 Body mass index (BMI) 33.0-33.9, adult: Secondary | ICD-10-CM | POA: Diagnosis not present

## 2021-07-12 DIAGNOSIS — G894 Chronic pain syndrome: Secondary | ICD-10-CM | POA: Diagnosis not present

## 2021-07-12 DIAGNOSIS — M25532 Pain in left wrist: Secondary | ICD-10-CM | POA: Diagnosis not present

## 2021-07-12 DIAGNOSIS — M545 Low back pain, unspecified: Secondary | ICD-10-CM | POA: Diagnosis not present

## 2021-07-20 ENCOUNTER — Ambulatory Visit (INDEPENDENT_AMBULATORY_CARE_PROVIDER_SITE_OTHER): Payer: Medicare Other

## 2021-07-20 ENCOUNTER — Other Ambulatory Visit: Payer: Self-pay

## 2021-07-20 DIAGNOSIS — Z780 Asymptomatic menopausal state: Secondary | ICD-10-CM | POA: Diagnosis not present

## 2021-07-20 DIAGNOSIS — M810 Age-related osteoporosis without current pathological fracture: Secondary | ICD-10-CM

## 2021-07-20 DIAGNOSIS — Z0000 Encounter for general adult medical examination without abnormal findings: Secondary | ICD-10-CM

## 2021-07-22 NOTE — Progress Notes (Signed)
Hi Theresa Scott, bone density test still shows osteoporosis.  T score was -2.8.  No significant change which is good.  The Fosamax is helping maintain current density.

## 2021-08-02 DIAGNOSIS — M25532 Pain in left wrist: Secondary | ICD-10-CM | POA: Diagnosis not present

## 2021-08-18 DIAGNOSIS — M25532 Pain in left wrist: Secondary | ICD-10-CM | POA: Diagnosis not present

## 2021-08-24 ENCOUNTER — Ambulatory Visit
Admission: EM | Admit: 2021-08-24 | Discharge: 2021-08-24 | Disposition: A | Discharge status: Home / Self Care | Payer: Medicare Other | Attending: Physician Assistant | Admitting: Physician Assistant

## 2021-08-24 ENCOUNTER — Other Ambulatory Visit: Payer: Self-pay

## 2021-08-24 ENCOUNTER — Encounter: Payer: Self-pay | Admitting: Emergency Medicine

## 2021-08-24 DIAGNOSIS — Z20822 Contact with and (suspected) exposure to covid-19: Secondary | ICD-10-CM

## 2021-08-24 NOTE — ED Triage Notes (Signed)
Patient c/o itchy throat x 1 day, exposure to COVID, fatigue, productive cough today.  Patient is vaccinated for COVID.

## 2021-08-24 NOTE — ED Provider Notes (Signed)
[facility]    CSN: [id number] Arrival date & time: 08/24/21  1726      History   Chief Complaint Chief Complaint  Patient presents with   Covid Exposure    HPI TAYLORANNE LEKAS is a 65 y.o. female.   Patient here today for evaluation of exposure to covid three days ago. She reports that she has developed cough, fatigue, and itchy throat today. She has not had fever. She does not report any  treatment for symptoms.   The history is provided by the patient.   Past Medical History:  Diagnosis Date   Chronic kidney disease    Complication of anesthesia    hard to wake up and swelling after kidney stone removal in 1980 per pt   Degenerative disc disease    Depression    Diabetes mellitus    Fatty liver disease, nonalcoholic    Gallstones    GERD (gastroesophageal reflux disease)    Hypercholesterolemia    Hypertension    Kidney stones    Obesity    Osteoporosis    Pancreatitis     Patient Active Problem List   Diagnosis Date Noted   Major depressive disorder, single episode, severe without psychotic features ([place]) 11/03/2019   Osteoporosis 06/05/2019   NAFLD (nonalcoholic fatty liver disease) 12/04/2016   Liver fibrosis ([place]) - suspected 12/04/2016   Aortic calcification ([place]) 04/20/2016   AVNRT (AV nodal re-entry tachycardia) ([place]) 08/18/2013   Daytime somnolence 06/26/2013   Sleep disorder, circadian 06/26/2013   History of gastric bypass, 08/22/2010. 11/03/2011   IMPAIRED FASTING GLUCOSE 12/15/2010   ANXIETY 01/31/2010   PSVT 12/01/2009   STASIS DERMATITIS 04/06/2009   ALLERGIC RHINITIS 08/19/2008   Hyperlipemia 06/18/2008   OBESITY 06/18/2008   Peripheral vascular disease, unspecified ([place]) 06/18/2008   GERD 06/18/2008   OSTEOARTHRITIS 06/18/2008   INCONTINENCE 06/18/2008   PROTEINURIA 06/18/2008   NEPHROLITHIASIS, HX OF 06/18/2008    Past Surgical History:  Procedure Laterality Date   BACK SURGERY  1990,2003,2004,2010   CARDIAC  CATHETERIZATION  2011   [facility].    CARDIAC SURGERY  2002   ablation   CHOLECYSTECTOMY  1976   COLONOSCOPY  2008   ESOPHAGOGASTRODUODENOSCOPY N/A 01/27/2013   Procedure: ESOPHAGOGASTRODUODENOSCOPY (EGD) upper endoscopy;  Surgeon: Shann Medal, MD;  Location: [facility];  Service: General;  Laterality: N/A;   GASTRIC BYPASS  08/22/10   HAND SURGERY  2000   right   SUBTHALAMIC STIMULATOR BATTERY REPLACEMENT  08/16/2020   TONSILLECTOMY  1971    OB History   No obstetric history on file.      Home Medications    Prior to Admission medications   Medication Sig Start Date End Date Taking? Authorizing Provider  ACCU-CHEK FASTCLIX LANCETS MISC For testing blood sugars 1-2 times daily. DX: R73.01 04/11/18  Yes Hali Marry, MD  ACCU-CHEK SMARTVIEW test strip TEST BLOOD SUGAR TWICE DAILY 11/18/19  Yes Hali Marry, MD  Alcohol Swabs (B-D SINGLE USE SWABS REGULAR) PADS USE TOPICALLY  1  TO  2  TIMES  DAILY AS DIRECTED 11/18/19  Yes Hali Marry, MD  alendronate (FOSAMAX) 70 MG tablet TAKE 1 TABLET (70 MG TOTAL) BY MOUTH EVERY 7 DAYS. TAKE WITH A FULL GLASS OF WATER ON AN EMPTY STOMACH. 12/06/20  Yes Hali Marry, MD  atorvastatin (LIPITOR) 20 MG tablet Take 1 tablet (20 mg total) by mouth daily. 12/06/20  Yes Hali Marry, MD  baclofen (LIORESAL) 10  MG tablet daily. 10/05/15  Yes [provider]  Blood Glucose Calibration (ACCU-CHEK SMARTVIEW CONTROL) LIQD To be used to calibrate glucose meter as needed. 07/05/17  Yes Hali Marry, MD  Blood Glucose Monitoring Suppl (ACCU-CHEK NANO SMARTVIEW) w/Device KIT Check fasting blood sugars every morning and once after largest meal of the day a few x a week. DX:R73.01 07/10/17  Yes Hali Marry, MD  cetirizine (ZYRTEC ALLERGY) 10 MG tablet Take 1 tablet (10 mg total) by mouth daily. Patient taking differently: Take 10 mg by mouth daily as needed. 01/08/18  Yes Law, Alexandra M,  PA-C  Cyanocobalamin (VITAMIN B-12 PO) Take 1 tablet by mouth every Wednesday.   Yes [provider]  desvenlafaxine (PRISTIQ) 100 MG 24 hr tablet Take 1 tablet (100 mg total) by mouth daily. 05/25/21  Yes Hali Marry, MD  Docusate Sodium (COLACE PO)  06/21/16  Yes [provider]  EPINEPHrine 0.3 mg/0.3 mL IJ SOAJ injection Inject 0.3 mLs (0.3 mg total) into the muscle once. 12/17/15  Yes Hali Marry, MD  fluticasone (FLONASE) 50 MCG/ACT nasal spray Place 2 sprays into both nostrils daily. 04/17/20  Yes Yu, Amy V, PA-C  gabapentin (NEURONTIN) 300 MG capsule Take 2 capsules (600 mg total) by mouth daily. 12/06/20  Yes Hali Marry, MD  metFORMIN (GLUCOPHAGE-XR) 500 MG 24 hr tablet Take 1 tablet (500 mg total) by mouth daily with breakfast. 12/06/20  Yes Hali Marry, MD  metoprolol succinate (TOPROL-XL) 25 MG 24 hr tablet TAKE 1 TABLET DAILY 05/31/21  Yes Hali Marry, MD  omeprazole (PRILOSEC) 40 MG capsule TAKE 1 CAPSULE EVERY DAY 11/18/19  Yes Hali Marry, MD  ondansetron (ZOFRAN-ODT) 4 MG disintegrating tablet DISSOLVE 1 TABLET ON THE TONGUE EVERY 8 HOURS AS NEEDED FOR NAUSEA AND VOMITING 12/06/20  Yes Hali Marry, MD  Oxycodone HCl 10 MG TABS Take 10 mg by mouth daily. 03/14/19  Yes [provider]    Family History Family History  Problem Relation Age of Onset   Lung cancer Mother    Alcohol abuse Mother    Colon cancer Brother        late 75's   Liver cancer Brother    Alcohol abuse Brother    Cirrhosis Sister        secondary medication   Diabetes Sister    Kidney failure Brother        Dialysis at 49, born single kidney   Esophageal cancer Neg Hx    Stomach cancer Neg Hx    Rectal cancer Neg Hx     Social History Social History   Tobacco Use   Smoking status: Former    Packs/day: 0.50    Years: 18.00    Pack years: 9.00    Types: Cigarettes    Quit date: 08/04/1990    Years since  quitting: 31.0   Smokeless tobacco: Never  Vaping Use   Vaping Use: Never used  Substance Use Topics   Alcohol use: No   Drug use: No     Allergies   Bee venom, Loratadine, Nsaids, and Latex   Review of Systems Review of Systems  Constitutional:  Negative for chills and fever.  HENT:  Positive for congestion, sinus pressure and sore throat. Negative for ear pain.   Eyes:  Negative for discharge and redness.  Respiratory:  Positive for cough. Negative for shortness of breath and wheezing.   Gastrointestinal:  Negative for abdominal pain, diarrhea,  nausea and vomiting.    Physical Exam Triage Vital Signs ED Triage Vitals  Enc Vitals Group     BP 08/24/21 1910 (!) 155/84     Pulse Rate 08/24/21 1910 66     Resp --      Temp 08/24/21 1910 97.9 F (36.6 C)     Temp Source 08/24/21 1910 Oral     SpO2 08/24/21 1910 96 %     Weight 08/24/21 1911 189 lb (85.7 kg)     Height 08/24/21 1911 '5\' 5"'  (1.651 m)     Head Circumference --      Peak Flow --      Pain Score 08/24/21 1911 7     Pain Loc --      Pain Edu? --      Excl. in [place]? --    No data found.  Updated Vital Signs BP (!) 155/84 (BP Location: Right Arm)   Pulse 66   Temp 97.9 F (36.6 C) (Oral)   Ht '5\' 5"'  (1.651 m)   Wt 189 lb (85.7 kg)   SpO2 96%   BMI 31.45 kg/m   Physical Exam Vitals and nursing note reviewed.  Constitutional:      General: She is not in acute distress.    Appearance: Normal appearance. She is not ill-appearing.  HENT:     Head: Normocephalic and atraumatic.     Nose: Congestion present.     Mouth/Throat:     Mouth: Mucous membranes are moist.     Pharynx: No oropharyngeal exudate or posterior oropharyngeal erythema.  Eyes:     Conjunctiva/sclera: Conjunctivae normal.  Cardiovascular:     Rate and Rhythm: Normal rate and regular rhythm.     Heart sounds: Normal heart sounds. No murmur heard. Pulmonary:     Effort: Pulmonary effort is normal. No respiratory distress.     Breath  sounds: Normal breath sounds. No wheezing, rhonchi or rales.  Skin:    General: Skin is warm and dry.  Neurological:     Mental Status: She is alert.  Psychiatric:        Mood and Affect: Mood normal.        Thought Content: Thought content normal.     UC Treatments / Results  Labs (all labs ordered are listed, but only abnormal results are displayed) Labs Reviewed  NOVEL CORONAVIRUS, NAA    EKG   Radiology No results found.  Procedures Procedures (including critical care time)  Medications Ordered in UC Medications - No data to display  Initial Impression / Assessment and Plan / UC Course  I have reviewed the triage vital signs and the nursing notes.  Pertinent labs & imaging results that were available during my care of the patient were reviewed by me and considered in my medical decision making (see chart for details).   Will order Covid screening for further evaluation. Symptoms do sound consistent with viral illness. Will await results for further recommendation.   Final Clinical Impressions(s) / UC Diagnoses   Final diagnoses:  Exposure to COVID-19 virus   Discharge Instructions   None    ED Prescriptions   None    PDMP not reviewed this encounter.   Francene Finders, PA-C 08/24/21 1954

## 2021-08-26 LAB — NOVEL CORONAVIRUS, NAA: SARS-CoV-2, NAA: NOT DETECTED

## 2021-08-26 LAB — SARS-COV-2, NAA 2 DAY TAT

## 2021-09-05 ENCOUNTER — Ambulatory Visit: Payer: Medicare (Managed Care) | Admitting: Family Medicine

## 2021-09-08 ENCOUNTER — Other Ambulatory Visit: Payer: Self-pay | Admitting: Family Medicine

## 2021-09-08 DIAGNOSIS — Z1231 Encounter for screening mammogram for malignant neoplasm of breast: Secondary | ICD-10-CM

## 2021-09-19 ENCOUNTER — Other Ambulatory Visit: Payer: Self-pay | Admitting: Family Medicine

## 2021-09-20 ENCOUNTER — Other Ambulatory Visit: Payer: Self-pay | Admitting: *Deleted

## 2021-09-20 ENCOUNTER — Ambulatory Visit: Payer: PRIVATE HEALTH INSURANCE | Admitting: Family Medicine

## 2021-09-21 ENCOUNTER — Other Ambulatory Visit: Payer: Self-pay

## 2021-09-21 ENCOUNTER — Ambulatory Visit (INDEPENDENT_AMBULATORY_CARE_PROVIDER_SITE_OTHER): Payer: Medicare Other

## 2021-09-21 DIAGNOSIS — Z1231 Encounter for screening mammogram for malignant neoplasm of breast: Secondary | ICD-10-CM

## 2021-09-22 NOTE — Progress Notes (Signed)
Please call patient. Normal mammogram.  Repeat in 1 year.  

## 2021-09-29 ENCOUNTER — Ambulatory Visit: Payer: PRIVATE HEALTH INSURANCE | Admitting: Family Medicine

## 2021-10-06 DIAGNOSIS — M4326 Fusion of spine, lumbar region: Secondary | ICD-10-CM | POA: Diagnosis not present

## 2021-10-06 DIAGNOSIS — G894 Chronic pain syndrome: Secondary | ICD-10-CM | POA: Diagnosis not present

## 2021-10-07 ENCOUNTER — Other Ambulatory Visit: Payer: Self-pay | Admitting: Family Medicine

## 2021-11-24 ENCOUNTER — Ambulatory Visit: Payer: Medicare Other | Admitting: Family Medicine

## 2021-11-24 NOTE — Progress Notes (Deleted)
Established Patient Office Visit  Subjective:  Patient ID: Theresa Scott, female    DOB: 01/22/1956  Age: 66 y.o. MRN: [id number]  CC: No chief complaint on file.   HPI Theresa Scott presents for   Follow-up depression.  Impaired fasting glucose-no increased thirst or urination. No symptoms consistent with hypoglycemia.  F/U Fatty liver disease.   Past Medical History:  Diagnosis Date   Chronic kidney disease    Complication of anesthesia    hard to wake up and swelling after kidney stone removal in 1980 per pt   Degenerative disc disease    Depression    Diabetes mellitus    Fatty liver disease, nonalcoholic    Gallstones    GERD (gastroesophageal reflux disease)    Hypercholesterolemia    Hypertension    Kidney stones    Obesity    Osteoporosis    Pancreatitis     Past Surgical History:  Procedure Laterality Date   BACK SURGERY  1990,2003,2004,2010   CARDIAC CATHETERIZATION  2011   [facility].    CARDIAC SURGERY  2002   ablation   CHOLECYSTECTOMY  1976   COLONOSCOPY  2008   ESOPHAGOGASTRODUODENOSCOPY N/A 01/27/2013   Procedure: ESOPHAGOGASTRODUODENOSCOPY (EGD) upper endoscopy;  Surgeon: Shann Medal, MD;  Location: [facility];  Service: General;  Laterality: N/A;   GASTRIC BYPASS  08/22/10   HAND SURGERY  2000   right   SUBTHALAMIC STIMULATOR BATTERY REPLACEMENT  08/16/2020   TONSILLECTOMY  1971    Family History  Problem Relation Age of Onset   Lung cancer Mother    Alcohol abuse Mother    Colon cancer Brother        late 57's   Liver cancer Brother    Alcohol abuse Brother    Cirrhosis Sister        secondary medication   Diabetes Sister    Kidney failure Brother        Dialysis at 89, born single kidney   Esophageal cancer Neg Hx    Stomach cancer Neg Hx    Rectal cancer Neg Hx     Social History   Socioeconomic History   Marital status: Married    Spouse name: Mateo Flow   Number of children: 4   Years of education: 12    Highest education level: GED or equivalent  Occupational History   Occupation: [employer]    Comment: retired  Tobacco Use   Smoking status: Former    Packs/day: 0.50    Years: 18.00    Pack years: 9.00    Types: Cigarettes    Quit date: 08/04/1990    Years since quitting: 31.3   Smokeless tobacco: Never  Vaping Use   Vaping Use: Never used  Substance and Sexual Activity   Alcohol use: No   Drug use: No   Sexual activity: Yes  Other Topics Concern   Not on file  Social History Narrative   Married 1 daughter (58) and 3 adopted children (6, 3, 14 months)   Disabled, chronic back pain, 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   No alcohol or caffeine tobacco   Social Determinants of Radio broadcast assistant Strain: Low Risk    Difficulty of Paying Living Expenses: Not hard at all  Food Insecurity: No Food Insecurity   Worried About Charity fundraiser in the Last Year: Never true   Ran Out of Food in the Last Year: Never true  Transportation Needs: No  Transportation Needs   Lack of Transportation (Medical): No   Lack of Transportation (Non-Medical): No  Physical Activity: Inactive   Days of Exercise per Week: 0 days   Minutes of Exercise per Session: 0 min  Stress: No Stress Concern Present   Feeling of Stress : Not at all  Social Connections: Moderately Integrated   Frequency of Communication with Friends and Family: More than three times a week   Frequency of Social Gatherings with Friends and Family: Once a week   Attends Religious Services: More than 4 times per year   Active Member of [employer] or Organizations: No   Attends Archivist Meetings: Never   Marital Status: Married  Human resources officer Violence: Not At Risk   Fear of Current or Ex-Partner: No   Emotionally Abused: No   Physically Abused: No   Sexually Abused: No    Outpatient Medications Prior to Visit  Medication Sig Dispense Refill   ACCU-CHEK FASTCLIX LANCETS MISC For  testing blood sugars 1-2 times daily. DX: R73.01 306 each 4   ACCU-CHEK SMARTVIEW test strip TEST BLOOD SUGAR TWICE DAILY 200 strip 11   Alcohol Swabs (B-D SINGLE USE SWABS REGULAR) PADS USE TOPICALLY  1  TO  2  TIMES  DAILY AS DIRECTED 200 each 4   alendronate (FOSAMAX) 70 MG tablet TAKE 1 TABLET (70 MG TOTAL) BY MOUTH EVERY 7 DAYS. TAKE WITH A FULL GLASS OF WATER ON AN EMPTY STOMACH. 12 tablet 0   atorvastatin (LIPITOR) 20 MG tablet TAKE 1 TABLET EVERY DAY 90 tablet 3   baclofen (LIORESAL) 10 MG tablet daily.     cetirizine (ZYRTEC ALLERGY) 10 MG tablet Take 1 tablet (10 mg total) by mouth daily. (Patient taking differently: Take 10 mg by mouth daily as needed.) 30 tablet 0   Cyanocobalamin (VITAMIN B-12 PO) Take 1 tablet by mouth every Wednesday.     desvenlafaxine (PRISTIQ) 100 MG 24 hr tablet Take 1 tablet (100 mg total) by mouth daily. 30 tablet 2   Docusate Sodium (COLACE PO)      EPINEPHrine 0.3 mg/0.3 mL IJ SOAJ injection Inject 0.3 mLs (0.3 mg total) into the muscle once. 2 Device prn   fluticasone (FLONASE) 50 MCG/ACT nasal spray Place 2 sprays into both nostrils daily. 1 g 0   gabapentin (NEURONTIN) 300 MG capsule TAKE 2 CAPSULES EVERY DAY 180 capsule 0   metFORMIN (GLUCOPHAGE-XR) 500 MG 24 hr tablet TAKE 1 TABLET DAILY WITH BREAKFAST. 90 tablet 0   metoprolol succinate (TOPROL-XL) 25 MG 24 hr tablet TAKE 1 TABLET DAILY 90 tablet 3   omeprazole (PRILOSEC) 40 MG capsule TAKE 1 CAPSULE EVERY DAY 90 capsule 3   ondansetron (ZOFRAN-ODT) 4 MG disintegrating tablet DISSOLVE 1 TABLET ON THE TONGUE EVERY 8 HOURS AS NEEDED FOR NAUSEA AND VOMITING 60 tablet 4   Oxycodone HCl 10 MG TABS Take 10 mg by mouth daily.     Blood Glucose Calibration (ACCU-CHEK SMARTVIEW CONTROL) LIQD To be used to calibrate glucose meter as needed. 1 each prn   Blood Glucose Monitoring Suppl (ACCU-CHEK NANO SMARTVIEW) w/Device KIT Check fasting blood sugars every morning and once after largest meal of the day a few x a  week. DX:R73.01 1 kit 0   No facility-administered medications prior to visit.    Allergies  Allergen Reactions   Bee Venom Anaphylaxis   Loratadine Itching    REACTION: dizziness   Nsaids Other (See Comments)    GI intolerance.  Latex Rash    ROS Review of Systems    Objective:    Physical Exam  There were no vitals taken for this visit. Wt Readings from Last 3 Encounters:  08/24/21 189 lb (85.7 kg)  07/08/21 184 lb 1.4 oz (83.5 kg)  03/03/21 184 lb (83.5 kg)     Health Maintenance Due  Topic Date Due   Zoster Vaccines- Shingrix (1 of 2) Never done   Pneumonia Vaccine 41+ Years old (2 - PCV) 08/07/2019   URINE MICROALBUMIN  11/28/2019   COVID-19 Vaccine (3 - Pfizer risk series) 07/29/2020   OPHTHALMOLOGY EXAM  03/09/2021   INFLUENZA VACCINE  05/23/2021   HEMOGLOBIN A1C  07/22/2021    There are no preventive care reminders to display for this patient.  Lab Results  Component Value Date   TSH 1.03 03/11/2020   Lab Results  Component Value Date   WBC 4.8 03/15/2020   HGB 12.3 03/15/2020   HCT 38.0 03/15/2020   MCV 89.8 03/15/2020   PLT 153 03/15/2020   Lab Results  Component Value Date   NA 141 01/19/2021   K 4.2 01/19/2021   CO2 28 01/19/2021   GLUCOSE 103 (H) 01/19/2021   BUN 16 01/19/2021   CREATININE 0.75 01/19/2021   BILITOT 0.8 03/11/2020   ALKPHOS 95 11/07/2016   AST 30 03/11/2020   ALT 23 03/11/2020   PROT 6.4 03/11/2020   ALBUMIN 3.6 11/07/2016   CALCIUM 8.8 01/19/2021   Lab Results  Component Value Date   CHOL 70 03/03/2021   Lab Results  Component Value Date   HDL 27 (L) 03/03/2021   Lab Results  Component Value Date   LDLCALC 30 03/03/2021   Lab Results  Component Value Date   TRIG 58 03/03/2021   Lab Results  Component Value Date   CHOLHDL 2.6 03/03/2021   Lab Results  Component Value Date   HGBA1C 6.6 (H) 01/19/2021      Assessment & Plan:   Problem List Items Addressed This Visit        Cardiovascular and Mediastinum   AVNRT (AV nodal re-entry tachycardia) ([place])    Continue metoprolol for rate control.        Digestive   Liver fibrosis ([place]) - suspected    Continue to monitor liver enzymes.  Last elastography was in 2019 like to get that updated if she is okay with that.        Endocrine   IMPAIRED FASTING GLUCOSE - Primary     Other   Major depressive disorder, single episode, severe without psychotic features ([place])    Currently on Pristiq 100 mg daily.       No orders of the defined types were placed in this encounter.   Follow-up: No follow-ups on file.    Beatrice Lecher, MD

## 2021-11-24 NOTE — Assessment & Plan Note (Deleted)
Currently on Pristiq 100 mg daily.

## 2021-11-24 NOTE — Assessment & Plan Note (Deleted)
Continue to monitor liver enzymes.  Last elastography was in 2019 like to get that updated if she is okay with that.

## 2021-11-24 NOTE — Assessment & Plan Note (Deleted)
Continue metoprolol for rate control.

## 2021-12-19 ENCOUNTER — Other Ambulatory Visit: Payer: Self-pay

## 2021-12-19 ENCOUNTER — Ambulatory Visit (INDEPENDENT_AMBULATORY_CARE_PROVIDER_SITE_OTHER): Payer: Medicare Other

## 2021-12-19 ENCOUNTER — Ambulatory Visit (INDEPENDENT_AMBULATORY_CARE_PROVIDER_SITE_OTHER): Payer: Medicare Other | Admitting: Family Medicine

## 2021-12-19 VITALS — BP 135/86 | HR 76 | Temp 98.0°F | Ht 65.0 in | Wt 178.0 lb

## 2021-12-19 DIAGNOSIS — E785 Hyperlipidemia, unspecified: Secondary | ICD-10-CM

## 2021-12-19 DIAGNOSIS — L539 Erythematous condition, unspecified: Secondary | ICD-10-CM | POA: Diagnosis not present

## 2021-12-19 DIAGNOSIS — M810 Age-related osteoporosis without current pathological fracture: Secondary | ICD-10-CM | POA: Diagnosis not present

## 2021-12-19 DIAGNOSIS — J329 Chronic sinusitis, unspecified: Secondary | ICD-10-CM

## 2021-12-19 DIAGNOSIS — R21 Rash and other nonspecific skin eruption: Secondary | ICD-10-CM | POA: Diagnosis not present

## 2021-12-19 DIAGNOSIS — R5383 Other fatigue: Secondary | ICD-10-CM | POA: Diagnosis not present

## 2021-12-19 DIAGNOSIS — J40 Bronchitis, not specified as acute or chronic: Secondary | ICD-10-CM

## 2021-12-19 DIAGNOSIS — E538 Deficiency of other specified B group vitamins: Secondary | ICD-10-CM

## 2021-12-19 DIAGNOSIS — I471 Supraventricular tachycardia: Secondary | ICD-10-CM

## 2021-12-19 DIAGNOSIS — R0789 Other chest pain: Secondary | ICD-10-CM | POA: Diagnosis not present

## 2021-12-19 DIAGNOSIS — F322 Major depressive disorder, single episode, severe without psychotic features: Secondary | ICD-10-CM

## 2021-12-19 DIAGNOSIS — E618 Deficiency of other specified nutrient elements: Secondary | ICD-10-CM

## 2021-12-19 DIAGNOSIS — K7400 Hepatic fibrosis, unspecified: Secondary | ICD-10-CM

## 2021-12-19 DIAGNOSIS — I4719 Other supraventricular tachycardia: Secondary | ICD-10-CM

## 2021-12-19 DIAGNOSIS — R7301 Impaired fasting glucose: Secondary | ICD-10-CM

## 2021-12-19 DIAGNOSIS — D649 Anemia, unspecified: Secondary | ICD-10-CM | POA: Diagnosis not present

## 2021-12-19 DIAGNOSIS — Z9884 Bariatric surgery status: Secondary | ICD-10-CM | POA: Diagnosis not present

## 2021-12-19 MED ORDER — ATORVASTATIN CALCIUM 20 MG PO TABS: 20.0000 mg | ORAL_TABLET | Freq: Every day | ORAL | 3 refills | Status: DC

## 2021-12-19 MED ORDER — METFORMIN HCL ER 500 MG PO TB24: 500.0000 mg | ORAL_TABLET | Freq: Every day | ORAL | 3 refills | Status: DC

## 2021-12-19 MED ORDER — ALENDRONATE SODIUM 70 MG PO TABS: ORAL_TABLET | ORAL | 3 refills | Status: DC

## 2021-12-19 MED ORDER — DESVENLAFAXINE SUCCINATE ER 100 MG PO TB24: 100.0000 mg | ORAL_TABLET | Freq: Every day | ORAL | 1 refills | Status: DC

## 2021-12-19 MED ORDER — METOPROLOL SUCCINATE ER 25 MG PO TB24: 25.0000 mg | ORAL_TABLET | Freq: Every day | ORAL | 3 refills | Status: DC

## 2021-12-19 MED ORDER — OMEPRAZOLE 40 MG PO CPDR: 40.0000 mg | DELAYED_RELEASE_CAPSULE | Freq: Every day | ORAL | 3 refills | Status: DC

## 2021-12-19 MED ORDER — DOXYCYCLINE HYCLATE 100 MG PO TABS: 100.0000 mg | ORAL_TABLET | Freq: Two times a day (BID) | ORAL | 0 refills | Status: DC

## 2021-12-19 NOTE — Assessment & Plan Note (Signed)
She is not currently taking any type of vitamin or supplement.  We will recheck levels to see if she may be in deficient.  Her exam is most consistent with anemia.

## 2021-12-19 NOTE — Assessment & Plan Note (Signed)
Go ahead and restart beta-blocker.  New prescription sent to pharmacy.  She was also having some chest pressure today.

## 2021-12-19 NOTE — Assessment & Plan Note (Signed)
ForContinue  with Pristiq.  I do think she needs to set some firm guidelines in her house  For her grandson and his girlfriend "arguing".

## 2021-12-19 NOTE — Progress Notes (Signed)
Established Patient Office Visit  Subjective:  Patient ID: Theresa Scott, female    DOB: 06-15-1956  Age: 66 y.o. MRN: [id number]  CC:  Chief Complaint  Patient presents with   Follow-up    HPI Theresa Scott presents for cough and chest congestion x 5 days.  She has been off her allergy medications dn off ehr blocker.  She has been having a few more palpitations.  Impaired fasting glucose-no increased thirst or urination. No symptoms consistent with hypoglycemia.  F/U MDD -she has been under a lot of stress recently.  Her grandson and his girlfriend and their baby is currently living with them.  They fight a lot and that just really creates a lot of stress for her.  She says she still taking her medications.  She did run out of her metoprolol a while back and so is not currently taking that and reports that she has been having some chest tightness and heaviness as well as some fluttering on and off.  She says even right now sitting here it feels heavy.  She is status post gastric bypass in 2011 but has not been taking her vitamins and supplements.  She also noticed some soreness on her left lower leg and noticed this morning it looked red.  She has had a similar rash before.  She does not remember any trauma injury or contact with anything.  It is not itchy.  Past Medical History:  Diagnosis Date   Chronic kidney disease    Complication of anesthesia    hard to wake up and swelling after kidney stone removal in 1980 per pt   Degenerative disc disease    Depression    Diabetes mellitus    Fatty liver disease, nonalcoholic    Gallstones    GERD (gastroesophageal reflux disease)    Hypercholesterolemia    Hypertension    Kidney stones    Obesity    Osteoporosis    Pancreatitis     Past Surgical History:  Procedure Laterality Date   BACK SURGERY  1990,2003,2004,2010   CARDIAC CATHETERIZATION  2011   [facility].    CARDIAC SURGERY  2002   ablation   CHOLECYSTECTOMY   1976   COLONOSCOPY  2008   ESOPHAGOGASTRODUODENOSCOPY N/A 01/27/2013   Procedure: ESOPHAGOGASTRODUODENOSCOPY (EGD) upper endoscopy;  Surgeon: Shann Medal, MD;  Location: [facility];  Service: General;  Laterality: N/A;   GASTRIC BYPASS  08/22/10   HAND SURGERY  2000   right   SUBTHALAMIC STIMULATOR BATTERY REPLACEMENT  08/16/2020   TONSILLECTOMY  1971    Family History  Problem Relation Age of Onset   Lung cancer Mother    Alcohol abuse Mother    Colon cancer Brother        late 51's   Liver cancer Brother    Alcohol abuse Brother    Cirrhosis Sister        secondary medication   Diabetes Sister    Kidney failure Brother        Dialysis at 33, born single kidney   Esophageal cancer Neg Hx    Stomach cancer Neg Hx    Rectal cancer Neg Hx     Social History   Socioeconomic History   Marital status: Married    Spouse name: Mateo Flow   Number of children: 4   Years of education: 12   Highest education level: GED or equivalent  Occupational History   Occupation: [employer]  Comment: retired  Tobacco Use   Smoking status: Former    Packs/day: 0.50    Years: 18.00    Pack years: 9.00    Types: Cigarettes    Quit date: 08/04/1990    Years since quitting: 31.3   Smokeless tobacco: Never  Vaping Use   Vaping Use: Never used  Substance and Sexual Activity   Alcohol use: No   Drug use: No   Sexual activity: Yes  Other Topics Concern   Not on file  Social History Narrative   Married 1 daughter (21) and 3 adopted children (16, 3, 14 months)   Disabled, chronic back pain, former [place] her.   No alcohol or caffeine tobacco   Social Determinants of Radio broadcast assistant Strain: Low Risk    Difficulty of Paying Living Expenses: Not hard at all  Food Insecurity: No Food Insecurity   Worried About Charity fundraiser in the Last Year: Never true   Ran Out of Food in the Last Year: Never true  Transportation Needs: No  Transportation Needs   Lack of Transportation (Medical): No   Lack of Transportation (Non-Medical): No  Physical Activity: Inactive   Days of Exercise per Week: 0 days   Minutes of Exercise per Session: 0 min  Stress: No Stress Concern Present   Feeling of Stress : Not at all  Social Connections: Moderately Integrated   Frequency of Communication with Friends and Family: More than three times a week   Frequency of Social Gatherings with Friends and Family: Once a week   Attends Religious Services: More than 4 times per year   Active Member of [employer] or Organizations: No   Attends Archivist Meetings: Never   Marital Status: Married  Human resources officer Violence: Not At Risk   Fear of Current or Ex-Partner: No   Emotionally Abused: No   Physically Abused: No   Sexually Abused: No    Outpatient Medications Prior to Visit  Medication Sig Dispense Refill   ACCU-CHEK FASTCLIX LANCETS MISC For testing blood sugars 1-2 times daily. DX: R73.01 306 each 4   ACCU-CHEK SMARTVIEW test strip TEST BLOOD SUGAR TWICE DAILY 200 strip 11   Alcohol Swabs (B-D SINGLE USE SWABS REGULAR) PADS USE TOPICALLY  1  TO  2  TIMES  DAILY AS DIRECTED 200 each 4   baclofen (LIORESAL) 10 MG tablet daily.     Cyanocobalamin (VITAMIN B-12 PO) Take 1 tablet by mouth every Wednesday.     Docusate Sodium (COLACE PO)      gabapentin (NEURONTIN) 300 MG capsule TAKE 2 CAPSULES EVERY DAY 180 capsule 0   ondansetron (ZOFRAN-ODT) 4 MG disintegrating tablet DISSOLVE 1 TABLET ON THE TONGUE EVERY 8 HOURS AS NEEDED FOR NAUSEA AND VOMITING 60 tablet 4   Oxycodone HCl 10 MG TABS Take 10 mg by mouth daily.     alendronate (FOSAMAX) 70 MG tablet TAKE 1 TABLET (70 MG TOTAL) BY MOUTH EVERY 7 DAYS. TAKE WITH A FULL GLASS OF WATER ON AN EMPTY STOMACH. 12 tablet 0   atorvastatin (LIPITOR) 20 MG tablet TAKE 1 TABLET EVERY DAY 90 tablet 3   desvenlafaxine (PRISTIQ) 100 MG 24 hr tablet Take 1 tablet (100 mg total) by mouth daily. 30  tablet 2   metFORMIN (GLUCOPHAGE-XR) 500 MG 24 hr tablet TAKE 1 TABLET DAILY WITH BREAKFAST. 90 tablet 0   omeprazole (PRILOSEC) 40 MG capsule TAKE 1 CAPSULE EVERY DAY 90 capsule 3  EPINEPHrine 0.3 mg/0.3 mL IJ SOAJ injection Inject 0.3 mLs (0.3 mg total) into the muscle once. (Patient not taking: Reported on 12/19/2021) 2 Device prn   cetirizine (ZYRTEC ALLERGY) 10 MG tablet Take 1 tablet (10 mg total) by mouth daily. (Patient taking differently: Take 10 mg by mouth daily as needed.) 30 tablet 0   fluticasone (FLONASE) 50 MCG/ACT nasal spray Place 2 sprays into both nostrils daily. 1 g 0   metoprolol succinate (TOPROL-XL) 25 MG 24 hr tablet TAKE 1 TABLET DAILY (Patient not taking: Reported on 12/19/2021) 90 tablet 3   No facility-administered medications prior to visit.    Allergies  Allergen Reactions   Bee Venom Anaphylaxis   Loratadine Itching    REACTION: dizziness   Nsaids Other (See Comments)    GI intolerance.     Latex Rash    ROS Review of Systems    Objective:    Physical Exam Constitutional:      Appearance: Normal appearance. She is well-developed.  HENT:     Head: Normocephalic and atraumatic.     Comments: Pale conjunctive a.    Right Ear: Tympanic membrane, ear canal and external ear normal.     Left Ear: Tympanic membrane, ear canal and external ear normal.     Nose: Nose normal.     Mouth/Throat:     Mouth: Mucous membranes are moist.     Pharynx: Oropharynx is clear. No oropharyngeal exudate or posterior oropharyngeal erythema.  Eyes:     Conjunctiva/sclera: Conjunctivae normal.     Pupils: Pupils are equal, round, and reactive to light.  Neck:     Thyroid: No thyromegaly.  Cardiovascular:     Rate and Rhythm: Normal rate and regular rhythm.     Heart sounds: Normal heart sounds.  Pulmonary:     Effort: Pulmonary effort is normal.     Breath sounds: Normal breath sounds. No wheezing.  Musculoskeletal:     Cervical back: Neck supple.   Lymphadenopathy:     Cervical: No cervical adenopathy.  Skin:    General: Skin is warm and dry.  Neurological:     Mental Status: She is alert and oriented to person, place, and time.  Psychiatric:        Behavior: Behavior normal.       BP 135/86    Pulse 76    Temp 98 F (36.7 C) (Oral)    Ht 5\' 5"  (1.651 m)    Wt 178 lb (80.7 kg)    SpO2 98%    BMI 29.62 kg/m  Wt Readings from Last 3 Encounters:  12/19/21 178 lb (80.7 kg)  08/24/21 189 lb (85.7 kg)  07/08/21 184 lb 1.4 oz (83.5 kg)     Health Maintenance Due  Topic Date Due   URINE MICROALBUMIN  11/28/2019    There are no preventive care reminders to display for this patient.  Lab Results  Component Value Date   TSH 1.03 03/11/2020   Lab Results  Component Value Date   WBC 4.8 03/15/2020   HGB 12.3 03/15/2020   HCT 38.0 03/15/2020   MCV 89.8 03/15/2020   PLT 153 03/15/2020   Lab Results  Component Value Date   NA 141 01/19/2021   K 4.2 01/19/2021   CO2 28 01/19/2021   GLUCOSE 103 (H) 01/19/2021   BUN 16 01/19/2021   CREATININE 0.75 01/19/2021   BILITOT 0.8 03/11/2020   ALKPHOS 95 11/07/2016   AST 30 03/11/2020   ALT  23 03/11/2020   PROT 6.4 03/11/2020   ALBUMIN 3.6 11/07/2016   CALCIUM 8.8 01/19/2021   Lab Results  Component Value Date   CHOL 70 03/03/2021   Lab Results  Component Value Date   HDL 27 (L) 03/03/2021   Lab Results  Component Value Date   LDLCALC 30 03/03/2021   Lab Results  Component Value Date   TRIG 58 03/03/2021   Lab Results  Component Value Date   CHOLHDL 2.6 03/03/2021   Lab Results  Component Value Date   HGBA1C 6.6 (H) 01/19/2021      Assessment & Plan:   Problem List Items Addressed This Visit       Cardiovascular and Mediastinum   AVNRT (AV nodal re-entry tachycardia) ([place])    Go ahead and restart beta-blocker.  New prescription sent to pharmacy.  She was also having some chest pressure today.      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 Silvano Rusk.        Endocrine   IMPAIRED FASTING GLUCOSE - Primary    Well overdue for A1c.  We will repeat today.      Relevant Medications   metFORMIN (GLUCOPHAGE-XR) 500 MG 24 hr tablet   Other Relevant Orders   Lipid Panel w/reflex Direct LDL   COMPLETE METABOLIC PANEL WITH GFR   CBC   B12   Fe+TIBC+Fer   Hemoglobin A1c   TSH   Magnesium   Vitamin B1   VITAMIN D 25 Hydroxy (Vit-D Deficiency, Fractures)     Musculoskeletal and Integument   Osteoporosis    Refilled Fosamax.  Also due for vitamin D level.      Relevant Medications   alendronate (FOSAMAX) 70 MG tablet     Other   Major depressive disorder, single episode, severe without psychotic features ([place])    ForContinue  with Pristiq.  I do think she needs to set some firm guidelines in her house  For her grandson and his girlfriend "arguing".        Relevant Medications   desvenlafaxine (PRISTIQ) 100 MG 24 hr tablet   Hyperlipemia   Relevant Medications   atorvastatin (LIPITOR) 20 MG tablet   metoprolol succinate (TOPROL-XL) 25 MG 24 hr tablet   History of gastric bypass, 08/22/2010.    She is not currently taking any type of vitamin or supplement.  We will recheck levels to see if she may be in deficient.  Her exam is most consistent with anemia.      Relevant Orders   Lipid Panel w/reflex Direct LDL   COMPLETE METABOLIC PANEL WITH GFR   CBC   B12   Fe+TIBC+Fer   Hemoglobin A1c   TSH   Magnesium   Vitamin B1   VITAMIN D 25 Hydroxy (Vit-D Deficiency, Fractures)   Other Visit Diagnoses     Rash       Relevant Orders   Lipid Panel w/reflex Direct LDL   COMPLETE METABOLIC PANEL WITH GFR   CBC   B12   Fe+TIBC+Fer   Hemoglobin A1c   TSH   Magnesium   Vitamin B1   VITAMIN D 25 Hydroxy (Vit-D  Deficiency, Fractures)   Other fatigue       Relevant Orders  Lipid Panel w/reflex Direct LDL   COMPLETE METABOLIC PANEL WITH GFR   CBC   B12   Fe+TIBC+Fer   Hemoglobin A1c   TSH   Magnesium   Vitamin B1   VITAMIN D 25 Hydroxy (Vit-D Deficiency, Fractures)   Atypical chest pain       Relevant Orders   Lipid Panel w/reflex Direct LDL   COMPLETE METABOLIC PANEL WITH GFR   CBC   B12   Fe+TIBC+Fer   Hemoglobin A1c   TSH   Magnesium   Vitamin B1   VITAMIN D 25 Hydroxy (Vit-D Deficiency, Fractures)   Low serum vitamin B12       Relevant Orders   B12   Mineral deficiency       Leg erythema       Relevant Orders   US Venous Img Lower Unilateral Left   Sinobronchitis       Relevant Medications   doxycycline (VIBRA-TABS) 100 MG tablet      Sinobronchitis  -go ahead and treat with doxycycline.  Call if not better in 1 week.  Rash/redness on the lower extremity-I am suspicious of a superficial venous thrombosis.  So we will get Doppler performed.  Atypical chest pain-EKG today shows rate of 71 bpm, normal sinus rhythm with no acute ST-T wave changes.  Meds ordered this encounter  Medications   DISCONTD: atorvastatin (LIPITOR) 20 MG tablet    Sig: Take 1 tablet (20 mg total) by mouth daily. At bedtime    Dispense:  90 tablet    Refill:  3   DISCONTD: alendronate (FOSAMAX) 70 MG tablet    Sig: Take with a full glass of water on an empty stomach.    Dispense:  12 tablet    Refill:  3   desvenlafaxine (PRISTIQ) 100 MG 24 hr tablet    Sig: Take 1 tablet (100 mg total) by mouth daily.    Dispense:  90 tablet    Refill:  1    Call [phone] to reorder. Customer [phone]   DISCONTD: metFORMIN (GLUCOPHAGE-XR) 500 MG 24 hr tablet    Sig: Take 1 tablet (500 mg total) by mouth daily with breakfast.    Dispense:  90 tablet    Refill:  3   DISCONTD: omeprazole (PRILOSEC) 40 MG capsule    Sig: Take 1 capsule (40 mg total) by mouth daily.    Dispense:  90 capsule    Refill:   3   DISCONTD: metoprolol succinate (TOPROL-XL) 25 MG 24 hr tablet    Sig: Take 1 tablet (25 mg total) by mouth daily.    Dispense:  90 tablet    Refill:  3   doxycycline (VIBRA-TABS) 100 MG tablet    Sig: Take 1 tablet (100 mg total) by mouth 2 (two) times daily.    Dispense:  14 tablet    Refill:  0   atorvastatin (LIPITOR) 20 MG tablet    Sig: Take 1 tablet (20 mg total) by mouth daily. At bedtime    Dispense:  90 tablet    Refill:  3   omeprazole (PRILOSEC) 40 MG capsule    Sig: Take 1 capsule (40 mg total) by mouth daily.    Dispense:  90 capsule    Refill:  3   metoprolol succinate (TOPROL-XL) 25 MG 24 hr tablet    Sig: Take 1 tablet (25 mg total) by mouth daily.    Dispense:  90 tablet    Refill:  3  metFORMIN (GLUCOPHAGE-XR) 500 MG 24 hr tablet    Sig: Take 1 tablet (500 mg total) by mouth daily with breakfast.    Dispense:  90 tablet    Refill:  3   alendronate (FOSAMAX) 70 MG tablet    Sig: Take with a full glass of water on an empty stomach.    Dispense:  12 tablet    Refill:  3    Follow-up: Return in about 4 weeks (around 01/16/2022) for chest pressure.    Beatrice Lecher, MD

## 2021-12-19 NOTE — Progress Notes (Signed)
Hi Jonna, great news no sign of deep clot and no sign of superficial clot.  This is reassuring.  I would just recommend wearing your compression stocking and keeping an eye on the redness if its not improving or if it is getting worse or spreading then please let me know.

## 2021-12-19 NOTE — Assessment & Plan Note (Signed)
Recheck liver enzymes today if elevated consider getting her back in with Dr. Silvano Rusk.

## 2021-12-19 NOTE — Assessment & Plan Note (Signed)
Refilled Fosamax.  Also due for vitamin D level.

## 2021-12-19 NOTE — Assessment & Plan Note (Signed)
Well overdue for A1c.  We will repeat today.

## 2021-12-20 NOTE — Progress Notes (Signed)
Hi Kaari, great news!  Your A1c still looks good at 6.4.  Just make sure you are taking your metformin regularly.  Your vitamin D is low so make sure that you are taking a vitamin D supplement.  Vitamin D3 which is found over-the-counter is a great source.  I would recommend taking 50 mcg daily.  Thyroid and B12 look good.  Other labs are still pending.

## 2021-12-21 ENCOUNTER — Encounter: Payer: Self-pay | Admitting: Family Medicine

## 2021-12-21 NOTE — Progress Notes (Signed)
Hi [name], your iron levels are on the low end the total iron looks okay but your ferritin is low which reflects your iron stores.  So I would get back on your daily multivitamin with iron.  Also work on eating iron rich foods.  Your A1c is a little better at 6.4.  Magnesium is normal.  Vitamin B1 is still pending.

## 2021-12-22 NOTE — Addendum Note (Signed)
Addended by: Narda Rutherford on: 12/22/2021 10:05 AM   Modules accepted: Orders

## 2021-12-23 DIAGNOSIS — Z79899 Other long term (current) drug therapy: Secondary | ICD-10-CM | POA: Diagnosis not present

## 2021-12-23 DIAGNOSIS — M4326 Fusion of spine, lumbar region: Secondary | ICD-10-CM | POA: Diagnosis not present

## 2021-12-23 DIAGNOSIS — G894 Chronic pain syndrome: Secondary | ICD-10-CM | POA: Diagnosis not present

## 2021-12-23 LAB — CBC
HCT: 37.9 % (ref 35.0–45.0)
Hemoglobin: 12.2 g/dL (ref 11.7–15.5)
MCH: 28.3 pg (ref 27.0–33.0)
MCHC: 32.2 g/dL (ref 32.0–36.0)
MCV: 87.9 fL (ref 80.0–100.0)
MPV: 13.1 fL — ABNORMAL HIGH (ref 7.5–12.5)
Platelets: 145 10*3/uL (ref 140–400)
RBC: 4.31 10*6/uL (ref 3.80–5.10)
RDW: 12.5 % (ref 11.0–15.0)
WBC: 5.3 10*3/uL (ref 3.8–10.8)

## 2021-12-23 LAB — COMPLETE METABOLIC PANEL WITH GFR
AG Ratio: 1.2 (calc) (ref 1.0–2.5)
ALT: 23 U/L (ref 6–29)
AST: 35 U/L (ref 10–35)
Albumin: 4.1 g/dL (ref 3.6–5.1)
Alkaline phosphatase (APISO): 66 U/L (ref 37–153)
BUN: 12 mg/dL (ref 7–25)
CO2: 28 mmol/L (ref 20–32)
Calcium: 9 mg/dL (ref 8.6–10.4)
Chloride: 107 mmol/L (ref 98–110)
Creat: 0.69 mg/dL (ref 0.50–1.05)
Globulin: 3.3 g/dL (calc) (ref 1.9–3.7)
Glucose, Bld: 92 mg/dL (ref 65–99)
Potassium: 4.3 mmol/L (ref 3.5–5.3)
Sodium: 142 mmol/L (ref 135–146)
Total Bilirubin: 0.5 mg/dL (ref 0.2–1.2)
Total Protein: 7.4 g/dL (ref 6.1–8.1)
eGFR: 96 mL/min/{1.73_m2} (ref >=60)

## 2021-12-23 LAB — IRON,TIBC AND FERRITIN PANEL
%SAT: 23 % (calc) (ref 16–45)
Ferritin: 12 ng/mL — ABNORMAL LOW (ref 16–288)
Iron: 91 ug/dL (ref 45–160)
TIBC: 404 mcg/dL (calc) (ref 250–450)

## 2021-12-23 LAB — TSH: TSH: 1.34 mIU/L (ref 0.40–4.50)

## 2021-12-23 LAB — HEMOGLOBIN A1C
Hgb A1c MFr Bld: 6.4 % of total Hgb — ABNORMAL HIGH (ref <5.7)
Mean Plasma Glucose: 137 mg/dL
eAG (mmol/L): 7.6 mmol/L

## 2021-12-23 LAB — MAGNESIUM: Magnesium: 2 mg/dL (ref 1.5–2.5)

## 2021-12-23 LAB — VITAMIN B12: Vitamin B-12: 691 pg/mL (ref 200–1100)

## 2021-12-23 LAB — VITAMIN B1: Vitamin B1 (Thiamine): 16 nmol/L (ref 8–30)

## 2021-12-23 LAB — VITAMIN D 25 HYDROXY (VIT D DEFICIENCY, FRACTURES): Vit D, 25-Hydroxy: 11 ng/mL — ABNORMAL LOW (ref 30–100)

## 2021-12-23 NOTE — Progress Notes (Signed)
Hi Jaiden, your B1 looks good.

## 2021-12-26 ENCOUNTER — Telehealth: Payer: Self-pay

## 2021-12-26 DIAGNOSIS — F322 Major depressive disorder, single episode, severe without psychotic features: Secondary | ICD-10-CM

## 2021-12-26 NOTE — Telephone Encounter (Signed)
She is already on the max dose.  We could consider adding something like Wellbutrin to her regimen ,sometimes it can be a good add on. ?

## 2021-12-26 NOTE — Telephone Encounter (Signed)
Patient called stating she is going through a tough time right now and would like to know if she can increase the dose on her Pristiq?  ?

## 2021-12-27 MED ORDER — BUPROPION HCL ER (XL) 150 MG PO TB24: 150.0000 mg | ORAL_TABLET | Freq: Every day | ORAL | 0 refills | Status: DC

## 2021-12-27 NOTE — Telephone Encounter (Signed)
Pt informed of RX.  Advised pt to call in 2-3 weeks with let us know how she is doing, but to call sooner if she has any problems with the medication.  Charyl Bigger, CMA ?

## 2021-12-27 NOTE — Telephone Encounter (Signed)
Pt is agreeable to trying Wellbutrin.  Please send RX to [facility] on [address] in [place].  Charyl Bigger, CMA ?

## 2021-12-27 NOTE — Telephone Encounter (Signed)
Meds ordered this encounter  ?Medications  ? buPROPion (WELLBUTRIN XL) 150 MG 24 hr tablet  ?  Sig: Take 1 tablet (150 mg total) by mouth daily.  ?  Dispense:  90 tablet  ?  Refill:  0  ? ? ?

## 2022-01-16 ENCOUNTER — Ambulatory Visit: Payer: PRIVATE HEALTH INSURANCE | Admitting: Family Medicine

## 2022-01-17 ENCOUNTER — Ambulatory Visit: Payer: PRIVATE HEALTH INSURANCE | Admitting: Family Medicine

## 2022-02-08 DIAGNOSIS — M545 Low back pain, unspecified: Secondary | ICD-10-CM | POA: Diagnosis not present

## 2022-02-08 DIAGNOSIS — G894 Chronic pain syndrome: Secondary | ICD-10-CM | POA: Diagnosis not present

## 2022-02-08 DIAGNOSIS — M4326 Fusion of spine, lumbar region: Secondary | ICD-10-CM | POA: Diagnosis not present

## 2022-02-16 ENCOUNTER — Telehealth: Payer: Self-pay | Admitting: *Deleted

## 2022-02-16 NOTE — Telephone Encounter (Signed)
Pt called and stated that she is still feeling extremely fatigued. She said that she is getting enough sleep/rest and eating well.  ? ?She wanted to know if Dr. Madilyn Fireman had any recommendations as to what else she should be doing. ? ?Will fwd to pcp for advice ?

## 2022-02-16 NOTE — Telephone Encounter (Signed)
Make sure taking MVI with iron in it and taking her Vitamin D.  Needs to make sure getting 30 min of exercise 5 days pwer week. Can start with 15 min walk. Work on stress reduction.  Make sure getting sleep at night to be well rested. ?

## 2022-02-17 NOTE — Telephone Encounter (Signed)
Called and advised pt of recommendations. No further questions. ?

## 2022-03-01 DIAGNOSIS — G894 Chronic pain syndrome: Secondary | ICD-10-CM | POA: Diagnosis not present

## 2022-03-01 DIAGNOSIS — M4326 Fusion of spine, lumbar region: Secondary | ICD-10-CM | POA: Diagnosis not present

## 2022-03-22 ENCOUNTER — Other Ambulatory Visit: Payer: Self-pay | Admitting: Family Medicine

## 2022-03-29 ENCOUNTER — Telehealth: Payer: Self-pay | Admitting: *Deleted

## 2022-03-29 MED ORDER — BUPROPION HCL ER (XL) 150 MG PO TB24: 150.0000 mg | ORAL_TABLET | Freq: Every day | ORAL | 0 refills | Status: DC

## 2022-03-29 NOTE — Telephone Encounter (Signed)
Pt called and asked that a refill be called in for Pristiq 100 mg.   ORDER [id number] will be shipped in 7-10 business days  NEXT REFILL DATE: 06/05/2022

## 2022-04-03 ENCOUNTER — Ambulatory Visit (INDEPENDENT_AMBULATORY_CARE_PROVIDER_SITE_OTHER): Payer: Medicare Other | Admitting: Family Medicine

## 2022-04-03 DIAGNOSIS — Z0000 Encounter for general adult medical examination without abnormal findings: Secondary | ICD-10-CM

## 2022-04-03 NOTE — Patient Instructions (Signed)
[place] Maintenance Summary and Written Plan of Care  Ms. Theresa Scott ,  Thank you for allowing me to perform your Medicare Annual Wellness Visit and for your ongoing commitment to your health.   Health Maintenance & Immunization History Health Maintenance  Topic Date Due   COVID-19 Vaccine (3 - Pfizer risk series) 04/19/2022 (Originally 07/29/2020)   URINE MICROALBUMIN  05/03/2022 (Originally 11/28/2019)   Zoster Vaccines- Shingrix (1 of 2) 07/04/2022 (Originally 04/12/1975)   Pneumonia Vaccine 52+ Years old (2 - PCV) 12/19/2022 (Originally 08/07/2019)   INFLUENZA VACCINE  05/23/2022   DEXA SCAN  07/21/2023   MAMMOGRAM  09/22/2023   TETANUS/TDAP  11/18/2025   PAP SMEAR-Modifier  03/03/2026   COLONOSCOPY (Pts 45-78yr Insurance coverage will need to be confirmed)  12/21/2027   Hepatitis C Screening  Completed   HIV Screening  Completed   HPV VACCINES  Aged Out   Immunization History  Administered Date(s) Administered   Hep A / Hep B 08/06/2018, 11/27/2018   Influenza Split 08/15/2011, 09/09/2012   Influenza Whole 07/20/2008, 08/12/2009, 08/03/2010   Influenza, High Dose Seasonal PF 07/18/2017, 06/07/2019   Influenza,inj,Quad PF,6+ Mos 06/26/2013, 09/15/2014, 07/01/2015, 08/21/2016, 08/06/2018, 06/07/2019   PFIZER(Purple Top)SARS-COV-2 Vaccination 06/10/2020, 07/01/2020   Pneumococcal Polysaccharide-23 08/06/2018   Pneumococcal-Unspecified 10/23/2001   Tdap 11/19/2015    These are the patient goals that we discussed:  Goals Addressed               This Visit's Progress     Patient Stated (pt-stated)        Patient stated that she would like to loose 20-30 lbs.         This is a list of Health Maintenance Items that are overdue or due now: Pneumococcal vaccine  Shingrix  Urine Microalbumin  Orders/Referrals Placed Today: No orders of the defined types were placed in this encounter.  (Contact our referral department at [phone] you  have not spoken with someone about your referral appointment within the next 5 days)    Follow-up Plan Follow-up with MHali Marry MD as planned Please schedule your pneumococcal vaccine and shingrix vaccine. Medicare wellness visit in one year.  Patient will access AVS on my chart.     Health Maintenance, Female Adopting a healthy lifestyle and getting preventive care are important in promoting health and wellness. Ask your health care provider about: The right schedule for you to have regular tests and exams. Things you can do on your own to prevent diseases and keep yourself healthy. What should I know about diet, weight, and exercise? Eat a healthy diet  Eat a diet that includes plenty of vegetables, fruits, low-fat dairy products, and lean protein. Do not eat a lot of foods that are high in solid fats, added sugars, or sodium. Maintain a healthy weight Body mass index (BMI) is used to identify weight problems. It estimates body fat based on height and weight. Your health care provider can help determine your BMI and help you achieve or maintain a healthy weight. Get regular exercise Get regular exercise. This is one of the most important things you can do for your health. Most adults should: Exercise for at least 150 minutes each week. The exercise should increase your heart rate and make you sweat (moderate-intensity exercise). Do strengthening exercises at least twice a week. This is in addition to the moderate-intensity exercise. Spend less time sitting. Even light physical activity can be beneficial. Watch cholesterol and blood lipids  Have your blood tested for lipids and cholesterol at 66 years of age, then have this test every 5 years. Have your cholesterol levels checked more often if: Your lipid or cholesterol levels are high. You are older than 66 years of age. You are at high risk for heart disease. What should I know about cancer screening? Depending on  your health history and family history, you may need to have cancer screening at various ages. This may include screening for: Breast cancer. Cervical cancer. Colorectal cancer. Skin cancer. Lung cancer. What should I know about heart disease, diabetes, and high blood pressure? Blood pressure and heart disease High blood pressure causes heart disease and increases the risk of stroke. This is more likely to develop in people who have high blood pressure readings or are overweight. Have your blood pressure checked: Every 3-5 years if you are 53-28 years of age. Every year if you are 67 years old or older. Diabetes Have regular diabetes screenings. This checks your fasting blood sugar level. Have the screening done: Once every three years after age 3 if you are at a normal weight and have a low risk for diabetes. More often and at a younger age if you are overweight or have a high risk for diabetes. What should I know about preventing infection? Hepatitis B If you have a higher risk for hepatitis B, you should be screened for this virus. Talk with your health care provider to find out if you are at risk for hepatitis B infection. Hepatitis C Testing is recommended for: Everyone born from [birth year] through 1965. Anyone with known risk factors for hepatitis C. Sexually transmitted infections (STIs) Get screened for STIs, including gonorrhea and chlamydia, if: You are sexually active and are younger than 66 years of age. You are older than 66 years of age and your health care provider tells you that you are at risk for this type of infection. Your sexual activity has changed since you were last screened, and you are at increased risk for chlamydia or gonorrhea. Ask your health care provider if you are at risk. Ask your health care provider about whether you are at high risk for HIV. Your health care provider may recommend a prescription medicine to help prevent HIV infection. If you choose to take  medicine to prevent HIV, you should first get tested for HIV. You should then be tested every 3 months for as long as you are taking the medicine. Pregnancy If you are about to stop having your period (premenopausal) and you may become pregnant, seek counseling before you get pregnant. Take 400 to 800 micrograms (mcg) of folic acid every day if you become pregnant. Ask for birth control (contraception) if you want to prevent pregnancy. Osteoporosis and menopause Osteoporosis is a disease in which the bones lose minerals and strength with aging. This can result in bone fractures. If you are 21 years old or older, or if you are at risk for osteoporosis and fractures, ask your health care provider if you should: Be screened for bone loss. Take a calcium or vitamin D supplement to lower your risk of fractures. Be given hormone replacement therapy (HRT) to treat symptoms of menopause. Follow these instructions at home: Alcohol use Do not drink alcohol if: Your health care provider tells you not to drink. You are pregnant, may be pregnant, or are planning to become pregnant. If you drink alcohol: Limit how much you have to: 0-1 drink a day. Know how much alcohol  is in your drink. In the [place] one drink equals one 12 oz bottle of beer (355 mL), one 5 oz glass of wine (148 mL), or one 1 oz glass of hard liquor (44 mL). Lifestyle Do not use any products that contain nicotine or tobacco. These products include cigarettes, chewing tobacco, and vaping devices, such as e-cigarettes. If you need help quitting, ask your health care provider. Do not use street drugs. Do not share needles. Ask your health care provider for help if you need support or information about quitting drugs. General instructions Schedule regular health, dental, and eye exams. Stay current with your vaccines. Tell your health care provider if: You often feel depressed. You have ever been abused or do not feel safe at  home. Summary Adopting a healthy lifestyle and getting preventive care are important in promoting health and wellness. Follow your health care provider's instructions about healthy diet, exercising, and getting tested or screened for diseases. Follow your health care provider's instructions on monitoring your cholesterol and blood pressure. This information is not intended to replace advice given to you by your health care provider. Make sure you discuss any questions you have with your health care provider. Document Revised: 02/28/2021 Document Reviewed: 02/28/2021 Elsevier Patient Education  [place].

## 2022-04-03 NOTE — Progress Notes (Signed)
MEDICARE ANNUAL WELLNESS VISIT  04/03/2022  Telephone Visit Disclaimer This Medicare AWV was conducted by telephone due to national recommendations for restrictions regarding the COVID-19 Pandemic (e.g. social distancing).  I verified, using two identifiers, that I am speaking with Theresa Scott or their authorized healthcare agent. I discussed the limitations, risks, security, and privacy concerns of performing an evaluation and management service by telephone and the potential availability of an in-person appointment in the future. The patient expressed understanding and agreed to proceed.  Location of Patient: Home Location of Provider (nurse):  In the office.  Subjective:    Theresa Scott is a 66 y.o. female patient of [name], Rene Kocher, MD who had a Medicare Annual Wellness Visit today via telephone. Theresa Scott is Retired and lives with their family. she has 4 children. she reports that she is socially active and does interact with friends/family regularly. she is minimally physically active and enjoys crocheting .  Patient Care Team: Hali Marry, MD as PCP - General (Family Medicine) Lacie Scotts, MD as Consulting Physician Gatha Mayer, MD as Consulting Physician (Gastroenterology)     04/03/2022    3:13 PM 07/08/2021    4:51 PM 01/19/2021    9:18 AM 06/17/2019   11:03 AM 01/08/2018    6:32 PM 12/04/2017   12:22 PM 01/15/2017    5:11 AM  Advanced Directives  Does Patient Have a Medical Advance Directive? No No No No No No No  Would patient like information on creating a medical advance directive? No - Patient declined No - Patient declined No - Patient declined No - Patient declined  No - Patient declined     Hospital Utilization Over the Past 12 Months: # of hospitalizations or ER visits: 0 # of surgeries: 0  Review of Systems    Patient reports that her overall health is unchanged compared to last year.  History obtained from chart review and the  patient  Patient Reported Readings (BP, Pulse, CBG, Weight, etc) none  Pain Assessment Pain : No/denies pain     Current Medications & Allergies (verified) Allergies as of 04/03/2022       Reactions   Bee Venom Anaphylaxis   Loratadine Itching   REACTION: dizziness   Nsaids Other (See Comments)   GI intolerance.     Latex Rash        Medication List        Accurate as of April 03, 2022  3:22 PM. If you have any questions, ask your nurse or doctor.          Accu-Chek FastClix Lancets Misc For testing blood sugars 1-2 times daily. DX: R73.01   Accu-Chek SmartView test strip Generic drug: glucose blood TEST BLOOD SUGAR TWICE DAILY   alendronate 70 MG tablet Commonly known as: FOSAMAX Take with a full glass of water on an empty stomach.   atorvastatin 20 MG tablet Commonly known as: LIPITOR Take 1 tablet (20 mg total) by mouth daily. At bedtime   B-D SINGLE USE SWABS REGULAR Pads USE TOPICALLY  1  TO  2  TIMES  DAILY AS DIRECTED   baclofen 10 MG tablet Commonly known as: LIORESAL daily.   buPROPion 150 MG 24 hr tablet Commonly known as: Wellbutrin XL Take 1 tablet (150 mg total) by mouth daily.   COLACE PO   desvenlafaxine 100 MG 24 hr tablet Commonly known as: PRISTIQ Take 1 tablet (100 mg total) by mouth daily.  doxycycline 100 MG tablet Commonly known as: VIBRA-TABS Take 1 tablet (100 mg total) by mouth 2 (two) times daily.   EPINEPHrine 0.3 mg/0.3 mL Soaj injection Commonly known as: EPI-PEN Inject 0.3 mLs (0.3 mg total) into the muscle once.   gabapentin 300 MG capsule Commonly known as: NEURONTIN TAKE 2 CAPSULES EVERY DAY   metFORMIN 500 MG 24 hr tablet Commonly known as: GLUCOPHAGE-XR Take 1 tablet (500 mg total) by mouth daily with breakfast.   metoprolol succinate 25 MG 24 hr tablet Commonly known as: TOPROL-XL Take 1 tablet (25 mg total) by mouth daily.   omeprazole 40 MG capsule Commonly known as: PRILOSEC Take 1 capsule  (40 mg total) by mouth daily.   ondansetron 4 MG disintegrating tablet Commonly known as: ZOFRAN-ODT DISSOLVE 1 TABLET ON THE TONGUE EVERY 8 HOURS AS NEEDED FOR NAUSEA AND VOMITING   Oxycodone HCl 10 MG Tabs Take 10 mg by mouth daily.   VITAMIN B-12 PO Take 1 tablet by mouth every Wednesday.        History (reviewed): Past Medical History:  Diagnosis Date   Allergy    Anxiety    Cataract 2019   Chronic kidney disease    Complication of anesthesia    hard to wake up and swelling after kidney stone removal in 1980 per pt   Degenerative disc disease    Depression    Diabetes mellitus    Fatty liver disease, nonalcoholic    Gallstones    GERD (gastroesophageal reflux disease)    Hypercholesterolemia    Hypertension    Kidney stones    Obesity    Osteoporosis    Pancreatitis    Past Surgical History:  Procedure Laterality Date   BACK SURGERY  1990,2003,2004,2010   BREAST SURGERY     CARDIAC CATHETERIZATION  2011   [facility].    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/22/2010   HAND SURGERY  2000   right   SPINE SURGERY  1990,2001,2004,2005,2015   SUBTHALAMIC STIMULATOR BATTERY REPLACEMENT  08/16/2020   TONSILLECTOMY  1971   Family History  Problem Relation Age of Onset   Lung cancer Mother    Alcohol abuse Mother    Cancer Mother    COPD Mother    Miscarriages / [place] Mother    Cancer Sister    Diabetes Sister    Colon cancer Brother        late 76's   Alcohol abuse Brother    Cancer Brother    Liver cancer Brother    Alcohol abuse Brother    Cancer Brother    COPD Sister    Cirrhosis Sister        secondary medication   Diabetes Sister    Kidney failure Brother         Dialysis at 47, born single kidney   Alcohol abuse Daughter    Esophageal cancer Neg Hx    Stomach cancer Neg Hx    Rectal cancer Neg Hx    Social History   Socioeconomic History   Marital status: Married    Spouse name: Mateo Flow   Number of children: 4   Years of education: 9   Highest education  level: GED or equivalent  Occupational History   Occupation: [employer]    Comment: retired  Tobacco Use   Smoking status: Former    Packs/day: 0.50    Years: 18.00    Total pack years: 9.00    Types: Cigarettes    Quit date: 08/04/1990    Years since quitting: 31.6   Smokeless tobacco: Never  Vaping Use   Vaping Use: Never used  Substance and Sexual Activity   Alcohol use: No   Drug use: No   Sexual activity: Yes    Birth control/protection: None  Other Topics Concern   Not on file  Social History Narrative   Married 1 daughter (83) and 3 adopted children (16, 3, 14 months)   Disabled, chronic back pain, 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   She enjoys crocheting       Social Determinants of Health   Financial Resource Strain: Low Risk  (04/03/2022)   Overall Financial Resource Strain (CARDIA)    Difficulty of Paying Living Expenses: Not hard at all  Food Insecurity: No Food Insecurity (04/03/2022)   Hunger Vital Sign    Worried About Running Out of Food in the Last Year: Never true    Ran Out of Food in the Last Year: Never true  Transportation Needs: No Transportation Needs (04/03/2022)   PRAPARE - Hydrologist (Medical): No    Lack of Transportation (Non-Medical): No  Physical Activity: Inactive (04/03/2022)   Exercise Vital Sign    Days of Exercise per Week: 0 days    Minutes of Exercise per Session: 0 min  Stress: No Stress Concern Present (04/03/2022)   [place]    Feeling of Stress : Not at all  Social Connections: Moderately Integrated  (04/03/2022)   Social Connection and Isolation Panel [NHANES]    Frequency of Communication with Friends and Family: More than three times a week    Frequency of Social Gatherings with Friends and Family: More than three times a week    Attends Religious Services: More than 4 times per year    Active Member of [employer] or Organizations: No    Attends Archivist Meetings: Never    Marital Status: Married    Activities of Daily Living    04/01/2022    2:51 AM  In your present state of health, do you have any difficulty performing the following activities:  Hearing? 0  Vision? 0  Difficulty concentrating or making decisions? 0  Walking or climbing stairs? 0  Dressing or bathing? 0  Doing errands, shopping? 0  Preparing Food and eating ? N  Using the Toilet? N  In the past six months, have you accidently leaked urine? N  Do you have problems with loss of bowel control? N  Managing your Medications? N  Managing your Finances? N  Housekeeping or managing your Housekeeping? N    Patient Education/ Literacy How often do you need to have someone help you when you read instructions, pamphlets, or other written materials from your doctor or pharmacy?: 1 - Never What is the last grade level you completed in school?: GED  Exercise Current Exercise Habits: The patient does not participate in regular exercise at present, Exercise limited by: None identified  Diet Patient reports consuming 2 meals a day and 2-3 snack(s) a day Patient reports that her primary diet is: Regular Patient reports that she does have regular access to  food.   Depression Screen    04/03/2022    3:13 PM 12/19/2021    3:41 PM 03/03/2021    9:56 AM 01/19/2021    9:24 AM 03/11/2020    9:32 AM 06/17/2019   11:04 AM 06/05/2019    9:06 AM  PHQ 2/9 Scores  PHQ - 2 Score 0 2 2 0 0 0 0  PHQ- 9 Score  11 5         Fall Risk    04/01/2022    2:51 AM 03/03/2021    9:55 AM 01/19/2021    9:18 AM 06/17/2019   11:04  AM 06/05/2019    9:05 AM  Fall Risk   Falls in the past year? '1 1 1 '$ 0 0  Number falls in past yr: 0 0 1  0  Injury with Fall? 1 0 0  0  Risk for fall due to :  Impaired balance/gait Impaired balance/gait    Follow up  Falls prevention discussed;Falls evaluation completed Falls evaluation completed;Education provided;Falls prevention discussed Falls prevention discussed      Objective:  Theresa Scott seemed alert and oriented and she participated appropriately during our telephone visit.  Blood Pressure Weight BMI  BP Readings from Last 3 Encounters:  12/19/21 135/86  08/24/21 (!) 155/84  07/08/21 (!) 158/92   Wt Readings from Last 3 Encounters:  12/19/21 178 lb (80.7 kg)  08/24/21 189 lb (85.7 kg)  07/08/21 184 lb 1.4 oz (83.5 kg)   BMI Readings from Last 1 Encounters:  12/19/21 29.62 kg/m    *Unable to obtain current vital signs, weight, and BMI due to telephone visit type  Hearing/Vision  Theresa Scott did not seem to have difficulty with hearing/understanding during the telephone conversation Reports that she has not had a formal eye exam by an eye care professional within the past year Reports that she has not had a formal hearing evaluation within the past year *Unable to fully assess hearing and vision during telephone visit type  Cognitive Function:    04/03/2022    3:17 PM 01/19/2021    9:26 AM 06/17/2019   11:07 AM 06/05/2019    9:37 AM  6CIT Screen  What Year? 0 points 0 points 0 points 0 points  What month? 0 points 0 points 0 points 0 points  What time? 0 points 0 points 0 points 0 points  Count back from 20 0 points 0 points 0 points 0 points  Months in reverse 0 points 0 points 0 points 0 points  Repeat phrase 0 points 0 points 2 points 2 points  Total Score 0 points 0 points 2 points 2 points   (Normal:0-7, Significant for Dysfunction: >8)  Normal Cognitive Function Screening: Yes   Immunization & Health Maintenance Record Immunization History  Administered  Date(s) Administered   Hep A / Hep B 08/06/2018, 11/27/2018   Influenza Split 08/15/2011, 09/09/2012   Influenza Whole 07/20/2008, 08/12/2009, 08/03/2010   Influenza, High Dose Seasonal PF 07/18/2017, 06/07/2019   Influenza,inj,Quad PF,6+ Mos 06/26/2013, 09/15/2014, 07/01/2015, 08/21/2016, 08/06/2018, 06/07/2019   PFIZER(Purple Top)SARS-COV-2 Vaccination 06/10/2020, 07/01/2020   Pneumococcal Polysaccharide-23 08/06/2018   Pneumococcal-Unspecified 10/23/2001   Tdap 11/19/2015    Health Maintenance  Topic Date Due   COVID-19 Vaccine (3 - Pfizer risk series) 04/19/2022 (Originally 07/29/2020)   URINE MICROALBUMIN  05/03/2022 (Originally 11/28/2019)   Zoster Vaccines- Shingrix (1 of 2) 07/04/2022 (Originally 04/12/1975)   Pneumonia Vaccine 5+ Years old (2 - PCV) 12/19/2022 (Originally 08/07/2019)  INFLUENZA VACCINE  05/23/2022   DEXA SCAN  07/21/2023   MAMMOGRAM  09/22/2023   TETANUS/TDAP  11/18/2025   PAP SMEAR-Modifier  03/03/2026   COLONOSCOPY (Pts 45-12yr Insurance coverage will need to be confirmed)  12/21/2027   Hepatitis C Screening  Completed   HIV Screening  Completed   HPV VACCINES  Aged Out       Assessment  This is a routine wellness examination for Theresa Scott  Health Maintenance: Due or Overdue There are no preventive care reminders to display for this patient.   Theresa Nestdoes not need a referral for Community Assistance: Care Management:   no Social Work:    no Prescription Assistance:  no Nutrition/Diabetes Education:  no   Plan:  Personalized Goals  Goals Addressed               This Visit's Progress     Patient Stated (pt-stated)        Patient stated that she would like to loose 20-30 lbs.       Personalized Health Maintenance & Screening Recommendations  Pneumococcal vaccine  Shingrix  Urine Microalbumin  Lung Cancer Screening Recommended: no (Low Dose CT Chest recommended if Age 66-80years, 30 pack-year currently smoking OR  have quit w/in past 15 years) Hepatitis C Screening recommended: no HIV Screening recommended: no  Advanced Directives: Written information was not prepared per patient's request.  Referrals & Orders No orders of the defined types were placed in this encounter.   Follow-up Plan Follow-up with MHali Marry MD as planned Please schedule your pneumococcal vaccine and shingrix vaccine. Medicare wellness visit in one year.  Patient will access AVS on my chart.   I have personally reviewed and noted the following in the patient's chart:   Medical and social history Use of alcohol, tobacco or illicit drugs  Current medications and supplements Functional ability and status Nutritional status Physical activity Advanced directives List of other physicians Hospitalizations, surgeries, and ER visits in previous 12 months Vitals Screenings to include cognitive, depression, and falls Referrals and appointments  In addition, I have reviewed and discussed with Theresa Nestcertain preventive protocols, quality metrics, and best practice recommendations. A written personalized care plan for preventive services as well as general preventive health recommendations is available and can be mailed to the patient at her request.      BTinnie Gens RN-BSN  04/03/2022

## 2022-04-04 ENCOUNTER — Ambulatory Visit (INDEPENDENT_AMBULATORY_CARE_PROVIDER_SITE_OTHER): Payer: Medicare Other | Admitting: Family Medicine

## 2022-04-04 VITALS — BP 130/75 | HR 76

## 2022-04-04 DIAGNOSIS — R8290 Unspecified abnormal findings in urine: Secondary | ICD-10-CM | POA: Diagnosis not present

## 2022-04-04 DIAGNOSIS — R350 Frequency of micturition: Secondary | ICD-10-CM

## 2022-04-04 LAB — POCT URINALYSIS DIPSTICK
Bilirubin, UA: NEGATIVE
Glucose, UA: NEGATIVE
Ketones, UA: NEGATIVE
Nitrite, UA: NEGATIVE
Protein, UA: POSITIVE — AB
Spec Grav, UA: >=1.03 — AB (ref 1.010–1.025)
Urobilinogen, UA: 1 E.U./dL
pH, UA: 5 (ref 5.0–8.0)

## 2022-04-04 MED ORDER — NITROFURANTOIN MONOHYD MACRO 100 MG PO CAPS: 100.0000 mg | ORAL_CAPSULE | Freq: Two times a day (BID) | ORAL | 0 refills | Status: DC

## 2022-04-04 NOTE — Progress Notes (Signed)
Pt c/o gross hematuria with onset 4 days ago, malodorous urine, urinary frequency and urgency.  Pt denies fever, chills, N/V or flank pain.  POC UA performed and sample sent for culture.  Charyl Bigger, CMA

## 2022-04-04 NOTE — Progress Notes (Signed)
UTI - will treat with Macrobid. Will send for Culture.    Meds ordered this encounter  Medications   nitrofurantoin, macrocrystal-monohydrate, (MACROBID) 100 MG capsule    Sig: Take 1 capsule (100 mg total) by mouth 2 (two) times daily.    Dispense:  10 capsule    Refill:  0

## 2022-04-07 ENCOUNTER — Telehealth: Payer: Self-pay

## 2022-04-07 ENCOUNTER — Other Ambulatory Visit: Payer: Self-pay | Admitting: *Deleted

## 2022-04-07 LAB — CULTURE, URINE COMPREHENSIVE
MICRO NUMBER:: 13523147
SPECIMEN QUALITY:: ADEQUATE

## 2022-04-07 NOTE — Telephone Encounter (Signed)
Patient was contacted regarding delivery of her medication from [facility]. Patient informed that medication is available for pick up at the front desk ( handed to [place]).   Pristiq [place] [id number] Lot # [id number] Exp: 11/23/2023

## 2022-04-07 NOTE — Progress Notes (Signed)
E.coli on culture. Macrobid should treat. Complete all day. Follow up if symptoms not improving or worsening. Drink plenty of fluids.

## 2022-04-12 ENCOUNTER — Other Ambulatory Visit: Payer: Self-pay | Admitting: *Deleted

## 2022-04-12 MED ORDER — BUPROPION HCL ER (XL) 150 MG PO TB24: 150.0000 mg | ORAL_TABLET | Freq: Every day | ORAL | 1 refills | Status: DC

## 2022-05-03 ENCOUNTER — Ambulatory Visit (INDEPENDENT_AMBULATORY_CARE_PROVIDER_SITE_OTHER): Payer: Medicare Other | Admitting: Family Medicine

## 2022-05-03 VITALS — BP 137/80 | HR 67 | Temp 98.3°F

## 2022-05-03 DIAGNOSIS — R300 Dysuria: Secondary | ICD-10-CM | POA: Diagnosis not present

## 2022-05-03 DIAGNOSIS — R7301 Impaired fasting glucose: Secondary | ICD-10-CM

## 2022-05-03 LAB — POCT URINALYSIS DIP (CLINITEK)
Bilirubin, UA: NEGATIVE
Blood, UA: NEGATIVE
Glucose, UA: NEGATIVE mg/dL
Ketones, POC UA: NEGATIVE mg/dL
Nitrite, UA: NEGATIVE
POC PROTEIN,UA: NEGATIVE
Spec Grav, UA: 1.025 (ref 1.010–1.025)
Urobilinogen, UA: 1 E.U./dL
pH, UA: 6 (ref 5.0–8.0)

## 2022-05-03 NOTE — Progress Notes (Signed)
Last urine culture pos for e coli on 6/13.  Will repeat culture today since have persistent and new sxs.    Beatrice Lecher, MD

## 2022-05-03 NOTE — Progress Notes (Signed)
Pt came in reporting that she is still experiencing burning,odor with urination. She also c/o back pain. She stated that this has been ongoing since she was last seen for UTI sxs.

## 2022-05-04 ENCOUNTER — Ambulatory Visit: Payer: PRIVATE HEALTH INSURANCE

## 2022-05-04 LAB — MICROALBUMIN / CREATININE URINE RATIO
Creatinine, Urine: 82 mg/dL (ref 20–275)
Microalb Creat Ratio: 5 mcg/mg creat (ref <30)
Microalb, Ur: 0.4 mg/dL

## 2022-05-05 LAB — URINE CULTURE
MICRO NUMBER:: 13640329
SPECIMEN QUALITY:: ADEQUATE

## 2022-05-05 NOTE — Progress Notes (Signed)
I think it may just be something going on with her back then so we may need to have her come in.  We can always repeat the urine again if she still having burning.

## 2022-05-05 NOTE — Progress Notes (Signed)
Hi Theresa Scott, the urine culture just grew out a skin bacteria.  Are you still having any symptoms?

## 2022-05-11 ENCOUNTER — Other Ambulatory Visit: Payer: Self-pay | Admitting: Orthopaedic Surgery

## 2022-05-11 DIAGNOSIS — G894 Chronic pain syndrome: Secondary | ICD-10-CM | POA: Diagnosis not present

## 2022-05-11 DIAGNOSIS — M4326 Fusion of spine, lumbar region: Secondary | ICD-10-CM | POA: Diagnosis not present

## 2022-05-21 ENCOUNTER — Emergency Department (HOSPITAL_COMMUNITY)
Admission: EM | Admit: 2022-05-21 | Discharge: 2022-05-22 | Discharge status: Against Medical Advice | Payer: Medicare Other | Attending: Emergency Medicine | Admitting: Emergency Medicine

## 2022-05-21 ENCOUNTER — Emergency Department (HOSPITAL_COMMUNITY): Payer: Medicare Other

## 2022-05-21 DIAGNOSIS — S90511A Abrasion, right ankle, initial encounter: Secondary | ICD-10-CM | POA: Diagnosis not present

## 2022-05-21 DIAGNOSIS — W5503XA Scratched by cat, initial encounter: Secondary | ICD-10-CM | POA: Insufficient documentation

## 2022-05-21 DIAGNOSIS — Z5321 Procedure and treatment not carried out due to patient leaving prior to being seen by health care provider: Secondary | ICD-10-CM | POA: Insufficient documentation

## 2022-05-21 NOTE — ED Triage Notes (Signed)
Pt c/o cat scratch to R ankle/foot. Pt states blood was "shooting" from medial aspect of ankle. Pt denies blood thinners, able to bear weight but hasn't "to protect it."

## 2022-05-22 NOTE — ED Notes (Signed)
Patients label were laying on computer when this writer came back from taking a patient to a room with a message that says "patient left ED."

## 2022-05-23 ENCOUNTER — Telehealth: Payer: Self-pay | Admitting: General Practice

## 2022-05-23 NOTE — Telephone Encounter (Signed)
Transition Care Management Follow-up Telephone Call Date of discharge and from where: 05/22/22 from [facility] How have you been since you were released from the hospital? She is doing ok. She waited ab out four hours to be seen but was told it would be another two more. She said she ended up leaving before she was seen. Bleeding has stopped. She said that she will call back as needed. Any questions or concerns? No  Items Reviewed: Did the pt receive and understand the discharge instructions provided? Yes  Medications obtained and verified? No  Other? No  Any new allergies since your discharge? No  Dietary orders reviewed? Yes Do you have support at home? Yes   Home Care and Equipment/Supplies: Were home health services ordered? no  Functional Questionnaire: (I = Independent and D = Dependent) ADLs: I  Bathing/Dressing- I  Meal Prep- I  Eating- I  Maintaining continence- I  Transferring/Ambulation- I  Managing Meds- I  Follow up appointments reviewed:  PCP Hospital f/u appt confirmed? No   Specialist Hospital f/u appt confirmed? No   Are transportation arrangements needed? No  If their condition worsens, is the pt aware to call PCP or go to the Emergency Dept.? Yes Was the patient provided with contact information for the PCP's office or ED? Yes Was to pt encouraged to call back with questions or concerns? Yes

## 2022-05-27 ENCOUNTER — Other Ambulatory Visit: Payer: PRIVATE HEALTH INSURANCE

## 2022-06-01 ENCOUNTER — Ambulatory Visit
Admission: RE | Admit: 2022-06-01 | Discharge: 2022-06-01 | Disposition: A | Discharge status: Home / Self Care | Payer: Medicare Other | Source: Ambulatory Visit | Attending: Orthopaedic Surgery | Admitting: Orthopaedic Surgery

## 2022-06-01 DIAGNOSIS — G894 Chronic pain syndrome: Secondary | ICD-10-CM

## 2022-06-12 ENCOUNTER — Other Ambulatory Visit: Payer: Self-pay

## 2022-06-12 DIAGNOSIS — R7301 Impaired fasting glucose: Secondary | ICD-10-CM

## 2022-06-12 MED ORDER — ACCU-CHEK SMARTVIEW VI STRP: ORAL_STRIP | 11 refills | Status: DC

## 2022-06-12 MED ORDER — ACCU-CHEK FASTCLIX LANCETS MISC: 4 refills | Status: DC

## 2022-06-13 ENCOUNTER — Other Ambulatory Visit: Payer: Self-pay

## 2022-06-13 DIAGNOSIS — R7301 Impaired fasting glucose: Secondary | ICD-10-CM

## 2022-06-13 MED ORDER — ACCU-CHEK SMARTVIEW VI STRP: ORAL_STRIP | 11 refills | Status: DC

## 2022-06-13 MED ORDER — ACCU-CHEK FASTCLIX LANCETS MISC: 4 refills | Status: DC

## 2022-06-20 ENCOUNTER — Telehealth: Payer: Self-pay

## 2022-06-20 DIAGNOSIS — F322 Major depressive disorder, single episode, severe without psychotic features: Secondary | ICD-10-CM

## 2022-06-20 MED ORDER — ACCU-CHEK SMARTVIEW VI STRP: ORAL_STRIP | 11 refills | Status: DC

## 2022-06-20 MED ORDER — DESVENLAFAXINE SUCCINATE ER 100 MG PO TB24: 100.0000 mg | ORAL_TABLET | Freq: Every day | ORAL | 1 refills | Status: DC

## 2022-06-20 NOTE — Telephone Encounter (Signed)
Pt called and  left a vm about needing a refill on pristiq and test strips going to [facility]

## 2022-06-22 DIAGNOSIS — G894 Chronic pain syndrome: Secondary | ICD-10-CM | POA: Diagnosis not present

## 2022-06-22 DIAGNOSIS — M4326 Fusion of spine, lumbar region: Secondary | ICD-10-CM | POA: Diagnosis not present

## 2022-06-24 ENCOUNTER — Other Ambulatory Visit: Payer: Self-pay | Admitting: Family Medicine

## 2022-06-29 ENCOUNTER — Telehealth: Payer: Self-pay | Admitting: Neurology

## 2022-06-29 DIAGNOSIS — H43813 Vitreous degeneration, bilateral: Secondary | ICD-10-CM | POA: Diagnosis not present

## 2022-06-29 DIAGNOSIS — E119 Type 2 diabetes mellitus without complications: Secondary | ICD-10-CM | POA: Diagnosis not present

## 2022-06-29 LAB — HM DIABETES EYE EXAM

## 2022-06-29 NOTE — Telephone Encounter (Signed)
Called patient and scheduled.

## 2022-06-29 NOTE — Telephone Encounter (Signed)
Patient called and LVM that she is having a lot of fatigue, would like to discuss.   Please call patient and schedule appt to discuss with Dr. Madilyn Fireman.

## 2022-07-03 ENCOUNTER — Ambulatory Visit (INDEPENDENT_AMBULATORY_CARE_PROVIDER_SITE_OTHER): Payer: Medicare Other | Admitting: Family Medicine

## 2022-07-03 ENCOUNTER — Encounter: Payer: Self-pay | Admitting: Family Medicine

## 2022-07-03 VITALS — BP 145/71 | HR 64 | Wt 181.0 lb

## 2022-07-03 DIAGNOSIS — R5383 Other fatigue: Secondary | ICD-10-CM

## 2022-07-03 DIAGNOSIS — R600 Localized edema: Secondary | ICD-10-CM | POA: Diagnosis not present

## 2022-07-03 DIAGNOSIS — Z23 Encounter for immunization: Secondary | ICD-10-CM

## 2022-07-03 DIAGNOSIS — F322 Major depressive disorder, single episode, severe without psychotic features: Secondary | ICD-10-CM

## 2022-07-03 DIAGNOSIS — R945 Abnormal results of liver function studies: Secondary | ICD-10-CM | POA: Diagnosis not present

## 2022-07-03 DIAGNOSIS — M7989 Other specified soft tissue disorders: Secondary | ICD-10-CM | POA: Diagnosis not present

## 2022-07-03 DIAGNOSIS — N3941 Urge incontinence: Secondary | ICD-10-CM

## 2022-07-03 DIAGNOSIS — R7301 Impaired fasting glucose: Secondary | ICD-10-CM | POA: Diagnosis not present

## 2022-07-03 DIAGNOSIS — R300 Dysuria: Secondary | ICD-10-CM

## 2022-07-03 DIAGNOSIS — D649 Anemia, unspecified: Secondary | ICD-10-CM | POA: Diagnosis not present

## 2022-07-03 DIAGNOSIS — R0789 Other chest pain: Secondary | ICD-10-CM | POA: Diagnosis not present

## 2022-07-03 MED ORDER — DESVENLAFAXINE SUCCINATE ER 50 MG PO TB24: 50.0000 mg | ORAL_TABLET | Freq: Every day | ORAL | 0 refills | Status: DC

## 2022-07-03 NOTE — Progress Notes (Signed)
Established Patient Office Visit  Subjective   Patient ID: Theresa Scott, female    DOB: April 08, 1956  Age: 66 y.o. MRN: [id number]  Chief Complaint  Patient presents with   Fatigue    Been going on for 6 months and states can fall asleep very fast.    HPI  Patient is here today because she reports significant fatigue for about 6 months.  She has not been sleeping well she is only been getting about 5 hours per night but falls asleep easily during the daytime.  No prior history of sleep apnea in fact she has been evaluated twice.  She has been taking her multivitamin, B12, iron and vitamin D regularly.  She has not noticed any blood in the urine or stool.  She has noticed some urinary incontinence that also started about 6 months ago with some occasional dysuria.  No fevers chills or night sweats.  No numbness or tingling.  No shortness of breath.  She has noticed some intermittent chest pressure.  She says it seems to be random.  Fatigue x 6 months.  Falls asleep easily.      ROS    Objective:     BP (!) 145/71   Pulse 64   Wt 181 lb (82.1 kg)   SpO2 95%   BMI 30.12 kg/m    Physical Exam Vitals and nursing note reviewed.  Constitutional:      Appearance: She is well-developed.  HENT:     Head: Normocephalic and atraumatic.     Right Ear: Ear canal and external ear normal.     Left Ear: Tympanic membrane, ear canal and external ear normal.     Ears:     Comments: Right TM blocked by cerumen.     Nose: Nose normal.     Mouth/Throat:     Pharynx: Oropharynx is clear.  Eyes:     Conjunctiva/sclera: Conjunctivae normal.     Pupils: Pupils are equal, round, and reactive to light.  Neck:     Thyroid: No thyromegaly.  Cardiovascular:     Rate and Rhythm: Normal rate and regular rhythm.     Heart sounds: Normal heart sounds.  Pulmonary:     Effort: Pulmonary effort is normal.     Breath sounds: Normal breath sounds. No wheezing.  Abdominal:     General: Bowel sounds  are normal.     Palpations: Abdomen is soft.     Tenderness: There is no abdominal tenderness.  Musculoskeletal:     Cervical back: Neck supple.  Lymphadenopathy:     Cervical: No cervical adenopathy.  Skin:    General: Skin is warm and dry.  Neurological:     Mental Status: She is alert and oriented to person, place, and time.  Psychiatric:        Behavior: Behavior normal.    No sign of volume overload on exam.   No results found for any visits on 07/03/22.    The ASCVD Risk score (Arnett DK, et al., 2019) failed to calculate for the following reasons:   The valid total cholesterol range is 130 to 320 mg/dL    Assessment & Plan:   Problem List Items Addressed This Visit       Endocrine   IMPAIRED FASTING GLUCOSE   Relevant Orders   Hemoglobin A1c     Other   Major depressive disorder, single episode, severe without psychotic features ([place])    She would like to try decreasing  her dose on the Pristiq.  New prescription sent for 50 mg she also wants to make sure that is not causing any increase sedation.      Relevant Medications   desvenlafaxine (PRISTIQ) 50 MG 24 hr tablet   Other Visit Diagnoses     Other fatigue    -  Primary   Relevant Orders   COMPLETE METABOLIC PANEL WITH GFR   CBC with Differential/Platelet   Hemoglobin A1c   Fe+TIBC+Fer   B Nat Peptide   Sedimentation rate   C-reactive protein   B12   Urinalysis, Routine w reflex microscopic   Urine Culture   Dysuria       Relevant Orders   COMPLETE METABOLIC PANEL WITH GFR   CBC with Differential/Platelet   Hemoglobin A1c   Fe+TIBC+Fer   B Nat Peptide   Sedimentation rate   C-reactive protein   B12   Urinalysis, Routine w reflex microscopic   Urine Culture   Atypical chest pain       Relevant Orders   COMPLETE METABOLIC PANEL WITH GFR   CBC with Differential/Platelet   Hemoglobin A1c   Fe+TIBC+Fer   B Nat Peptide   Sedimentation rate   C-reactive protein   B12   Urinalysis,  Routine w reflex microscopic   Urine Culture   Swelling of both lower extremities       Relevant Orders   COMPLETE METABOLIC PANEL WITH GFR   CBC with Differential/Platelet   Hemoglobin A1c   Fe+TIBC+Fer   B Nat Peptide   Sedimentation rate   C-reactive protein   B12   Urinalysis, Routine w reflex microscopic   Urine Culture   Localized edema       Relevant Orders   COMPLETE METABOLIC PANEL WITH GFR   CBC with Differential/Platelet   Hemoglobin A1c   Fe+TIBC+Fer   B Nat Peptide   Sedimentation rate   C-reactive protein   B12   Urinalysis, Routine w reflex microscopic   Urine Culture   Urge incontinence       Influenza vaccine needed       Relevant Orders   Flu Vaccine QUAD High Dose(Fluad) (Completed)       Fatigue-unclear etiology at this point she is certainly impacted by poor sleep quality at night which certainly could be contributing.  She also wonders if it could be a medication side effect and is interested in possibly coming down on the Pristiq to see if that is helpful I think that would be reasonable.  She has been on the gabapentin for about 4 years and that dose has not changed recently in fact she is really only taking 1 tab a day instead of 2.  We will get an EKG today since she has been having some intermittent chest pain we will check for anemia since she does have pale conjunctive eye on exam.  Urgency incontinence -start with a urinalysis it sounds like it is pretty significant continence where she will really just lose her whole bladder this also started about 6 months ago which is a little unusual.  If the urinalysis is normal consider referral to Urology for further work-up  Atypical chest pain-not currently having chest pain.  EKG performed today.  Rate of 60 bpm, normal sinus rhythm.  No acute ST elevation.  She does have a pain stimulator and so I do feel like it is causing some artifact.  Return in about 4 weeks (around 07/31/2022) for fatigue.  Beatrice Lecher, MD

## 2022-07-03 NOTE — Assessment & Plan Note (Signed)
She would like to try decreasing her dose on the Pristiq.  New prescription sent for 50 mg she also wants to make sure that is not causing any increase sedation.

## 2022-07-04 NOTE — Progress Notes (Signed)
Hi Theresa Scott, platelets have dropped again similar to about 2 years ago and wondering if this could be related to the fatigue you are having though your white blood cell count and hemoglobin actually look good.  Your B12 is actually high.  I think you are just taking it once a week which is fine.  If you are taking it multiple days a week then decrease how often you are using the vitamin B12.  Liver function is also mildly elevated.  We will see if the lab can add an acute hepatitis panel.  Your A1c looks great at 6.0.  Inflammatory markers negative.  First look great.  No sign of volume overload such as heart failure which is reassuring.  Still awaiting the urine culture.  Please call the lab and see if we can add an acute hepatitis panel.

## 2022-07-05 ENCOUNTER — Telehealth: Payer: Self-pay | Admitting: *Deleted

## 2022-07-05 NOTE — Telephone Encounter (Signed)
Lvm informing pt that the order for her medication was called in.

## 2022-07-05 NOTE — Progress Notes (Signed)
Hi Ltanya, your urine culture came back negative.  We are still waiting for them to add on the hepatitis panel.  Hopefully they had enough blood to do it it will likely be back either today or tomorrow.  Encouraged her to continue to work on getting some quality sleep at night we really need to work on getting him more than 5 hours otherwise it makes it more confusing for what is contributing to your fatigue during the day.

## 2022-07-05 NOTE — Addendum Note (Signed)
Addended by: Narda Rutherford on: 07/05/2022 07:21 AM   Modules accepted: Orders

## 2022-07-05 NOTE — Telephone Encounter (Signed)
Called [employer] patient asst. For her refill of Pristiq 100 mg #90.  Confirmation # [id number] her order should arrive in 7-10 days and she will be due for her next order by Monday 09/11/2022.

## 2022-07-05 NOTE — Progress Notes (Signed)
Hi Theresa Scott, hepatitis panel is normal so this is reassuring.  Like to get an ultrasound of the liver if you are okay with that please let me know and we will see if we can get you scheduled downstairs it looks like the last time we checked it was in 2019 so it has been about 4 years we can get ultrasound with elastography.

## 2022-07-06 ENCOUNTER — Other Ambulatory Visit: Payer: Self-pay | Admitting: *Deleted

## 2022-07-06 DIAGNOSIS — K760 Fatty (change of) liver, not elsewhere classified: Secondary | ICD-10-CM

## 2022-07-06 LAB — CBC WITH DIFFERENTIAL/PLATELET
Absolute Monocytes: 763 cells/uL (ref 200–950)
Basophils Absolute: 31 cells/uL (ref 0–200)
Basophils Relative: 0.5 %
Eosinophils Absolute: 99 cells/uL (ref 15–500)
Eosinophils Relative: 1.6 %
HCT: 39.9 % (ref 35.0–45.0)
Hemoglobin: 13.7 g/dL (ref 11.7–15.5)
Lymphs Abs: 1364 cells/uL (ref 850–3900)
MCH: 31.4 pg (ref 27.0–33.0)
MCHC: 34.3 g/dL (ref 32.0–36.0)
MCV: 91.3 fL (ref 80.0–100.0)
MPV: 13 fL — ABNORMAL HIGH (ref 7.5–12.5)
Monocytes Relative: 12.3 %
Neutro Abs: 3943 cells/uL (ref 1500–7800)
Neutrophils Relative %: 63.6 %
Platelets: 127 10*3/uL — ABNORMAL LOW (ref 140–400)
RBC: 4.37 10*6/uL (ref 3.80–5.10)
RDW: 12.2 % (ref 11.0–15.0)
Total Lymphocyte: 22 %
WBC: 6.2 10*3/uL (ref 3.8–10.8)

## 2022-07-06 LAB — VITAMIN B12: Vitamin B-12: >2000 pg/mL — ABNORMAL HIGH (ref 200–1100)

## 2022-07-06 LAB — HEMOGLOBIN A1C
Hgb A1c MFr Bld: 6 % of total Hgb — ABNORMAL HIGH (ref <5.7)
Mean Plasma Glucose: 126 mg/dL
eAG (mmol/L): 7 mmol/L

## 2022-07-06 LAB — SEDIMENTATION RATE: Sed Rate: 2 mm/h (ref 0–30)

## 2022-07-06 LAB — URINALYSIS, ROUTINE W REFLEX MICROSCOPIC
Bacteria, UA: NONE SEEN /HPF
Bilirubin Urine: NEGATIVE
Glucose, UA: NEGATIVE
Hgb urine dipstick: NEGATIVE
Hyaline Cast: NONE SEEN /LPF
Ketones, ur: NEGATIVE
Nitrite: NEGATIVE
Protein, ur: NEGATIVE
RBC / HPF: NONE SEEN /HPF (ref 0–2)
Specific Gravity, Urine: 1.01 (ref 1.001–1.035)
Squamous Epithelial / HPF: NONE SEEN /HPF (ref <=5)
pH: 6 (ref 5.0–8.0)

## 2022-07-06 LAB — COMPLETE METABOLIC PANEL WITH GFR
AG Ratio: 1.5 (calc) (ref 1.0–2.5)
ALT: 35 U/L — ABNORMAL HIGH (ref 6–29)
AST: 36 U/L — ABNORMAL HIGH (ref 10–35)
Albumin: 4.5 g/dL (ref 3.6–5.1)
Alkaline phosphatase (APISO): 66 U/L (ref 37–153)
BUN: 13 mg/dL (ref 7–25)
CO2: 31 mmol/L (ref 20–32)
Calcium: 9.7 mg/dL (ref 8.6–10.4)
Chloride: 106 mmol/L (ref 98–110)
Creat: 0.82 mg/dL (ref 0.50–1.05)
Globulin: 3.1 g/dL (calc) (ref 1.9–3.7)
Glucose, Bld: 104 mg/dL (ref 65–139)
Potassium: 4.6 mmol/L (ref 3.5–5.3)
Sodium: 142 mmol/L (ref 135–146)
Total Bilirubin: 0.5 mg/dL (ref 0.2–1.2)
Total Protein: 7.6 g/dL (ref 6.1–8.1)
eGFR: 79 mL/min/{1.73_m2} (ref >=60)

## 2022-07-06 LAB — TEST AUTHORIZATION

## 2022-07-06 LAB — ACUTE HEP PANEL AND HEP B SURFACE AB
HEPATITIS C ANTIBODY REFILL$(REFL): NONREACTIVE
Hep A IgM: NONREACTIVE
Hep B C IgM: NONREACTIVE
Hepatitis B Surface Ag: NONREACTIVE

## 2022-07-06 LAB — URINE CULTURE
MICRO NUMBER:: 13899516
Result:: NO GROWTH
SPECIMEN QUALITY:: ADEQUATE

## 2022-07-06 LAB — BRAIN NATRIURETIC PEPTIDE: Brain Natriuretic Peptide: 79 pg/mL (ref <100)

## 2022-07-06 LAB — IRON,TIBC AND FERRITIN PANEL
%SAT: 42 % (calc) (ref 16–45)
Ferritin: 49 ng/mL (ref 16–288)
Iron: 135 ug/dL (ref 45–160)
TIBC: 318 mcg/dL (calc) (ref 250–450)

## 2022-07-06 LAB — HEPATITIS PANEL, ACUTE
Hep A IgM: NONREACTIVE
Hep B C IgM: NONREACTIVE
Hepatitis B Surface Ag: NONREACTIVE
Hepatitis C Ab: NONREACTIVE

## 2022-07-06 LAB — REFLEX TIQ

## 2022-07-06 LAB — C-REACTIVE PROTEIN: CRP: 0.4 mg/L (ref <8.0)

## 2022-07-11 ENCOUNTER — Ambulatory Visit: Payer: Medicare Other

## 2022-07-11 DIAGNOSIS — K760 Fatty (change of) liver, not elsewhere classified: Secondary | ICD-10-CM | POA: Diagnosis not present

## 2022-07-11 DIAGNOSIS — K7689 Other specified diseases of liver: Secondary | ICD-10-CM | POA: Diagnosis not present

## 2022-07-12 ENCOUNTER — Other Ambulatory Visit: Payer: Self-pay | Admitting: *Deleted

## 2022-07-12 DIAGNOSIS — K760 Fatty (change of) liver, not elsewhere classified: Secondary | ICD-10-CM

## 2022-07-12 NOTE — Progress Notes (Signed)
Theresa Scott, your ultrasound is most consistent with showing some chronic liver disease so I really want to get you in with GI.  Please let me know if you have a preference for provider or location.

## 2022-07-13 ENCOUNTER — Telehealth: Payer: Self-pay | Admitting: Family Medicine

## 2022-07-13 NOTE — Telephone Encounter (Signed)
Received 1 bottle of Pristiq at 11:53 on 07/13/2022 patient assist. I called patient and let her know medication is here for her to pick up - CF

## 2022-07-18 ENCOUNTER — Telehealth: Payer: Self-pay | Admitting: Internal Medicine

## 2022-07-18 NOTE — Telephone Encounter (Signed)
Inbound call from patient stating that she has a referral for nonalcoholic fatty liver disease. Patient was given Dr. Marla Roe next available appointment date 12/1 and she stated she did not want to wait that long. Patient is requesting a call back to discuss. Please advise.

## 2022-07-18 NOTE — Telephone Encounter (Signed)
Theresa Scott, patient can be scheduled for a sooner appt with an APP if available. If patient declines an appt with an APP she will need to be scheduled for next available appt with Dr. Carlean Purl. This is not urgent. Thank you

## 2022-07-20 ENCOUNTER — Encounter: Payer: Self-pay | Admitting: Physician Assistant

## 2022-07-20 NOTE — Telephone Encounter (Signed)
LVM for patient that she can schedule with PA for a sooner appointment.

## 2022-07-21 NOTE — Telephone Encounter (Signed)
Medication has been picked up - CF

## 2022-07-31 ENCOUNTER — Ambulatory Visit (INDEPENDENT_AMBULATORY_CARE_PROVIDER_SITE_OTHER): Payer: Medicare Other

## 2022-07-31 ENCOUNTER — Ambulatory Visit (INDEPENDENT_AMBULATORY_CARE_PROVIDER_SITE_OTHER): Payer: Medicare Other | Admitting: Family Medicine

## 2022-07-31 VITALS — BP 126/69 | HR 63 | Ht 65.0 in | Wt 184.0 lb

## 2022-07-31 DIAGNOSIS — R21 Rash and other nonspecific skin eruption: Secondary | ICD-10-CM

## 2022-07-31 DIAGNOSIS — K7400 Hepatic fibrosis, unspecified: Secondary | ICD-10-CM | POA: Diagnosis not present

## 2022-07-31 DIAGNOSIS — M25552 Pain in left hip: Secondary | ICD-10-CM

## 2022-07-31 DIAGNOSIS — M1612 Unilateral primary osteoarthritis, left hip: Secondary | ICD-10-CM | POA: Diagnosis not present

## 2022-07-31 DIAGNOSIS — K760 Fatty (change of) liver, not elsewhere classified: Secondary | ICD-10-CM

## 2022-07-31 NOTE — Progress Notes (Signed)
Established Patient Office Visit  Subjective   Patient ID: Theresa Scott, female    DOB: [birth year]  Age: 66 y.o. MRN: [id number]  Chief Complaint  Patient presents with   Follow-up    HPI   She also has a rash x 1 month.  Just on her abdomen she says not itchy or bothersome but has been there for about a month.  Its only on her abdomen she has not noted anywhere else.  Still feels very fatigued she says she is ultimately worried about the possibility of underlying cancer.  She is also been having some left hip pain to the point where it is waking her up in tears.  Nonalcoholic fatty liver disease-she was able to get in with the PA at Dr. Wilkie Aye office at the end of the month.     ROS    Objective:     BP 126/69   Pulse 63   Ht '5\' 5"'$  (1.651 m)   Wt 184 lb (83.5 kg)   SpO2 98%   BMI 30.62 kg/m    Physical Exam Vitals reviewed.  Constitutional:      Appearance: She is well-developed.  HENT:     Head: Normocephalic and atraumatic.  Eyes:     Conjunctiva/sclera: Conjunctivae normal.  Cardiovascular:     Rate and Rhythm: Normal rate.  Pulmonary:     Effort: Pulmonary effort is normal.  Skin:    General: Skin is dry.     Coloration: Skin is not pale.  Neurological:     Mental Status: She is alert and oriented to person, place, and time.  Psychiatric:        Behavior: Behavior normal.       No results found for any visits on 07/31/22.    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   Relevant Orders   Iron, TIBC and Ferritin Panel   Tissue Transglutaminase Abs,IgG,IgA   Protime-INR   Mitochondrial antibodies   Anti-smooth muscle antibody, IgG   ANA   IgA   CBC   Hepatic function panel   Other Visit Diagnoses     Rash    -  Primary   Relevant Orders   Fungal Stain   Iron, TIBC and Ferritin Panel   Tissue Transglutaminase Abs,IgG,IgA   Protime-INR   Mitochondrial antibodies   Anti-smooth muscle antibody, IgG   ANA   IgA   CBC   Hepatic function panel   Left hip pain       Relevant Orders   DG Hip Unilat W OR W/O Pelvis 2-3 Views Left (Completed)       Rash - skin scraping performed. Tinea versicolor vs pityriasis rosea.   Left hip pain - will get xray ot eval for level OA and she is personally worried about bone mets.    Fatigue - will check labs to rule out causes.  NAFLD - will get additional labs today. Has appt with GI coming up.    No follow-ups on file.    Beatrice Lecher, MD

## 2022-07-31 NOTE — Progress Notes (Signed)
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  No fracture etc. I would recommend referral to physical therapy.

## 2022-08-02 LAB — FUNGAL STAIN
FUNGAL SMEAR:: NONE SEEN
MICRO NUMBER:: 14025440
SPECIMEN QUALITY:: ADEQUATE

## 2022-08-02 MED ORDER — TRIAMCINOLONE ACETONIDE 0.1 % EX CREA: 1.0000 | TOPICAL_CREAM | Freq: Every day | CUTANEOUS | 0 refills | Status: DC | PRN

## 2022-08-02 NOTE — Addendum Note (Signed)
Addended by: Beatrice Lecher D on: 08/02/2022 04:40 PM   Modules accepted: Orders

## 2022-08-02 NOTE — Progress Notes (Signed)
Hi Manaal, the skin scraping was negative for any type of fungus.  We could try a topical steroid cream see if it helps.  I send that over to the pharmacy.  You can apply daily.  But if it is not improving after 2 weeks then please stop the medication. Orders Placed This Encounter     triamcinolone cream (KENALOG) 0.1 %         Sig: Apply 1 Application topically daily as needed.         Dispense:  30 g         Refill:  0

## 2022-08-03 DIAGNOSIS — M4326 Fusion of spine, lumbar region: Secondary | ICD-10-CM | POA: Diagnosis not present

## 2022-08-03 DIAGNOSIS — G894 Chronic pain syndrome: Secondary | ICD-10-CM | POA: Diagnosis not present

## 2022-08-05 LAB — IRON,TIBC AND FERRITIN PANEL
%SAT: 25 % (calc) (ref 16–45)
Ferritin: 55 ng/mL (ref 16–288)
Iron: 80 ug/dL (ref 45–160)
TIBC: 325 mcg/dL (calc) (ref 250–450)

## 2022-08-05 LAB — CBC
HCT: 39.8 % (ref 35.0–45.0)
Hemoglobin: 13.3 g/dL (ref 11.7–15.5)
MCH: 30.8 pg (ref 27.0–33.0)
MCHC: 33.4 g/dL (ref 32.0–36.0)
MCV: 92.1 fL (ref 80.0–100.0)
MPV: 12.3 fL (ref 7.5–12.5)
Platelets: 122 10*3/uL — ABNORMAL LOW (ref 140–400)
RBC: 4.32 10*6/uL (ref 3.80–5.10)
RDW: 11.9 % (ref 11.0–15.0)
WBC: 4.8 10*3/uL (ref 3.8–10.8)

## 2022-08-05 LAB — HEPATIC FUNCTION PANEL
AG Ratio: 1.4 (calc) (ref 1.0–2.5)
ALT: 34 U/L — ABNORMAL HIGH (ref 6–29)
AST: 40 U/L — ABNORMAL HIGH (ref 10–35)
Albumin: 4.2 g/dL (ref 3.6–5.1)
Alkaline phosphatase (APISO): 63 U/L (ref 37–153)
Bilirubin, Direct: 0.1 mg/dL (ref 0.0–0.2)
Globulin: 3 g/dL (calc) (ref 1.9–3.7)
Indirect Bilirubin: 0.2 mg/dL (calc) (ref 0.2–1.2)
Total Bilirubin: 0.3 mg/dL (ref 0.2–1.2)
Total Protein: 7.2 g/dL (ref 6.1–8.1)

## 2022-08-05 LAB — ANA: Anti Nuclear Antibody (ANA): NEGATIVE

## 2022-08-05 LAB — PROTIME-INR
INR: 1.1
Prothrombin Time: 11.3 s (ref 9.0–11.5)

## 2022-08-05 LAB — TISSUE TRANSGLUTAMINASE ABS,IGG,IGA
(tTG) Ab, IgA: <1 U/mL
(tTG) Ab, IgG: <1 U/mL

## 2022-08-05 LAB — IGA: Immunoglobulin A: 144 mg/dL (ref 70–320)

## 2022-08-05 LAB — MITOCHONDRIAL ANTIBODIES: Mitochondrial M2 Ab, IgG: <=20 U (ref <=20.0)

## 2022-08-05 LAB — ANTI-SMOOTH MUSCLE ANTIBODY, IGG: Actin (Smooth Muscle) Antibody (IGG): <20 U (ref <20)

## 2022-08-08 NOTE — Progress Notes (Signed)
Theresa Scott, all of your blood work is back in sorry it took a while some of them trickle in over the last week.  Your platelets are still a little bit low,  But stable.  Negative for celiac.  PT and INR normal.  No sign of autoimmune hepatitis or lupus.  Iron levels look pretty good.

## 2022-08-22 ENCOUNTER — Other Ambulatory Visit: Payer: Self-pay | Admitting: *Deleted

## 2022-08-22 ENCOUNTER — Encounter: Payer: Self-pay | Admitting: Physician Assistant

## 2022-08-22 ENCOUNTER — Ambulatory Visit (INDEPENDENT_AMBULATORY_CARE_PROVIDER_SITE_OTHER): Payer: Medicare Other | Admitting: Physician Assistant

## 2022-08-22 VITALS — BP 142/68 | HR 69 | Ht 65.0 in | Wt 183.0 lb

## 2022-08-22 DIAGNOSIS — R609 Edema, unspecified: Secondary | ICD-10-CM | POA: Diagnosis not present

## 2022-08-22 DIAGNOSIS — K7460 Unspecified cirrhosis of liver: Secondary | ICD-10-CM | POA: Diagnosis not present

## 2022-08-22 DIAGNOSIS — R0609 Other forms of dyspnea: Secondary | ICD-10-CM | POA: Diagnosis not present

## 2022-08-22 DIAGNOSIS — R0602 Shortness of breath: Secondary | ICD-10-CM

## 2022-08-22 DIAGNOSIS — M7989 Other specified soft tissue disorders: Secondary | ICD-10-CM

## 2022-08-22 NOTE — Progress Notes (Signed)
Chief Complaint: Non alcoholic cirrhosis  HPI:    Theresa Scott is a 66 year old female, known to Dr. Carlean Purl, with a past medical history as listed below including CKD, anxiety, GERD, osteoporosis and pancreatitis, who was presents to clinic today for complaint of nonalcoholic cirrhosis.    07/04/2016 EGD with status post Roux-en-Y gastrojejunostomy.  All healthy appearing.    12/20/2017 colonoscopy with 2 polyps removed from the sigmoid and ascending colon diverticulosis in the right colon.  Polyps were hyperplastic and repeat recommended 10 years.    07/30/2018 patient seen in clinic by Dr. Carlean Purl for her nonalcoholic fatty liver disease.  At that time she had done all she could not respect her liver fibrosis her fibrosis score was calculated to F2-F3.  Recommend she follow-up in a year to recheck LFTs about every 6 months.  At that point did not have cirrhosis.    07/12/2022 ultrasound elastography of the liver with greater than 13 KP A.  (In patients with nonalcoholic liver disease values suggest compensated advanced chronic liver disease).  Also coarse hepatic echotexture with nodular surface contour.    07/31/2022 AST 40, ALT 34.  CBC with platelets low at 122.  IgA normal, ANA, ASMA, AMA all normal, iron studies normal.  PT/INR normal.  1.1/11.3.    Today, the patient presents to clinic accompanied by her husband.  She explains that she has been feeling fatigued, finding that she gets shortness of breath when walking upstairs or very long distances.  Has also noticed some swelling in her legs to the level of her shins over the past few months.  Denies any abdominal distention.  Tells me that her weight has not fluctuated recently, she does not weigh every day but does weigh occasionally.  Patient is concerned given her family history of alcoholic cirrhosis and her brother that killed him at the age of 50 and fatty liver cirrhosis and her sister that killed her in her early 29s.    Patient does describe  a cardiac history of A-fib status post ablation but she has not followed with a cardiologist at least the past 3 years.    Denies fever, chills, weight loss, change in bowel habits, abdominal pain, blood in her stool or symptoms that awaken her from sleep.  Past Medical History:  Diagnosis Date   Allergy    Anxiety    Cataract 2019   Chronic kidney disease    Complication of anesthesia    hard to wake up and swelling after kidney stone removal in 1980 per pt   Degenerative disc disease    Depression    Diabetes mellitus    Fatty liver disease, nonalcoholic    Gallstones    GERD (gastroesophageal reflux disease)    Hypercholesterolemia    Hypertension    Kidney stones    Obesity    Osteoporosis    Pancreatitis     Past Surgical History:  Procedure Laterality Date   BACK SURGERY  1990,2003,2004,2010   BREAST SURGERY     CARDIAC CATHETERIZATION  2011   [facility].    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/22/2010   HAND SURGERY  2000   right   SPINE  SURGERY  1990,2001,2004,2005,2015   SUBTHALAMIC STIMULATOR BATTERY REPLACEMENT  08/16/2020   TONSILLECTOMY  1971    Current Outpatient Medications  Medication Sig Dispense Refill   Accu-Chek FastClix Lancets MISC Dx DM E11.9 Check fasting blood sugar every morning and after largest meal a few days a week. 306 each 4   Alcohol Swabs (B-D SINGLE USE SWABS REGULAR) PADS USE TOPICALLY  1  TO  2  TIMES  DAILY AS DIRECTED 200 each 4   alendronate (FOSAMAX) 70 MG tablet Take with a full glass of water on an empty stomach. 12 tablet 3   atorvastatin (LIPITOR) 20 MG tablet Take 1 tablet (20 mg total) by mouth daily. At bedtime 90 tablet 3   baclofen (LIORESAL)  10 MG tablet daily.     buPROPion (WELLBUTRIN XL) 150 MG 24 hr tablet Take 1 tablet (150 mg total) by mouth daily. 90 tablet 1   Cyanocobalamin (VITAMIN B-12 PO) Take 1 tablet by mouth every Wednesday.     desvenlafaxine (PRISTIQ) 50 MG 24 hr tablet Take 1 tablet (50 mg total) by mouth daily. 90 tablet 0   Docusate Sodium (COLACE PO)      gabapentin (NEURONTIN) 300 MG capsule TAKE 2 CAPSULES EVERY DAY 180 capsule 0   glucose blood (ACCU-CHEK SMARTVIEW) test strip Dx DM E11.9 Check fasting blood sugar every morning and after largest meal a few days a week. 200 strip 11   metFORMIN (GLUCOPHAGE-XR) 500 MG 24 hr tablet Take 1 tablet (500 mg total) by mouth daily with breakfast. 90 tablet 3   metoprolol succinate (TOPROL-XL) 25 MG 24 hr tablet Take 1 tablet (25 mg total) by mouth daily. 90 tablet 3   omeprazole (PRILOSEC) 40 MG capsule Take 1 capsule (40 mg total) by mouth daily. 90 capsule 3   ondansetron (ZOFRAN-ODT) 4 MG disintegrating tablet DISSOLVE 1 TABLET ON THE TONGUE EVERY 8 HOURS AS NEEDED FOR NAUSEA AND VOMITING 60 tablet 4   Oxycodone HCl 10 MG TABS Take 10 mg by mouth daily.     triamcinolone cream (KENALOG) 0.1 % Apply 1 Application topically daily as needed. 30 g 0   No current facility-administered medications for this visit.    Allergies as of 08/22/2022 - Review Complete 08/22/2022  Allergen Reaction Noted   Bee venom Anaphylaxis 08/09/2013   Loratadine Itching 08/14/2008   Nsaids Other (See Comments) 01/13/2013   Latex Rash 06/22/2012    Family History  Problem Relation Age of Onset   Lung cancer Mother    Alcohol abuse Mother    Cancer Mother    COPD Mother    Miscarriages / [place] Mother    Addison's disease Father    Cancer Sister    Diabetes Sister    COPD Sister    Cirrhosis Sister        secondary medication   Diabetes Sister    Colon cancer Brother        late 19's   Alcohol abuse Brother    Cancer Brother    Liver cancer Brother    Alcohol abuse  Brother    Cancer Brother    Kidney failure Brother        Dialysis at 39, born single kidney   Alcohol abuse Daughter    Esophageal cancer Neg Hx    Stomach cancer Neg Hx    Rectal cancer Neg Hx     Social History   Socioeconomic History   Marital status: Married    Spouse name:  [place]   Number of children: 4   Years of education: 9   Highest education level: GED or equivalent  Occupational History   Occupation: [employer]    Comment: retired  Tobacco Use   Smoking status: Former    Packs/day: 0.50    Years: 18.00    Total pack years: 9.00    Types: Cigarettes    Quit date: 08/04/1990    Years since quitting: 32.0   Smokeless tobacco: Never  Vaping Use   Vaping Use: Never used  Substance and Sexual Activity   Alcohol use: No   Drug use: No   Sexual activity: Yes    Birth control/protection: None  Other Topics Concern   Not on file  Social History Narrative   Married 1 daughter (34) and 3 adopted children (16, 3, 14 months)   Disabled, chronic back pain, 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   She enjoys crocheting       Social Determinants of Health   Financial Resource Strain: Low Risk  (04/03/2022)   Overall Financial Resource Strain (CARDIA)    Difficulty of Paying Living Expenses: Not hard at all  Food Insecurity: No Food Insecurity (04/03/2022)   Hunger Vital Sign    Worried About Running Out of Food in the Last Year: Never true    Ran Out of Food in the Last Year: Never true  Transportation Needs: No Transportation Needs (04/03/2022)   PRAPARE - Hydrologist (Medical): No    Lack of Transportation (Non-Medical): No  Physical Activity: Inactive (04/03/2022)   Exercise Vital Sign    Days of Exercise per Week: 0 days    Minutes of Exercise per Session: 0 min  Stress: No Stress Concern Present (04/03/2022)   [place]    Feeling of Stress : Not  at all  Social Connections: Moderately Integrated (04/03/2022)   Social Connection and Isolation Panel [NHANES]    Frequency of Communication with Friends and Family: More than three times a week    Frequency of Social Gatherings with Friends and Family: More than three times a week    Attends Religious Services: More than 4 times per year    Active Member of [employer] or Organizations: No    Attends Archivist Meetings: Never    Marital Status: Married  Human resources officer Violence: Not At Risk (04/03/2022)   Humiliation, Afraid, Rape, and Kick questionnaire    Fear of Current or Ex-Partner: No    Emotionally Abused: No    Physically Abused: No    Sexually Abused: No    Review of Systems:    Constitutional: No weight loss, fever or chills Skin: No rash  Cardiovascular: No chest pain Respiratory: No cough Gastrointestinal: See HPI and otherwise negative Genitourinary: No dysuria  Neurological: No headache, dizziness or syncope Musculoskeletal: No new muscle or joint pain Hematologic: No bleeding or bruising Psychiatric: No history of depression or anxiety   Physical Exam:  Vital signs: BP (!) 142/68   Pulse 69   Ht '5\' 5"'$  (1.651 m)   Wt 183 lb (83 kg)   BMI 30.45 kg/m    Constitutional:   Pleasant Elderly Caucasian female appears to be in NAD, Well developed, Well nourished, alert and cooperative Head:  Normocephalic and atraumatic. Eyes:   PEERL, EOMI. No icterus. Conjunctiva pink. Ears:  Normal auditory acuity. Neck:  Supple Throat: Oral cavity and pharynx without inflammation, swelling or  lesion.  Respiratory: Respirations even and unlabored. Lungs clear to auscultation bilaterally.   No wheezes, crackles, or rhonchi.  Cardiovascular: Normal S1, S2. No MRG. Regular rate and rhythm. 1+ b/l LE edema to the shins Gastrointestinal:  Soft, nondistended, nontender. No rebound or guarding. Normal bowel sounds. No appreciable masses or hepatomegaly. Rectal:  Not performed.   Msk:  Symmetrical without gross deformities. Without edema, no deformity or joint abnormality.  Neurologic:  Alert and  oriented x4;  grossly normal neurologically.  Skin:   Dry and intact without significant lesions or rashes. Psychiatric: Demonstrates good judgement and reason without abnormal affect or behaviors.  RELEVANT LABS AND IMAGING: CBC    Component Value Date/Time   WBC 4.8 07/31/2022 0924   RBC 4.32 07/31/2022 0924   HGB 13.3 07/31/2022 0924   HCT 39.8 07/31/2022 0924   PLT 122 (L) 07/31/2022 0924   MCV 92.1 07/31/2022 0924   MCH 30.8 07/31/2022 0924   MCHC 33.4 07/31/2022 0924   RDW 11.9 07/31/2022 0924   LYMPHSABS 1,364 07/03/2022 0000   MONOABS 0.6 12/20/2017 1000   EOSABS 99 07/03/2022 0000   BASOSABS 31 07/03/2022 0000    CMP     Component Value Date/Time   NA 142 07/03/2022 0000   K 4.6 07/03/2022 0000   CL 106 07/03/2022 0000   CO2 31 07/03/2022 0000   GLUCOSE 104 07/03/2022 0000   BUN 13 07/03/2022 0000   CREATININE 0.82 07/03/2022 0000   CALCIUM 9.7 07/03/2022 0000   PROT 7.2 07/31/2022 0924   ALBUMIN 3.6 11/07/2016 0910   AST 40 (H) 07/31/2022 0924   ALT 34 (H) 07/31/2022 0924   ALKPHOS 95 11/07/2016 0910   BILITOT 0.3 07/31/2022 0924   GFRNONAA 84 01/19/2021 1012   GFRAA 98 01/19/2021 1012    Assessment: 1.  Compensated nonalcoholic cirrhosis: Patient with long history of fatty liver disease, has now progressed to cirrhosis given recent ultrasound showing nodular contour of the liver and thrombocytopenia with minimal elevation in LFTs, PT/INR normal, patient with some bilateral lower extremity edema but also distant on exertion and fatigue; consider cirrhosis with edema versus cardiac etiology for swelling  Plan: 1.  Discussed with the patient I would like her to call her cardiologist and follow-up as it has been some years since she has seen them anyways.  I would like them to work-up lower extremity edema and shortness of breath/DOE.  Once  they are done with work-up we will be happy to see her back in clinic.  She is recommended to have an EGD and we discussed this today for variceal screening. 2.  In the meantime patient to maintain a low-sodium diet, less than 2 g a day and perform daily weights to keep an eye on her fluid status.  We discussed this in detail today. 3.  Patient was scheduled with Dr. Carlean Purl at his next available in January so that we do not lose track of her.  Hopefully she will see the cardiologist and had a work-up with them prior to that time.  Ellouise Newer, PA-C [facility] 08/22/2022, 8:42 AM  Cc: Hali Marry, *

## 2022-08-22 NOTE — Patient Instructions (Addendum)
If you are age 66 or older, your body mass index should be between 23-30. Your Body mass index is 30.45 kg/m. If this is out of the aforementioned range listed, please consider follow up with your Primary Care Provider.  If you are age 75 or younger, your body mass index should be between 19-25. Your Body mass index is 30.45 kg/m. If this is out of the aformentioned range listed, please consider follow up with your Primary Care Provider.   ________________________________________________________  Follow a low sodium diet.  Do daily weights.  Please follow up with Cardiology.  We have scheduled you for a follow up appointment with Dr. Carlean Purl on Monday, 10-30-22 at 8:30 am.  Thank you for choosing [facility] and for entrusting me with your care, Ellouise Newer, PA-C

## 2022-08-23 NOTE — Progress Notes (Unsigned)
Cardiology Office Note:    Date:  08/24/2022   ID:  Theresa Scott, DOB 12-30-1955, MRN [id number]  PCP:  Hali Marry, MD   [facility] Cardiologist:  Lenna Sciara, MD Referring MD: Hali Marry, *   Chief Complaint/Reason for Referral: Lower extremity edema  ASSESSMENT:    1. Peripheral edema   2. Type 2 diabetes mellitus without complication, without long-term current use of insulin ([place])   3. Hypertension associated with diabetes ([place])   4. Hyperlipidemia associated with type 2 diabetes mellitus ([place])   5. Cirrhosis, nonalcoholic ([place])   6. Daytime somnolence     PLAN:    In order of problems listed above: 1.  Peripheral edema: Given normal BNP recently and a history of compensated cirrhosis and I suspect that her lower extremity edema and fatigue has more to do with her liver pathology.  We will obtain an echocardiogram to evaluate further. 2.  Type 2 diabetes: Start aspirin 81 mg daily, continue atorvastatin, start losartan 12.5 mg bedtime for renal protection (check lytes next week), consider Jardiance in future.  Patient is on omeprazole which will protect her from GI issues given resumption of aspirin monotherapy. 3.  Hypertension: Start losartan for renal protection in diabetic. 4.  Hyperlipidemia: Continue statin goal LDL is less than 70.  We will check lipid panel, LFTs, LP(a) next week. 5.  Nonalcoholic cirrhosis: This is being followed by other providers. 6.  Somnolence: Will obtain a sleep study.              Dispo:  Return in about 6 months (around 02/22/2023).      Medication Adjustments/Labs and Tests Ordered: Current medicines are reviewed at length with the patient today.  Concerns regarding medicines are outlined above.  The following changes have been made:     Labs/tests ordered: Orders Placed This Encounter  Procedures   Comprehensive metabolic panel   Lipid panel   Lipoprotein A (LPA)   EKG 12-Lead    ECHOCARDIOGRAM COMPLETE   Split night study    Medication Changes: Meds ordered this encounter  Medications   losartan (COZAAR) 25 MG tablet    Sig: Take 0.5 tablets (12.5 mg total) by mouth at bedtime.    Dispense:  90 tablet    Refill:  3   aspirin EC 81 MG tablet    Sig: Take 1 tablet (81 mg total) by mouth daily. Swallow whole.    Dispense:  90 tablet    Refill:  3     Current medicines are reviewed at length with the patient today.  The patient does not have concerns regarding medicines.   History of Present Illness:    FOCUSED PROBLEM LIST:   1.  Elevated calcium score 2014 2.  Atrial fibrillation status post ablation 2001 3.  Compensated nonalcoholic cirrhosis followed by gastroenterology 4.  Type 2 diabetes not on insulin 5.  Hyperlipidemia 6.  Hypertension 7.  BMI of 30   The patient is a 66 y.o. female with the indicated medical history here for lower extremity edema.  The patient was seen by her primary care provider and gastroenterology recently.  She is thought to have nonalcoholic cirrhosis.  She is noted to have increased lower extremity edema.  A BNP was drawn 6 weeks ago which was within normal limits.  She is referred for further recommendations.  She tells me over the last few months she has been, more fatigue with activities of daily living.  She has had some mild shortness of breath but no exertional angina.  She feels worn out by the afternoon.  She does snore and reports daytime somnolence.  She was tested for sleep apnea sometime ago but this was negative.  She fortunately has not had any severe bleeding or bruising, signs or symptoms of stroke, or need for emergency room visits or hospitalization.         Current Medications: Current Meds  Medication Sig   Accu-Chek FastClix Lancets MISC Dx DM E11.9 Check fasting blood sugar every morning and after largest meal a few days a week.   Alcohol Swabs (B-D SINGLE USE SWABS REGULAR) PADS USE TOPICALLY  1   TO  2  TIMES  DAILY AS DIRECTED   alendronate (FOSAMAX) 70 MG tablet Take with a full glass of water on an empty stomach.   aspirin EC 81 MG tablet Take 1 tablet (81 mg total) by mouth daily. Swallow whole.   atorvastatin (LIPITOR) 20 MG tablet Take 1 tablet (20 mg total) by mouth daily. At bedtime   baclofen (LIORESAL) 10 MG tablet daily.   buPROPion (WELLBUTRIN XL) 150 MG 24 hr tablet Take 1 tablet (150 mg total) by mouth daily.   Cyanocobalamin (VITAMIN B-12 PO) Take 1 tablet by mouth every Wednesday.   desvenlafaxine (PRISTIQ) 50 MG 24 hr tablet Take 1 tablet (50 mg total) by mouth daily.   Docusate Sodium (COLACE PO)    gabapentin (NEURONTIN) 300 MG capsule TAKE 2 CAPSULES EVERY DAY   glucose blood (ACCU-CHEK SMARTVIEW) test strip Dx DM E11.9 Check fasting blood sugar every morning and after largest meal a few days a week.   losartan (COZAAR) 25 MG tablet Take 0.5 tablets (12.5 mg total) by mouth at bedtime.   metFORMIN (GLUCOPHAGE-XR) 500 MG 24 hr tablet Take 1 tablet (500 mg total) by mouth daily with breakfast.   metoprolol succinate (TOPROL-XL) 25 MG 24 hr tablet Take 1 tablet (25 mg total) by mouth daily.   omeprazole (PRILOSEC) 40 MG capsule Take 1 capsule (40 mg total) by mouth daily.   ondansetron (ZOFRAN-ODT) 4 MG disintegrating tablet DISSOLVE 1 TABLET ON THE TONGUE EVERY 8 HOURS AS NEEDED FOR NAUSEA AND VOMITING   Oxycodone HCl 10 MG TABS Take 10 mg by mouth daily.   triamcinolone cream (KENALOG) 0.1 % Apply 1 Application topically daily as needed.     Allergies:    Bee venom, Loratadine, Nsaids, and Latex   Social History:   Social History   Tobacco Use   Smoking status: Former    Packs/day: 0.50    Years: 18.00    Total pack years: 9.00    Types: Cigarettes    Quit date: 08/04/1990    Years since quitting: 32.0   Smokeless tobacco: Never  Vaping Use   Vaping Use: Never used  Substance Use Topics   Alcohol use: No   Drug use: No     Family Hx: Family  History  Problem Relation Age of Onset   Lung cancer Mother    Alcohol abuse Mother    Cancer Mother    COPD Mother    Miscarriages / [place] Mother    Addison's disease Father    Cancer Sister    Diabetes Sister    COPD Sister    Heart attack Sister    Lung cancer Sister    Cirrhosis Sister        secondary medication   Diabetes Sister    Colon  cancer Brother        late 18's   Alcohol abuse Brother    Cancer Brother    Liver cancer Brother    Alcohol abuse Brother    Cancer Brother    COPD Brother    Kidney failure Brother        Dialysis at 71, born single kidney   Alcohol abuse Daughter    Esophageal cancer Neg Hx    Stomach cancer Neg Hx    Rectal cancer Neg Hx      Review of Systems:   Please see the history of present illness.    All other systems reviewed and are negative.     EKGs/Labs/Other Test Reviewed:    EKG:  EKG performed September 2023 that I personally reviewed demonstrates sinus rhythm with poor R wave progression; EKG performed today that I personally reviewed demonstrates sinus rhythm.  Prior CV studies:  Calcium score CT 2014: 1.  No obstructive CAD noted. 2. Coronary artery calcium score of 35.5 Agatston units, placing the patient in the 83 percentile for her age and gender. This suggests high risk for future cardiac events.  Other studies Reviewed: Review of the additional studies/records demonstrates: CT abdomen pelvis 2017 demonstrates aortic atherosclerosis  Recent Labs: 12/19/2021: Magnesium 2.0; TSH 1.34 07/03/2022: Brain Natriuretic Peptide 79; BUN 13; Creat 0.82; Potassium 4.6; Sodium 142 07/31/2022: ALT 34; Hemoglobin 13.3; Platelets 122   Recent Lipid Panel Lab Results  Component Value Date/Time   CHOL 70 03/03/2021 10:06 AM   TRIG 58 03/03/2021 10:06 AM   HDL 27 (L) 03/03/2021 10:06 AM   LDLCALC 30 03/03/2021 10:06 AM    Risk Assessment/Calculations:                Physical Exam:    VS:  BP 112/72   Pulse  73   Ht '5\' 5"'$  (1.651 m)   Wt 180 lb 12.8 oz (82 kg)   SpO2 98%   BMI 30.09 kg/m    Wt Readings from Last 3 Encounters:  08/24/22 180 lb 12.8 oz (82 kg)  08/22/22 183 lb (83 kg)  07/31/22 184 lb (83.5 kg)    GENERAL:  No apparent distress, AOx3 HEENT:  No carotid bruits, +2 carotid impulses, no scleral icterus CAR: RRR no murmurs, gallops, rubs, or thrills RES:  Clear to auscultation bilaterally ABD:  Soft, obese, nontender, nondistended, positive bowel sounds x 4 VASC:  +2 radial pulses, +2 carotid pulses, palpable pedal pulses NEURO:  CN 2-12 grossly intact; motor and sensory grossly intact PSYCH:  No active depression or anxiety EXT:  Trace edema without ecchymosis, or cyanosis  Signed, Early Osmond, MD  08/24/2022 2:20 PM    [address] [place], [place], [place]  [address] Phone: [phone]; Fax: [phone]   Note:  This document was prepared using Dragon voice recognition software and may include unintentional dictation errors.

## 2022-08-24 ENCOUNTER — Ambulatory Visit: Payer: Medicare Other | Attending: Internal Medicine | Admitting: Internal Medicine

## 2022-08-24 ENCOUNTER — Encounter: Payer: Self-pay | Admitting: Internal Medicine

## 2022-08-24 VITALS — BP 112/72 | HR 73 | Ht 65.0 in | Wt 180.8 lb

## 2022-08-24 DIAGNOSIS — K7460 Unspecified cirrhosis of liver: Secondary | ICD-10-CM | POA: Insufficient documentation

## 2022-08-24 DIAGNOSIS — E1169 Type 2 diabetes mellitus with other specified complication: Secondary | ICD-10-CM | POA: Insufficient documentation

## 2022-08-24 DIAGNOSIS — I152 Hypertension secondary to endocrine disorders: Secondary | ICD-10-CM | POA: Diagnosis not present

## 2022-08-24 DIAGNOSIS — R609 Edema, unspecified: Secondary | ICD-10-CM | POA: Insufficient documentation

## 2022-08-24 DIAGNOSIS — E1159 Type 2 diabetes mellitus with other circulatory complications: Secondary | ICD-10-CM | POA: Insufficient documentation

## 2022-08-24 DIAGNOSIS — E119 Type 2 diabetes mellitus without complications: Secondary | ICD-10-CM | POA: Insufficient documentation

## 2022-08-24 DIAGNOSIS — E785 Hyperlipidemia, unspecified: Secondary | ICD-10-CM | POA: Diagnosis not present

## 2022-08-24 DIAGNOSIS — R400 Somnolence: Secondary | ICD-10-CM | POA: Insufficient documentation

## 2022-08-24 MED ORDER — ASPIRIN 81 MG PO TBEC: 81.0000 mg | DELAYED_RELEASE_TABLET | Freq: Every day | ORAL | 3 refills | Status: AC

## 2022-08-24 MED ORDER — LOSARTAN POTASSIUM 25 MG PO TABS: 12.5000 mg | ORAL_TABLET | Freq: Every day | ORAL | 3 refills | Status: DC

## 2022-08-24 NOTE — Patient Instructions (Signed)
Medication Instructions:  Your physician has recommended you make the following change in your medication:  1.) start losartan 25 mg tablet - take HALF TABLET daily at bedtime 2.) start aspirin 81 mg - take one tablet daily  *If you need a refill on your cardiac medications before your next appointment, please call your pharmacy*   Lab Work: Please return in one week: (cmet, lipids, Lp(a))  If you have labs (blood work) drawn today and your tests are completely normal, you will receive your results only by: [place] (if you have MyChart) OR A paper copy in the mail If you have any lab test that is abnormal or we need to change your treatment, we will call you to review the results.   Testing/Procedures: Your physician has requested that you have an echocardiogram. Echocardiography is a painless test that uses sound waves to create images of your heart. It provides your doctor with information about the size and shape of your heart and how well your heart's chambers and valves are working. This procedure takes approximately one hour. There are no restrictions for this procedure. Please do NOT wear cologne, perfume, aftershave, or lotions (deodorant is allowed). Please arrive 15 minutes prior to your appointment time.  Your physician has recommended that you have a sleep study. This test records several body functions during sleep, including: brain activity, eye movement, oxygen and carbon dioxide blood levels, heart rate and rhythm, breathing rate and rhythm, the flow of air through your mouth and nose, snoring, body muscle movements, and chest and belly movement.\    Follow-Up: At [facility], you and your health needs are our priority.  As part of our continuing mission to provide you with exceptional heart care, we have created designated Provider Care Teams.  These Care Teams include your primary Cardiologist (physician) and Advanced Practice Providers (APPs -   Physician Assistants and Nurse Practitioners) who all work together to provide you with the care you need, when you need it.  Your next appointment:   6 month(s)  The format for your next appointment:   In Person  Provider:   Lenna Sciara, MD   Important Information About [place]

## 2022-08-28 ENCOUNTER — Other Ambulatory Visit: Payer: Self-pay | Admitting: Family Medicine

## 2022-08-28 DIAGNOSIS — Z1231 Encounter for screening mammogram for malignant neoplasm of breast: Secondary | ICD-10-CM

## 2022-08-30 ENCOUNTER — Ambulatory Visit: Payer: Medicare Other | Attending: Internal Medicine

## 2022-08-30 DIAGNOSIS — R609 Edema, unspecified: Secondary | ICD-10-CM

## 2022-08-30 DIAGNOSIS — K7460 Unspecified cirrhosis of liver: Secondary | ICD-10-CM

## 2022-08-30 DIAGNOSIS — I152 Hypertension secondary to endocrine disorders: Secondary | ICD-10-CM

## 2022-08-30 DIAGNOSIS — E1159 Type 2 diabetes mellitus with other circulatory complications: Secondary | ICD-10-CM

## 2022-08-30 DIAGNOSIS — R400 Somnolence: Secondary | ICD-10-CM

## 2022-08-30 DIAGNOSIS — E119 Type 2 diabetes mellitus without complications: Secondary | ICD-10-CM | POA: Diagnosis not present

## 2022-08-30 DIAGNOSIS — R600 Localized edema: Secondary | ICD-10-CM

## 2022-08-30 DIAGNOSIS — E1169 Type 2 diabetes mellitus with other specified complication: Secondary | ICD-10-CM

## 2022-08-30 DIAGNOSIS — E785 Hyperlipidemia, unspecified: Secondary | ICD-10-CM | POA: Diagnosis not present

## 2022-08-31 LAB — COMPREHENSIVE METABOLIC PANEL
ALT: 21 IU/L (ref 0–32)
AST: 32 IU/L (ref 0–40)
Albumin/Globulin Ratio: 1.2 (ref 1.2–2.2)
Albumin: 4 g/dL (ref 3.9–4.9)
Alkaline Phosphatase: 93 IU/L (ref 44–121)
BUN/Creatinine Ratio: 17 (ref 12–28)
BUN: 12 mg/dL (ref 8–27)
Bilirubin Total: 0.5 mg/dL (ref 0.0–1.2)
CO2: 29 mmol/L (ref 20–29)
Calcium: 9.3 mg/dL (ref 8.7–10.3)
Chloride: 103 mmol/L (ref 96–106)
Creatinine, Ser: 0.71 mg/dL (ref 0.57–1.00)
Globulin, Total: 3.3 g/dL (ref 1.5–4.5)
Glucose: 103 mg/dL — ABNORMAL HIGH (ref 70–99)
Potassium: 4.6 mmol/L (ref 3.5–5.2)
Sodium: 143 mmol/L (ref 134–144)
Total Protein: 7.3 g/dL (ref 6.0–8.5)
eGFR: 94 mL/min/{1.73_m2} (ref >59)

## 2022-08-31 LAB — LIPID PANEL
Chol/HDL Ratio: 2.1 ratio (ref 0.0–4.4)
Cholesterol, Total: 70 mg/dL — ABNORMAL LOW (ref 100–199)
HDL: 33 mg/dL — ABNORMAL LOW (ref >39)
LDL Chol Calc (NIH): 24 mg/dL (ref 0–99)
Triglycerides: 52 mg/dL (ref 0–149)
VLDL Cholesterol Cal: 13 mg/dL (ref 5–40)

## 2022-08-31 LAB — LIPOPROTEIN A (LPA): Lipoprotein (a): 19.6 nmol/L (ref <75.0)

## 2022-09-01 ENCOUNTER — Other Ambulatory Visit: Payer: PRIVATE HEALTH INSURANCE

## 2022-09-11 ENCOUNTER — Ambulatory Visit (HOSPITAL_COMMUNITY): Payer: Medicare Other | Attending: Internal Medicine

## 2022-09-11 DIAGNOSIS — E119 Type 2 diabetes mellitus without complications: Secondary | ICD-10-CM | POA: Diagnosis not present

## 2022-09-11 DIAGNOSIS — I10 Essential (primary) hypertension: Secondary | ICD-10-CM

## 2022-09-11 DIAGNOSIS — E1169 Type 2 diabetes mellitus with other specified complication: Secondary | ICD-10-CM | POA: Insufficient documentation

## 2022-09-11 DIAGNOSIS — K7460 Unspecified cirrhosis of liver: Secondary | ICD-10-CM | POA: Insufficient documentation

## 2022-09-11 DIAGNOSIS — R400 Somnolence: Secondary | ICD-10-CM | POA: Diagnosis not present

## 2022-09-11 DIAGNOSIS — E1159 Type 2 diabetes mellitus with other circulatory complications: Secondary | ICD-10-CM | POA: Diagnosis not present

## 2022-09-11 DIAGNOSIS — E785 Hyperlipidemia, unspecified: Secondary | ICD-10-CM | POA: Diagnosis not present

## 2022-09-11 DIAGNOSIS — R609 Edema, unspecified: Secondary | ICD-10-CM | POA: Insufficient documentation

## 2022-09-11 DIAGNOSIS — I152 Hypertension secondary to endocrine disorders: Secondary | ICD-10-CM | POA: Insufficient documentation

## 2022-09-11 LAB — ECHOCARDIOGRAM COMPLETE
Area-P 1/2: 2.96 cm2
P 1/2 time: 499 msec
S' Lateral: 2.6 cm

## 2022-09-18 DIAGNOSIS — M4326 Fusion of spine, lumbar region: Secondary | ICD-10-CM | POA: Diagnosis not present

## 2022-09-18 DIAGNOSIS — G894 Chronic pain syndrome: Secondary | ICD-10-CM | POA: Diagnosis not present

## 2022-09-27 ENCOUNTER — Ambulatory Visit (INDEPENDENT_AMBULATORY_CARE_PROVIDER_SITE_OTHER): Payer: Medicare Other

## 2022-09-27 DIAGNOSIS — Z1231 Encounter for screening mammogram for malignant neoplasm of breast: Secondary | ICD-10-CM | POA: Diagnosis not present

## 2022-09-28 ENCOUNTER — Other Ambulatory Visit: Payer: Self-pay | Admitting: Family Medicine

## 2022-09-28 DIAGNOSIS — R7301 Impaired fasting glucose: Secondary | ICD-10-CM

## 2022-09-28 NOTE — Progress Notes (Signed)
Please call patient. Normal mammogram.  Repeat in 1 year.  

## 2022-10-03 ENCOUNTER — Other Ambulatory Visit: Payer: Self-pay | Admitting: Family Medicine

## 2022-10-03 DIAGNOSIS — R7301 Impaired fasting glucose: Secondary | ICD-10-CM

## 2022-10-30 ENCOUNTER — Other Ambulatory Visit (INDEPENDENT_AMBULATORY_CARE_PROVIDER_SITE_OTHER): Payer: Medicare Other

## 2022-10-30 ENCOUNTER — Encounter: Payer: Self-pay | Admitting: Internal Medicine

## 2022-10-30 ENCOUNTER — Ambulatory Visit (INDEPENDENT_AMBULATORY_CARE_PROVIDER_SITE_OTHER): Payer: Medicare Other | Admitting: Internal Medicine

## 2022-10-30 VITALS — BP 142/72 | HR 62 | Ht 65.0 in | Wt 182.2 lb

## 2022-10-30 DIAGNOSIS — K7581 Nonalcoholic steatohepatitis (NASH): Secondary | ICD-10-CM | POA: Diagnosis not present

## 2022-10-30 DIAGNOSIS — R040 Epistaxis: Secondary | ICD-10-CM

## 2022-10-30 DIAGNOSIS — R400 Somnolence: Secondary | ICD-10-CM

## 2022-10-30 DIAGNOSIS — K7460 Unspecified cirrhosis of liver: Secondary | ICD-10-CM

## 2022-10-30 DIAGNOSIS — R5383 Other fatigue: Secondary | ICD-10-CM | POA: Diagnosis not present

## 2022-10-30 LAB — CBC WITH DIFFERENTIAL/PLATELET
Basophils Absolute: 0 10*3/uL (ref 0.0–0.1)
Basophils Relative: 0.5 % (ref 0.0–3.0)
Eosinophils Absolute: 0.1 10*3/uL (ref 0.0–0.7)
Eosinophils Relative: 1.5 % (ref 0.0–5.0)
HCT: 40.7 % (ref 36.0–46.0)
Hemoglobin: 14 g/dL (ref 12.0–15.0)
Lymphocytes Relative: 22 % (ref 12.0–46.0)
Lymphs Abs: 1.2 10*3/uL (ref 0.7–4.0)
MCHC: 34.3 g/dL (ref 30.0–36.0)
MCV: 90.2 fl (ref 78.0–100.0)
Monocytes Absolute: 0.7 10*3/uL (ref 0.1–1.0)
Monocytes Relative: 12.8 % — ABNORMAL HIGH (ref 3.0–12.0)
Neutro Abs: 3.4 10*3/uL (ref 1.4–7.7)
Neutrophils Relative %: 63.2 % (ref 43.0–77.0)
Platelets: 130 10*3/uL — ABNORMAL LOW (ref 150.0–400.0)
RBC: 4.52 Mil/uL (ref 3.87–5.11)
RDW: 13.3 % (ref 11.5–15.5)
WBC: 5.4 10*3/uL (ref 4.0–10.5)

## 2022-10-30 LAB — PROTIME-INR
INR: 1.1 ratio — ABNORMAL HIGH (ref 0.8–1.0)
Prothrombin Time: 11.8 s (ref 9.6–13.1)

## 2022-10-30 LAB — AMMONIA: Ammonia: 30 umol/L (ref 11–35)

## 2022-10-30 NOTE — Patient Instructions (Addendum)
Your provider has requested that you go to the basement level for lab work before leaving today. Press "B" on the elevator. The lab is located at the first door on the left as you exit the elevator.  Due to recent changes in healthcare laws, you may see the results of your imaging and laboratory studies on MyChart before your provider has had a chance to review them.  We understand that in some cases there may be results that are confusing or concerning to you. Not all laboratory results come back in the same time frame and the provider may be waiting for multiple results in order to interpret others.  Please give [place] 48 hours in order for your provider to thoroughly review all the results before contacting the office for clarification of your results.   You have been scheduled for an endoscopy. Please follow written instructions given to you at your visit today. If you use inhalers (even only as needed), please bring them with you on the day of your procedure.  We have placed a referral to [facility]. There phone # is [phone]. They will contact you about an appointment. They are located at : [address], [place] [address]. Appointment made for 11/08/2022 at 9:30AM, arrive at 9:15AM. Patient aware.   I appreciate the opportunity to care for you. Silvano Rusk, MD, [facility]

## 2022-10-30 NOTE — Progress Notes (Signed)
Theresa Scott 67 y.o. 1956/03/01 [id number]  Assessment & Plan:   Encounter Diagnoses  Name Primary?   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  Perhaps she has obstructive sleep apnea but she gives a history of having multiple negative sleep studies in the past and I think when she was heavier as well.  She is 59 and she and her husband in the same age range care for a 72 and 53-year-old.  She generally gets about 6 hours of sleep at night.  I would not be surprised if this is what is in large part causing her fatigue though liver disease may have some role.  Does not appear to have any significant CHF or heart disease problems.  She needs an EGD to screen for varices this is scheduled.  She is referred to ENT for evaluation of intermittent epistaxis.  She has adequate clotting so that is not the issue.  I did not get a history of significant rhinorrhea and dry nose sinus problems etc.  Unlikely to be a serious problem but it has persisted and bothered her so I have referred her.  Further plans pending EGD.  Most likely needs follow-up in about 6 months with another ultrasound to screen for hepatocellular carcinoma.  Subjective:   Chief Complaint: Cirrhosis follow-up  HPI 67 year old white woman with a history of NAFLD and now NASH cirrhosis, presents for follow-up after seeing Ellouise Newer, PA-C 08/22/2022.  GI history summary as below:   07/04/2016 EGD with status post Roux-en-Y gastrojejunostomy.  All healthy appearing.    12/20/2017 colonoscopy with 2 polyps removed from the sigmoid and ascending colon diverticulosis in the right colon.  Polyps were hyperplastic and  repeat recommended 10 years.    07/30/2018 patient seen in clinic by Dr. Carlean Purl for her nonalcoholic fatty liver disease.  At that time she had done all she could not respect her liver fibrosis her fibrosis score was calculated to F2-F3.  Recommend she follow-up in a year to recheck LFTs about every 6 months.  At that point did not have cirrhosis.    07/12/2022 ultrasound elastography of the liver with greater than 13 KP A.  (In patients with nonalcoholic liver disease values suggest compensated advanced chronic liver disease).  Also coarse hepatic echotexture with nodular surface contour.    07/31/2022 AST 40, ALT 34.  CBC with platelets low at 122.  IgA normal, ANA, ASMA, AMA all normal, iron studies normal.  PT/INR normal.  1.1/11.3.  She continues to suffer with fatigue and sleepiness.  She has been slight confused at times where she might not remember why she went there was stool for something but nothing major it sounds like.  Her husband is here participates in the history.  She says she goes to bed at about 130 or 2 AM and awakens about 630.  Then she gets her son off to school.  She and her husband have adopted a 60 and 67-year-old, son and daughter.  They also help a lot of other people apparently.  She may go back to sleep for several hours after that.  She  remains fatigued.  She saw cardiology at the recommendation of Ms. lemon and an echocardiogram did not show any significant pathology.  A sleep study was ordered.  She does not snore much.  She says she has had those in the past and they have been negative.  In general she thinks her weight has been relatively stable though there was a period of time where rose about 10 pounds recently but then has dropped back down, she was constipated and feels like relief of that led to lower weight.  She has been having some epistaxis off and on a few times a month.  Maybe several times a month.  Usually about 10 minutes self-limited one was 20 minutes.  She has  not sought medical help for this.  This has been going on for about 4 months.     Lab Results  Component Value Date   TSH 1.34 12/19/2021    Wt Readings from Last 3 Encounters:  10/30/22 182 lb 4 oz (82.7 kg)  08/24/22 180 lb 12.8 oz (82 kg)  08/22/22 183 lb (83 kg)     Allergies  Allergen Reactions   Bee Venom Anaphylaxis   Loratadine Itching    REACTION: dizziness   Nsaids Other (See Comments)    GI intolerance.     Latex Rash   Current Meds  Medication Sig   Accu-Chek FastClix Lancets MISC Dx DM E11.9 Check fasting blood sugar every morning and after largest meal a few days a week.   Alcohol Swabs (B-D SINGLE USE SWABS REGULAR) PADS USE TOPICALLY  1  TO  2  TIMES  DAILY AS DIRECTED   alendronate (FOSAMAX) 70 MG tablet Take with a full glass of water on an empty stomach.   aspirin EC 81 MG tablet Take 1 tablet (81 mg total) by mouth daily. Swallow whole.   atorvastatin (LIPITOR) 20 MG tablet Take 1 tablet (20 mg total) by mouth daily. At bedtime   baclofen (LIORESAL) 10 MG tablet daily.   buPROPion (WELLBUTRIN XL) 150 MG 24 hr tablet TAKE 1 TABLET EVERY DAY   Cyanocobalamin (VITAMIN B-12 PO) Take 1 tablet by mouth every Wednesday.   desvenlafaxine (PRISTIQ) 50 MG 24 hr tablet Take 1 tablet (50 mg total) by mouth daily.   Docusate Sodium (COLACE PO)    gabapentin (NEURONTIN) 300 MG capsule TAKE 2 CAPSULES EVERY DAY   glucose blood (ACCU-CHEK SMARTVIEW) test strip Dx DM E11.9 Check fasting blood sugar every morning and after largest meal a few days a week.   losartan (COZAAR) 25 MG tablet Take 0.5 tablets (12.5 mg total) by mouth at bedtime.   metFORMIN (GLUCOPHAGE-XR) 500 MG 24 hr tablet TAKE 1 TABLET EVERY DAY WITH BREAKFAST   metoprolol succinate (TOPROL-XL) 25 MG 24 hr tablet Take 1 tablet (25 mg total) by mouth daily.   omeprazole (PRILOSEC) 40 MG capsule Take 1 capsule (40 mg total) by mouth daily.   ondansetron (ZOFRAN-ODT) 4 MG disintegrating tablet DISSOLVE 1  TABLET ON THE TONGUE EVERY 8 HOURS AS NEEDED FOR NAUSEA AND VOMITING   Oxycodone HCl 10 MG TABS Take 10 mg by mouth daily.   triamcinolone cream (KENALOG) 0.1 % Apply 1 Application topically daily as needed.   Past Medical History:  Diagnosis Date   Allergy    Anxiety    Cataract 2019   Chronic kidney disease    Complication of anesthesia    hard to wake up and swelling after kidney stone removal in 1980  per pt   Degenerative disc disease    Depression    Diabetes mellitus    Fatty liver disease, nonalcoholic    Gallstones    GERD (gastroesophageal reflux disease)    Hypercholesterolemia    Hypertension    Kidney stones    Obesity    Osteoporosis    Pancreatitis    SOB (shortness of breath)    Swelling of both lower extremities    Past Surgical History:  Procedure Laterality Date   BACK SURGERY  1990,2003,2004,2010   BREAST SURGERY     CARDIAC CATHETERIZATION  2011   [facility].    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/22/2010   HAND SURGERY  2000   right   SPINE SURGERY  1990,2001,2004,2005,2015   SUBTHALAMIC STIMULATOR BATTERY REPLACEMENT  08/16/2020   TONSILLECTOMY  1971   Social History   Social History Narrative   Married 1 daughter (23) and 3 adopted children (7, 3, 14 months)   Disabled, chronic back pain, former [employer] her.   She enjoys crocheting       family history includes Addison's disease in her father; Alcohol abuse in her brother, brother, daughter, and mother; COPD in her brother, mother, and sister; Cancer in her brother, brother, mother, and sister; Cirrhosis in her sister; Colon cancer in her brother; Diabetes in her sister and sister; Heart  attack in her sister; Kidney failure in her brother; Liver cancer in her brother; Lung cancer in her mother and sister; Miscarriages / [place] in her mother.   Review of Systems See HPI  Objective:   Physical Exam BP (!) 142/72   Pulse 62   Ht '5\' 5"'$  (1.651 m)   Wt 182 lb 4 oz (82.7 kg)   BMI 30.33 kg/m  NAD Kyphosis Lungs CTA Nares/nose no lesions Cor S1S2 no rmg Abd soft, mildly obese, prominent vv in wall no HSM Ext no edema Alert and oriented x 3 and no asterixis   Data reviewed include those things reflected in the HPI I have also reviewed the cardiology note of 08/24/2022 and her echocardiogram 09/11/2022 demonstrating grade 1 diastolic dysfunction and some mild LVH.  Otherwise negative.

## 2022-11-03 DIAGNOSIS — Z79899 Other long term (current) drug therapy: Secondary | ICD-10-CM | POA: Diagnosis not present

## 2022-11-03 DIAGNOSIS — G894 Chronic pain syndrome: Secondary | ICD-10-CM | POA: Diagnosis not present

## 2022-11-03 DIAGNOSIS — M4326 Fusion of spine, lumbar region: Secondary | ICD-10-CM | POA: Diagnosis not present

## 2022-11-03 DIAGNOSIS — Z79891 Long term (current) use of opiate analgesic: Secondary | ICD-10-CM | POA: Diagnosis not present

## 2022-11-06 ENCOUNTER — Encounter: Payer: Self-pay | Admitting: Internal Medicine

## 2022-11-06 ENCOUNTER — Ambulatory Visit (AMBULATORY_SURGERY_CENTER): Payer: Medicare Other | Admitting: Internal Medicine

## 2022-11-06 VITALS — BP 157/93 | HR 75 | Temp 98.9°F | Resp 20 | Ht 65.0 in | Wt 182.0 lb

## 2022-11-06 DIAGNOSIS — K7460 Unspecified cirrhosis of liver: Secondary | ICD-10-CM | POA: Diagnosis not present

## 2022-11-06 DIAGNOSIS — K7581 Nonalcoholic steatohepatitis (NASH): Secondary | ICD-10-CM | POA: Diagnosis not present

## 2022-11-06 DIAGNOSIS — K317 Polyp of stomach and duodenum: Secondary | ICD-10-CM | POA: Diagnosis not present

## 2022-11-06 DIAGNOSIS — R5383 Other fatigue: Secondary | ICD-10-CM | POA: Diagnosis not present

## 2022-11-06 DIAGNOSIS — I8510 Secondary esophageal varices without bleeding: Secondary | ICD-10-CM

## 2022-11-06 DIAGNOSIS — E119 Type 2 diabetes mellitus without complications: Secondary | ICD-10-CM | POA: Diagnosis not present

## 2022-11-06 MED ORDER — SODIUM CHLORIDE 0.9 % IV SOLN: 500.0000 mL | Freq: Once | INTRAVENOUS | Status: DC

## 2022-11-06 NOTE — Patient Instructions (Addendum)
I saw very small enlarged blood vessels called varices in the esophagus. They are small and not at significant risk of bleeding. There was also a tiny stomach polyp - biopsied.  I think it would be a good idea to change your blood pressure medicine from metoprolol to carvedilol. Carvedilol will treat high blood pressure and reduce chances of liver getting worse so I will ask Dr. Madilyn Fireman to do this.  Once I see pathology results I will make further recommendations but I suspect you will need to see me routinely in about 6 months for a check-up and sooner as needed.  I appreciate the opportunity to care for you. Gatha Mayer, MD, [facility]   Recommendation: Patient has a contact number available for                            emergencies. The signs and symptoms of potential                            delayed complications were discussed with the                            patient. Return to normal activities tomorrow.                            Written discharge instructions were provided to the                            patient.                           - Resume previous diet.                           - Continue present medications.                           - Await pathology results.                           - I think she should change metoprolol to                            carvedilol for BP and carvedilol treats portal htn                            and may reduce progression of liver disease.                           Will ask PCP Dr. Madilyn Fireman.  YOU HAD AN ENDOSCOPIC PROCEDURE TODAY AT [facility]:   Refer to the procedure report that was given to you for any specific questions about what was found during the examination.  If the procedure report does not answer your questions, please call your gastroenterologist to clarify.  If you requested that your care partner not be given the details of your procedure findings, then the procedure report has been included in a  sealed envelope for you to review at your  convenience later.  YOU SHOULD EXPECT: Some feelings of bloating in the abdomen. Passage of more gas than usual.  Walking can help get rid of the air that was put into your GI tract during the procedure and reduce the bloating. If you had a lower endoscopy (such as a colonoscopy or flexible sigmoidoscopy) you may notice spotting of blood in your stool or on the toilet paper. If you underwent a bowel prep for your procedure, you may not have a normal bowel movement for a few days.  Please Note:  You might notice some irritation and congestion in your nose or some drainage.  This is from the oxygen used during your procedure.  There is no need for concern and it should clear up in a day or so.  SYMPTOMS TO REPORT IMMEDIATELY:  Following upper endoscopy (EGD)  Vomiting of blood or coffee ground material  New chest pain or pain under the shoulder blades  Painful or persistently difficult swallowing  New shortness of breath  Fever of 100F or higher  Black, tarry-looking stools  For urgent or emergent issues, a gastroenterologist can be reached at any hour by calling [phone]. Do not use MyChart messaging for urgent concerns.   DIET:  We do recommend a small meal at first, but then you may proceed to your regular diet.  Drink plenty of fluids but you should avoid alcoholic beverages for 24 hours.  ACTIVITY:  You should plan to take it easy for the rest of today and you should NOT DRIVE or use heavy machinery until tomorrow (because of the sedation medicines used during the test).    FOLLOW UP: Our staff will call the number listed on your records the next business day following your procedure.  We will call around 7:15- 8:00 am to check on you and address any questions or concerns that you may have regarding the information given to you following your procedure. If we do not reach you, we will leave a message.     If any biopsies were taken you  will be contacted by phone or by letter within the next 1-3 weeks.  Please call us at [phone] if you have not heard about the biopsies in 3 weeks.   SIGNATURES/CONFIDENTIALITY: You and/or your care partner have signed paperwork which will be entered into your electronic medical record.  These signatures attest to the fact that that the information above on your After Visit Summary has been reviewed and is understood.  Full responsibility of the confidentiality of this discharge information lies with you and/or your care-partner.

## 2022-11-06 NOTE — Op Note (Addendum)
[place] Patient Name: Theresa Scott Procedure Date: 11/06/2022 10:49 AM MRN: [id number] Endoscopist: Gatha Mayer , MD, [id number] Age: 67 Referring MD:  Date of Birth: [birth year] Gender: Female Account #: [id number] Procedure:                Upper GI endoscopy Indications:              Cirrhosis rule out esophageal varices Medicines:                Monitored Anesthesia Care Procedure:                Pre-Anesthesia Assessment:                           - Prior to the procedure, a History and Physical                            was performed, and patient medications and                            allergies were reviewed. The patient's tolerance of                            previous anesthesia was also reviewed. The risks                            and benefits of the procedure and the sedation                            options and risks were discussed with the patient.                            All questions were answered, and informed consent                            was obtained. Prior Anticoagulants: The patient has                            taken no anticoagulant or antiplatelet agents. ASA                            Grade Assessment: III - A patient with severe                            systemic disease. After reviewing the risks and                            benefits, the patient was deemed in satisfactory                            condition to undergo the procedure.                           After obtaining informed consent, the endoscope was  passed under direct vision. Throughout the                            procedure, the patient's blood pressure, pulse, and                            oxygen saturations were monitored continuously. The                            GIF [id number] #[id number] was introduced through the                            mouth, and advanced to the afferent and efferent                            jejunal loops.  The upper GI endoscopy was                            accomplished without difficulty. The patient                            tolerated the procedure well. 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                            They were 3 mm in largest diameter.                           A single 5 mm sessile polyp was found in the                            cardia. Biopsies were taken with a cold forceps for                            histology. Verification of patient identification                            for the specimen was done. Estimated blood loss was                            minimal.                           Evidence of a gastric bypass was found. A gastric                            pouch with a 6 cm length from the GE junction to                            the gastrojejunal anastomosis was found. The  gastrojejunal anastomosis was characterized by                            healthy appearing mucosa.                           The examined jejunum was normal.                           The cardia and gastric fundus were normal on                            retroflexion. Complications:            No immediate complications. Estimated Blood Loss:     Estimated blood loss was minimal. Impression:               - Grade I esophageal varices. Trace                           - A single gastric polyp. Biopsied.                           - Gastric bypass with a pouch 6 cm in length.                            Gastrojejunal anastomosis characterized by healthy                            appearing mucosa. Bilroth II anatomy                           - Normal examined jejunum. Recommendation:           - Patient has a contact number available for                            emergencies. The signs and symptoms of potential                            delayed complications were discussed with the                             patient. Return to normal activities tomorrow.                            Written discharge instructions were provided to the                            patient.                           - Resume previous diet.                           - Continue present medications.                           -  Await pathology results.                           - I think she should change metoprolol to                            carvedilol for BP and carvedilol treats portal htn                            and may reduce progression of liver disease.                           Will ask PCP Dr. Madilyn Fireman. Gatha Mayer, MD 11/06/2022 11:37:50 AM This report has been signed electronically.

## 2022-11-06 NOTE — Progress Notes (Signed)
1059 Robinul 0.1 mg IV given due large amount of secretions upon assessment.  MD made aware, vss

## 2022-11-06 NOTE — Progress Notes (Signed)
History and Physical Interval Note:  11/06/2022 11:03 AM  Theresa Scott  has presented today for endoscopic procedure(s), with the diagnosis of  Encounter Diagnoses  Name Primary?   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  The various methods of evaluation and treatment have been discussed with the patient and/or family. After consideration of risks, benefits and other options for treatment, the patient has consented to  the endoscopic procedure(s).   The patient's history has been reviewed, patient examined, no change in status, stable for endoscopic procedure(s).  I have reviewed the patient's chart and labs.  Questions were answered to the patient's satisfaction.     Gatha Mayer, MD, [facility]

## 2022-11-06 NOTE — Progress Notes (Signed)
Pt's states no medical or surgical changes since previsit or office visit. 

## 2022-11-06 NOTE — Progress Notes (Signed)
Report given to PACU, vss 

## 2022-11-07 ENCOUNTER — Telehealth: Payer: Self-pay | Admitting: *Deleted

## 2022-11-07 NOTE — Telephone Encounter (Signed)
  Follow up Call-     11/06/2022   10:35 AM  Call back number  Post procedure Call Back phone  # [phone]  Permission to leave phone message Yes     Patient questions:  Do you have a fever, pain , or abdominal swelling? No. Pain Score  0 *  Have you tolerated food without any problems? Yes.    Have you been able to return to your normal activities? Yes.    Do you have any questions about your discharge instructions: Diet   No. Medications  No. Follow up visit  No.  Do you have questions or concerns about your Care? Yes.    Actions: * If pain score is 4 or above: No action needed, pain <4.

## 2022-11-08 DIAGNOSIS — R040 Epistaxis: Secondary | ICD-10-CM | POA: Diagnosis not present

## 2022-11-10 ENCOUNTER — Telehealth: Payer: Self-pay | Admitting: Family Medicine

## 2022-11-10 DIAGNOSIS — K7400 Hepatic fibrosis, unspecified: Secondary | ICD-10-CM

## 2022-11-10 DIAGNOSIS — F322 Major depressive disorder, single episode, severe without psychotic features: Secondary | ICD-10-CM

## 2022-11-10 MED ORDER — DESVENLAFAXINE SUCCINATE ER 50 MG PO TB24: 50.0000 mg | ORAL_TABLET | Freq: Every day | ORAL | 0 refills | Status: DC

## 2022-11-10 MED ORDER — CARVEDILOL 6.25 MG PO TABS: 6.2500 mg | ORAL_TABLET | Freq: Two times a day (BID) | ORAL | 3 refills | Status: DC

## 2022-11-10 NOTE — Telephone Encounter (Signed)
Patient advised.

## 2022-11-10 NOTE — Telephone Encounter (Signed)
Please call pt and let her know I am changing her metoprolol to carvedilol as per Dr. Celesta Aver recommendatin for her liver and BP. Sent to [facility]   Meds ordered this encounter  Medications   carvedilol (COREG) 6.25 MG tablet    Sig: Take 1 tablet (6.25 mg total) by mouth 2 (two) times daily with a meal.    Dispense:  180 tablet    Refill:  3

## 2022-11-14 ENCOUNTER — Encounter: Payer: Self-pay | Admitting: Internal Medicine

## 2022-11-16 ENCOUNTER — Other Ambulatory Visit: Payer: Self-pay | Admitting: Family Medicine

## 2022-11-16 DIAGNOSIS — E785 Hyperlipidemia, unspecified: Secondary | ICD-10-CM

## 2022-11-16 DIAGNOSIS — M810 Age-related osteoporosis without current pathological fracture: Secondary | ICD-10-CM

## 2022-11-19 ENCOUNTER — Ambulatory Visit
Admission: EM | Admit: 2022-11-19 | Discharge: 2022-11-19 | Disposition: A | Discharge status: Home / Self Care | Payer: Medicare Other | Attending: Internal Medicine | Admitting: Internal Medicine

## 2022-11-19 DIAGNOSIS — N3001 Acute cystitis with hematuria: Secondary | ICD-10-CM | POA: Insufficient documentation

## 2022-11-19 DIAGNOSIS — Z9884 Bariatric surgery status: Secondary | ICD-10-CM | POA: Diagnosis not present

## 2022-11-19 DIAGNOSIS — R051 Acute cough: Secondary | ICD-10-CM | POA: Diagnosis not present

## 2022-11-19 DIAGNOSIS — N189 Chronic kidney disease, unspecified: Secondary | ICD-10-CM | POA: Diagnosis not present

## 2022-11-19 DIAGNOSIS — Z87891 Personal history of nicotine dependence: Secondary | ICD-10-CM | POA: Insufficient documentation

## 2022-11-19 DIAGNOSIS — E1122 Type 2 diabetes mellitus with diabetic chronic kidney disease: Secondary | ICD-10-CM | POA: Insufficient documentation

## 2022-11-19 DIAGNOSIS — I129 Hypertensive chronic kidney disease with stage 1 through stage 4 chronic kidney disease, or unspecified chronic kidney disease: Secondary | ICD-10-CM | POA: Diagnosis not present

## 2022-11-19 DIAGNOSIS — Z9049 Acquired absence of other specified parts of digestive tract: Secondary | ICD-10-CM | POA: Diagnosis not present

## 2022-11-19 DIAGNOSIS — R350 Frequency of micturition: Secondary | ICD-10-CM | POA: Insufficient documentation

## 2022-11-19 DIAGNOSIS — J069 Acute upper respiratory infection, unspecified: Secondary | ICD-10-CM | POA: Insufficient documentation

## 2022-11-19 DIAGNOSIS — Z1152 Encounter for screening for COVID-19: Secondary | ICD-10-CM | POA: Diagnosis not present

## 2022-11-19 DIAGNOSIS — Z7984 Long term (current) use of oral hypoglycemic drugs: Secondary | ICD-10-CM | POA: Diagnosis not present

## 2022-11-19 DIAGNOSIS — R300 Dysuria: Secondary | ICD-10-CM | POA: Insufficient documentation

## 2022-11-19 LAB — POCT URINALYSIS DIP (MANUAL ENTRY)
Bilirubin, UA: NEGATIVE
Glucose, UA: NEGATIVE mg/dL
Ketones, POC UA: NEGATIVE mg/dL
Nitrite, UA: POSITIVE — AB
Protein Ur, POC: 30 mg/dL — AB
Spec Grav, UA: >=1.03 — AB (ref 1.010–1.025)
Urobilinogen, UA: 1 E.U./dL
pH, UA: 6 (ref 5.0–8.0)

## 2022-11-19 MED ORDER — CEPHALEXIN 500 MG PO CAPS: 500.0000 mg | ORAL_CAPSULE | Freq: Two times a day (BID) | ORAL | 0 refills | Status: AC

## 2022-11-19 MED ORDER — BENZONATATE 100 MG PO CAPS: 100.0000 mg | ORAL_CAPSULE | Freq: Three times a day (TID) | ORAL | 0 refills | Status: DC | PRN

## 2022-11-19 NOTE — Discharge Instructions (Signed)
You have a urinary tract infection which is being treated with an antibiotic.  Urine culture is pending.  Will call if it is abnormal.  It also appears that you have a viral illness.  I have prescribed a cough medication for you to take as needed.  COVID test is pending.  Will call if it is positive.  Please follow-up if any symptoms persist or worsen.

## 2022-11-19 NOTE — ED Triage Notes (Signed)
Pt reports c/o urinary frequency, dysuria and blood in urine. States she is congested.

## 2022-11-19 NOTE — ED Provider Notes (Signed)
[facility]    CSN: [id number] Arrival date & time: 11/19/22  0810      History   Chief Complaint Chief Complaint  Patient presents with   Urinary Tract Infection    HPI Theresa Scott is a 67 y.o. female.   Patient presents with 2 different chief complaints today.  Patient reports urinary frequency, dysuria, hematuria that started a few days ago.  Denies abdominal pain, back pain, fever, vaginal discharge, abnormal vaginal bleeding.  She also reports nasal congestion and productive cough that started about 3 days ago.  Denies any known sick contacts or fever.  Denies chest pain, shortness of breath, sore throat, ear pain, nausea, vomiting, diarrhea, abdominal pain.  Denies history of asthma or COPD.  Patient does not currently smoke cigarettes but reports history of smoking cigarettes approximately 30 years ago.   Urinary Tract Infection   Past Medical History:  Diagnosis Date   Allergy    Anxiety    Cataract 2019   Chronic kidney disease    Complication of anesthesia    hard to wake up and swelling after kidney stone removal in 1980 per pt   Degenerative disc disease    Depression    Diabetes mellitus    Fatty liver disease, nonalcoholic    Gallstones    GERD (gastroesophageal reflux disease)    Hypercholesterolemia    Hypertension    Kidney stones    Obesity    Osteoporosis    Pancreatitis    SOB (shortness of breath)    Swelling of both lower extremities     Patient Active Problem List   Diagnosis Date Noted   Fatigue 10/30/2022   Epistaxis 10/30/2022   Liver cirrhosis secondary to NASH ([place]) 10/30/2022   Major depressive disorder, single episode, severe without psychotic features ([place]) 11/03/2019   Osteoporosis 06/05/2019   NAFLD (nonalcoholic fatty liver disease) 12/04/2016   Liver fibrosis ([place]) - suspected 12/04/2016   Aortic calcification ([place]) 04/20/2016   AVNRT (AV nodal re-entry tachycardia) 08/18/2013   Daytime somnolence  06/26/2013   Sleep disorder, circadian 06/26/2013   History of gastric bypass, 08/22/2010. 11/03/2011   IMPAIRED FASTING GLUCOSE 12/15/2010   ANXIETY 01/31/2010   PSVT 12/01/2009   STASIS DERMATITIS 04/06/2009   ALLERGIC RHINITIS 08/19/2008   Hyperlipemia 06/18/2008   OBESITY 06/18/2008   Peripheral vascular disease, unspecified ([place]) 06/18/2008   GERD 06/18/2008   OSTEOARTHRITIS 06/18/2008   INCONTINENCE 06/18/2008   PROTEINURIA 06/18/2008   NEPHROLITHIASIS, HX OF 06/18/2008    Past Surgical History:  Procedure Laterality Date   BACK SURGERY  1990,2003,2004,2010   BREAST SURGERY     CARDIAC CATHETERIZATION  2011   [facility].    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/22/2010   HAND SURGERY  2000   right   SPINE SURGERY  1990,2001,2004,2005,2015   SUBTHALAMIC STIMULATOR BATTERY REPLACEMENT  08/16/2020   TONSILLECTOMY  1971    OB History   No obstetric history on file.      Home Medications    Prior to Admission medications   Medication Sig Start Date End Date Taking? Authorizing Provider  benzonatate (TESSALON) 100 MG capsule Take 1 capsule (100 mg total) by  mouth every 8 (eight) hours as needed for cough. 11/19/22  Yes , Hildred Alamin E, FNP  cephALEXin (KEFLEX) 500 MG capsule Take 1 capsule (500 mg total) by mouth 2 (two) times daily for 5 days. 11/19/22 11/24/22 Yes , Michele Rockers, FNP  Accu-Chek FastClix Lancets MISC Dx DM E11.9 Check fasting blood sugar every morning and after largest meal a few days a week. 06/13/22   Hali Marry, MD  Alcohol Swabs (B-D SINGLE USE SWABS REGULAR) PADS USE TOPICALLY  1  TO  2  TIMES  DAILY AS DIRECTED 11/18/19   Hali Marry, MD  alendronate  (FOSAMAX) 70 MG tablet TAKE 1 TABLET EVERY WEEK WITH A FULL GLASS OF WATER ON AN EMPTY STOMACH. 11/16/22   Hali Marry, MD  aspirin EC 81 MG tablet Take 1 tablet (81 mg total) by mouth daily. Swallow whole. 08/24/22   Early Osmond, MD  atorvastatin (LIPITOR) 20 MG tablet TAKE 1 TABLET EVERY DAY 11/16/22   Hali Marry, MD  baclofen (LIORESAL) 10 MG tablet daily. 10/05/15   [provider]  buPROPion (WELLBUTRIN XL) 150 MG 24 hr tablet TAKE 1 TABLET EVERY DAY 10/03/22   Hali Marry, MD  carvedilol (COREG) 6.25 MG tablet Take 1 tablet (6.25 mg total) by mouth 2 (two) times daily with a meal. 11/10/22   Hali Marry, MD  Cyanocobalamin (VITAMIN B-12 PO) Take 1 tablet by mouth every Wednesday.    [provider]  desvenlafaxine (PRISTIQ) 50 MG 24 hr tablet Take 1 tablet (50 mg total) by mouth daily. 11/10/22   Hali Marry, MD  Docusate Sodium (COLACE PO)  06/21/16   [provider]  gabapentin (NEURONTIN) 300 MG capsule TAKE 2 CAPSULES EVERY DAY 03/23/22   Hali Marry, MD  glucose blood (ACCU-CHEK SMARTVIEW) test strip Dx DM E11.9 Check fasting blood sugar every morning and after largest meal a few days a week. 06/20/22   Hali Marry, MD  losartan (COZAAR) 25 MG tablet Take 0.5 tablets (12.5 mg total) by mouth at bedtime. 08/24/22   Early Osmond, MD  metFORMIN (GLUCOPHAGE-XR) 500 MG 24 hr tablet TAKE 1 TABLET EVERY DAY WITH BREAKFAST 10/03/22   Hali Marry, MD  omeprazole (PRILOSEC) 40 MG capsule Take 1 capsule (40 mg total) by mouth daily. 12/19/21   Hali Marry, MD  ondansetron (ZOFRAN-ODT) 4 MG disintegrating tablet DISSOLVE 1 TABLET ON THE TONGUE EVERY 8 HOURS AS NEEDED FOR NAUSEA AND VOMITING 12/06/20   Hali Marry, MD  Oxycodone HCl 10 MG TABS Take 10 mg by mouth daily. 03/14/19   [provider]  triamcinolone cream (KENALOG) 0.1 % Apply 1 Application topically daily as  needed. 08/02/22   Hali Marry, MD    Family History Family History  Problem Relation Age of Onset   Lung cancer Mother    Alcohol abuse Mother    Cancer Mother    COPD Mother    Miscarriages / [place] Mother    Addison's disease Father    Cancer Sister    Diabetes Sister    COPD Sister    Heart attack Sister    Lung cancer Sister    Cirrhosis Sister        secondary medication   Diabetes Sister    Colon cancer Brother        late 59's   Alcohol abuse Brother    Cancer Brother    Liver cancer Brother  Alcohol abuse Brother    Cancer Brother    COPD Brother    Kidney failure Brother        Dialysis at 74, born single kidney   Alcohol abuse Daughter    Esophageal cancer Neg Hx    Stomach cancer Neg Hx    Rectal cancer Neg Hx     Social History Social History   Tobacco Use   Smoking status: Former    Packs/day: 0.50    Years: 18.00    Total pack years: 9.00    Types: Cigarettes    Quit date: 08/04/1990    Years since quitting: 32.3   Smokeless tobacco: Never  Vaping Use   Vaping Use: Never used  Substance Use Topics   Alcohol use: No   Drug use: No     Allergies   Bee venom, Loratadine, Nsaids, and Latex   Review of Systems Review of Systems Per HPI  Physical Exam Triage Vital Signs ED Triage Vitals [11/19/22 0820]  Enc Vitals Group     BP (!) 151/79     Pulse Rate 69     Resp 19     Temp 97.8 F (36.6 C)     Temp Source Oral     SpO2 97 %     Weight      Height      Head Circumference      Peak Flow      Pain Score 5     Pain Loc      Pain Edu?      Excl. in [place]?    No data found.  Updated Vital Signs BP (!) 151/79 (BP Location: Left Arm)   Pulse 69   Temp 97.8 F (36.6 C) (Oral)   Resp 19   SpO2 97%   Visual Acuity Right Eye Distance:   Left Eye Distance:   Bilateral Distance:    Right Eye Near:   Left Eye Near:    Bilateral Near:     Physical Exam Constitutional:      General: She is not in acute  distress.    Appearance: Normal appearance. She is not toxic-appearing or diaphoretic.  HENT:     Head: Normocephalic and atraumatic.     Right Ear: Tympanic membrane and ear canal normal.     Left Ear: Tympanic membrane and ear canal normal.     Nose: Congestion present.     Mouth/Throat:     Mouth: Mucous membranes are moist.     Pharynx: No posterior oropharyngeal erythema.  Eyes:     Extraocular Movements: Extraocular movements intact.     Conjunctiva/sclera: Conjunctivae normal.     Pupils: Pupils are equal, round, and reactive to light.  Cardiovascular:     Rate and Rhythm: Normal rate and regular rhythm.     Pulses: Normal pulses.     Heart sounds: Normal heart sounds.  Pulmonary:     Effort: Pulmonary effort is normal. No respiratory distress.     Breath sounds: Normal breath sounds. No stridor. No wheezing, rhonchi or rales.  Abdominal:     General: Abdomen is flat. Bowel sounds are normal. There is no distension.     Palpations: Abdomen is soft.     Tenderness: There is no abdominal tenderness. There is no right CVA tenderness or left CVA tenderness.  Musculoskeletal:        General: Normal range of motion.     Cervical back: Normal range of motion.  Skin:    General: Skin is warm and dry.  Neurological:     General: No focal deficit present.     Mental Status: She is alert and oriented to person, place, and time. Mental status is at baseline.  Psychiatric:        Mood and Affect: Mood normal.        Behavior: Behavior normal.      UC Treatments / Results  Labs (all labs ordered are listed, but only abnormal results are displayed) Labs Reviewed  POCT URINALYSIS DIP (MANUAL ENTRY) - Abnormal; Notable for the following components:      Result Value   Clarity, UA cloudy (*)    Spec Grav, UA >=1.030 (*)    Blood, UA moderate (*)    Protein Ur, POC =30 (*)    Nitrite, UA Positive (*)    Leukocytes, UA Large (3+) (*)    All other components within normal limits   SARS CORONAVIRUS 2 (TAT 6-24 HRS)  URINE CULTURE    EKG   Radiology No results found.  Procedures Procedures (including critical care time)  Medications Ordered in UC Medications - No data to display  Initial Impression / Assessment and Plan / UC Course  I have reviewed the triage vital signs and the nursing notes.  Pertinent labs & imaging results that were available during my care of the patient were reviewed by me and considered in my medical decision making (see chart for details).     Differential diagnosis for upper respiratory symptoms include allergic rhinitis versus viral upper respiratory infection.  Most suspicious of viral illness.  COVID test pending.  Awaiting result.  Advised supportive care and symptom management.  Patient sent cough medication.  There are no adventitious lung sounds on exam so do not think that chest imaging is necessary.  UA indicating urinary tract infection.  Will treat with cephalexin.  Creatinine clearance appears normal so no dosage adjustment necessary.  Urine culture pending.  Patient was advised to follow-up if any of her symptoms today persist or worsen.  Patient verbalized understanding and was agreeable with plan. Final Clinical Impressions(s) / UC Diagnoses   Final diagnoses:  Acute cystitis with hematuria  Dysuria  Urinary frequency  Acute upper respiratory infection  Acute cough     Discharge Instructions      You have a urinary tract infection which is being treated with an antibiotic.  Urine culture is pending.  Will call if it is abnormal.  It also appears that you have a viral illness.  I have prescribed a cough medication for you to take as needed.  COVID test is pending.  Will call if it is positive.  Please follow-up if any symptoms persist or worsen.    ED Prescriptions     Medication Sig Dispense Auth. Provider   benzonatate (TESSALON) 100 MG capsule Take 1 capsule (100 mg total) by mouth every 8 (eight) hours  as needed for cough. 21 capsule [place], [place] E, [place]   cephALEXin (KEFLEX) 500 MG capsule Take 1 capsule (500 mg total) by mouth 2 (two) times daily for 5 days. 10 capsule Teodora Medici, [place]      PDMP not reviewed this encounter.   Teodora Medici, [place] 11/19/22 [phone]

## 2022-11-20 LAB — SARS CORONAVIRUS 2 (TAT 6-24 HRS): SARS Coronavirus 2: NEGATIVE

## 2022-11-21 LAB — URINE CULTURE: Culture: >=100000 — AB

## 2022-12-15 DIAGNOSIS — G894 Chronic pain syndrome: Secondary | ICD-10-CM | POA: Diagnosis not present

## 2022-12-15 DIAGNOSIS — M4326 Fusion of spine, lumbar region: Secondary | ICD-10-CM | POA: Diagnosis not present

## 2022-12-18 ENCOUNTER — Ambulatory Visit (HOSPITAL_BASED_OUTPATIENT_CLINIC_OR_DEPARTMENT_OTHER): Payer: Medicare Other | Attending: Internal Medicine | Admitting: Cardiology

## 2022-12-18 VITALS — Ht 65.0 in | Wt 180.0 lb

## 2022-12-18 DIAGNOSIS — G4733 Obstructive sleep apnea (adult) (pediatric): Secondary | ICD-10-CM | POA: Diagnosis not present

## 2022-12-18 DIAGNOSIS — E785 Hyperlipidemia, unspecified: Secondary | ICD-10-CM | POA: Insufficient documentation

## 2022-12-18 DIAGNOSIS — E1159 Type 2 diabetes mellitus with other circulatory complications: Secondary | ICD-10-CM | POA: Diagnosis not present

## 2022-12-18 DIAGNOSIS — R600 Localized edema: Secondary | ICD-10-CM

## 2022-12-18 DIAGNOSIS — R400 Somnolence: Secondary | ICD-10-CM

## 2022-12-18 DIAGNOSIS — I152 Hypertension secondary to endocrine disorders: Secondary | ICD-10-CM | POA: Insufficient documentation

## 2022-12-18 DIAGNOSIS — K7460 Unspecified cirrhosis of liver: Secondary | ICD-10-CM

## 2022-12-18 DIAGNOSIS — E1169 Type 2 diabetes mellitus with other specified complication: Secondary | ICD-10-CM | POA: Diagnosis not present

## 2022-12-18 DIAGNOSIS — R609 Edema, unspecified: Secondary | ICD-10-CM

## 2022-12-18 DIAGNOSIS — E119 Type 2 diabetes mellitus without complications: Secondary | ICD-10-CM

## 2022-12-19 NOTE — Procedures (Signed)
   Patient Name: Theresa Scott, Theresa Scott Date: 12/18/2022 Gender: Female D.O.B: [birth year] Age (years): 66 Referring Provider: Lenna Sciara Height (inches): 62 Interpreting Physician: Fransico Him MD, ABSM Weight (lbs): 180 RPSGT: Zadie Rhine BMI: 30 MRN: [id number] Neck Size: 13.50  CLINICAL INFORMATION Sleep Study Type: NPSG  Indication for sleep study: Excessive Daytime Sleepiness, Fatigue, Snoring  Most recent polysomnogram dated 01/14/2017 revealed an AHI of 4.2/h and RDI of 8.8/h. SLEEP STUDY TECHNIQUE As per the AASM Manual for the Scoring of Sleep and Associated Events v2.3 (April 2016) with a hypopnea requiring 4% desaturations.  The channels recorded and monitored were frontal, central and occipital EEG, electrooculogram (EOG), submentalis EMG (chin), nasal and oral airflow, thoracic and abdominal wall motion, anterior tibialis EMG, snore microphone, electrocardiogram, and pulse oximetry.  MEDICATIONS Medications self-administered by patient taken the night of the study : BLCOFEN, GABAPENTIN, OXYCODONE HCL, STOOL SOFTENER, VITA IRON  SLEEP ARCHITECTURE The study was initiated at 10:46:54 PM and ended at 4:55:52 AM.  Sleep onset time was 1.5 minutes and the sleep efficiency was 97.4%. The total sleep time was 359.5 minutes.  Stage REM latency was 189.0 minutes.  The patient spent 1.4% of the night in stage N1 sleep, 75.8% in stage N2 sleep, 1.9% in stage N3 and 20.9% in REM.  Alpha intrusion was absent.  Supine sleep was 36.21%.  RESPIRATORY PARAMETERS The overall apnea/hypopnea index (AHI) was 9.5 per hour. There were 1 total apneas, including 1 obstructive, 0 central and 0 mixed apneas. There were 56 hypopneas and 19 RERAs.  The AHI during Stage REM sleep was 16.0 per hour.  AHI while supine was 26.3 per hour.  The mean oxygen saturation was 91.8%. The minimum SpO2 during sleep was 85.0%.  loud snoring was noted during this study.  CARDIAC DATA The 2 lead  EKG demonstrated sinus rhythm. The mean heart rate was 64.3 beats per minute. Other EKG findings include: None.  LEG MOVEMENT DATA The total PLMS were 0 with a resulting PLMS index of 0.0. Associated arousal with leg movement index was 0.0 .  IMPRESSIONS - Mild obstructive sleep apnea occurred during this study (AHI = 9.5/h) overall.  Moderate OSA during REM sleep (AHI 16/hr). - Mild oxygen desaturation was noted during this study (Min O2 = 85.0%). - The patient snored with loud snoring volume. - No cardiac abnormalities were noted during this study. - Clinically significant periodic limb movements did not occur during sleep. No significant associated arousals.  DIAGNOSIS - Obstructive Sleep Apnea (G47.33)  RECOMMENDATIONS - Therapeutic CPAP titration to determine optimal pressure required to alleviate sleep disordered breathing. - Positional therapy avoiding supine position during sleep. - Avoid alcohol, sedatives and other CNS depressants that may worsen sleep apnea and disrupt normal sleep architecture. - Sleep hygiene should be reviewed to assess factors that may improve sleep quality. - Weight management and regular exercise should be initiated or continued if appropriate.  [Electronically signed] 12/19/2022 08:41 AM  Fransico Him MD, ABSM Diplomate, American Board of Sleep Medicine

## 2022-12-27 ENCOUNTER — Other Ambulatory Visit (HOSPITAL_COMMUNITY): Payer: Self-pay | Admitting: Cardiology

## 2022-12-27 MED ORDER — LOSARTAN POTASSIUM 25 MG PO TABS: 12.5000 mg | ORAL_TABLET | Freq: Every day | ORAL | 2 refills | Status: DC

## 2023-01-02 ENCOUNTER — Telehealth: Payer: Self-pay | Admitting: *Deleted

## 2023-01-02 DIAGNOSIS — R400 Somnolence: Secondary | ICD-10-CM

## 2023-01-02 NOTE — Telephone Encounter (Signed)
-----   Message from Lauralee Evener, [place] sent at 12/20/2022  8:09 AM EST -----  ----- Message ----- From: Sueanne Margarita, MD Sent: 12/19/2022   8:42 AM EST To: Cv Div Sleep Studies  Please let patient know that they have sleep apnea.  Recommend therapeutic CPAP titration for treatment of patient's sleep disordered breathing.  If unable to perform an in lab titration then initiate ResMed auto CPAP from 4 to 15cm H2O with heated humidity and mask of choice and overnight pulse ox on CPAP.

## 2023-01-02 NOTE — Telephone Encounter (Signed)
The patient has been notified of the result and verbalized understanding.  All questions (if any) were answered. Theresa Scott, [place] 01/02/2023 6:21 PM    Patient wants to hold off on getting her cpap at this time. She will call us back if she has any changes.

## 2023-01-10 ENCOUNTER — Ambulatory Visit (INDEPENDENT_AMBULATORY_CARE_PROVIDER_SITE_OTHER): Payer: Medicare Other | Admitting: Physician Assistant

## 2023-01-10 ENCOUNTER — Encounter: Payer: Self-pay | Admitting: Physician Assistant

## 2023-01-10 ENCOUNTER — Ambulatory Visit (INDEPENDENT_AMBULATORY_CARE_PROVIDER_SITE_OTHER): Payer: Medicare Other

## 2023-01-10 VITALS — BP 142/74 | HR 74 | Temp 97.7°F | Ht 65.0 in | Wt 185.0 lb

## 2023-01-10 DIAGNOSIS — R5383 Other fatigue: Secondary | ICD-10-CM

## 2023-01-10 DIAGNOSIS — R109 Unspecified abdominal pain: Secondary | ICD-10-CM | POA: Diagnosis not present

## 2023-01-10 DIAGNOSIS — R5381 Other malaise: Secondary | ICD-10-CM

## 2023-01-10 DIAGNOSIS — K7460 Unspecified cirrhosis of liver: Secondary | ICD-10-CM

## 2023-01-10 DIAGNOSIS — R0789 Other chest pain: Secondary | ICD-10-CM

## 2023-01-10 DIAGNOSIS — K7581 Nonalcoholic steatohepatitis (NASH): Secondary | ICD-10-CM | POA: Diagnosis not present

## 2023-01-10 DIAGNOSIS — R0781 Pleurodynia: Secondary | ICD-10-CM | POA: Diagnosis not present

## 2023-01-10 LAB — POCT URINALYSIS DIP (CLINITEK)
Bilirubin, UA: NEGATIVE
Blood, UA: NEGATIVE
Glucose, UA: NEGATIVE mg/dL
Ketones, POC UA: NEGATIVE mg/dL
Leukocytes, UA: NEGATIVE
Nitrite, UA: NEGATIVE
POC PROTEIN,UA: NEGATIVE
Spec Grav, UA: 1.025 (ref 1.010–1.025)
Urobilinogen, UA: 0.2 E.U./dL
pH, UA: 5.5 (ref 5.0–8.0)

## 2023-01-10 NOTE — Progress Notes (Signed)
Both lungs appear clear. No displaced fractures seen.

## 2023-01-10 NOTE — Progress Notes (Signed)
Acute Office Visit  Subjective:     Patient ID: Theresa Scott, female    DOB: 10/26/1955, 67 y.o.   MRN: [id number]  Chief Complaint  Patient presents with   Flank Pain    HPI Patient is in today for right flank pain for a few days. She had UTI in late January and wants to make sure it is not coming back. Denies any urinary frequency, urgency or dysuria. She denies any fever, chills, cough, SOB. She is very tired and feels like this is getting progressively worse. She does have cirrhosis with liver disease. She had a door shut on her left side and since her left arm and chest wall has been sore for at least 3 weeks.   .. Active Ambulatory Problems    Diagnosis Date Noted   Hyperlipemia 06/18/2008   OBESITY 06/18/2008   ANXIETY 01/31/2010   PSVT 12/01/2009   Peripheral vascular disease, unspecified ([place]) 06/18/2008   STASIS DERMATITIS 04/06/2009   ALLERGIC RHINITIS 08/19/2008   GERD 06/18/2008   OSTEOARTHRITIS 06/18/2008   INCONTINENCE 06/18/2008   PROTEINURIA 06/18/2008   NEPHROLITHIASIS, HX OF 06/18/2008   IMPAIRED FASTING GLUCOSE 12/15/2010   History of gastric bypass, 08/22/2010. 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   Aortic calcification ([place]) 04/20/2016   NAFLD (nonalcoholic fatty liver disease) 12/04/2016   Liver fibrosis ([place]) - suspected 12/04/2016   Osteoporosis 06/05/2019   Major depressive disorder, single episode, severe without psychotic features ([place]) 11/03/2019   Fatigue 10/30/2022   Epistaxis 10/30/2022   Liver cirrhosis secondary to NASH ([place]) 10/30/2022   Resolved Ambulatory Problems    Diagnosis Date Noted   DIABETIC PERIPHERAL NEUROPATHY 07/20/2008   Major depression, chronic 06/18/2008   HYPERTENSION 06/18/2008   BRONCHITIS, CHRONIC 08/19/2008   REACTIVE AIRWAY DISEASE 08/19/2008   Backache 06/18/2008   INSOMNIA 06/18/2008   PERIPHERAL EDEMA  08/12/2008   Palpitations 11/25/2009   CHEST PAIN 01/31/2010   NONSPEC ELEVATION OF LEVELS OF TRANSAMINASE/LDH 07/20/2008   FOOT PAIN 12/15/2010   IFG (impaired fasting glucose) 06/20/2012   Chest pain 08/18/2013   Pancreatitis 03/30/2016   Postauricular adenopathy 08/06/2018   [place] lesion, left 08/06/2018   Cystitis 01/02/2020   Past Medical History:  Diagnosis Date   Allergy    Anxiety    Cataract 2019   Chronic kidney disease    Complication of anesthesia    Degenerative disc disease    Depression    Diabetes mellitus    Fatty liver disease, nonalcoholic    Gallstones    GERD (gastroesophageal reflux disease)    Hypercholesterolemia    Hypertension    Kidney stones    Obesity    SOB (shortness of breath)    Swelling of both lower extremities       ROS  See HPI.     Objective:    BP (!) 142/74   Pulse 74   Temp 97.7 F (36.5 C) (Oral)   Ht 5\' 5"  (1.651 m)   Wt 185 lb (83.9 kg)   SpO2 100%   BMI 30.79 kg/m  BP Readings from Last 3 Encounters:  01/10/23 (!) 142/74  11/19/22 (!) 151/79  11/06/22 (!) 157/93   Wt Readings from Last 3 Encounters:  01/10/23 185 lb (83.9 kg)  12/18/22 180 lb (81.6 kg)  11/06/22 182 lb (82.6 kg)      Physical Exam Constitutional:  Appearance: Normal appearance. She is obese.  HENT:     Head: Normocephalic.     Right Ear: Tympanic membrane, ear canal and external ear normal. There is no impacted cerumen.     Left Ear: Tympanic membrane, ear canal and external ear normal. There is no impacted cerumen.     Nose: Nose normal.     Mouth/Throat:     Mouth: Mucous membranes are moist.     Pharynx: No oropharyngeal exudate or posterior oropharyngeal erythema.  Eyes:     Extraocular Movements: Extraocular movements intact.     Conjunctiva/sclera: Conjunctivae normal.     Pupils: Pupils are equal, round, and reactive to light.  Cardiovascular:     Rate and Rhythm: Normal rate and regular rhythm.  Pulmonary:      Effort: Pulmonary effort is normal.     Breath sounds: Normal breath sounds.     Comments: Left chest wall tenderness to palpation.  Chest:     Chest wall: Tenderness present.  Abdominal:     General: Bowel sounds are normal. There is no distension.     Palpations: Abdomen is soft. There is no mass.     Tenderness: There is no abdominal tenderness. There is no right CVA tenderness, left CVA tenderness, guarding or rebound.     Hernia: No hernia is present.  Musculoskeletal:     Right lower leg: No edema.     Left lower leg: No edema.  Lymphadenopathy:     Cervical: No cervical adenopathy.  Neurological:     General: No focal deficit present.     Mental Status: She is alert and oriented to person, place, and time.  Psychiatric:        Mood and Affect: Mood normal.      Results for orders placed or performed in visit on 01/10/23  POCT URINALYSIS DIP ([facility])  Result Value Ref Range   Color, UA yellow yellow   Clarity, UA clear clear   Glucose, UA negative negative mg/dL   Bilirubin, UA negative negative   Ketones, POC UA negative negative mg/dL   Spec Grav, UA 1.025 1.010 - 1.025   Blood, UA negative negative   pH, UA 5.5 5.0 - 8.0   POC PROTEIN,UA negative negative, trace   Urobilinogen, UA 0.2 0.2 or 1.0 E.U./dL   Nitrite, UA Negative Negative   Leukocytes, UA Negative Negative        Assessment & Plan:  Marland KitchenMarland KitchenDarrin was seen today for flank pain.  Diagnoses and all orders for this visit:  Right flank pain -     POCT URINALYSIS DIP (CLINITEK) -     Urine Culture  Malaise and fatigue -     CBC w/Diff/Platelet -     COMPLETE METABOLIC PANEL WITH GFR -     Epstein-Barr virus VCA antibody panel -     CMV abs, IgG+IgM (cytomegalovirus) -     TSH  Rib pain on left side -     DG Ribs Unilateral W/Chest Left; Future  Left-sided chest wall pain -     DG Ribs Unilateral W/Chest Left; Future  Liver cirrhosis secondary to NASH ([facility])   UA negative for blood,  leukocytes, nitrites  Will culture but does not look like urinary infection When I look back patient has been complaining of fatigue for a while but she reports it is getting worse Her physical exam is consistent with MSK pain XRAYs showed no fractures and lungs are clear Labs ordered today  to evaluate electrolytes and acute causes of fatigue Due to left chest wall and rib pain will get xrays to look for fracture Ice areas Avoid tylenol ok to use volatren gel Follow up as needed if symptoms persist or worsen  Iran Planas, PA-C

## 2023-01-11 LAB — URINE CULTURE
MICRO NUMBER:: 14718015
SPECIMEN QUALITY:: ADEQUATE

## 2023-01-12 LAB — EPSTEIN-BARR VIRUS VCA ANTIBODY PANEL
EBV NA IgG: 62.1 U/mL — ABNORMAL HIGH
EBV VCA IgG: >750 U/mL — ABNORMAL HIGH
EBV VCA IgM: <36 U/mL

## 2023-01-12 LAB — COMPLETE METABOLIC PANEL WITH GFR
AG Ratio: 1.2 (calc) (ref 1.0–2.5)
ALT: 33 U/L — ABNORMAL HIGH (ref 6–29)
AST: 41 U/L — ABNORMAL HIGH (ref 10–35)
Albumin: 4.1 g/dL (ref 3.6–5.1)
Alkaline phosphatase (APISO): 93 U/L (ref 37–153)
BUN: 11 mg/dL (ref 7–25)
CO2: 29 mmol/L (ref 20–32)
Calcium: 9.2 mg/dL (ref 8.6–10.4)
Chloride: 107 mmol/L (ref 98–110)
Creat: 0.77 mg/dL (ref 0.50–1.05)
Globulin: 3.4 g/dL (calc) (ref 1.9–3.7)
Glucose, Bld: 157 mg/dL — ABNORMAL HIGH (ref 65–99)
Potassium: 4.4 mmol/L (ref 3.5–5.3)
Sodium: 144 mmol/L (ref 135–146)
Total Bilirubin: 0.5 mg/dL (ref 0.2–1.2)
Total Protein: 7.5 g/dL (ref 6.1–8.1)
eGFR: 85 mL/min/{1.73_m2} (ref >=60)

## 2023-01-12 LAB — CBC WITH DIFFERENTIAL/PLATELET
Absolute Monocytes: 466 cells/uL (ref 200–950)
Basophils Absolute: 32 cells/uL (ref 0–200)
Basophils Relative: 0.6 %
Eosinophils Absolute: 80 cells/uL (ref 15–500)
Eosinophils Relative: 1.5 %
HCT: 43.6 % (ref 35.0–45.0)
Hemoglobin: 14.1 g/dL (ref 11.7–15.5)
Lymphs Abs: 1182 cells/uL (ref 850–3900)
MCH: 29.9 pg (ref 27.0–33.0)
MCHC: 32.3 g/dL (ref 32.0–36.0)
MCV: 92.6 fL (ref 80.0–100.0)
MPV: 12.1 fL (ref 7.5–12.5)
Monocytes Relative: 8.8 %
Neutro Abs: 3540 cells/uL (ref 1500–7800)
Neutrophils Relative %: 66.8 %
Platelets: 141 10*3/uL (ref 140–400)
RBC: 4.71 10*6/uL (ref 3.80–5.10)
RDW: 11.9 % (ref 11.0–15.0)
Total Lymphocyte: 22.3 %
WBC: 5.3 10*3/uL (ref 3.8–10.8)

## 2023-01-12 LAB — TSH: TSH: 1.04 mIU/L (ref 0.40–4.50)

## 2023-01-12 LAB — CMV ABS, IGG+IGM (CYTOMEGALOVIRUS)
CMV IgM: <30 AU/mL
Cytomegalovirus Ab-IgG: 4.9 U/mL — ABNORMAL HIGH

## 2023-01-12 NOTE — Progress Notes (Signed)
Liver enzymes are trending back up some.  WBC to indicate infection is normal.  Thyroid is normal.  No significant bacteria detected in urine.  No recent evidence of EBV only past exposure.

## 2023-01-15 NOTE — Progress Notes (Signed)
When is PCP first available?

## 2023-01-15 NOTE — Progress Notes (Signed)
Past exposure to CMV but no recent antibodies.

## 2023-01-17 ENCOUNTER — Encounter: Payer: Self-pay | Admitting: Family Medicine

## 2023-01-17 ENCOUNTER — Ambulatory Visit (INDEPENDENT_AMBULATORY_CARE_PROVIDER_SITE_OTHER): Payer: Medicare Other | Admitting: Family Medicine

## 2023-01-17 VITALS — BP 145/98 | HR 83 | Wt 184.0 lb

## 2023-01-17 DIAGNOSIS — R0789 Other chest pain: Secondary | ICD-10-CM

## 2023-01-17 DIAGNOSIS — K7460 Unspecified cirrhosis of liver: Secondary | ICD-10-CM

## 2023-01-17 DIAGNOSIS — R5383 Other fatigue: Secondary | ICD-10-CM | POA: Diagnosis not present

## 2023-01-17 DIAGNOSIS — Z9884 Bariatric surgery status: Secondary | ICD-10-CM | POA: Diagnosis not present

## 2023-01-17 DIAGNOSIS — K7581 Nonalcoholic steatohepatitis (NASH): Secondary | ICD-10-CM | POA: Diagnosis not present

## 2023-01-17 DIAGNOSIS — R400 Somnolence: Secondary | ICD-10-CM

## 2023-01-17 NOTE — Progress Notes (Signed)
Acute Office Visit  Subjective:     Patient ID: Theresa Scott, female    DOB: 10-Sep-1956, 67 y.o.   MRN: [id number]  Chief Complaint  Patient presents with   chest heaviness   Fatigue    HPI Patient is in today for severe fatigue and continued left-sided chest pain.  He actually came in was seen about a week ago because she was having some persistent chest pain after she had been standing in between the door and the door frame and her nephew was pushing on the door and actually showed it on her left side on that left lateral breast onto her shoulder and chest area and it continued to hurt so she came in to make sure that everything was okay but she was also just feeling extremely tired.  She denies any palpitations.  No fevers or chills or weight loss.  The only medication that changed recently is they switched her Pristiq to a generic.  The last couple nights she moved it to bedtime instead of taking it in the morning and has felt a little bit better in regards to the excessive fatigue.  She has been falling asleep easily during the daytime.  She actually just had a sleep study done in the last year and her AHI was 9.  She does follow-up with her cardiologist in May.  Says when she gets really fatigued and tired she just feels out of it most like she is confused.  ROS      Objective:    BP (!) 145/98   Pulse 83   Wt 184 lb (83.5 kg)   SpO2 96%   BMI 30.62 kg/m    Physical Exam Vitals and nursing note reviewed.  Constitutional:      Appearance: She is well-developed.  HENT:     Head: Normocephalic and atraumatic.  Cardiovascular:     Rate and Rhythm: Normal rate and regular rhythm.     Heart sounds: Normal heart sounds.  Pulmonary:     Effort: Pulmonary effort is normal.     Breath sounds: Normal breath sounds.  Skin:    General: Skin is warm and dry.  Neurological:     Mental Status: She is alert and oriented to person, place, and time.  Psychiatric:         Behavior: Behavior normal.     No results found for any visits on 01/17/23.      Assessment & Plan:   Problem List Items Addressed This Visit       Digestive   Liver cirrhosis secondary to NASH [facility])   Relevant Orders   AFP tumor marker   Ammonia   B12 and Folate Panel   Fe+TIBC+Fer   Phosphorus   Magnesium   Vitamin B1   Vitamin B6   Urinalysis, Routine w reflex microscopic     Other   History of gastric bypass, 08/22/2010.   Relevant Orders   AFP tumor marker   Ammonia   B12 and Folate Panel   Fe+TIBC+Fer   Phosphorus   Magnesium   Vitamin B1   Vitamin B6   Urinalysis, Routine w reflex microscopic   Fatigue - Primary   Relevant Orders   AFP tumor marker   Ammonia   B12 and Folate Panel   Fe+TIBC+Fer   Phosphorus   Magnesium   Vitamin B1   Vitamin B6   Urinalysis, Routine w reflex microscopic   Other Visit Diagnoses  Atypical chest pain       Relevant Orders   AFP tumor marker   Ammonia   B12 and Folate Panel   Fe+TIBC+Fer   Phosphorus   Magnesium   Vitamin B1   Vitamin B6   Urinalysis, Routine w reflex microscopic      Atypical chest pain-EKG today shows rate of 66 bpm, normal sinus rhythm.  She does have a stimulator in her 9 that does affect the baseline just slightly.  But otherwise I do not see any significant changes compared to prior EKG from November 2023.  She had an echo in November and has a follow-up with cardiology in about 2 months.  Fatigue with excessive sleepiness-she has had a sleep study with an AHI of 9 though she is not currently on treatment.  They did not recommend CPAP at that time.  She recently had labs there was no anemia negative for CMV and EBV.  She does have history of liver fibrosis so I am going to check a ammonia level.  Also history of gastric bypass and will check for mineral deficiencies that could be contributing as well.  She does have a history of sleep disorder.  Consider neurology referral if not  improving.  No orders of the defined types were placed in this encounter.   No follow-ups on file.  Beatrice Lecher, MD

## 2023-01-17 NOTE — Patient Instructions (Signed)
Please try to check your blood pressure if you can when you feel really tired and exhausted.  It is 1 to make sure that it is normal and it is not too high or too low.

## 2023-01-21 LAB — URINALYSIS, ROUTINE W REFLEX MICROSCOPIC
Bacteria, UA: NONE SEEN /HPF
Bilirubin Urine: NEGATIVE
Glucose, UA: NEGATIVE
Hgb urine dipstick: NEGATIVE
Hyaline Cast: NONE SEEN /LPF
Ketones, ur: NEGATIVE
Nitrite: NEGATIVE
Protein, ur: NEGATIVE
RBC / HPF: NONE SEEN /HPF (ref 0–2)
Specific Gravity, Urine: 1.018 (ref 1.001–1.035)
pH: 6.5 (ref 5.0–8.0)

## 2023-01-21 LAB — B12 AND FOLATE PANEL
Folate: >24 ng/mL
Vitamin B-12: 1335 pg/mL — ABNORMAL HIGH (ref 200–1100)

## 2023-01-21 LAB — PHOSPHORUS: Phosphorus: 3.9 mg/dL (ref 2.1–4.3)

## 2023-01-21 LAB — MICROSCOPIC MESSAGE

## 2023-01-21 LAB — MAGNESIUM: Magnesium: 2 mg/dL (ref 1.5–2.5)

## 2023-01-21 LAB — IRON,TIBC AND FERRITIN PANEL
%SAT: 34 % (calc) (ref 16–45)
Ferritin: 80 ng/mL (ref 16–288)
Iron: 112 ug/dL (ref 45–160)
TIBC: 329 mcg/dL (calc) (ref 250–450)

## 2023-01-21 LAB — AFP TUMOR MARKER: AFP-Tumor Marker: 5.2 ng/mL

## 2023-01-21 LAB — VITAMIN B6: Vitamin B6: 20.1 ng/mL (ref 2.1–21.7)

## 2023-01-21 LAB — VITAMIN B1: Vitamin B1 (Thiamine): 21 nmol/L (ref 8–30)

## 2023-01-21 LAB — AMMONIA: Ammonia: 36 umol/L (ref <=72)

## 2023-01-22 NOTE — Progress Notes (Signed)
Hi Theresa Scott, B12 looks fantastic.  Folate is also fantastic.  Ammonia levels are normal.  Iron actually looks great.  Phosphorus and magnesium are normal.  B6 and thiamine levels are normal.  Fortunately nothing specific.  We could consider checking your oxygen overnight and make sure that that is not contributing to some of your exhaustion and fatigue.  Would you be open to doing an overnight pulse ox if so we can go ahead and place order and fax to home health agency.  I would also like to encourage you to get your shingles vaccine done at the local pharmacy.

## 2023-01-22 NOTE — Progress Notes (Signed)
Think it would be okay to hold off for now especially if moving the medicine to nighttime has really made a big difference.  We can always circle back to the oxygen test if needed but for now we will hold off it sounds like she has a lot going on.

## 2023-01-24 MED ORDER — AMBULATORY NON FORMULARY MEDICATION: 0 refills | Status: DC

## 2023-01-24 NOTE — Addendum Note (Signed)
Addended by: Beatrice Lecher D on: 01/24/2023 02:45 PM   Modules accepted: Orders

## 2023-01-24 NOTE — Progress Notes (Signed)
Order placed

## 2023-01-24 NOTE — Progress Notes (Signed)
Orders Placed This Encounter  Procedures   MICROSCOPIC MESSAGE   AFP tumor marker   Ammonia   B12 and Folate Panel   Fe+TIBC+Fer   Phosphorus   Magnesium   Vitamin B1   Vitamin B6   Urinalysis, Routine w reflex microscopic   EKG 12-Lead

## 2023-01-26 DIAGNOSIS — G894 Chronic pain syndrome: Secondary | ICD-10-CM | POA: Diagnosis not present

## 2023-01-26 DIAGNOSIS — M5106 Intervertebral disc disorders with myelopathy, lumbar region: Secondary | ICD-10-CM | POA: Diagnosis not present

## 2023-01-26 DIAGNOSIS — M4326 Fusion of spine, lumbar region: Secondary | ICD-10-CM | POA: Diagnosis not present

## 2023-02-03 ENCOUNTER — Other Ambulatory Visit: Payer: Self-pay | Admitting: Family Medicine

## 2023-02-03 DIAGNOSIS — F322 Major depressive disorder, single episode, severe without psychotic features: Secondary | ICD-10-CM

## 2023-02-12 ENCOUNTER — Other Ambulatory Visit: Payer: Self-pay | Admitting: Family Medicine

## 2023-02-12 DIAGNOSIS — I4719 Other supraventricular tachycardia: Secondary | ICD-10-CM

## 2023-02-23 NOTE — Progress Notes (Unsigned)
Cardiology Office Note:    Date:  02/26/2023   ID:  Theresa Scott, DOB December 09, 1955, MRN [id number]  PCP:  Agapito Games, MD   [facility] Providers Cardiologist:  Alverda Skeans, MD Referring MD: Agapito Games, *   Chief Complaint/Reason for Referral: Lower extremity edema  ASSESSMENT:    1. Nonrheumatic mitral valve regurgitation   2. Type 2 diabetes mellitus without complication, without long-term current use of insulin (HCC)   3. Hypertension associated with diabetes (HCC)   4. Hyperlipidemia associated with type 2 diabetes mellitus (HCC)   5. Cirrhosis, nonalcoholic (HCC)   6. Precordial pain   7. Fatigue, unspecified type   8. Daytime somnolence   9. Palpitations      PLAN:    In order of problems listed above: 1.  Mitral regurgitation: I reviewed the patient's echocardiogram.  It is unclear exactly how much mitral regurgitation she harbors.  I do not hear a prominent murmur.  I will refer her for a TEE to evaluate this further.  Depending on the results we may try a trial of diuretics. 2.  Type 2 diabetes: Continue aspirin, statin, and losartan.  Will start Jardiance 10 mg daily.   3.  Hypertension: Increase losartan to 25 mg and check creatinine next week. 4.  Hyperlipidemia: LDL was 24 last year.  Check lipid panel and LFTs next week with #3 labs. 5.  Nonalcoholic cirrhosis: This is being followed by other providers. 6.  Chest pain: We will obtain a coronary CTA to evaluate further.  If the patient has mild obstructive coronary artery disease, they will require a statin (with goal LDL < 70) and aspirin, if they have high-grade disease we will need to consider optimal medical therapy and if symptoms are refractory to medical therapy, then a cardiac catheterization with possible PCI will be pursued to alleviate symptoms.  If they have high risk disease we will proceed directly to cardiac catheterization.   7.  Fatigue: Will obtain 7-day monitor to assess  for recurrent atrial fibrillation.               Dispo:  Return in about 6 months (around 08/29/2023).      Medication Adjustments/Labs and Tests Ordered: Current medicines are reviewed at length with the patient today.  Concerns regarding medicines are outlined above.  The following changes have been made:     Labs/tests ordered: Orders Placed This Encounter  Procedures   CT CORONARY MORPH W/CTA COR W/SCORE W/CA W/CM &/OR WO/CM   Comprehensive metabolic panel   CBC   Lipid panel   LONG TERM MONITOR (3-14 DAYS)    Medication Changes: Meds ordered this encounter  Medications   losartan (COZAAR) 25 MG tablet    Sig: Take 1 tablet (25 mg total) by mouth daily.    Dispense:  45 tablet    Refill:  3   empagliflozin (JARDIANCE) 10 MG TABS tablet    Sig: Take 1 tablet (10 mg total) by mouth daily before breakfast.    Dispense:  90 tablet    Refill:  3   metoprolol tartrate (LOPRESSOR) 50 MG tablet    Sig: Take 1 tablet (50 mg total) by mouth once for 1 dose.    Dispense:  1 tablet    Refill:  0     Current medicines are reviewed at length with the patient today.  The patient does not have concerns regarding medicines.   History of Present Illness:  FOCUSED PROBLEM LIST:   1.  Elevated calcium score 2014 2.  Atrial fibrillation status post ablation 2001 3.  Compensated nonalcoholic cirrhosis followed by gastroenterology 4.  Type 2 diabetes not on insulin 5.  Hyperlipidemia; low Lp(a) 6.  Hypertension 7.  BMI of 21 September 2022: The patient is a 67 y.o. female with the indicated medical history here for lower extremity edema.  The patient was seen by her primary care provider and gastroenterology recently.  She is thought to have nonalcoholic cirrhosis.  She is noted to have increased lower extremity edema.  A BNP was drawn 6 weeks ago which was within normal limits.  She is referred for further recommendations.  She tells me over the last few months she has  been, more fatigue with activities of daily living.  She has had some mild shortness of breath but no exertional angina.  She feels worn out by the afternoon.  She does snore and reports daytime somnolence.  She was tested for sleep apnea sometime ago but this was negative.  She fortunately has not had any severe bleeding or bruising, signs or symptoms of stroke, or need for emergency room visits or hospitalization.  Plan: Obtain echocardiogram, start aspirin 81, losartan 12.5 mg, check lipid panel, LFTs, and LP(a), obtain sleep study, consider Jardiance in the future.  Today: The patient had a reassuring echocardiogram however the final read demonstrated moderate to severe mitral regurgitation.  I reviewed these echo images and is unclear exactly how much much regurgitation is present.  She is still quite fatigued almost all the time.  She does notice some shortness of breath with exertion but not routinely.  Her peripheral edema is somewhat better.  She really only notices it late in the day.  It seems to be better when she gets up in the morning.  She did have a sleep study done which showed only mild obstructive sleep apnea not enough to warrant CPAP or BiPAP.  Her PCP is considering a nocturnal oxygen study.  Interestingly she tells me she has developed some chest discomfort at times.  This can happen at rest and with exertion.  It does not happen on a routine basis.  She fortunately is not required any emergency room visits or hospitalizations.        Current Medications: Current Meds  Medication Sig   Accu-Chek FastClix Lancets MISC Dx DM E11.9 Check fasting blood sugar every morning and after largest meal a few days a week.   Alcohol Swabs (B-D SINGLE USE SWABS REGULAR) PADS USE TOPICALLY  1  TO  2  TIMES  DAILY AS DIRECTED   alendronate (FOSAMAX) 70 MG tablet TAKE 1 TABLET EVERY WEEK WITH A FULL GLASS OF WATER ON AN EMPTY STOMACH.   AMBULATORY NON FORMULARY MEDICATION Medication Name: Overnight  pulse ox, excess sedation, fatigue and daytime sleepiness.   aspirin EC 81 MG tablet Take 1 tablet (81 mg total) by mouth daily. Swallow whole.   atorvastatin (LIPITOR) 20 MG tablet TAKE 1 TABLET EVERY DAY   baclofen (LIORESAL) 10 MG tablet daily.   buPROPion (WELLBUTRIN XL) 150 MG 24 hr tablet TAKE 1 TABLET EVERY DAY   carvedilol (COREG) 6.25 MG tablet Take 1 tablet (6.25 mg total) by mouth 2 (two) times daily with a meal.   Cyanocobalamin (VITAMIN B-12 PO) Take 1 tablet by mouth every Wednesday.   desvenlafaxine (PRISTIQ) 50 MG 24 hr tablet TAKE 1 TABLET BY MOUTH EVERY DAY   Docusate  Sodium (COLACE PO)    empagliflozin (JARDIANCE) 10 MG TABS tablet Take 1 tablet (10 mg total) by mouth daily before breakfast.   gabapentin (NEURONTIN) 300 MG capsule TAKE 2 CAPSULES EVERY DAY   glucose blood (ACCU-CHEK SMARTVIEW) test strip Dx DM E11.9 Check fasting blood sugar every morning and after largest meal a few days a week.   losartan (COZAAR) 25 MG tablet Take 1 tablet (25 mg total) by mouth daily.   metFORMIN (GLUCOPHAGE-XR) 500 MG 24 hr tablet TAKE 1 TABLET EVERY DAY WITH BREAKFAST   metoprolol tartrate (LOPRESSOR) 50 MG tablet Take 1 tablet (50 mg total) by mouth once for 1 dose.   omeprazole (PRILOSEC) 40 MG capsule Take 1 capsule (40 mg total) by mouth daily.   ondansetron (ZOFRAN-ODT) 4 MG disintegrating tablet DISSOLVE 1 TABLET ON THE TONGUE EVERY 8 HOURS AS NEEDED FOR NAUSEA AND VOMITING   Oxycodone HCl 10 MG TABS Take 10 mg by mouth daily.   triamcinolone cream (KENALOG) 0.1 % Apply 1 Application topically daily as needed.   [DISCONTINUED] losartan (COZAAR) 25 MG tablet Take 0.5 tablets (12.5 mg total) by mouth at bedtime.     Allergies:    Bee venom, Loratadine, Nsaids, and Latex   Social History:   Social History   Tobacco Use   Smoking status: Former    Packs/day: 0.50    Years: 18.00    Additional pack years: 0.00    Total pack years: 9.00    Types: Cigarettes    Quit date:  08/04/1990    Years since quitting: 32.5   Smokeless tobacco: Never  Vaping Use   Vaping Use: Never used  Substance Use Topics   Alcohol use: No   Drug use: No     Family Hx: Family History  Problem Relation Age of Onset   Lung cancer Mother    Alcohol abuse Mother    Cancer Mother    COPD Mother    Miscarriages / [place] Mother    Addison's disease Father    Cancer Sister    Diabetes Sister    COPD Sister    Heart attack Sister    Lung cancer Sister    Cirrhosis Sister        secondary medication   Diabetes Sister    Colon cancer Brother        late 67's   Alcohol abuse Brother    Cancer Brother    Liver cancer Brother    Alcohol abuse Brother    Cancer Brother    COPD Brother    Kidney failure Brother        Dialysis at 61, born single kidney   Alcohol abuse Daughter    Esophageal cancer Neg Hx    Stomach cancer Neg Hx    Rectal cancer Neg Hx      Review of Systems:   Please see the history of present illness.    All other systems reviewed and are negative.     EKGs/Labs/Other Test Reviewed:    EKG:  EKG performed September 2023 that I personally reviewed demonstrates sinus rhythm with poor R wave progression; EKG performed today that I personally reviewed demonstrates sinus rhythm.  Prior CV studies:  TTE 2023: 1. Left ventricular ejection fraction, by estimation, is 60 to 65%. The  left ventricle has normal function. The left ventricle has no regional  wall motion abnormalities. There is mild concentric left ventricular  hypertrophy. Left ventricular diastolic  parameters are consistent with  Grade I diastolic dysfunction (impaired  relaxation).   2. Right ventricular systolic function is normal. The right ventricular  size is normal.   3. Right atrial size was mildly dilated.   4. The mitral valve is normal in structure. Moderate to severe mitral  valve regurgitation. No evidence of mitral stenosis.   5. The aortic valve is normal in  structure. Aortic valve regurgitation is  not visualized. No aortic stenosis is present.   6. The inferior vena cava is normal in size with greater than 50%  respiratory variability, suggesting right atrial pressure of 3 mmHg.   Calcium score CT 2014: 1.  No obstructive CAD noted. 2. Coronary artery calcium score of 35.5 Agatston units, placing the patient in the 83 percentile for her age and gender. This suggests high risk for future cardiac events.  Other studies Reviewed: Review of the additional studies/records demonstrates: CT abdomen pelvis 2017 demonstrates aortic atherosclerosis  Recent Labs: 07/03/2022: Brain Natriuretic Peptide 79 01/10/2023: ALT 33; BUN 11; Creat 0.77; Hemoglobin 14.1; Platelets 141; Potassium 4.4; Sodium 144; TSH 1.04 01/17/2023: Magnesium 2.0   Recent Lipid Panel Lab Results  Component Value Date/Time   CHOL 70 (L) 08/30/2022 09:17 AM   TRIG 52 08/30/2022 09:17 AM   HDL 33 (L) 08/30/2022 09:17 AM   LDLCALC 24 08/30/2022 09:17 AM   LDLCALC 30 03/03/2021 10:06 AM    Risk Assessment/Calculations:                Physical Exam:    VS:  BP 132/80   Pulse 65   Ht 5\' 5"  (1.651 m)   Wt 187 lb (84.8 kg)   SpO2 99%   BMI 31.12 kg/m    Wt Readings from Last 3 Encounters:  02/26/23 187 lb (84.8 kg)  01/17/23 184 lb (83.5 kg)  01/10/23 185 lb (83.9 kg)    GENERAL:  No apparent distress, AOx3 HEENT:  No carotid bruits, +2 carotid impulses, no scleral icterus CAR: RRR no murmurs, gallops, rubs, or thrills RES:  Clear to auscultation bilaterally ABD:  Soft, obese, nontender, nondistended, positive bowel sounds x 4 VASC:  +2 radial pulses, +2 carotid pulses, palpable pedal pulses NEURO:  CN 2-12 grossly intact; motor and sensory grossly intact PSYCH:  No active depression or anxiety EXT:  Trace edema without ecchymosis, or cyanosis  Signed, Orbie Pyo, MD  02/26/2023 9:33 AM    [facility] [address] [place],  [place], [place]  [address] Phone: [phone]; Fax: [phone]   Note:  This document was prepared using Dragon voice recognition software and may include unintentional dictation errors.

## 2023-02-23 NOTE — H&P (View-Only) (Signed)
Cardiology Office Note:    Date:  02/26/2023   ID:  ONNA NAHAR, DOB December 09, 1955, MRN [id number]  PCP:  Agapito Games, MD   [facility] Providers Cardiologist:  Alverda Skeans, MD Referring MD: Agapito Games, *   Chief Complaint/Reason for Referral: Lower extremity edema  ASSESSMENT:    1. Nonrheumatic mitral valve regurgitation   2. Type 2 diabetes mellitus without complication, without long-term current use of insulin (HCC)   3. Hypertension associated with diabetes (HCC)   4. Hyperlipidemia associated with type 2 diabetes mellitus (HCC)   5. Cirrhosis, nonalcoholic (HCC)   6. Precordial pain   7. Fatigue, unspecified type   8. Daytime somnolence   9. Palpitations      PLAN:    In order of problems listed above: 1.  Mitral regurgitation: I reviewed the patient's echocardiogram.  It is unclear exactly how much mitral regurgitation she harbors.  I do not hear a prominent murmur.  I will refer her for a TEE to evaluate this further.  Depending on the results we may try a trial of diuretics. 2.  Type 2 diabetes: Continue aspirin, statin, and losartan.  Will start Jardiance 10 mg daily.   3.  Hypertension: Increase losartan to 25 mg and check creatinine next week. 4.  Hyperlipidemia: LDL was 24 last year.  Check lipid panel and LFTs next week with #3 labs. 5.  Nonalcoholic cirrhosis: This is being followed by other providers. 6.  Chest pain: We will obtain a coronary CTA to evaluate further.  If the patient has mild obstructive coronary artery disease, they will require a statin (with goal LDL < 70) and aspirin, if they have high-grade disease we will need to consider optimal medical therapy and if symptoms are refractory to medical therapy, then a cardiac catheterization with possible PCI will be pursued to alleviate symptoms.  If they have high risk disease we will proceed directly to cardiac catheterization.   7.  Fatigue: Will obtain 7-day monitor to assess  for recurrent atrial fibrillation.               Dispo:  Return in about 6 months (around 08/29/2023).      Medication Adjustments/Labs and Tests Ordered: Current medicines are reviewed at length with the patient today.  Concerns regarding medicines are outlined above.  The following changes have been made:     Labs/tests ordered: Orders Placed This Encounter  Procedures   CT CORONARY MORPH W/CTA COR W/SCORE W/CA W/CM &/OR WO/CM   Comprehensive metabolic panel   CBC   Lipid panel   LONG TERM MONITOR (3-14 DAYS)    Medication Changes: Meds ordered this encounter  Medications   losartan (COZAAR) 25 MG tablet    Sig: Take 1 tablet (25 mg total) by mouth daily.    Dispense:  45 tablet    Refill:  3   empagliflozin (JARDIANCE) 10 MG TABS tablet    Sig: Take 1 tablet (10 mg total) by mouth daily before breakfast.    Dispense:  90 tablet    Refill:  3   metoprolol tartrate (LOPRESSOR) 50 MG tablet    Sig: Take 1 tablet (50 mg total) by mouth once for 1 dose.    Dispense:  1 tablet    Refill:  0     Current medicines are reviewed at length with the patient today.  The patient does not have concerns regarding medicines.   History of Present Illness:  FOCUSED PROBLEM LIST:   1.  Elevated calcium score 2014 2.  Atrial fibrillation status post ablation 2001 3.  Compensated nonalcoholic cirrhosis followed by gastroenterology 4.  Type 2 diabetes not on insulin 5.  Hyperlipidemia; low Lp(a) 6.  Hypertension 7.  BMI of 21 September 2022: The patient is a 67 y.o. female with the indicated medical history here for lower extremity edema.  The patient was seen by her primary care provider and gastroenterology recently.  She is thought to have nonalcoholic cirrhosis.  She is noted to have increased lower extremity edema.  A BNP was drawn 6 weeks ago which was within normal limits.  She is referred for further recommendations.  She tells me over the last few months she has  been, more fatigue with activities of daily living.  She has had some mild shortness of breath but no exertional angina.  She feels worn out by the afternoon.  She does snore and reports daytime somnolence.  She was tested for sleep apnea sometime ago but this was negative.  She fortunately has not had any severe bleeding or bruising, signs or symptoms of stroke, or need for emergency room visits or hospitalization.  Plan: Obtain echocardiogram, start aspirin 81, losartan 12.5 mg, check lipid panel, LFTs, and LP(a), obtain sleep study, consider Jardiance in the future.  Today: The patient had a reassuring echocardiogram however the final read demonstrated moderate to severe mitral regurgitation.  I reviewed these echo images and is unclear exactly how much much regurgitation is present.  She is still quite fatigued almost all the time.  She does notice some shortness of breath with exertion but not routinely.  Her peripheral edema is somewhat better.  She really only notices it late in the day.  It seems to be better when she gets up in the morning.  She did have a sleep study done which showed only mild obstructive sleep apnea not enough to warrant CPAP or BiPAP.  Her PCP is considering a nocturnal oxygen study.  Interestingly she tells me she has developed some chest discomfort at times.  This can happen at rest and with exertion.  It does not happen on a routine basis.  She fortunately is not required any emergency room visits or hospitalizations.        Current Medications: Current Meds  Medication Sig   Accu-Chek FastClix Lancets MISC Dx DM E11.9 Check fasting blood sugar every morning and after largest meal a few days a week.   Alcohol Swabs (B-D SINGLE USE SWABS REGULAR) PADS USE TOPICALLY  1  TO  2  TIMES  DAILY AS DIRECTED   alendronate (FOSAMAX) 70 MG tablet TAKE 1 TABLET EVERY WEEK WITH A FULL GLASS OF WATER ON AN EMPTY STOMACH.   AMBULATORY NON FORMULARY MEDICATION Medication Name: Overnight  pulse ox, excess sedation, fatigue and daytime sleepiness.   aspirin EC 81 MG tablet Take 1 tablet (81 mg total) by mouth daily. Swallow whole.   atorvastatin (LIPITOR) 20 MG tablet TAKE 1 TABLET EVERY DAY   baclofen (LIORESAL) 10 MG tablet daily.   buPROPion (WELLBUTRIN XL) 150 MG 24 hr tablet TAKE 1 TABLET EVERY DAY   carvedilol (COREG) 6.25 MG tablet Take 1 tablet (6.25 mg total) by mouth 2 (two) times daily with a meal.   Cyanocobalamin (VITAMIN B-12 PO) Take 1 tablet by mouth every Wednesday.   desvenlafaxine (PRISTIQ) 50 MG 24 hr tablet TAKE 1 TABLET BY MOUTH EVERY DAY   Docusate  Sodium (COLACE PO)    empagliflozin (JARDIANCE) 10 MG TABS tablet Take 1 tablet (10 mg total) by mouth daily before breakfast.   gabapentin (NEURONTIN) 300 MG capsule TAKE 2 CAPSULES EVERY DAY   glucose blood (ACCU-CHEK SMARTVIEW) test strip Dx DM E11.9 Check fasting blood sugar every morning and after largest meal a few days a week.   losartan (COZAAR) 25 MG tablet Take 1 tablet (25 mg total) by mouth daily.   metFORMIN (GLUCOPHAGE-XR) 500 MG 24 hr tablet TAKE 1 TABLET EVERY DAY WITH BREAKFAST   metoprolol tartrate (LOPRESSOR) 50 MG tablet Take 1 tablet (50 mg total) by mouth once for 1 dose.   omeprazole (PRILOSEC) 40 MG capsule Take 1 capsule (40 mg total) by mouth daily.   ondansetron (ZOFRAN-ODT) 4 MG disintegrating tablet DISSOLVE 1 TABLET ON THE TONGUE EVERY 8 HOURS AS NEEDED FOR NAUSEA AND VOMITING   Oxycodone HCl 10 MG TABS Take 10 mg by mouth daily.   triamcinolone cream (KENALOG) 0.1 % Apply 1 Application topically daily as needed.   [DISCONTINUED] losartan (COZAAR) 25 MG tablet Take 0.5 tablets (12.5 mg total) by mouth at bedtime.     Allergies:    Bee venom, Loratadine, Nsaids, and Latex   Social History:   Social History   Tobacco Use   Smoking status: Former    Packs/day: 0.50    Years: 18.00    Additional pack years: 0.00    Total pack years: 9.00    Types: Cigarettes    Quit date:  08/04/1990    Years since quitting: 32.5   Smokeless tobacco: Never  Vaping Use   Vaping Use: Never used  Substance Use Topics   Alcohol use: No   Drug use: No     Family Hx: Family History  Problem Relation Age of Onset   Lung cancer Mother    Alcohol abuse Mother    Cancer Mother    COPD Mother    Miscarriages / [place] Mother    Addison's disease Father    Cancer Sister    Diabetes Sister    COPD Sister    Heart attack Sister    Lung cancer Sister    Cirrhosis Sister        secondary medication   Diabetes Sister    Colon cancer Brother        late 67's   Alcohol abuse Brother    Cancer Brother    Liver cancer Brother    Alcohol abuse Brother    Cancer Brother    COPD Brother    Kidney failure Brother        Dialysis at 61, born single kidney   Alcohol abuse Daughter    Esophageal cancer Neg Hx    Stomach cancer Neg Hx    Rectal cancer Neg Hx      Review of Systems:   Please see the history of present illness.    All other systems reviewed and are negative.     EKGs/Labs/Other Test Reviewed:    EKG:  EKG performed September 2023 that I personally reviewed demonstrates sinus rhythm with poor R wave progression; EKG performed today that I personally reviewed demonstrates sinus rhythm.  Prior CV studies:  TTE 2023: 1. Left ventricular ejection fraction, by estimation, is 60 to 65%. The  left ventricle has normal function. The left ventricle has no regional  wall motion abnormalities. There is mild concentric left ventricular  hypertrophy. Left ventricular diastolic  parameters are consistent with  Grade I diastolic dysfunction (impaired  relaxation).   2. Right ventricular systolic function is normal. The right ventricular  size is normal.   3. Right atrial size was mildly dilated.   4. The mitral valve is normal in structure. Moderate to severe mitral  valve regurgitation. No evidence of mitral stenosis.   5. The aortic valve is normal in  structure. Aortic valve regurgitation is  not visualized. No aortic stenosis is present.   6. The inferior vena cava is normal in size with greater than 50%  respiratory variability, suggesting right atrial pressure of 3 mmHg.   Calcium score CT 2014: 1.  No obstructive CAD noted. 2. Coronary artery calcium score of 35.5 Agatston units, placing the patient in the 83 percentile for her age and gender. This suggests high risk for future cardiac events.  Other studies Reviewed: Review of the additional studies/records demonstrates: CT abdomen pelvis 2017 demonstrates aortic atherosclerosis  Recent Labs: 07/03/2022: Brain Natriuretic Peptide 79 01/10/2023: ALT 33; BUN 11; Creat 0.77; Hemoglobin 14.1; Platelets 141; Potassium 4.4; Sodium 144; TSH 1.04 01/17/2023: Magnesium 2.0   Recent Lipid Panel Lab Results  Component Value Date/Time   CHOL 70 (L) 08/30/2022 09:17 AM   TRIG 52 08/30/2022 09:17 AM   HDL 33 (L) 08/30/2022 09:17 AM   LDLCALC 24 08/30/2022 09:17 AM   LDLCALC 30 03/03/2021 10:06 AM    Risk Assessment/Calculations:                Physical Exam:    VS:  BP 132/80   Pulse 65   Ht 5\' 5"  (1.651 m)   Wt 187 lb (84.8 kg)   SpO2 99%   BMI 31.12 kg/m    Wt Readings from Last 3 Encounters:  02/26/23 187 lb (84.8 kg)  01/17/23 184 lb (83.5 kg)  01/10/23 185 lb (83.9 kg)    GENERAL:  No apparent distress, AOx3 HEENT:  No carotid bruits, +2 carotid impulses, no scleral icterus CAR: RRR no murmurs, gallops, rubs, or thrills RES:  Clear to auscultation bilaterally ABD:  Soft, obese, nontender, nondistended, positive bowel sounds x 4 VASC:  +2 radial pulses, +2 carotid pulses, palpable pedal pulses NEURO:  CN 2-12 grossly intact; motor and sensory grossly intact PSYCH:  No active depression or anxiety EXT:  Trace edema without ecchymosis, or cyanosis  Signed, Orbie Pyo, MD  02/26/2023 9:33 AM    [facility] [address] [place],  [place], [place]  [address] Phone: [phone]; Fax: [phone]   Note:  This document was prepared using Dragon voice recognition software and may include unintentional dictation errors.

## 2023-02-26 ENCOUNTER — Ambulatory Visit: Payer: Medicare Other | Attending: Internal Medicine | Admitting: Internal Medicine

## 2023-02-26 ENCOUNTER — Encounter: Payer: Self-pay | Admitting: Internal Medicine

## 2023-02-26 ENCOUNTER — Ambulatory Visit: Payer: Medicare Other | Attending: Internal Medicine

## 2023-02-26 VITALS — BP 132/80 | HR 65 | Ht 65.0 in | Wt 187.0 lb

## 2023-02-26 DIAGNOSIS — R400 Somnolence: Secondary | ICD-10-CM

## 2023-02-26 DIAGNOSIS — K7460 Unspecified cirrhosis of liver: Secondary | ICD-10-CM | POA: Insufficient documentation

## 2023-02-26 DIAGNOSIS — I152 Hypertension secondary to endocrine disorders: Secondary | ICD-10-CM | POA: Diagnosis not present

## 2023-02-26 DIAGNOSIS — I340 Nonrheumatic mitral (valve) insufficiency: Secondary | ICD-10-CM

## 2023-02-26 DIAGNOSIS — E1159 Type 2 diabetes mellitus with other circulatory complications: Secondary | ICD-10-CM | POA: Diagnosis not present

## 2023-02-26 DIAGNOSIS — E1169 Type 2 diabetes mellitus with other specified complication: Secondary | ICD-10-CM | POA: Diagnosis not present

## 2023-02-26 DIAGNOSIS — R002 Palpitations: Secondary | ICD-10-CM

## 2023-02-26 DIAGNOSIS — R072 Precordial pain: Secondary | ICD-10-CM

## 2023-02-26 DIAGNOSIS — E119 Type 2 diabetes mellitus without complications: Secondary | ICD-10-CM

## 2023-02-26 DIAGNOSIS — E785 Hyperlipidemia, unspecified: Secondary | ICD-10-CM | POA: Diagnosis not present

## 2023-02-26 DIAGNOSIS — R5383 Other fatigue: Secondary | ICD-10-CM | POA: Diagnosis not present

## 2023-02-26 MED ORDER — LOSARTAN POTASSIUM 25 MG PO TABS: 25.0000 mg | ORAL_TABLET | Freq: Every day | ORAL | 3 refills | Status: DC

## 2023-02-26 MED ORDER — METOPROLOL TARTRATE 50 MG PO TABS: 50.0000 mg | ORAL_TABLET | Freq: Once | ORAL | 0 refills | Status: DC

## 2023-02-26 MED ORDER — EMPAGLIFLOZIN 10 MG PO TABS: 10.0000 mg | ORAL_TABLET | Freq: Every day | ORAL | 3 refills | Status: DC

## 2023-02-26 NOTE — Patient Instructions (Addendum)
Medication Instructions:  Your physician has recommended you make the following change in your medication:   Start Jardiance 10 mg daily Increase Losartan to 25 mg daily  *If you need a refill on your cardiac medications before your next appointment, please call your pharmacy*   Lab Work: CMET, CBC, Lipids 1 week - Monday If you have labs (blood work) drawn today and your tests are completely normal, you will receive your results only by: MyChart Message (if you have MyChart) OR A paper copy in the mail If you have any lab test that is abnormal or we need to change your treatment, we will call you to review the results.   Testing/Procedures: Your physician has recommended that you wear an event monitor. Event monitors are medical devices that record the heart's electrical activity. Doctors most often [place] these monitors to diagnose arrhythmias. Arrhythmias are problems with the speed or rhythm of the heartbeat. The monitor is a small, portable device. You can wear one while you do your normal daily activities. This is usually used to diagnose what is causing palpitations/syncope (passing out).  Your physician has requested that you have a TEE. During a TEE, sound waves are used to create images of your heart. It provides your doctor with information about the size and shape of your heart and how well your heart's chambers and valves are working. In this test, a transducer is attached to the end of a flexible tube that is guided down you throat and into your esophagus (the tube leading from your mouth to your stomach) to get a more detailed image of your heart.  Please see the instruction sheet given to you today for more information.   Your physician has requested that you have cardiac CT. Cardiac computed tomography (CT) is a painless test that uses an x-ray machine to take clear, detailed pictures of your heart. For further information please visit [email or web address] Please follow instruction  sheet as given.    Follow-Up: At [facility], you and your health needs are our priority.  As part of our continuing mission to provide you with exceptional heart care, we have created designated Provider Care Teams.  These Care Teams include your primary Cardiologist (physician) and Advanced Practice Providers (APPs -  Physician Assistants and Nurse Practitioners) who all work together to provide you with the care you need, when you need it.   Your next appointment:   6 month(s)  Provider:   Orbie Pyo, MD     Other Instructions Christena Deem- Long Term Monitor Instructions  Your physician has requested you wear a ZIO patch monitor for 7 days.  This is a single patch monitor. [employer] supplies one patch monitor per enrollment. Additional stickers are not available. Please do not apply patch if you will be having a Nuclear Stress Test,  Echocardiogram, Cardiac CT, MRI, or Chest Xray during the period you would be wearing the  monitor. The patch cannot be worn during these tests. You cannot remove and re-apply the  ZIO XT patch monitor.  Your ZIO patch monitor will be mailed 3 day USPS to your address on file. It may take 3-5 days  to receive your monitor after you have been enrolled.  Once you have received your monitor, please review the enclosed instructions. Your monitor  has already been registered assigning a specific monitor serial # to you.  Billing and Patient Assistance Program Information  We have supplied [employer] with any of your insurance information on  file for billing purposes. [employer] offers a sliding scale Patient Assistance Program for patients that do not have  insurance, or whose insurance does not completely cover the cost of the ZIO monitor.  You must apply for the Patient Assistance Program to qualify for this discounted rate.  To apply, please call [employer] at [phone], select option 4, select option 2, ask to apply for  Patient Assistance Program.  Meredeth Ide will ask your household income, and how many people  are in your household. They will quote your out-of-pocket cost based on that information.  [employer] will also be able to set up a 36-month, interest-free payment plan if needed.  Applying the monitor   Shave hair from upper left chest.  Hold abrader disc by orange tab. Rub abrader in 40 strokes over the upper left chest as  indicated in your monitor instructions.  Clean area with 4 enclosed alcohol pads. Let dry.  Apply patch as indicated in monitor instructions. Patch will be placed under collarbone on left  side of chest with arrow pointing upward.  Rub patch adhesive wings for 2 minutes. Remove white label marked "1". Remove the white  label marked "2". Rub patch adhesive wings for 2 additional minutes.  While looking in a mirror, press and release button in center of patch. A small green light will  flash 3-4 times. This will be your only indicator that the monitor has been turned on.  Do not shower for the first 24 hours. You may shower after the first 24 hours.  Press the button if you feel a symptom. You will hear a small click. Record Date, Time and  Symptom in the Patient Logbook.  When you are ready to remove the patch, follow instructions on the last 2 pages of Patient  Logbook. Stick patch monitor onto the last page of Patient Logbook.  Place Patient Logbook in the blue and white box. Use locking tab on box and tape box closed  securely. The blue and white box has prepaid postage on it. Please place it in the mailbox as  soon as possible. Your physician should have your test results approximately 7 days after the  monitor has been mailed back to [facility].  Call [facility] Customer Care at [phone] if you have questions regarding  your ZIO XT patch monitor. Call them immediately if you see an orange light blinking on your  monitor.  If your monitor falls off in less than 4 days, contact our Monitor  department at [phone].  If your monitor becomes loose or falls off after 4 days call [employer] at [phone] for  suggestions on securing your monitor       Your cardiac CT will be scheduled at one of the below locations:   [facility] [address] [place], [place] [address] [phone]  OR  [facility] [address] [place], [place] [address] [phone]  OR   [facility] [address] [place], [place] [address] [phone]  If scheduled at [facility], please arrive at the [facility] and Children's Entrance (Entrance C2) of [facility] 30 minutes prior to test start time. You can use the FREE valet parking offered at entrance C (encouraged to control the heart rate for the test)  Proceed to the [facility] Radiology Department (first floor) to check-in and test prep.  All radiology patients and guests should use entrance C2 at [facility], accessed from  [address], even though the hospital's physical address listed is [address].     Please follow these instructions carefully (unless otherwise directed):   On the Night Before the Test: Be sure to Drink plenty of water. Do not consume any caffeinated/decaffeinated beverages or chocolate 12 hours prior to your test. Do not take any antihistamines 12 hours prior to your test.   On the Day of the Test: Drink plenty of water until 1 hour prior to the test. Do not eat any food 1 hour prior to test. You may take your regular medications prior to the test.  Take metoprolol (Lopressor) two hours prior to test. If you take Furosemide/Hydrochlorothiazide/Spironolactone, please HOLD on the morning of the test. FEMALES- please wear underwire-free bra if available, avoid dresses & tight clothing       After the Test: Drink plenty of water. After receiving IV contrast, you may  experience a mild flushed feeling. This is normal. On occasion, you may experience a mild rash up to 24 hours after the test. This is not dangerous. If this occurs, you can take Benadryl 25 mg and increase your fluid intake. If you experience trouble breathing, this can be serious. If it is severe call 911 IMMEDIATELY. If it is mild, please call our office. If you take any of these medications: Glipizide/Metformin, Avandament, Glucavance, please do not take 48 hours after completing test unless otherwise instructed.  We will call to schedule your test 2-4 weeks out understanding that some insurance companies will need an authorization prior to the service being performed.   For non-scheduling related questions, please contact the cardiac imaging nurse navigator should you have any questions/concerns: Rockwell Alexandria, Cardiac Imaging Nurse Navigator Larey Brick, Cardiac Imaging Nurse Navigator [facility] Direct Office Dial: [phone]   For scheduling needs, including cancellations and rescheduling, please call [place], [phone].        Dear [name]  You are scheduled for a TEE (Transesophageal Echocardiogram) on Wednesday, May 15 with Dr. Wyline Mood.  Please arrive at the [facility] (Main Entrance A[facility] at [facility]: [address] [place], [place] [address] at 8:00 AM (This time is 1 hour(s) before your procedure to ensure your preparation). Free valet parking service is available. You will check in at ADMITTING. The support person will be asked to wait in the waiting room.  It is OK to have someone drop you off and come back when you are ready to be discharged.     DIET:  Nothing to eat or drink after midnight except a sip of water with medications (see medication instructions below)  MEDICATION INSTRUCTIONS: !!IF ANY NEW MEDICATIONS ARE STARTED AFTER TODAY, PLEASE NOTIFY YOUR PROVIDER AS SOON AS POSSIBLE!!  FYI: Medications such as Semaglutide  (Ozempic, [place]), Tirzepatide (Mounjaro, Zepbound), Dulaglutide (Trulicity), etc ("GLP1 agonists") must be held around the time of a procedure. Talk to your provider if you take one of these.  LABS:  Come to the lab at [facility] at [address] between the hours of 8:00 am and 4:30 pm. You do NOT have to be fasting.  FYI:  For your safety, and to allow [place] to monitor your vital signs accurately during the surgery/procedure we request: If you have artificial nails, gel coating, SNS etc, please have those removed prior to your surgery/procedure. Not having the nail coverings /polish removed may result in cancellation or delay of your surgery/procedure.  You must  have a responsible person to drive you home and stay in the waiting area during your procedure. Failure to do so could result in cancellation.  Bring your insurance cards.  *Special Note: Every effort is made to have your procedure done on time. Occasionally there are emergencies that occur at the hospital that may cause delays. Please be patient if a delay does occur.

## 2023-02-26 NOTE — Progress Notes (Unsigned)
Enrolled for [employer] to mail a ZIO XT long term holter monitor to the patients address on file.  

## 2023-02-27 ENCOUNTER — Telehealth: Payer: Self-pay | Admitting: Family Medicine

## 2023-02-27 ENCOUNTER — Other Ambulatory Visit: Payer: Self-pay | Admitting: Orthopedic Surgery

## 2023-02-27 DIAGNOSIS — M5116 Intervertebral disc disorders with radiculopathy, lumbar region: Secondary | ICD-10-CM

## 2023-02-27 NOTE — Telephone Encounter (Signed)
Patient went to cardiologist he recommend that Dr. Linford Arnold order a oxygen test for the patient please advise

## 2023-02-27 NOTE — Telephone Encounter (Signed)
Overnight oxygen? Or sleep apnea test? I don't see anything in cards notes

## 2023-02-28 NOTE — Telephone Encounter (Signed)
Spoke w/pt she stated that when she was seen by Dr. Linford Arnold last it was recommended that she do overnight O2 she told the cardiologist about this and he said this would be a good idea but he could not order this it would need to be ordered by pcp.   This was ordered by Dr. Linford Arnold on 01/24/2023 will check on this. She stated that she does remember this and told that she wanted [place] to hold off on this at that time but would like to proceed now   Orders faxed to [facility] [phone]

## 2023-03-05 ENCOUNTER — Ambulatory Visit: Payer: Medicare Other | Attending: Internal Medicine

## 2023-03-05 DIAGNOSIS — R072 Precordial pain: Secondary | ICD-10-CM

## 2023-03-05 DIAGNOSIS — I152 Hypertension secondary to endocrine disorders: Secondary | ICD-10-CM

## 2023-03-05 DIAGNOSIS — R400 Somnolence: Secondary | ICD-10-CM

## 2023-03-05 DIAGNOSIS — R5383 Other fatigue: Secondary | ICD-10-CM

## 2023-03-05 DIAGNOSIS — E1159 Type 2 diabetes mellitus with other circulatory complications: Secondary | ICD-10-CM | POA: Diagnosis not present

## 2023-03-05 DIAGNOSIS — E1169 Type 2 diabetes mellitus with other specified complication: Secondary | ICD-10-CM | POA: Diagnosis not present

## 2023-03-05 DIAGNOSIS — E785 Hyperlipidemia, unspecified: Secondary | ICD-10-CM | POA: Diagnosis not present

## 2023-03-05 DIAGNOSIS — I340 Nonrheumatic mitral (valve) insufficiency: Secondary | ICD-10-CM | POA: Diagnosis not present

## 2023-03-05 DIAGNOSIS — R002 Palpitations: Secondary | ICD-10-CM

## 2023-03-05 DIAGNOSIS — E119 Type 2 diabetes mellitus without complications: Secondary | ICD-10-CM

## 2023-03-05 DIAGNOSIS — K7460 Unspecified cirrhosis of liver: Secondary | ICD-10-CM | POA: Diagnosis not present

## 2023-03-06 LAB — COMPREHENSIVE METABOLIC PANEL
ALT: 26 IU/L (ref 0–32)
AST: 34 IU/L (ref 0–40)
Albumin/Globulin Ratio: 1.4 (ref 1.2–2.2)
Albumin: 4.2 g/dL (ref 3.9–4.9)
Alkaline Phosphatase: 88 IU/L (ref 44–121)
BUN/Creatinine Ratio: 21 (ref 12–28)
BUN: 14 mg/dL (ref 8–27)
Bilirubin Total: 0.6 mg/dL (ref 0.0–1.2)
CO2: 25 mmol/L (ref 20–29)
Calcium: 9 mg/dL (ref 8.7–10.3)
Chloride: 104 mmol/L (ref 96–106)
Creatinine, Ser: 0.68 mg/dL (ref 0.57–1.00)
Globulin, Total: 3 g/dL (ref 1.5–4.5)
Glucose: 106 mg/dL — ABNORMAL HIGH (ref 70–99)
Potassium: 4.3 mmol/L (ref 3.5–5.2)
Sodium: 143 mmol/L (ref 134–144)
Total Protein: 7.2 g/dL (ref 6.0–8.5)
eGFR: 96 mL/min/{1.73_m2} (ref >59)

## 2023-03-06 LAB — LIPID PANEL
Chol/HDL Ratio: 2.3 ratio (ref 0.0–4.4)
Cholesterol, Total: 77 mg/dL — ABNORMAL LOW (ref 100–199)
HDL: 33 mg/dL — ABNORMAL LOW (ref >39)
LDL Chol Calc (NIH): 28 mg/dL (ref 0–99)
Triglycerides: 73 mg/dL (ref 0–149)
VLDL Cholesterol Cal: 16 mg/dL (ref 5–40)

## 2023-03-06 LAB — CBC
Hematocrit: 43.1 % (ref 34.0–46.6)
Hemoglobin: 14.1 g/dL (ref 11.1–15.9)
MCH: 30.4 pg (ref 26.6–33.0)
MCHC: 32.7 g/dL (ref 31.5–35.7)
MCV: 93 fL (ref 79–97)
Platelets: 146 10*3/uL — ABNORMAL LOW (ref 150–450)
RBC: 4.64 x10E6/uL (ref 3.77–5.28)
RDW: 12.2 % (ref 11.7–15.4)
WBC: 6.3 10*3/uL (ref 3.4–10.8)

## 2023-03-06 NOTE — Progress Notes (Signed)
Pt called and a voicemail was left instructing pt to be at the hospital at 0800 on 03/07/23.  Instructed to be NPO after midnight on 03/07/23.  Stated that pt needed to have a competent adult drive them home as well as stay with them for 24hours after the procedure.  Instructed to take meds with a small sip of water.

## 2023-03-07 ENCOUNTER — Ambulatory Visit (HOSPITAL_BASED_OUTPATIENT_CLINIC_OR_DEPARTMENT_OTHER): Payer: Medicare Other | Admitting: Certified Registered"

## 2023-03-07 ENCOUNTER — Encounter (HOSPITAL_COMMUNITY): Payer: Self-pay | Admitting: Internal Medicine

## 2023-03-07 ENCOUNTER — Other Ambulatory Visit: Payer: Self-pay

## 2023-03-07 ENCOUNTER — Ambulatory Visit (HOSPITAL_COMMUNITY)
Admission: RE | Admit: 2023-03-07 | Discharge: 2023-03-07 | Disposition: A | Discharge status: Home / Self Care | Payer: Medicare Other | Attending: Internal Medicine | Admitting: Internal Medicine

## 2023-03-07 ENCOUNTER — Ambulatory Visit (HOSPITAL_BASED_OUTPATIENT_CLINIC_OR_DEPARTMENT_OTHER): Payer: Medicare Other

## 2023-03-07 ENCOUNTER — Ambulatory Visit (HOSPITAL_COMMUNITY): Payer: Medicare Other | Admitting: Certified Registered"

## 2023-03-07 ENCOUNTER — Telehealth: Payer: Self-pay | Admitting: Internal Medicine

## 2023-03-07 ENCOUNTER — Encounter (HOSPITAL_COMMUNITY)
Admission: RE | Disposition: A | Discharge status: Home / Self Care | Payer: Self-pay | Source: Home / Self Care | Attending: Internal Medicine

## 2023-03-07 DIAGNOSIS — E1159 Type 2 diabetes mellitus with other circulatory complications: Secondary | ICD-10-CM | POA: Diagnosis not present

## 2023-03-07 DIAGNOSIS — Z7984 Long term (current) use of oral hypoglycemic drugs: Secondary | ICD-10-CM | POA: Diagnosis not present

## 2023-03-07 DIAGNOSIS — N189 Chronic kidney disease, unspecified: Secondary | ICD-10-CM | POA: Diagnosis not present

## 2023-03-07 DIAGNOSIS — R002 Palpitations: Secondary | ICD-10-CM | POA: Diagnosis not present

## 2023-03-07 DIAGNOSIS — I340 Nonrheumatic mitral (valve) insufficiency: Secondary | ICD-10-CM | POA: Insufficient documentation

## 2023-03-07 DIAGNOSIS — I4891 Unspecified atrial fibrillation: Secondary | ICD-10-CM | POA: Diagnosis not present

## 2023-03-07 DIAGNOSIS — E1122 Type 2 diabetes mellitus with diabetic chronic kidney disease: Secondary | ICD-10-CM

## 2023-03-07 DIAGNOSIS — Z7982 Long term (current) use of aspirin: Secondary | ICD-10-CM | POA: Insufficient documentation

## 2023-03-07 DIAGNOSIS — Z87891 Personal history of nicotine dependence: Secondary | ICD-10-CM | POA: Insufficient documentation

## 2023-03-07 DIAGNOSIS — R5383 Other fatigue: Secondary | ICD-10-CM | POA: Insufficient documentation

## 2023-03-07 DIAGNOSIS — R072 Precordial pain: Secondary | ICD-10-CM | POA: Diagnosis not present

## 2023-03-07 DIAGNOSIS — I129 Hypertensive chronic kidney disease with stage 1 through stage 4 chronic kidney disease, or unspecified chronic kidney disease: Secondary | ICD-10-CM | POA: Diagnosis not present

## 2023-03-07 DIAGNOSIS — I10 Essential (primary) hypertension: Secondary | ICD-10-CM | POA: Insufficient documentation

## 2023-03-07 DIAGNOSIS — E1151 Type 2 diabetes mellitus with diabetic peripheral angiopathy without gangrene: Secondary | ICD-10-CM | POA: Diagnosis not present

## 2023-03-07 DIAGNOSIS — K7460 Unspecified cirrhosis of liver: Secondary | ICD-10-CM | POA: Diagnosis not present

## 2023-03-07 DIAGNOSIS — E785 Hyperlipidemia, unspecified: Secondary | ICD-10-CM | POA: Insufficient documentation

## 2023-03-07 DIAGNOSIS — Z79899 Other long term (current) drug therapy: Secondary | ICD-10-CM | POA: Diagnosis not present

## 2023-03-07 DIAGNOSIS — G4733 Obstructive sleep apnea (adult) (pediatric): Secondary | ICD-10-CM | POA: Diagnosis not present

## 2023-03-07 HISTORY — PX: TEE WITHOUT CARDIOVERSION: SHX5443

## 2023-03-07 LAB — ECHO TEE

## 2023-03-07 LAB — GLUCOSE, CAPILLARY: Glucose-Capillary: 118 mg/dL — ABNORMAL HIGH (ref 70–99)

## 2023-03-07 SURGERY — ECHOCARDIOGRAM, TRANSESOPHAGEAL
Anesthesia: Monitor Anesthesia Care

## 2023-03-07 MED ORDER — LIDOCAINE 2% (20 MG/ML) 5 ML SYRINGE
INTRAMUSCULAR | Status: DC | PRN
  Administered 2023-03-07: 20 mg via INTRAVENOUS

## 2023-03-07 MED ORDER — SODIUM CHLORIDE 0.9 % IV SOLN: INTRAVENOUS | Status: DC

## 2023-03-07 MED ORDER — PROPOFOL 500 MG/50ML IV EMUL
INTRAVENOUS | Status: DC | PRN
  Administered 2023-03-07: 100 ug/kg/min via INTRAVENOUS

## 2023-03-07 MED ORDER — PROPOFOL 10 MG/ML IV BOLUS
INTRAVENOUS | Status: DC | PRN
  Administered 2023-03-07: 20 mg via INTRAVENOUS

## 2023-03-07 NOTE — Anesthesia Postprocedure Evaluation (Signed)
Anesthesia Post Note  Patient: Theresa Scott  Procedure(s) Performed: TRANSESOPHAGEAL ECHOCARDIOGRAM     Patient location during evaluation: Cath Lab Anesthesia Type: MAC Level of consciousness: awake and alert Pain management: pain level controlled Vital Signs Assessment: post-procedure vital signs reviewed and stable Respiratory status: spontaneous breathing, nonlabored ventilation, respiratory function stable and patient connected to nasal cannula oxygen Cardiovascular status: stable and blood pressure returned to baseline Postop Assessment: no apparent nausea or vomiting Anesthetic complications: no   No notable events documented.  Last Vitals:  Vitals:   03/07/23 0823 03/07/23 0926  BP: 123/83   Pulse: 69 86  Resp: 14 14  Temp: 36.8 C 36.8 C  SpO2: 94% 95%    Last Pain:  Vitals:   03/07/23 0926  TempSrc:   PainSc: 0-No pain                 Earl Lites P Krayton Wortley

## 2023-03-07 NOTE — Discharge Instructions (Signed)

## 2023-03-07 NOTE — CV Procedure (Signed)
INDICATIONS: Evaluate the mitral valve  PROCEDURE:   Informed consent was obtained prior to the procedure. The risks, benefits and alternatives for the procedure were discussed and the patient comprehended these risks.  Risks include, but are not limited to, cough, sore throat, vomiting, nausea, somnolence, esophageal and stomach trauma or perforation, bleeding, low blood pressure, aspiration, pneumonia, infection, trauma to the teeth and death.    After a procedural time-out, the oropharynx was anesthetized with 20% benzocaine spray.   During this procedure the patient was administered propofol to achieve and maintain moderate conscious sedation.  The patient's heart rate, blood pressure, and oxygen saturationweare monitored continuously during the procedure. The period of conscious sedation was 15 minutes, of which I was present face-to-face 100% of this time.  The transesophageal probe was inserted in the esophagus and stomach without difficulty and multiple views were obtained.  The patient was kept under observation until the patient left the procedure room.  The patient left the procedure room in stable condition.   Agitated microbubble saline contrast was not administered.  COMPLICATIONS:    There were no immediate complications.  FINDINGS:  Normal LV/RV function Normal mitral valve, no regurgitation Trivial TR No LAA thrombus   RECOMMENDATIONS:     No further work up for mitral valve disease at this time indicated  Time Spent Directly with the Patient:  15 minutes   Theresa Scott 03/07/2023, 9:20 AM

## 2023-03-07 NOTE — Pre-Procedure Instructions (Signed)
Jeanice Lim, CRNA made aware patient took [place] yesterday

## 2023-03-07 NOTE — Telephone Encounter (Signed)
Paper Work Dropped Off: patient assistance program    Date: 03/07/2023  Location of paper:  pink envelope in front office   Please call patient if any questions

## 2023-03-07 NOTE — Transfer of Care (Signed)
Immediate Anesthesia Transfer of Care Note  Patient: Theresa Scott  Procedure(s) Performed: TRANSESOPHAGEAL ECHOCARDIOGRAM  Patient Location: Cath Lab  Anesthesia Type:MAC  Level of Consciousness: sedated  Airway & Oxygen Therapy: Patient connected to nasal cannula oxygen  Post-op Assessment: Post -op Vital signs reviewed and stable  Post vital signs: stable  Last Vitals:  Vitals Value Taken Time  BP    Temp    Pulse    Resp    SpO2      Last Pain:  Vitals:   03/07/23 0823  TempSrc: Temporal  PainSc: 0-No pain         Complications: No notable events documented.

## 2023-03-07 NOTE — Interval H&P Note (Signed)
History and Physical Interval Note:  03/07/2023 8:14 AM  Theresa Scott  has presented today for surgery, with the diagnosis of mitral valve regurgitation.  The various methods of treatment have been discussed with the patient and family. After consideration of risks, benefits and other options for treatment, the patient has consented to  Procedure(s): TRANSESOPHAGEAL ECHOCARDIOGRAM (N/A) as a surgical intervention.  The patient's history has been reviewed, patient examined, no change in status, stable for surgery.  I have reviewed the patient's chart and labs.  Questions were answered to the patient's satisfaction.     Maisie Fus

## 2023-03-07 NOTE — Anesthesia Preprocedure Evaluation (Signed)
Anesthesia Evaluation  Patient identified by MRN, date of birth, ID band Patient awake    Reviewed: Allergy & Precautions, NPO status , Patient's Chart, lab work & pertinent test results  Airway Mallampati: II  TM Distance: >3 FB Neck ROM: Full    Dental no notable dental hx.    Pulmonary neg pulmonary ROS, former smoker   Pulmonary exam normal        Cardiovascular hypertension, Pt. on medications and Pt. on home beta blockers + Peripheral Vascular Disease  + Valvular Problems/Murmurs MR  Rhythm:Regular Rate:Normal + Systolic murmurs ECHO 11/23:  1. Left ventricular ejection fraction, by estimation, is 60 to 65%. The  left ventricle has normal function. The left ventricle has no regional  wall motion abnormalities. There is mild concentric left ventricular  hypertrophy. Left ventricular diastolic  parameters are consistent with Grade I diastolic dysfunction (impaired  relaxation).   2. Right ventricular systolic function is normal. The right ventricular  size is normal.   3. Right atrial size was mildly dilated.   4. The mitral valve is normal in structure. Moderate to severe mitral  valve regurgitation. No evidence of mitral stenosis.   5. The aortic valve is normal in structure. Aortic valve regurgitation is  not visualized. No aortic stenosis is present.   6. The inferior vena cava is normal in size with greater than 50%  respiratory variability, suggesting right atrial pressure of 3 mmHg.      Neuro/Psych   Anxiety Depression       GI/Hepatic ,GERD  Medicated,,(+) Hepatitis -  Endo/Other  diabetes    Renal/GU CRFRenal disease  negative genitourinary   Musculoskeletal  (+) Arthritis , Osteoarthritis,    Abdominal Normal abdominal exam  (+)   Peds  Hematology negative hematology ROS (+)   Anesthesia Other Findings   Reproductive/Obstetrics                             Anesthesia  Physical Anesthesia Plan  ASA: 3  Anesthesia Plan: MAC   Post-op Pain Management:    Induction: Intravenous  PONV Risk Score and Plan: 2 and Propofol infusion and Treatment may vary due to age or medical condition  Airway Management Planned: Simple Face Mask and Nasal Cannula  Additional Equipment: None  Intra-op Plan:   Post-operative Plan:   Informed Consent: I have reviewed the patients History and Physical, chart, labs and discussed the procedure including the risks, benefits and alternatives for the proposed anesthesia with the patient or authorized representative who has indicated his/her understanding and acceptance.     Dental advisory given  Plan Discussed with: CRNA  Anesthesia Plan Comments:        Anesthesia Quick Evaluation

## 2023-03-08 ENCOUNTER — Encounter (HOSPITAL_COMMUNITY): Payer: Self-pay | Admitting: Internal Medicine

## 2023-03-08 NOTE — Telephone Encounter (Signed)
**Note De-Identified  Obfuscation** I have completed the providers page of the pts BI Cares application for Jardiance assistance, Dr Eldridge Dace (DOD) signed and dated it in Dr Trula Ore absence and I have faxed all to [facility]. I did receive confirmation that the fax sent successfully.

## 2023-03-08 NOTE — Telephone Encounter (Signed)
**Note De-Identified  Obfuscation** Letter received from [facility] stating that they need the 1st page of the pts application for Jardiance.  I called the pt but got no answer so I left a VM asking her to call Larita Fife back at [facility] at [phone].

## 2023-03-09 ENCOUNTER — Telehealth (HOSPITAL_COMMUNITY): Payer: Self-pay | Admitting: Emergency Medicine

## 2023-03-09 NOTE — Telephone Encounter (Signed)
Reaching out to patient to offer assistance regarding upcoming cardiac imaging study; pt verbalizes understanding of appt date/time, parking situation and where to check in, pre-test NPO status and medications ordered, and verified current allergies; name and call back number provided for further questions should they arise Vonte Rossin RN Navigator Cardiac Imaging [facility] [phone] office [phone] cell   50mg metoprolol tartrate  

## 2023-03-12 ENCOUNTER — Ambulatory Visit (HOSPITAL_COMMUNITY)
Admission: RE | Admit: 2023-03-12 | Discharge: 2023-03-12 | Disposition: A | Discharge status: Home / Self Care | Payer: Medicare Other | Source: Ambulatory Visit | Attending: Internal Medicine | Admitting: Internal Medicine

## 2023-03-12 DIAGNOSIS — K7460 Unspecified cirrhosis of liver: Secondary | ICD-10-CM | POA: Insufficient documentation

## 2023-03-12 DIAGNOSIS — Z794 Long term (current) use of insulin: Secondary | ICD-10-CM | POA: Diagnosis not present

## 2023-03-12 DIAGNOSIS — E1169 Type 2 diabetes mellitus with other specified complication: Secondary | ICD-10-CM

## 2023-03-12 DIAGNOSIS — E119 Type 2 diabetes mellitus without complications: Secondary | ICD-10-CM | POA: Diagnosis not present

## 2023-03-12 DIAGNOSIS — E785 Hyperlipidemia, unspecified: Secondary | ICD-10-CM | POA: Diagnosis not present

## 2023-03-12 DIAGNOSIS — E1159 Type 2 diabetes mellitus with other circulatory complications: Secondary | ICD-10-CM | POA: Diagnosis not present

## 2023-03-12 DIAGNOSIS — R002 Palpitations: Secondary | ICD-10-CM | POA: Insufficient documentation

## 2023-03-12 DIAGNOSIS — I152 Hypertension secondary to endocrine disorders: Secondary | ICD-10-CM

## 2023-03-12 DIAGNOSIS — R072 Precordial pain: Secondary | ICD-10-CM

## 2023-03-12 DIAGNOSIS — R400 Somnolence: Secondary | ICD-10-CM | POA: Insufficient documentation

## 2023-03-12 DIAGNOSIS — I340 Nonrheumatic mitral (valve) insufficiency: Secondary | ICD-10-CM | POA: Diagnosis not present

## 2023-03-12 DIAGNOSIS — R5383 Other fatigue: Secondary | ICD-10-CM | POA: Diagnosis not present

## 2023-03-12 MED ORDER — NITROGLYCERIN 0.4 MG SL SUBL
SUBLINGUAL_TABLET | SUBLINGUAL | Status: AC
  Filled 2023-03-12: qty 2

## 2023-03-12 MED ORDER — NITROGLYCERIN 0.4 MG SL SUBL
0.8000 mg | SUBLINGUAL_TABLET | Freq: Once | SUBLINGUAL | Status: AC
  Administered 2023-03-12: 0.8 mg via SUBLINGUAL

## 2023-03-12 MED ORDER — IOHEXOL 350 MG/ML SOLN
95.0000 mL | Freq: Once | INTRAVENOUS | Status: AC | PRN
  Administered 2023-03-12: 95 mL via INTRAVENOUS

## 2023-03-12 NOTE — Progress Notes (Signed)
CT scan completed. Tolerated well. D/C home in wheelchair with husband. Awake and alert. In no distress. 

## 2023-03-21 ENCOUNTER — Other Ambulatory Visit: Payer: PRIVATE HEALTH INSURANCE

## 2023-03-21 ENCOUNTER — Inpatient Hospital Stay: Admission: RE | Admit: 2023-03-21 | Payer: PRIVATE HEALTH INSURANCE | Source: Ambulatory Visit

## 2023-03-23 ENCOUNTER — Ambulatory Visit
Admission: RE | Admit: 2023-03-23 | Discharge: 2023-03-23 | Disposition: A | Discharge status: Home / Self Care | Payer: Medicare Other | Source: Ambulatory Visit | Attending: Orthopedic Surgery | Admitting: Orthopedic Surgery

## 2023-03-23 DIAGNOSIS — M5116 Intervertebral disc disorders with radiculopathy, lumbar region: Secondary | ICD-10-CM | POA: Diagnosis not present

## 2023-03-23 MED ORDER — MEPERIDINE HCL 50 MG/ML IJ SOLN: 50.0000 mg | Freq: Once | INTRAMUSCULAR | Status: DC | PRN

## 2023-03-23 MED ORDER — IOPAMIDOL (ISOVUE-M 200) INJECTION 41%
15.0000 mL | Freq: Once | INTRAMUSCULAR | Status: AC
  Administered 2023-03-23: 15 mL via INTRATHECAL

## 2023-03-23 MED ORDER — DIAZEPAM 5 MG PO TABS
5.0000 mg | ORAL_TABLET | Freq: Once | ORAL | Status: AC
  Administered 2023-03-23: 5 mg via ORAL

## 2023-03-23 MED ORDER — ONDANSETRON HCL 4 MG/2ML IJ SOLN: 4.0000 mg | Freq: Once | INTRAMUSCULAR | Status: DC | PRN

## 2023-03-23 NOTE — Telephone Encounter (Signed)
Error

## 2023-03-23 NOTE — Discharge Instructions (Signed)

## 2023-03-23 NOTE — Telephone Encounter (Signed)
Pt c/o medication issue:  1. Name of Medication:   empagliflozin (JARDIANCE) 10 MG TABS tablet   2. How are you currently taking this medication (dosage and times per day)?   3. Are you having a reaction (difficulty breathing--STAT)?   4. What is your medication issue?   Patient stated the manufacturer notified her that they are missing page 1 and 4 of her application and she is running out of this medication.  Patient wants to get some samples until she gets this medication assistance application approved.

## 2023-03-23 NOTE — Telephone Encounter (Signed)
Returned pt's call. Spoke to Pts husband, Theresa Scott. Theresa Scott stated Halah was in an appointment and could not speak. Asked Theresa Scott how much medicine she had left and he stated she had at least enough medicine to make it to Monday. (Pt could not confirm since she was in her appointment) Told him that I was advised that the best course of action would be to wait until the Nurse that normally took care of this paperwork was back in the office on Monday and we would go from there. Theresa Scott stated his understanding and reiterated page 1 and 4 had been requested. Theresa Scott verified the number on file [phone] was the best number to call them back on.

## 2023-03-26 NOTE — Telephone Encounter (Signed)
**Note De-Identified  Obfuscation** The pt did complete the first page of the [facility] Cares application for Jardiance assistance and left it in the front office while picking up her Jardiance samples.  I have added it with the rest of the pts BI Cares application for Jardience assistance and faxed all to [facility] with a note written on the cover letter "Missing 1st page of the pts application attached".  I did receive confirmation that the fax sent successfully.

## 2023-03-26 NOTE — Telephone Encounter (Signed)
Medication assistance form left at front desk.

## 2023-03-26 NOTE — Telephone Encounter (Signed)
**Note De-Identified  Obfuscation** The pt is advised that we did not receive the first page of her BI Cares application for Jardiance assistance when she left her completed application at the office. She is aware that I have left her 2 weeks worth of Jardiance samples and the first page of the [facility] Cares application in the front office for her to complete and to leave in the front office  when she comes to the office today to pick up her Jardiance samples.  She verbalized understanding to all information given and thanked me for calling her to discuss the hold up on her application.

## 2023-03-27 NOTE — Telephone Encounter (Signed)
**Note De-Identified Theresa Scott Obfuscation** Per letter received from [facility] fax, they have approved the pt for Jardiance assistance until 10/23/2023. The letter states that they have notified the pt of this approval as well.

## 2023-03-28 DIAGNOSIS — I340 Nonrheumatic mitral (valve) insufficiency: Secondary | ICD-10-CM | POA: Diagnosis not present

## 2023-03-28 DIAGNOSIS — I152 Hypertension secondary to endocrine disorders: Secondary | ICD-10-CM | POA: Diagnosis not present

## 2023-03-29 DIAGNOSIS — M791 Myalgia, unspecified site: Secondary | ICD-10-CM | POA: Diagnosis not present

## 2023-03-29 DIAGNOSIS — M4326 Fusion of spine, lumbar region: Secondary | ICD-10-CM | POA: Diagnosis not present

## 2023-03-29 DIAGNOSIS — G894 Chronic pain syndrome: Secondary | ICD-10-CM | POA: Diagnosis not present

## 2023-03-30 ENCOUNTER — Other Ambulatory Visit: Payer: Self-pay | Admitting: *Deleted

## 2023-03-30 DIAGNOSIS — F322 Major depressive disorder, single episode, severe without psychotic features: Secondary | ICD-10-CM

## 2023-03-30 MED ORDER — DESVENLAFAXINE SUCCINATE ER 50 MG PO TB24
50.0000 mg | ORAL_TABLET | Freq: Every day | ORAL | 1 refills | Status: DC
Start: 2023-03-30 — End: 2023-09-10

## 2023-04-09 ENCOUNTER — Telehealth: Payer: Self-pay | Admitting: *Deleted

## 2023-04-09 ENCOUNTER — Ambulatory Visit (INDEPENDENT_AMBULATORY_CARE_PROVIDER_SITE_OTHER): Payer: Medicare Other | Admitting: Physician Assistant

## 2023-04-09 DIAGNOSIS — Z0000 Encounter for general adult medical examination without abnormal findings: Secondary | ICD-10-CM

## 2023-04-09 DIAGNOSIS — Z780 Asymptomatic menopausal state: Secondary | ICD-10-CM

## 2023-04-09 DIAGNOSIS — Z1231 Encounter for screening mammogram for malignant neoplasm of breast: Secondary | ICD-10-CM

## 2023-04-09 NOTE — Telephone Encounter (Signed)
Called [employer] LVM w/Jackie advising that this order was faxed over on 5/8 an a confirmation was received and the patient hasn't heard anything. Asked that she RTN call about this.   Called Ms. Theresa Scott and informed her that I had called [employer] and LVM and I'm awaiting a return call. Advised her that I would call her back once I heard back about her order.

## 2023-04-09 NOTE — Telephone Encounter (Signed)
Annice Pih w/[employer] called back and stated that she had spoken to the patient and because she lives in [place] she would like to have the this done there.   She gave me the phone # for the office in [place] [phone]. I called and spoke w/Tawanna and she stated that their office doesn't do it there and that this is only done at their [facility] location. She gave me the number to the [facility] office. [phone]  I spoke to Misty Stanley and she gave me their fax # to send the order for the Overnight O2. [phone]   Called pt and informed her that this was done and they should be contacting her to get her set up.   Order faxed and confirmation received

## 2023-04-09 NOTE — Progress Notes (Signed)
MEDICARE ANNUAL WELLNESS VISIT  04/09/2023  Telephone Visit Disclaimer This Medicare AWV was conducted by telephone due to national recommendations for restrictions regarding the COVID-19 Pandemic (e.g. social distancing).  I verified, using two identifiers, that I am speaking with Theresa Scott or their authorized healthcare agent. I discussed the limitations, risks, security, and privacy concerns of performing an evaluation and management service by telephone and the potential availability of an in-person appointment in the future. The patient expressed understanding and agreed to proceed.  Location of Patient: Home Location of Provider (nurse):  In the office.  Subjective:    Theresa Scott is a 67 y.o. female patient of [name], Barbarann Ehlers, MD who had a Medicare Annual Wellness Visit today via telephone. Theresa Scott is Retired and lives with their family. she has 4 children. she reports that she is socially active and does interact with friends/family regularly. she is moderately physically active and enjoys corcheting.  Patient Care Team: Agapito Games, MD as PCP - General (Family Medicine) Orbie Pyo, MD as PCP - Cardiology (Cardiology) Dicie Beam, MD as Consulting Physician Iva Boop, MD as Consulting Physician (Gastroenterology)     04/09/2023   10:56 AM 12/18/2022    8:31 PM 04/03/2022    3:13 PM 07/08/2021    4:51 PM 01/19/2021    9:18 AM 06/17/2019   11:03 AM 01/08/2018    6:32 PM  Advanced Directives  Does Patient Have a Medical Advance Directive? No No No No No No No  Would patient like information on creating a medical advance directive? No - Patient declined No - Patient declined No - Patient declined No - Patient declined No - Patient declined No - Patient declined     Hospital Utilization Over the Past 12 Months: # of hospitalizations or ER visits: 0 # of surgeries: 0  Review of Systems    Patient reports that her overall health is worse  compared to last year.  History obtained from chart review and the patient  Patient Reported Readings (BP, Pulse, CBG, Weight, etc) none  Pain Assessment Pain : 0-10 Pain Score: 7  Pain Type: Chronic pain Pain Location: Back Pain Orientation: Lower Pain Descriptors / Indicators: Constant Pain Onset: More than a month ago Pain Frequency: Intermittent     Current Medications & Allergies (verified) Allergies as of 04/09/2023       Reactions   Bee Venom Anaphylaxis   Latex Rash        Medication List        Accurate as of April 09, 2023 11:07 AM. If you have any questions, ask your nurse or doctor.          STOP taking these medications    metoprolol tartrate 50 MG tablet Commonly known as: LOPRESSOR       TAKE these medications    Accu-Chek FastClix Lancets Misc Dx DM E11.9 Check fasting blood sugar every morning and after largest meal a few days a week.   Accu-Chek SmartView test strip Generic drug: glucose blood Dx DM E11.9 Check fasting blood sugar every morning and after largest meal a few days a week.   alendronate 70 MG tablet Commonly known as: FOSAMAX TAKE 1 TABLET EVERY WEEK WITH A FULL GLASS OF WATER ON AN EMPTY STOMACH.   AMBULATORY NON FORMULARY MEDICATION Medication Name: Overnight pulse ox, excess sedation, fatigue and daytime sleepiness.   aspirin EC 81 MG tablet Take 1 tablet (81 mg total) by  mouth daily. Swallow whole.   atorvastatin 20 MG tablet Commonly known as: LIPITOR TAKE 1 TABLET EVERY DAY   B-D SINGLE USE SWABS REGULAR Pads USE TOPICALLY  1  TO  2  TIMES  DAILY AS DIRECTED   baclofen 10 MG tablet Commonly known as: LIORESAL Take 10 mg by mouth daily.   buPROPion 150 MG 24 hr tablet Commonly known as: WELLBUTRIN XL TAKE 1 TABLET EVERY DAY   carvedilol 6.25 MG tablet Commonly known as: Coreg Take 1 tablet (6.25 mg total) by mouth 2 (two) times daily with a meal.   cholecalciferol 25 MCG (1000 UNIT) tablet Commonly  known as: VITAMIN D3 Take 1,000 Units by mouth daily.   desvenlafaxine 50 MG 24 hr tablet Commonly known as: PRISTIQ Take 1 tablet (50 mg total) by mouth daily.   docusate sodium 100 MG capsule Commonly known as: COLACE Take 100 mg by mouth daily.   empagliflozin 10 MG Tabs tablet Commonly known as: Jardiance Take 1 tablet (10 mg total) by mouth daily before breakfast.   ferrous sulfate 325 (65 FE) MG EC tablet Take 325 mg by mouth daily.   gabapentin 300 MG capsule Commonly known as: NEURONTIN TAKE 2 CAPSULES EVERY DAY   losartan 25 MG tablet Commonly known as: COZAAR Take 1 tablet (25 mg total) by mouth daily.   metFORMIN 500 MG 24 hr tablet Commonly known as: GLUCOPHAGE-XR TAKE 1 TABLET EVERY DAY WITH BREAKFAST   omeprazole 40 MG capsule Commonly known as: PRILOSEC Take 1 capsule (40 mg total) by mouth daily. What changed:  when to take this reasons to take this   ondansetron 4 MG disintegrating tablet Commonly known as: ZOFRAN-ODT DISSOLVE 1 TABLET ON THE TONGUE EVERY 8 HOURS AS NEEDED FOR NAUSEA AND VOMITING   Oxycodone HCl 10 MG Tabs Take 10 mg by mouth 3 (three) times daily as needed (pain).   triamcinolone cream 0.1 % Commonly known as: KENALOG Apply 1 Application topically daily as needed.   VITAMIN B-12 PO Take 1 tablet by mouth every Wednesday.        History (reviewed): Past Medical History:  Diagnosis Date   Allergy    Anxiety    Cancer (HCC) 08-22-2022   Just found out I have cirrhosis   Cataract 2019   Chronic kidney disease    Complication of anesthesia    hard to wake up and swelling after kidney stone removal in 1980 per pt   Degenerative disc disease    Depression    Diabetes mellitus    Fatty liver disease, nonalcoholic    Gallstones    GERD (gastroesophageal reflux disease)    Hypercholesterolemia    Hypertension    Kidney stones    Obesity    Osteoporosis    Pancreatitis    SOB (shortness of breath)    Swelling of  both lower extremities    Past Surgical History:  Procedure Laterality Date   BACK SURGERY  1990,2003,2004,2010   BREAST SURGERY     CARDIAC CATHETERIZATION  2011   [facility].    CARDIAC SURGERY  2002   ablation   CHOLECYSTECTOMY  1976   COLONOSCOPY  2008   ESOPHAGOGASTRODUODENOSCOPY N/A 01/27/2013   Procedure: ESOPHAGOGASTRODUODENOSCOPY (EGD) upper endoscopy;  Surgeon: Kandis Cocking, MD;  Location: [facility];  Service: General;  Laterality: N/A;   EYE SURGERY     GASTRIC BYPASS  08/22/2010   HAND SURGERY  2000   right   SPINE SURGERY  1990,2001,2004,2005,2015   SUBTHALAMIC STIMULATOR BATTERY REPLACEMENT  08/16/2020   TEE WITHOUT CARDIOVERSION N/A 03/07/2023   Procedure: TRANSESOPHAGEAL ECHOCARDIOGRAM;  Surgeon: Maisie Fus, MD;  Location: [facility] INV[facility]IVE CV LAB;  Service: Cardiovascular;  Laterality: N/A;   TONSILLECTOMY  1971   Family History  Problem Relation Age of Onset   Lung cancer Mother    Alcohol abuse Mother    Cancer Mother    COPD Mother    Miscarriages / [place] Mother    Addison's disease Father    Cancer Sister    Diabetes Sister    COPD Sister    Heart attack Sister    Lung cancer Sister    Cirrhosis Sister        secondary medication   Diabetes Sister    Colon cancer Brother        late 67's   Alcohol abuse Brother    Cancer Brother    Liver cancer Brother    Alcohol abuse Brother    Cancer Brother    COPD Brother    Kidney failure Brother        Dialysis at 25, born single kidney   Alcohol abuse Daughter    Esophageal cancer Neg Hx    Stomach cancer Neg Hx    Rectal cancer Neg Hx    Social History   Socioeconomic History   Marital status: Married    Spouse name: Susann Givens   Number of children: 4   Years of education: 9   Highest education level: GED or equivalent  Occupational History   Occupation: [employer]    Comment: retired  Tobacco Use   Smoking status: Former    Packs/day: 0.50    Years: 18.00     Additional pack years: 0.00    Total pack years: 9.00    Types: Cigarettes    Quit date: 08/04/1990    Years since quitting: 32.7   Smokeless tobacco: Never  Vaping Use   Vaping Use: Never used  Substance and Sexual Activity   Alcohol use: No   Drug use: No   Sexual activity: Yes    Birth control/protection: None  Other Topics Concern   Not on file  Social History Narrative   Married 1 daughter (46) and 3 adopted children (5, 3 months)   Disabled, chronic back pain, 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   She enjoys crocheting       Social Determinants of Health   Financial Resource Strain: Low Risk  (04/04/2023)   Overall Financial Resource Strain (CARDIA)    Difficulty of Paying Living Expenses: Not hard at all  Food Insecurity: No Food Insecurity (04/04/2023)   Hunger Vital Sign    Worried About Running Out of Food in the Last Year: Never true    Ran Out of Food in the Last Year: Never true  Transportation Needs: No Transportation Needs (04/04/2023)   PRAPARE - Administrator, Civil Service (Medical): No    Lack of Transportation (Non-Medical): No  Physical Activity: Inactive (04/04/2023)   Exercise Vital Sign    Days of Exercise per Week: 0 days    Minutes of Exercise per Session: 0 min  Stress: No Stress Concern Present (04/04/2023)   [employer] of Occupational Health - Occupational Stress Questionnaire    Feeling of Stress : Only a little  Social Connections: Socially Integrated (04/09/2023)   Social Connection and Isolation Panel [NHANES]    Frequency of Communication with Friends and Family: More  than three times a week    Frequency of Social Gatherings with Friends and Family: More than three times a week    Attends Religious Services: More than 4 times per year    Active Member of Clubs or Organizations: No    Attends Engineer, structural: More than 4 times per year    Marital Status: Married    Activities of Daily Living     04/04/2023    9:21 AM 03/07/2023    8:23 AM  In your present state of health, do you have any difficulty performing the following activities:  Hearing? 0 0  Vision? 0 0  Difficulty concentrating or making decisions? 0 0  Walking or climbing stairs? 0 0  Dressing or bathing? 0 0  Doing errands, shopping? 0   Preparing Food and eating ? N   Using the Toilet? N   In the past six months, have you accidently leaked urine? Y   Do you have problems with loss of bowel control? Y   Managing your Medications? N   Managing your Finances? N   Housekeeping or managing your Housekeeping? N     Patient Education/ Literacy How often do you need to have someone help you when you read instructions, pamphlets, or other written materials from your doctor or pharmacy?: 1 - Never What is the last grade level you completed in school?: 9th/GED  Exercise Current Exercise Habits: The patient does not participate in regular exercise at present, Exercise limited by: orthopedic condition(s)  Diet Patient reports consuming  2-3  meals a day and 2 snack(s) a day Patient reports that her primary diet is: Regular Patient reports that she does have regular access to food.   Depression Screen    04/09/2023   10:57 AM 07/31/2022    8:41 AM 04/03/2022    3:13 PM 12/19/2021    3:41 PM 03/03/2021    9:56 AM 01/19/2021    9:24 AM 03/11/2020    9:32 AM  PHQ 2/9 Scores  PHQ - 2 Score 0 2 0 2 2 0 0  PHQ- 9 Score  8  11 5        Fall Risk    04/09/2023   10:57 AM 04/04/2023    9:21 AM 07/03/2022    9:58 AM 04/01/2022    2:51 AM 03/03/2021    9:55 AM  Fall Risk   Falls in the past year? 0 0 0 1 1  Number falls in past yr: 0  0 0 0  Injury with Fall? 0 0 0 1 0  Risk for fall due to : No Fall Risks  No Fall Risks  Impaired balance/gait  Follow up Falls evaluation completed  Falls evaluation completed  Falls prevention discussed;Falls evaluation completed     Objective:  LAMECA KOLAKOWSKI seemed alert and oriented and  she participated appropriately during our telephone visit.  Blood Pressure Weight BMI  BP Readings from Last 3 Encounters:  03/23/23 (!) 145/76  03/12/23 109/66  03/07/23 123/83   Wt Readings from Last 3 Encounters:  03/07/23 187 lb (84.8 kg)  02/26/23 187 lb (84.8 kg)  01/17/23 184 lb (83.5 kg)   BMI Readings from Last 1 Encounters:  03/07/23 31.12 kg/m    *Unable to obtain current vital signs, weight, and BMI due to telephone visit type  Hearing/Vision  Brucha did not seem to have difficulty with hearing/understanding during the telephone conversation Reports that she has had a formal eye exam  by an eye care professional within the past year Reports that she has not had a formal hearing evaluation within the past year *Unable to fully assess hearing and vision during telephone visit type  Cognitive Function:    04/09/2023   11:00 AM 04/03/2022    3:17 PM 01/19/2021    9:26 AM 06/17/2019   11:07 AM 06/05/2019    9:37 AM  6CIT Screen  What Year? 0 points 0 points 0 points 0 points 0 points  What month? 0 points 0 points 0 points 0 points 0 points  What time? 0 points 0 points 0 points 0 points 0 points  Count back from 20 0 points 0 points 0 points 0 points 0 points  Months in reverse 0 points 0 points 0 points 0 points 0 points  Repeat phrase 2 points 0 points 0 points 2 points 2 points  Total Score 2 points 0 points 0 points 2 points 2 points   (Normal:0-7, Significant for Dysfunction: >8)  Normal Cognitive Function Screening: Yes   Immunization & Health Maintenance Record Immunization History  Administered Date(s) Administered   Fluad Quad(high Dose 65+) 07/03/2022   Hep A / Hep B 08/06/2018, 11/27/2018   Influenza Split 08/15/2011, 09/09/2012   Influenza Whole 07/20/2008, 08/12/2009, 08/03/2010   Influenza, High Dose Seasonal PF 07/18/2017, 06/07/2019   Influenza,inj,Quad PF,6+ Mos 06/26/2013, 09/15/2014, 07/01/2015, 08/21/2016, 08/06/2018, 06/07/2019    PFIZER(Purple Top)SARS-COV-2 Vaccination 06/10/2020, 07/01/2020   Pneumococcal Polysaccharide-23 08/06/2018   Pneumococcal-Unspecified 10/23/2001   Tdap 11/19/2015    Health Maintenance  Topic Date Due   Diabetic kidney evaluation - Urine ACR  05/04/2023   COVID-19 Vaccine (3 - Pfizer risk series) 04/25/2023 (Originally 07/29/2020)   Zoster Vaccines- Shingrix (1 of 2) 07/10/2023 (Originally 04/12/1975)   Pneumonia Vaccine 61+ Years old (2 of 2 - PCV) 04/08/2024 (Originally 08/07/2019)   INFLUENZA VACCINE  05/24/2023   DEXA SCAN  07/21/2023   Diabetic kidney evaluation - eGFR measurement  03/04/2024   Medicare Annual Wellness (AWV)  04/08/2024   MAMMOGRAM  09/27/2024   DTaP/Tdap/Td (2 - Td or Tdap) 11/18/2025   Hepatitis C Screening  Completed   HPV VACCINES  Aged Out   Colonoscopy  Discontinued       Assessment  This is a routine wellness examination for Theresa Scott.  Health Maintenance: Due or Overdue Health Maintenance Due  Topic Date Due   Diabetic kidney evaluation - Urine ACR  05/04/2023    Theresa Scott does not need a referral for Community Assistance: Care Management:   no Social Work:    no Prescription Assistance:  no Nutrition/Diabetes Education:  no   Plan:  Personalized Goals  Goals Addressed               This Visit's Progress     Patient Stated (pt-stated)        Patient stated that she would like to loose some weight.       Personalized Health Maintenance & Screening Recommendations  Mammogram- Due in December Bone density scan- due in September Shingles vaccine Pneumonia vaccine  Lung Cancer Screening Recommended: no (Low Dose CT Chest recommended if Age 26-80 years, 20 pack-year currently smoking OR have quit w/in past 15 years) Hepatitis C Screening recommended: no HIV Screening recommended: no  Advanced Directives: Written information was not prepared per patient's request.  Referrals & Orders Orders Placed This Encounter   Procedures   Mammogram 3D SCREEN BREAST BILATERAL   DEXAScan  Follow-up Plan Follow-up with Agapito Games, MD as planned Schedule shingles vaccine at the pharmacy. Medicare wellness visit in one year.  Patient will access AVS on my chart.   I have personally reviewed and noted the following in the patient's chart:   Medical and social history Use of alcohol, tobacco or illicit drugs  Current medications and supplements Functional ability and status Nutritional status Physical activity Advanced directives List of other physicians Hospitalizations, surgeries, and ER visits in previous 12 months Vitals Screenings to include cognitive, depression, and falls Referrals and appointments  In addition, I have reviewed and discussed with Theresa Scott certain preventive protocols, quality metrics, and best practice recommendations. A written personalized care plan for preventive services as well as general preventive health recommendations is available and can be mailed to the patient at her request.      Modesto Charon, RN BSN  04/09/2023

## 2023-04-09 NOTE — Patient Instructions (Addendum)
MEDICARE ANNUAL WELLNESS VISIT Health Maintenance Summary and Written Plan of Care  Theresa Scott ,  Thank you for allowing me to perform your Medicare Annual Wellness Visit and for your ongoing commitment to your health.   Health Maintenance & Immunization History Health Maintenance  Topic Date Due   Diabetic kidney evaluation - Urine ACR  05/04/2023   COVID-19 Vaccine (3 - Pfizer risk series) 04/25/2023 (Originally 07/29/2020)   Zoster Vaccines- Shingrix (1 of 2) 07/10/2023 (Originally 04/12/1975)   Pneumonia Vaccine 64+ Years old (2 of 2 - PCV) 04/08/2024 (Originally 08/07/2019)   INFLUENZA VACCINE  05/24/2023   DEXA SCAN  07/21/2023   Diabetic kidney evaluation - eGFR measurement  03/04/2024   Medicare Annual Wellness (AWV)  04/08/2024   MAMMOGRAM  09/27/2024   DTaP/Tdap/Td (2 - Td or Tdap) 11/18/2025   Hepatitis C Screening  Completed   HPV VACCINES  Aged Out   Colonoscopy  Discontinued   Immunization History  Administered Date(s) Administered   Fluad Quad(high Dose 65+) 07/03/2022   Hep A / Hep B 08/06/2018, 11/27/2018   Influenza Split 08/15/2011, 09/09/2012   Influenza Whole 07/20/2008, 08/12/2009, 08/03/2010   Influenza, High Dose Seasonal PF 07/18/2017, 06/07/2019   Influenza,inj,Quad PF,6+ Mos 06/26/2013, 09/15/2014, 07/01/2015, 08/21/2016, 08/06/2018, 06/07/2019   PFIZER(Purple Top)SARS-COV-2 Vaccination 06/10/2020, 07/01/2020   Pneumococcal Polysaccharide-23 08/06/2018   Pneumococcal-Unspecified 10/23/2001   Tdap 11/19/2015    These are the patient goals that we discussed:  Goals Addressed               This Visit's Progress     Patient Stated (pt-stated)        Patient stated that she would like to loose some weight.         This is a list of Health Maintenance Items that are overdue or due now: Mammogram- Due in December Bone density scan- due in September Shingles vaccine Pneumonia vaccine  Orders/Referrals Placed Today: Orders Placed This  Encounter  Procedures   Mammogram 3D SCREEN BREAST BILATERAL    Standing Status:   Future    Standing Expiration Date:   04/08/2024    Scheduling Instructions:     Please call patient to schedule- last one 09/27/22.    Order Specific Question:   Reason for Exam (SYMPTOM  OR DIAGNOSIS REQUIRED)    Answer:   breast cancer screening    Order Specific Question:   Preferred imaging location?    [facility]    Standing Status:   Future    Standing Expiration Date:   04/08/2024    Scheduling Instructions:     Please call patient to schedule. Last one was 06/28/21    Order Specific Question:   Reason for exam:    Answer:   post menopausal    Order Specific Question:   Preferred imaging location?    Answer:   [facility]    (Contact our referral department at [phone] if you have not spoken with someone about your referral appointment within the next 5 days)    Follow-up Plan Follow-up with Agapito Games, MD as planned Schedule shingles vaccine at the pharmacy. Medicare wellness visit in one year.  Patient will access AVS on my chart.     Health Maintenance, Female Adopting a healthy lifestyle and getting preventive care are important in promoting health and wellness. Ask your health care provider about: The right schedule for you to have regular tests and exams. Things you  can do on your own to prevent diseases and keep yourself healthy. What should I know about diet, weight, and exercise? Eat a healthy diet  Eat a diet that includes plenty of vegetables, fruits, low-fat dairy products, and lean protein. Do not eat a lot of foods that are high in solid fats, added sugars, or sodium. Maintain a healthy weight Body mass index (BMI) is used to identify weight problems. It estimates body fat based on height and weight. Your health care provider can help determine your BMI and help you achieve or maintain a healthy weight. Get regular  exercise Get regular exercise. This is one of the most important things you can do for your health. Most adults should: Exercise for at least 150 minutes each week. The exercise should increase your heart rate and make you sweat (moderate-intensity exercise). Do strengthening exercises at least twice a week. This is in addition to the moderate-intensity exercise. Spend less time sitting. Even light physical activity can be beneficial. Watch cholesterol and blood lipids Have your blood tested for lipids and cholesterol at 67 years of age, then have this test every 5 years. Have your cholesterol levels checked more often if: Your lipid or cholesterol levels are high. You are older than 67 years of age. You are at high risk for heart disease. What should I know about cancer screening? Depending on your health history and family history, you may need to have cancer screening at various ages. This may include screening for: Breast cancer. Cervical cancer. Colorectal cancer. Skin cancer. Lung cancer. What should I know about heart disease, diabetes, and high blood pressure? Blood pressure and heart disease High blood pressure causes heart disease and increases the risk of stroke. This is more likely to develop in people who have high blood pressure readings or are overweight. Have your blood pressure checked: Every 3-5 years if you are 26-77 years of age. Every year if you are 62 years old or older. Diabetes Have regular diabetes screenings. This checks your fasting blood sugar level. Have the screening done: Once every three years after age 76 if you are at a normal weight and have a low risk for diabetes. More often and at a younger age if you are overweight or have a high risk for diabetes. What should I know about preventing infection? Hepatitis B If you have a higher risk for hepatitis B, you should be screened for this virus. Talk with your health care provider to find out if you are at  risk for hepatitis B infection. Hepatitis C Testing is recommended for: Everyone born from [birth year] through 1965. Anyone with known risk factors for hepatitis C. Sexually transmitted infections (STIs) Get screened for STIs, including gonorrhea and chlamydia, if: You are sexually active and are younger than 67 years of age. You are older than 67 years of age and your health care provider tells you that you are at risk for this type of infection. Your sexual activity has changed since you were last screened, and you are at increased risk for chlamydia or gonorrhea. Ask your health care provider if you are at risk. Ask your health care provider about whether you are at high risk for HIV. Your health care provider may recommend a prescription medicine to help prevent HIV infection. If you choose to take medicine to prevent HIV, you should first get tested for HIV. You should then be tested every 3 months for as long as you are taking the medicine. Pregnancy If  you are about to stop having your period (premenopausal) and you may become pregnant, seek counseling before you get pregnant. Take 400 to 800 micrograms (mcg) of folic acid every day if you become pregnant. Ask for birth control (contraception) if you want to prevent pregnancy. Osteoporosis and menopause Osteoporosis is a disease in which the bones lose minerals and strength with aging. This can result in bone fractures. If you are 9 years old or older, or if you are at risk for osteoporosis and fractures, ask your health care provider if you should: Be screened for bone loss. Take a calcium or vitamin D supplement to lower your risk of fractures. Be given hormone replacement therapy (HRT) to treat symptoms of menopause. Follow these instructions at home: Alcohol use Do not drink alcohol if: Your health care provider tells you not to drink. You are pregnant, may be pregnant, or are planning to become pregnant. If you drink alcohol: Limit  how much you have to: 0-1 drink a day. Know how much alcohol is in your drink. In the [place] one drink equals one 12 oz bottle of beer (355 mL), one 5 oz glass of wine (148 mL), or one 1 oz glass of hard liquor (44 mL). Lifestyle Do not use any products that contain nicotine or tobacco. These products include cigarettes, chewing tobacco, and vaping devices, such as e-cigarettes. If you need help quitting, ask your health care provider. Do not use street drugs. Do not share needles. Ask your health care provider for help if you need support or information about quitting drugs. General instructions Schedule regular health, dental, and eye exams. Stay current with your vaccines. Tell your health care provider if: You often feel depressed. You have ever been abused or do not feel safe at home. Summary Adopting a healthy lifestyle and getting preventive care are important in promoting health and wellness. Follow your health care provider's instructions about healthy diet, exercising, and getting tested or screened for diseases. Follow your health care provider's instructions on monitoring your cholesterol and blood pressure. This information is not intended to replace advice given to you by your health care provider. Make sure you discuss any questions you have with your health care provider. Document Revised: 02/28/2021 Document Reviewed: 02/28/2021 Elsevier Patient Education  2024 [employer].

## 2023-04-09 NOTE — Telephone Encounter (Signed)
-----   Message from Modesto Charon, RN sent at 04/09/2023 11:08 AM EDT ----- Regarding: question regarding oxygen study Patient has a few questions regarding an oxygen study. If you could please give her a call at your convenience. Thank you.  Theresa Scott

## 2023-04-11 DIAGNOSIS — R5383 Other fatigue: Secondary | ICD-10-CM | POA: Diagnosis not present

## 2023-04-11 DIAGNOSIS — G4730 Sleep apnea, unspecified: Secondary | ICD-10-CM | POA: Diagnosis not present

## 2023-04-30 DIAGNOSIS — M791 Myalgia, unspecified site: Secondary | ICD-10-CM | POA: Diagnosis not present

## 2023-04-30 DIAGNOSIS — G894 Chronic pain syndrome: Secondary | ICD-10-CM | POA: Diagnosis not present

## 2023-04-30 DIAGNOSIS — M4326 Fusion of spine, lumbar region: Secondary | ICD-10-CM | POA: Diagnosis not present

## 2023-05-09 ENCOUNTER — Telehealth: Payer: Self-pay | Admitting: Family Medicine

## 2023-05-09 DIAGNOSIS — G4734 Idiopathic sleep related nonobstructive alveolar hypoventilation: Secondary | ICD-10-CM

## 2023-05-09 NOTE — Telephone Encounter (Signed)
Call pt: overnight ox test shows she did drop below 88%. She would qualify for ON oxygen. See if she would e willing to lt [place] order that through [facility]

## 2023-05-10 NOTE — Telephone Encounter (Signed)
Called pt and lvm advising pt of recommendations and asked that she rtn call

## 2023-05-23 ENCOUNTER — Encounter: Payer: Self-pay | Admitting: Family Medicine

## 2023-05-23 DIAGNOSIS — G4734 Idiopathic sleep related nonobstructive alveolar hypoventilation: Secondary | ICD-10-CM

## 2023-05-23 HISTORY — DX: Idiopathic sleep related nonobstructive alveolar hypoventilation: G47.34

## 2023-05-23 NOTE — Telephone Encounter (Signed)
Will resend order

## 2023-05-23 NOTE — Telephone Encounter (Signed)
Order printed and placed in basket. Will need to order through DME supplier

## 2023-05-23 NOTE — Telephone Encounter (Signed)
Can we try to call again?

## 2023-05-30 NOTE — Telephone Encounter (Signed)
Orders faxed to [facility]. Confirmation received.

## 2023-06-01 ENCOUNTER — Encounter: Payer: Self-pay | Admitting: Family Medicine

## 2023-06-11 ENCOUNTER — Other Ambulatory Visit: Payer: Self-pay | Admitting: Family Medicine

## 2023-06-18 ENCOUNTER — Other Ambulatory Visit: Payer: Self-pay | Admitting: Sports Medicine

## 2023-06-18 ENCOUNTER — Ambulatory Visit (INDEPENDENT_AMBULATORY_CARE_PROVIDER_SITE_OTHER): Payer: Medicare Other | Admitting: Sports Medicine

## 2023-06-18 ENCOUNTER — Encounter: Payer: Self-pay | Admitting: Sports Medicine

## 2023-06-18 ENCOUNTER — Ambulatory Visit (INDEPENDENT_AMBULATORY_CARE_PROVIDER_SITE_OTHER): Payer: Medicare Other

## 2023-06-18 VITALS — BP 134/70 | HR 72

## 2023-06-18 DIAGNOSIS — R42 Dizziness and giddiness: Secondary | ICD-10-CM

## 2023-06-18 DIAGNOSIS — R519 Headache, unspecified: Secondary | ICD-10-CM | POA: Diagnosis not present

## 2023-06-18 MED ORDER — DIAZEPAM 5 MG PO TABS
5.0000 mg | ORAL_TABLET | Freq: Three times a day (TID) | ORAL | 0 refills | Status: DC | PRN
Start: 2023-06-18 — End: 2023-07-02

## 2023-06-18 MED ORDER — PREDNISONE 50 MG PO TABS
50.0000 mg | ORAL_TABLET | Freq: Every day | ORAL | 0 refills | Status: DC
Start: 2023-06-18 — End: 2023-06-18

## 2023-06-18 MED ORDER — DIAZEPAM 5 MG PO TABS
5.0000 mg | ORAL_TABLET | Freq: Three times a day (TID) | ORAL | 0 refills | Status: DC | PRN
Start: 2023-06-18 — End: 2023-06-18

## 2023-06-18 MED ORDER — PREDNISONE 50 MG PO TABS: 50.0000 mg | ORAL_TABLET | Freq: Every day | ORAL | 0 refills | Status: DC

## 2023-06-18 MED ORDER — ONDANSETRON 8 MG PO TBDP
8.0000 mg | ORAL_TABLET | Freq: Three times a day (TID) | ORAL | 3 refills | Status: DC | PRN
Start: 2023-06-18 — End: 2023-10-26

## 2023-06-18 MED ORDER — ONDANSETRON 8 MG PO TBDP
8.0000 mg | ORAL_TABLET | Freq: Three times a day (TID) | ORAL | 3 refills | Status: DC | PRN
Start: 2023-06-18 — End: 2023-06-18

## 2023-06-18 NOTE — Progress Notes (Signed)
    Procedures performed today:    None.  Independent interpretation of notes and tests performed by another provider:   None.  Brief History, Exam, Impression, and Recommendations:    Vertigo This is a pleasant 67 year old female, she is having a recent onset episode of acute vertigo with nausea, vomiting. It started with a headache, tiredness, sore throat. Subsequently she developed nausea and vomiting without diarrhea. The room spinning type dizziness began afterwards worse with head turning. She also feels somewhat confused. She has a normal neurologic exam with normal cranial nerves II through XII, negative pronator drift, no dysdiadochokinesis, no finger dysmetria, sensation grossly equal bilaterally. Dix-Hallpike maneuver was negative today. We did remove a bit of wax from her right canal which revealed normal ear canals. Her hearing is normal. I do suspect were dealing with an acute labyrinthitis. We will treat her aggressively with steroids, Valium, Zofran, vestibular rehabilitation. As she does have a headache with new onset neurologic symptom we will also get a stat CT of her head. History of cirrhosis, and considering confusion we will also add ammonia levels. Return to see me in 2 to 3 weeks.  I spent 30 minutes of total time managing this patient today, this includes chart review, face to face, and non-face to face time.  ____________________________________________ Ihor Austin. Benjamin Stain, M.D., ABFM., CAQSM., AME. Primary Care and Sports Medicine [facility] [facility]  Adjunct Professor of Family Medicine  [employer]  Restaurant manager, fast food

## 2023-06-18 NOTE — Assessment & Plan Note (Signed)
This is a pleasant 67 year old female, she is having a recent onset episode of acute vertigo with nausea, vomiting. It started with a headache, tiredness, sore throat. Subsequently she developed nausea and vomiting without diarrhea. The room spinning type dizziness began afterwards worse with head turning. She also feels somewhat confused. She has a normal neurologic exam with normal cranial nerves II through XII, negative pronator drift, no dysdiadochokinesis, no finger dysmetria, sensation grossly equal bilaterally. Dix-Hallpike maneuver was negative today. We did remove a bit of wax from her right canal which revealed normal ear canals. Her hearing is normal. I do suspect were dealing with an acute labyrinthitis. We will treat her aggressively with steroids, Valium, Zofran, vestibular rehabilitation. As she does have a headache with new onset neurologic symptom we will also get a stat CT of her head. History of cirrhosis, and considering confusion we will also add ammonia levels. Return to see me in 2 to 3 weeks.

## 2023-06-19 LAB — CBC
Hematocrit: 42.7 % (ref 34.0–46.6)
Hemoglobin: 14 g/dL (ref 11.1–15.9)
MCH: 30.4 pg (ref 26.6–33.0)
MCHC: 32.8 g/dL (ref 31.5–35.7)
MCV: 93 fL (ref 79–97)
Platelets: 130 10*3/uL — ABNORMAL LOW (ref 150–450)
RBC: 4.6 x10E6/uL (ref 3.77–5.28)
RDW: 11.8 % (ref 11.7–15.4)
WBC: 5.7 10*3/uL (ref 3.4–10.8)

## 2023-06-19 LAB — COMPREHENSIVE METABOLIC PANEL
ALT: 38 IU/L — ABNORMAL HIGH (ref 0–32)
AST: 51 IU/L — ABNORMAL HIGH (ref 0–40)
Albumin: 4.2 g/dL (ref 3.9–4.9)
Alkaline Phosphatase: 87 IU/L (ref 44–121)
BUN/Creatinine Ratio: 17 (ref 12–28)
BUN: 13 mg/dL (ref 8–27)
Bilirubin Total: 0.5 mg/dL (ref 0.0–1.2)
CO2: 26 mmol/L (ref 20–29)
Calcium: 9.2 mg/dL (ref 8.7–10.3)
Chloride: 103 mmol/L (ref 96–106)
Creatinine, Ser: 0.78 mg/dL (ref 0.57–1.00)
Globulin, Total: 3.3 g/dL (ref 1.5–4.5)
Glucose: 92 mg/dL (ref 70–99)
Potassium: 4.4 mmol/L (ref 3.5–5.2)
Sodium: 141 mmol/L (ref 134–144)
Total Protein: 7.5 g/dL (ref 6.0–8.5)
eGFR: 83 mL/min/{1.73_m2} (ref >59)

## 2023-06-19 LAB — AMMONIA: Ammonia: 30 ug/dL — ABNORMAL LOW (ref 34–178)

## 2023-06-20 ENCOUNTER — Other Ambulatory Visit: Payer: Self-pay

## 2023-06-20 ENCOUNTER — Ambulatory Visit: Payer: Medicare Other | Attending: Sports Medicine | Admitting: Rehabilitative and Restorative Service Providers"

## 2023-06-20 DIAGNOSIS — R2689 Other abnormalities of gait and mobility: Secondary | ICD-10-CM | POA: Diagnosis present

## 2023-06-20 DIAGNOSIS — R42 Dizziness and giddiness: Secondary | ICD-10-CM | POA: Insufficient documentation

## 2023-06-20 NOTE — Therapy (Signed)
OUTPATIENT PHYSICAL THERAPY VESTIBULAR EVALUATION   Patient Name: Theresa Scott MRN: [id number] DOB:August 07, 1956, 67 y.o., female Today's Date: 06/20/2023  END OF SESSION:  PT End of Session - 06/20/23 1025     Visit Number 1    Number of Visits 16    Date for PT Re-Evaluation 08/19/23    Authorization Type medicare    Progress Note Due on Visit 10    PT Start Time 1017    PT Stop Time 1100    PT Time Calculation (min) 43 min    Activity Tolerance Patient tolerated treatment well    Behavior During Therapy [facility] for tasks assessed/performed            Past Medical History:  Diagnosis Date   Allergy    Anxiety    Cancer (HCC) 08-22-2022   Just found out I have cirrhosis   Cataract 2019   Chronic kidney disease    Complication of anesthesia    hard to wake up and swelling after kidney stone removal in 1980 per pt   Degenerative disc disease    Depression    Diabetes mellitus    Fatty liver disease, nonalcoholic    Gallstones    GERD (gastroesophageal reflux disease)    Hypercholesterolemia    Hypertension    Kidney stones    Liver cirrhosis secondary to NASH (HCC)    Nocturnal hypoxemia 05/23/2023   Obesity    Osteoporosis    Pancreatitis    SOB (shortness of breath)    Swelling of both lower extremities    Past Surgical History:  Procedure Laterality Date   BACK SURGERY  1990,2003,2004,2010   BREAST SURGERY     CARDIAC CATHETERIZATION  2011   [facility].    CARDIAC SURGERY  2002   ablation   CHOLECYSTECTOMY  1976   COLONOSCOPY  2008   ESOPHAGOGASTRODUODENOSCOPY N/A 01/27/2013   Procedure: ESOPHAGOGASTRODUODENOSCOPY (EGD) upper endoscopy;  Surgeon: Kandis Cocking, MD;  Location: [facility];  Service: General;  Laterality: N/A;   EYE SURGERY     GASTRIC BYPASS  08/22/2010   HAND SURGERY  2000   right   SPINE SURGERY  1990,2001,2004,2005,2015   SUBTHALAMIC STIMULATOR BATTERY REPLACEMENT  08/16/2020   TEE WITHOUT CARDIOVERSION N/A 03/07/2023    Procedure: TRANSESOPHAGEAL ECHOCARDIOGRAM;  Surgeon: Maisie Fus, MD;  Location: [facility];  Service: Cardiovascular;  Laterality: N/A;   TONSILLECTOMY  1971   Patient Active Problem List   Diagnosis Date Noted   Vertigo 06/18/2023   Nocturnal hypoxemia 05/23/2023   Fatigue 10/30/2022   Epistaxis 10/30/2022   Liver cirrhosis secondary to NASH (HCC) 10/30/2022   Major depressive disorder, single episode, severe without psychotic features (HCC) 11/03/2019   Osteoporosis 06/05/2019   NAFLD (nonalcoholic fatty liver disease) 78/29/5621   Liver fibrosis (HCC) - suspected 12/04/2016   Aortic calcification (HCC) 04/20/2016   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 11/03/2011   IMPAIRED FASTING GLUCOSE 12/15/2010   ANXIETY 01/31/2010   PSVT 12/01/2009   STASIS DERMATITIS 04/06/2009   ALLERGIC RHINITIS 08/19/2008   Hyperlipemia 06/18/2008   OBESITY 06/18/2008   Peripheral vascular disease, unspecified (HCC) 06/18/2008   GERD 06/18/2008   OSTEOARTHRITIS 06/18/2008   INCONTINENCE 06/18/2008   PROTEINURIA 06/18/2008   NEPHROLITHIASIS, HX OF 06/18/2008    PCP: Monica Becton, MD REFERRING PROVIDER: Monica Becton, MD REFERRING DIAG:  R42 (ICD-10-CM) - Vertigo  THERAPY DIAG:  Dizziness and giddiness  Other abnormalities of gait and mobility  ONSET DATE: 06/16/23  Rationale for Evaluation and Treatment: Rehabilitation  SUBJECTIVE:   SUBJECTIVE STATEMENT: The patient had a sudden onset of dizziness 4 days ago described as bad HA, room spinning, nausea. This same sensation occurred a couple of weeks ago and stopped after minutes. She is continuing to have nausea, fatigue, imbalance and a spinning sensation. She notes a h/o intermittent vertigo.  She also reports sleep testing indicating her O2 level is dropping at night into the 80s. She  underwent a CT scan of the brain-no acute abnormalities. She also reports double vision and visual blurriness.  Pt accompanied by: self  PERTINENT HISTORY: h/o 7 back surgeries (has a spinal stimulator), liver cirrhosis, OA, osteoporosis.  PAIN:  Are you having pain? No  PRECAUTIONS: Fall  RED FLAGS: *sudden onset of vertigo with HA-- has had a CT scan  WEIGHT BEARING RESTRICTIONS: No  FALLS: Has patient fallen in last 6 months? No  LIVING ENVIRONMENT: Lives with: lives with their family has 3 adopted children and husband at home Lives in: House/apartment Stairs: Yes: External: 2 steps;    PLOF: Independent  PATIENT GOALS: "Where the dizziness will stop."  OBJECTIVE:   DIAGNOSTIC FINDINGS: CT IMPRESSION: 1. Stable chronic small-vessel ischemic changes as above. No acute intracranial process.  Baseline Dizziness: Rates 7/10  SENSATION: Notes L side facial numbness since dizziness started.   Cervical ROM:  WFLs  GAIT: Comments: slowed speed with gait-- to be further assessed  PATIENT SURVEYS:  N/a-- due to vertigo dx  VESTIBULAR ASSESSMENT:  Vitals: 129/76, HR=72 GENERAL OBSERVATION: Patient moves slowly walking into PT clinic independently without a device.   SYMPTOM BEHAVIOR:  Subjective history: The patient reports sudden onset of vertigo approximately 4 days ago.   Non-Vestibular symptoms:  nausea  Type of dizziness: Spinning/Vertigo and Unsteady with head/body turns  Frequency: constant   Duration: constant  (can go away some with eyes closed, but still feels like there is movement)  Aggravating factors:  bending over, turning  Relieving factors: closing eyes  Progression of symptoms: unchanged  OCULOMOTOR EXAM:  Ocular Alignment: abnormal and L eye hypertropia   (either acute peripheral or CNS origin)  Ocular ROM: No Limitations  Spontaneous Nystagmus: absent  Gaze-Induced Nystagmus: absent  Smooth Pursuits:  double vision (when looking to the L  side)  Saccades: intact   H.I.N.T.S:  Head impulse:  + head impulse test to the R side indicating  Nystagmus: No evidence of nystagmus with gaze Test of Skew: WNLs  VESTIBULAR - OCULAR REFLEX:   Slow VOR: Comment: able to maintain gaze, but provokes 8/10 sensation of spinning  VOR Cancellation: Normal  Head-Impulse Test: HIT Right: positive for refixation saccade (this is reassuring that it is not CNS) HIT Left: negative    POSITIONAL TESTING: Right Dix-Hallpike: no nystagmus; gets dizziness coming up from the right side Left Dix-Hallpike: no nystagmus Right Roll Test: no nystagmus Left Roll Test: no nystagmus  FUNCTIONAL GAIT: Functional gait assessment: TBA, gait speed=TBA  [facility] Adult PT Treatment:                                                DATE: 06/20/23 Neuromuscular re-ed: Seated gaze x 1 horizontal and vertical x 10 reps with cues  Seated habituation horizontal and vertical plane-- brings on mild increase in symptoms (staying around baseline of 7/10)   PATIENT EDUCATION: Education details: HEP, nature of condition, red flag signs to seek immediate care (educated patient that if neurological signs appear-- facial droop, difficulty waling, sensory changes on one side of the body, worsening HA-- seek care due to current presentation) Person educated: Patient Education method: Explanation, Demonstration, and Handouts Education comprehension: verbalized understanding and returned demonstration  HOME EXERCISE PROGRAM: Access Code: [email or web address] URL: [email or web address] Date: 06/20/2023 Prepared by: Margretta Ditty  Exercises - Seated Gaze Stabilization with Head Rotation  - 2 x daily - 7 x weekly - 2 sets - 5-10 reps - Seated Gaze Stabilization with Head Nod  - 2 x daily - 7 x weekly - 2 sets - 5-10 reps - Seated habituation head turns  - 2 x daily - 7 x weekly - 1 sets - 5 reps - Seated Head Nods Vestibular Habituation  - 2 x daily - 7 x weekly - 1 sets - 5  reps  GOALS: Goals reviewed with patient? Yes  SHORT TERM GOALS: Target date: 07/21/23  The patient will be indep with HEP. Baseline: initiated at eval Goal status: INITIAL  2.  The patient will report baseline dizziness 4/10. Baseline:  7/10 Goal status: INITIAL  3.  The patient will tolerate gaze x 1 viewing x 30 seconds with change from baseline level of dizziness < 2/10. Baseline:  unable to tolerate. Goal status: INITIAL  4.  The patient will be further assessed on FGA and LTG to follow. Baseline:  TBA Goal status: INITIAL   LONG TERM GOALS: Target date: 08/19/23  The patient will be indep with HEP progression. Baseline:  initiated at eval Goal status: INITIAL  2.  The patient will report no dizziness at baseline. Baseline:  7/10 Goal status: INITIAL  3.  The patinet will improve FGA by 5 points from baseline. Baseline:  TBA Goal status: INITIAL  4.  The patient will tolerate gaze x 1 viewing x 60 seconds. Baseline:  Unable to tolerate >10 reps Goal status: INITIAL  ASSESSMENT:  CLINICAL IMPRESSION: Patient is a 67 y.o. female who was seen today for physical therapy evaluation and treatment for acute vestibular syndrome. She presents with impairments in oculomotor position (L eye hypertropia), double vision with L gaze, + R head impulse testing, slowed gait, and motion sensitivity. PT to further assess balance and progress with HEP. Due to red flag items (double vision, facial numbness, and headache), PT performed H.I.N.T.S. exam, which was reassuring -- indicative of R hypofunction with no CNS HINTS signs present. PT did educate the patient on symptoms that would warrant immediate medical attention. Plan to progress to patient tolerance.   OBJECTIVE IMPAIRMENTS: Abnormal gait, decreased activity tolerance, decreased balance, dizziness, and impaired vision/preception.   ACTIVITY LIMITATIONS: bending, squatting, and locomotion level  PARTICIPATION LIMITATIONS:  meal prep, cleaning, driving, and community activity  PERSONAL FACTORS: 3+ comorbidities: cirrhosis, back surgeries, osteoporosis  are also affecting patient's functional outcome.   REHAB POTENTIAL: Good  CLINICAL DECISION MAKING: Stable/uncomplicated  EVALUATION COMPLEXITY: Low   PLAN:  PT FREQUENCY: 1-2x/week  PT DURATION: 8 weeks  PLANNED INTERVENTIONS: Therapeutic exercises, Therapeutic activity, Neuromuscular re-education, Balance training, Gait training, Patient/Family education, Self Care, Stair training, Vestibular training, Canalith repositioning, and Manual therapy  PLAN FOR NEXT SESSION: Plan to perform FGA, gait speed, recheck HEP and progress to tolerance, multi-sensory balance, habituation.   Shatyra Becka, PT 06/20/2023, 12:23 PM

## 2023-06-21 ENCOUNTER — Telehealth: Payer: Self-pay | Admitting: Family Medicine

## 2023-06-21 NOTE — Telephone Encounter (Signed)
Pt called. "No evidence of intracranial bleed or mass, no change in plan for now" pt needs these results explained to her.

## 2023-07-02 ENCOUNTER — Encounter: Payer: Self-pay | Admitting: Family Medicine

## 2023-07-02 ENCOUNTER — Ambulatory Visit (INDEPENDENT_AMBULATORY_CARE_PROVIDER_SITE_OTHER): Payer: Medicare Other | Admitting: Family Medicine

## 2023-07-02 ENCOUNTER — Ambulatory Visit (INDEPENDENT_AMBULATORY_CARE_PROVIDER_SITE_OTHER): Payer: Medicare Other

## 2023-07-02 VITALS — BP 122/76 | HR 66 | Ht 65.0 in | Wt 180.0 lb

## 2023-07-02 DIAGNOSIS — H8110 Benign paroxysmal vertigo, unspecified ear: Secondary | ICD-10-CM | POA: Diagnosis not present

## 2023-07-02 DIAGNOSIS — S6992XA Unspecified injury of left wrist, hand and finger(s), initial encounter: Secondary | ICD-10-CM

## 2023-07-02 DIAGNOSIS — R030 Elevated blood-pressure reading, without diagnosis of hypertension: Secondary | ICD-10-CM

## 2023-07-02 DIAGNOSIS — Z23 Encounter for immunization: Secondary | ICD-10-CM | POA: Diagnosis not present

## 2023-07-02 LAB — POCT UA - MICROALBUMIN
Albumin/Creatinine Ratio, Urine, POC: <30
Creatinine, POC: 100 mg/dL
Microalbumin Ur, POC: 30 mg/L

## 2023-07-02 NOTE — Progress Notes (Signed)
Established Patient Office Visit  Subjective   Patient ID: Theresa Scott, female    DOB: [birth year]  Age: 67 y.o. MRN: [id number]  Chief Complaint  Patient presents with   Follow-up         HPI  She was seen by one of my partners Dr. Rodney Langton on August 26 for vertigo.  She was experiencing vertigo sudden times along with headache, tiredness sore throat.  She then developed nausea and vomiting without diarrhea.  She felt a little confused so he recommended stat CT of the head and treated her with steroids Valium and Zofran and sent to vestibular rehab.  He did go to 1 session of vestibular rehab on August 28.  Head CT was performed on August 26 and was negative.  Just showed some stable chronic small vessel ischemic change.  She also underwent a look at her left fifth finger.  One of the kids had caught the fishing hook in a tree and so she was trying to pull it it let go and the vomiting came back and smacked her on her left fifth finger.  Along the PIP joint.  She says it is really painful it was throbbing last night she had to take pain medication and try to keep it elevated.  She says it feels very much like when she fractured her wrist.    ROS    Objective:     BP 122/76   Pulse 66   Ht 5\' 5"  (1.651 m)   Wt 180 lb (81.6 kg)   SpO2 100%   BMI 29.95 kg/m    Physical Exam Vitals reviewed.  Constitutional:      Appearance: Normal appearance.  HENT:     Head: Normocephalic.  Pulmonary:     Effort: Pulmonary effort is normal.  Musculoskeletal:     Comments: With finger on left hand with some swelling and bruising over the PIP joint tender over the proximal part of the finger between the MCP and the PIP.  Normal flexion.  Neurological:     Mental Status: She is alert and oriented to person, place, and time.  Psychiatric:        Mood and Affect: Mood normal.        Behavior: Behavior normal.      No results found for any visits on 07/02/23.    The  ASCVD Risk score (Arnett DK, et al., 2019) failed to calculate for the following reasons:   The valid total cholesterol range is 130 to 320 mg/dL    Assessment & Plan:   Problem List Items Addressed This Visit   None Visit Diagnoses     Finger injury, left, initial encounter    -  Primary   Relevant Orders   DG Finger Little Left (Completed)   Benign paroxysmal positional vertigo, unspecified laterality       Elevated BP without diagnosis of hypertension          Vertigo  -  much improved.  She feels like she is almost back to baseline.  We did discuss that she can always go back to vestibular rehab if needed.  Finger injury, left pinkie - will get plain films today.  I did not see any fracture we will tape with splint today for support and comfort recommend icing frequently.  If not improving let us know.  We will have the radiologist over read the x-ray as well.  BBPV -Dems are much improved.  Was  able to attend 1 session of vestibular rehab.  Encouraged her to call and get in again if she feels like she needs to.  No follow-ups on file.    Nani Gasser, MD

## 2023-07-02 NOTE — Addendum Note (Signed)
Addended by: Deno Etienne on: 07/02/2023 01:57 PM   Modules accepted: Orders

## 2023-07-02 NOTE — Progress Notes (Signed)
Call patient and let her know that the overread by radiologist was also negative.

## 2023-07-03 NOTE — Therapy (Deleted)
OUTPATIENT PHYSICAL THERAPY VESTIBULAR TREATMENT   Patient Name: Theresa Scott MRN: [id number] DOB:07-13-1956, 67 y.o., female Today's Date: 07/03/2023  END OF SESSION:   Past Medical History:  Diagnosis Date   Allergy    Anxiety    Cancer (HCC) 08-22-2022   Just found out I have cirrhosis   Cataract 2019   Chronic kidney disease    Complication of anesthesia    hard to wake up and swelling after kidney stone removal in 1980 per pt   Degenerative disc disease    Depression    Diabetes mellitus    Fatty liver disease, nonalcoholic    Gallstones    GERD (gastroesophageal reflux disease)    Hypercholesterolemia    Hypertension    Kidney stones    Liver cirrhosis secondary to NASH (HCC)    Nocturnal hypoxemia 05/23/2023   Obesity    Osteoporosis    Pancreatitis    SOB (shortness of breath)    Swelling of both lower extremities    Past Surgical History:  Procedure Laterality Date   BACK SURGERY  1990,2003,2004,2010   BREAST SURGERY     CARDIAC CATHETERIZATION  2011   [facility].    CARDIAC SURGERY  2002   ablation   CHOLECYSTECTOMY  1976   COLONOSCOPY  2008   ESOPHAGOGASTRODUODENOSCOPY N/A 01/27/2013   Procedure: ESOPHAGOGASTRODUODENOSCOPY (EGD) upper endoscopy;  Surgeon: Kandis Cocking, MD;  Location: [facility];  Service: General;  Laterality: N/A;   EYE SURGERY     GASTRIC BYPASS  08/22/2010   HAND SURGERY  2000   right   SPINE SURGERY  1990,2001,2004,2005,2015   SUBTHALAMIC STIMULATOR BATTERY REPLACEMENT  08/16/2020   TEE WITHOUT CARDIOVERSION N/A 03/07/2023   Procedure: TRANSESOPHAGEAL ECHOCARDIOGRAM;  Surgeon: Maisie Fus, MD;  Location: [facility];  Service: Cardiovascular;  Laterality: N/A;   TONSILLECTOMY  1971   Patient Active Problem List   Diagnosis Date Noted   Vertigo 06/18/2023   Nocturnal hypoxemia 05/23/2023   Fatigue 10/30/2022   Epistaxis 10/30/2022   Liver cirrhosis secondary to NASH (HCC) 10/30/2022   Major depressive  disorder, single episode, severe without psychotic features (HCC) 11/03/2019   Osteoporosis 06/05/2019   NAFLD (nonalcoholic fatty liver disease) 21/30/8657   Liver fibrosis (HCC) - suspected 12/04/2016   Aortic calcification (HCC) 04/20/2016   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 11/03/2011   IMPAIRED FASTING GLUCOSE 12/15/2010   ANXIETY 01/31/2010   PSVT 12/01/2009   STASIS DERMATITIS 04/06/2009   ALLERGIC RHINITIS 08/19/2008   Hyperlipemia 06/18/2008   OBESITY 06/18/2008   Peripheral vascular disease, unspecified (HCC) 06/18/2008   GERD 06/18/2008   OSTEOARTHRITIS 06/18/2008   INCONTINENCE 06/18/2008   PROTEINURIA 06/18/2008   NEPHROLITHIASIS, HX OF 06/18/2008    PCP: Monica Becton, MD REFERRING PROVIDER: Monica Becton, MD REFERRING DIAG: R42 (ICD-10-CM) - Vertigo  THERAPY DIAG:  No diagnosis found.  ONSET DATE: 06/16/23  Rationale for Evaluation and Treatment: Rehabilitation  SUBJECTIVE:   SUBJECTIVE STATEMENT: *** EVAL:The patient had a sudden onset of dizziness 4 days ago described as bad HA, room spinning, nausea. This same sensation occurred a couple of weeks ago and stopped after minutes. She is continuing to have nausea, fatigue, imbalance and a spinning sensation. She notes a h/o intermittent vertigo.  She also reports sleep testing indicating her O2 level is dropping at night into the 80s. She  underwent a CT scan of the brain-no acute abnormalities. She also reports double vision and visual blurriness.  Pt accompanied by: self  PERTINENT HISTORY: h/o 7 back surgeries (has a spinal stimulator), liver cirrhosis, OA, osteoporosis.  PAIN:  Are you having pain? No  PRECAUTIONS: Fall  RED FLAGS: *sudden onset of vertigo with HA-- has had a CT scan  WEIGHT BEARING RESTRICTIONS: No  FALLS: Has patient fallen in last 6  months? No  LIVING ENVIRONMENT: Lives with: lives with their family has 3 adopted children and husband at home Lives in: House/apartment Stairs: Yes: External: 2 steps;    PLOF: Independent  PATIENT GOALS: "Where the dizziness will stop."  OBJECTIVE:  (Measures in this section from initial evaluation unless otherwise noted) DIAGNOSTIC FINDINGS: CT IMPRESSION: 1. Stable chronic small-vessel ischemic changes as above. No acute intracranial process.  Baseline Dizziness: Rates 7/10  SENSATION: Notes L side facial numbness since dizziness started.   Cervical ROM:  WFLs  GAIT: Comments: slowed speed with gait-- to be further assessed  PATIENT SURVEYS:  N/a-- due to vertigo dx  VESTIBULAR ASSESSMENT:  Vitals: 129/76, HR=72 GENERAL OBSERVATION: Patient moves slowly walking into PT clinic independently without a device.   SYMPTOM BEHAVIOR:  Subjective history: The patient reports sudden onset of vertigo approximately 4 days ago.   Non-Vestibular symptoms:  nausea  Type of dizziness: Spinning/Vertigo and Unsteady with head/body turns  Frequency: constant   Duration: constant  (can go away some with eyes closed, but still feels like there is movement)  Aggravating factors:  bending over, turning  Relieving factors: closing eyes  Progression of symptoms: unchanged  OCULOMOTOR EXAM:  Ocular Alignment: abnormal and L eye hypertropia   (either acute peripheral or CNS origin)  Ocular ROM: No Limitations  Spontaneous Nystagmus: absent  Gaze-Induced Nystagmus: absent  Smooth Pursuits:  double vision (when looking to the L side)  Saccades: intact   H.I.N.T.S:  Head impulse:  + head impulse test to the R side indicating  Nystagmus: No evidence of nystagmus with gaze Test of Skew: WNLs  VESTIBULAR - OCULAR REFLEX:   Slow VOR: Comment: able to maintain gaze, but provokes 8/10 sensation of spinning  VOR Cancellation: Normal  Head-Impulse Test: HIT Right: positive for refixation  saccade (this is reassuring that it is not CNS) HIT Left: negative    POSITIONAL TESTING: Right Dix-Hallpike: no nystagmus; gets dizziness coming up from the right side Left Dix-Hallpike: no nystagmus Right Roll Test: no nystagmus Left Roll Test: no nystagmus  FUNCTIONAL GAIT: Functional gait assessment: TBA, gait speed=TBA  [facility] Adult PT Treatment:                                                DATE: 06/20/23 Neuromuscular re-ed: Seated gaze x 1 horizontal and vertical x 10 reps with cues Seated habituation horizontal and vertical plane-- brings on mild increase in symptoms (staying around baseline of 7/10)   PATIENT EDUCATION: Education details: HEP, nature of condition, red flag signs to seek immediate care (educated patient that if neurological signs appear-- facial droop, difficulty waling, sensory changes on one side of the body, worsening HA-- seek care due to current presentation) Person educated: Patient Education method: Explanation, Demonstration, and Handouts Education comprehension: verbalized understanding and returned demonstration  HOME EXERCISE PROGRAM: Access Code: [email or web address] URL: [email or web address] Date: 06/20/2023 Prepared by: Margretta Ditty  Exercises - Seated Gaze Stabilization with Head Rotation  - 2 x daily - 7 x weekly - 2 sets - 5-10 reps - Seated Gaze Stabilization with Head Nod  - 2 x daily - 7 x weekly - 2 sets - 5-10 reps - Seated habituation head turns  - 2 x daily - 7 x weekly - 1 sets - 5 reps - Seated Head Nods Vestibular Habituation  - 2 x daily - 7 x weekly - 1 sets - 5 reps  GOALS: Goals reviewed with patient? Yes  SHORT TERM GOALS: Target date: 07/21/23  The patient will be indep with HEP. Baseline: initiated at eval Goal status: INITIAL  2.  The patient will report baseline dizziness 4/10. Baseline:  7/10 Goal status: INITIAL  3.  The patient will tolerate gaze x 1 viewing x 30 seconds with change from baseline level  of dizziness < 2/10. Baseline:  unable to tolerate. Goal status: INITIAL  4.  The patient will be further assessed on FGA and LTG to follow. Baseline:  TBA Goal status: INITIAL   LONG TERM GOALS: Target date: 08/19/23  The patient will be indep with HEP progression. Baseline:  initiated at eval Goal status: INITIAL  2.  The patient will report no dizziness at baseline. Baseline:  7/10 Goal status: INITIAL  3.  The patinet will improve FGA by 5 points from baseline. Baseline:  TBA Goal status: INITIAL  4.  The patient will tolerate gaze x 1 viewing x 60 seconds. Baseline:  Unable to tolerate >10 reps Goal status: INITIAL  ASSESSMENT:  CLINICAL IMPRESSION: Patient is a 67 y.o. female who was seen today for physical therapy evaluation and treatment for acute vestibular syndrome. She presents with impairments in oculomotor position (L eye hypertropia), double vision with L gaze, + R head impulse testing, slowed gait, and motion sensitivity. PT to further assess balance and progress with HEP. Due to red flag items (double vision, facial numbness, and headache), PT performed H.I.N.T.S. exam, which was reassuring -- indicative of R hypofunction with no CNS HINTS signs present. PT did educate the patient on symptoms that would warrant immediate medical attention. Plan to progress to patient tolerance.   OBJECTIVE IMPAIRMENTS: Abnormal gait, decreased activity tolerance, decreased balance, dizziness, and impaired vision/preception.    PLAN:  PT FREQUENCY: 1-2x/week  PT DURATION: 8 weeks  PLANNED INTERVENTIONS: Therapeutic exercises, Therapeutic activity, Neuromuscular re-education, Balance training, Gait training, Patient/Family education, Self Care, Stair training, Vestibular training, Canalith repositioning, and Manual therapy  PLAN FOR NEXT SESSION: Plan to perform FGA, gait speed, recheck HEP and progress to tolerance, multi-sensory balance, habituation.   Raihan Kimmel,  PT 07/03/2023, 6:42 PM

## 2023-07-04 ENCOUNTER — Ambulatory Visit: Payer: Medicare Other | Admitting: Rehabilitative and Restorative Service Providers"

## 2023-07-04 DIAGNOSIS — M4326 Fusion of spine, lumbar region: Secondary | ICD-10-CM | POA: Diagnosis not present

## 2023-07-04 DIAGNOSIS — G894 Chronic pain syndrome: Secondary | ICD-10-CM | POA: Diagnosis not present

## 2023-07-04 DIAGNOSIS — M791 Myalgia, unspecified site: Secondary | ICD-10-CM | POA: Diagnosis not present

## 2023-07-11 ENCOUNTER — Encounter: Payer: PRIVATE HEALTH INSURANCE | Admitting: Rehabilitative and Restorative Service Providers"

## 2023-07-17 ENCOUNTER — Other Ambulatory Visit: Payer: Self-pay | Admitting: Family Medicine

## 2023-07-18 ENCOUNTER — Encounter: Payer: PRIVATE HEALTH INSURANCE | Admitting: Rehabilitative and Restorative Service Providers"

## 2023-07-23 ENCOUNTER — Ambulatory Visit (INDEPENDENT_AMBULATORY_CARE_PROVIDER_SITE_OTHER): Payer: Medicare Other | Admitting: Internal Medicine

## 2023-07-23 ENCOUNTER — Encounter: Payer: Self-pay | Admitting: Internal Medicine

## 2023-07-23 ENCOUNTER — Other Ambulatory Visit (INDEPENDENT_AMBULATORY_CARE_PROVIDER_SITE_OTHER): Payer: Medicare Other

## 2023-07-23 VITALS — BP 120/70 | HR 59 | Ht 65.0 in | Wt 181.0 lb

## 2023-07-23 DIAGNOSIS — K7581 Nonalcoholic steatohepatitis (NASH): Secondary | ICD-10-CM | POA: Diagnosis not present

## 2023-07-23 DIAGNOSIS — R600 Localized edema: Secondary | ICD-10-CM

## 2023-07-23 DIAGNOSIS — K7460 Unspecified cirrhosis of liver: Secondary | ICD-10-CM

## 2023-07-23 DIAGNOSIS — R400 Somnolence: Secondary | ICD-10-CM

## 2023-07-23 LAB — PROTIME-INR
INR: 1.1 {ratio} — ABNORMAL HIGH (ref 0.8–1.0)
Prothrombin Time: 11.8 s (ref 9.6–13.1)

## 2023-07-23 NOTE — Progress Notes (Signed)
Theresa Scott 67 y.o. 1956/04/14 [id number]  Assessment & Plan:   Encounter Diagnoses  Name Primary?   Liver cirrhosis secondary to NASH (HCC) Yes   Daytime sleepiness    Peripheral edema        Cirrhosis most recently has been Theresa Scott A.  Await additional labs to recalculate. Stable with no signs of decompensation. Child-Pugh score A. No asterixis noted. Mild forgetfulness reported, but ammonia level previously low. -Order ultrasound and alpha-fetoprotein to screen for hepatocellular carcinoma. -Check INR.  Sleep disturbance is not new and do not think she has significant hepatic encephalopathy.  She will probably be going on home oxygen and future procedures will need to be at the hospital.    Orders Placed This Encounter  Procedures   US Abdomen Limited RUQ (LIVER/GB)   Protime-INR   AFP tumor marker   Timing of follow-up pending the above.  Unless there are other issues would likely plan in person follow-up in 1 year and labs/ultrasound in 6 months.   Subjective:    GI summary:   07/04/2016 EGD with status post Roux-en-Y gastrojejunostomy.  All healthy appearing.    12/20/2017 colonoscopy with 2 polyps removed from the sigmoid and ascending colon diverticulosis in the right colon.  Polyps were hyperplastic and repeat recommended 10 years.    07/30/2018 patient seen in clinic by Dr. Leone Payor for her nonalcoholic fatty liver disease.  At that time she had done all she could not respect her liver fibrosis her fibrosis score was calculated to F2-F3.  Recommend she follow-up in a year to recheck LFTs about every 6 months.  At that point did not have cirrhosis.    07/12/2022 ultrasound elastography of the liver with greater than 13 KP A.  (In patients with nonalcoholic liver disease values suggest compensated advanced chronic liver disease).  Also coarse hepatic echotexture with nodular surface contour.    07/31/2022 AST 40, ALT 34.  CBC with platelets low at 122.  IgA normal,  ANA, ASMA, AMA all normal, iron studies normal.  PT/INR normal.  1.1/11.3. 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  History of Present Illness   The patient, with a history of cirrhosis, presents with forgetfulness and fatigue. She reports being 'off balance' and experiencing vertigo, but deny any falls. The forgetfulness is characterized by difficulty completing tasks and easily being distracted, such as forgetting to add coffee after adding water to the coffee maker. She also reports excessive daytime sleepiness and disrupted sleep schedule. She denies snoring. She has been evaluated for sleep apnea and was recommended to start oxygen therapy, but has not yet received it. She also reports depression and is on medication for it. She is not exercising regularly, but is active in caring for two young children. She also reports chronic leg swelling.         Allergies  Allergen Reactions   Bee Venom Anaphylaxis   Latex Rash   Current Meds  Medication Sig   Accu-Chek FastClix Lancets MISC Dx DM E11.9 Check fasting blood sugar every morning and after largest meal a few days a week.   Alcohol Swabs (B-D SINGLE USE SWABS REGULAR) PADS USE TOPICALLY  1  TO  2  TIMES  DAILY AS DIRECTED   alendronate (FOSAMAX) 70 MG tablet  TAKE 1 TABLET EVERY WEEK WITH A FULL GLASS OF WATER ON AN EMPTY STOMACH.   aspirin EC 81 MG tablet Take 1 tablet (81 mg total) by mouth daily. Swallow whole.   atorvastatin (LIPITOR) 20 MG tablet TAKE 1 TABLET EVERY DAY   baclofen (LIORESAL) 10 MG tablet Take 10 mg by mouth daily.   buPROPion (WELLBUTRIN XL) 150 MG 24 hr tablet TAKE 1 TABLET EVERY DAY   cholecalciferol (VITAMIN D3) 25 MCG (1000 UNIT) tablet Take 1,000 Units by mouth daily.   desvenlafaxine (PRISTIQ) 50 MG 24  hr tablet Take 1 tablet (50 mg total) by mouth daily.   docusate sodium (COLACE) 100 MG capsule Take 100 mg by mouth daily.   empagliflozin (JARDIANCE) 10 MG TABS tablet Take 1 tablet (10 mg total) by mouth daily before breakfast.   ferrous sulfate 325 (65 FE) MG EC tablet Take 325 mg by mouth daily.   gabapentin (NEURONTIN) 300 MG capsule TAKE 2 CAPSULES EVERY DAY   glucose blood (ACCU-CHEK SMARTVIEW) test strip Dx DM E11.9 Check fasting blood sugar every morning and after largest meal a few days a week.   losartan (COZAAR) 25 MG tablet Take 1 tablet (25 mg total) by mouth daily.   metFORMIN (GLUCOPHAGE-XR) 500 MG 24 hr tablet TAKE 1 TABLET EVERY DAY WITH BREAKFAST   omeprazole (PRILOSEC) 40 MG capsule TAKE 1 CAPSULE EVERY DAY   ondansetron (ZOFRAN-ODT) 8 MG disintegrating tablet Take 1 tablet (8 mg total) by mouth every 8 (eight) hours as needed for nausea.   Oxycodone HCl 10 MG TABS Take 10 mg by mouth 3 (three) times daily as needed (pain).   triamcinolone cream (KENALOG) 0.1 % Apply 1 Application topically daily as needed.   Past Medical History:  Diagnosis Date   Allergy    Anxiety    Cancer (HCC) 08-22-2022   Just found out I have cirrhosis   Cataract 2019   Chronic kidney disease    Complication of anesthesia    hard to wake up and swelling after kidney stone removal in 1980 per pt   Degenerative disc disease    Depression    Diabetes mellitus    Fatty liver disease, nonalcoholic    Gallstones    GERD (gastroesophageal reflux disease)    Hypercholesterolemia    Hypertension    Kidney stones    Liver cirrhosis secondary to NASH (HCC)    Nocturnal hypoxemia 05/23/2023   Obesity    Osteoporosis    Pancreatitis    SOB (shortness of breath)    Swelling of both lower extremities    Past Surgical History:  Procedure Laterality Date   BACK SURGERY  1990,2003,2004,2010   BREAST SURGERY     CARDIAC CATHETERIZATION  2011   [facility].    CARDIAC SURGERY  2002   ablation    CHOLECYSTECTOMY  1976   COLONOSCOPY  2008   ESOPHAGOGASTRODUODENOSCOPY N/A 01/27/2013   Procedure: ESOPHAGOGASTRODUODENOSCOPY (EGD) upper endoscopy;  Surgeon: Kandis Cocking, MD;  Location: [facility];  Service: General;  Laterality: N/A;   EYE SURGERY     GASTRIC BYPASS  08/22/2010   HAND SURGERY  2000   right   SPINE SURGERY  1990,2001,2004,2005,2015   SUBTHALAMIC STIMULATOR BATTERY REPLACEMENT  08/16/2020   TEE WITHOUT CARDIOVERSION N/A 03/07/2023   Procedure: TRANSESOPHAGEAL ECHOCARDIOGRAM;  Surgeon: Maisie Fus, MD;  Location: [facility];  Service: Cardiovascular;  Laterality: N/A;   TONSILLECTOMY  1971   Social History  Social History Narrative   Married 1 daughter (67) and 3 adopted children (5, 3 months)   Disabled, chronic back pain, 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   She enjoys crocheting       family history includes Addison's disease in her father; Alcohol abuse in her brother, brother, daughter, and mother; COPD in her brother, mother, and sister; Cancer in her brother, brother, mother, and sister; Cirrhosis in her sister; Colon cancer in her brother; Diabetes in her sister and sister; Heart attack in her sister; Kidney failure in her brother; Liver cancer in her brother; Lung cancer in her mother and sister; Miscarriages / [place] in her mother.   Review of Systems As perHPI  Objective:   Physical Exam @BP  120/70   Pulse (!) 59   Ht 5\' 5"  (1.651 m)   Wt 181 lb (82.1 kg)   BMI 30.12 kg/m @  General:  NAD Eyes:   anicteric Lungs:  clear Heart::  S1S2 no rubs, murmurs or gallops Abdomen:  soft and nontender, BS+, no HSM/mass Ext:   trace edema, cyanosis or clubbing    Data Reviewed:  See HPI/summary

## 2023-07-23 NOTE — Patient Instructions (Signed)
Your provider has requested that you go to the basement level for lab work before leaving today. Press "B" on the elevator. The lab is located at the first door on the left as you exit the elevator.  Due to recent changes in healthcare laws, you may see the results of your imaging and laboratory studies on MyChart before your provider has had a chance to review them.  We understand that in some cases there may be results that are confusing or concerning to you. Not all laboratory results come back in the same time frame and the provider may be waiting for multiple results in order to interpret others.  Please give [place] 48 hours in order for your provider to thoroughly review all the results before contacting the office for clarification of your results.   You have been scheduled for an abdominal ultrasound at [facility] (1st floor of hospital) on 07/26/2023 at 9:30am. Please arrive 15 minutes prior to your appointment for registration. Make certain not to have anything to eat or drink 6 hours prior to your appointment. Should you need to reschedule your appointment, please contact radiology at [phone]. This test typically takes about 30 minutes to perform.   I appreciate the opportunity to care for you. Stan Head, MD, [facility]

## 2023-07-25 LAB — AFP TUMOR MARKER: AFP-Tumor Marker: 5.1 ng/mL

## 2023-07-26 ENCOUNTER — Ambulatory Visit (HOSPITAL_COMMUNITY)
Admission: RE | Admit: 2023-07-26 | Discharge: 2023-07-26 | Disposition: A | Discharge status: Home / Self Care | Payer: Medicare Other | Source: Ambulatory Visit | Attending: Internal Medicine | Admitting: Internal Medicine

## 2023-07-26 DIAGNOSIS — Z9049 Acquired absence of other specified parts of digestive tract: Secondary | ICD-10-CM | POA: Diagnosis not present

## 2023-07-26 DIAGNOSIS — K7581 Nonalcoholic steatohepatitis (NASH): Secondary | ICD-10-CM | POA: Insufficient documentation

## 2023-07-26 DIAGNOSIS — K7460 Unspecified cirrhosis of liver: Secondary | ICD-10-CM | POA: Diagnosis not present

## 2023-07-26 DIAGNOSIS — K7689 Other specified diseases of liver: Secondary | ICD-10-CM | POA: Diagnosis not present

## 2023-08-17 ENCOUNTER — Other Ambulatory Visit: Payer: Self-pay | Admitting: Family Medicine

## 2023-08-17 MED ORDER — CARVEDILOL 3.125 MG PO TABS: 3.1250 mg | ORAL_TABLET | Freq: Two times a day (BID) | ORAL | 3 refills | Status: DC

## 2023-08-28 ENCOUNTER — Telehealth: Payer: Self-pay

## 2023-08-28 ENCOUNTER — Telehealth: Payer: Self-pay | Admitting: Internal Medicine

## 2023-08-28 NOTE — Telephone Encounter (Signed)
Pt's [employer] assistance application was scanned to [employer]. FYI

## 2023-08-28 NOTE — Telephone Encounter (Signed)
Paper Work Dropped Off: [employer] - Patient Assistance   Date: 08/28/2023  Location of paper:  in pink folder labeled Lynden Ang at front office - copy in [employer]

## 2023-08-28 NOTE — Telephone Encounter (Addendum)
PAP: Application for [place] [employer] has been submitted to PAP Companies: [employer], via fax If update is requested in the meantime, please refer to [facility] at [phone]. *Please note [employer] is currently 2-3 weeks behind on processing applications.

## 2023-08-29 ENCOUNTER — Other Ambulatory Visit (HOSPITAL_COMMUNITY): Payer: Self-pay

## 2023-08-30 ENCOUNTER — Telehealth: Payer: Self-pay | Admitting: Family Medicine

## 2023-08-30 DIAGNOSIS — M791 Myalgia, unspecified site: Secondary | ICD-10-CM | POA: Diagnosis not present

## 2023-08-30 DIAGNOSIS — Z79899 Other long term (current) drug therapy: Secondary | ICD-10-CM | POA: Diagnosis not present

## 2023-08-30 DIAGNOSIS — M4326 Fusion of spine, lumbar region: Secondary | ICD-10-CM | POA: Diagnosis not present

## 2023-08-30 DIAGNOSIS — G894 Chronic pain syndrome: Secondary | ICD-10-CM | POA: Diagnosis not present

## 2023-08-30 DIAGNOSIS — Z79891 Long term (current) use of opiate analgesic: Secondary | ICD-10-CM | POA: Diagnosis not present

## 2023-08-30 NOTE — Telephone Encounter (Signed)
This is the original note from January when we first sent in the prescription.  But when I saw her in September we decided to write for a lower strength.  "Please call pt and let her know I am changing her metoprolol to carvedilol as per Dr. Marvell Fuller recommendatin for her liver and BP. Sent to [facility] "       Meds ordered this encounter  Medications   carvedilol (COREG) 6.25 MG tablet      Sig: Take 1 tablet (6.25 mg total) by mouth 2 (two) times daily with a meal.      Dispense:  180 tablet      Refill:  3

## 2023-08-30 NOTE — Telephone Encounter (Signed)
Patient called she received a medication Carvedilol 3.125mg  she says that she doesn't remember Dr. Linford Arnold added for her please contact patient and go over this with her she seems confused  Patient's phone number [phone]

## 2023-08-31 NOTE — Telephone Encounter (Signed)
Ok, removed from med list

## 2023-08-31 NOTE — Telephone Encounter (Signed)
Theresa Scott did confirm with cardiology that she was to discontinue Coreg.

## 2023-08-31 NOTE — Telephone Encounter (Signed)
She is going to call cardiology to find out if they wanted her to stay on Coreg. She will call us back.

## 2023-08-31 NOTE — Addendum Note (Signed)
Addended by: Nani Gasser D on: 08/31/2023 05:58 PM   Modules accepted: Orders

## 2023-09-10 ENCOUNTER — Other Ambulatory Visit: Payer: Self-pay | Admitting: Family Medicine

## 2023-09-10 DIAGNOSIS — F322 Major depressive disorder, single episode, severe without psychotic features: Secondary | ICD-10-CM

## 2023-09-10 NOTE — Telephone Encounter (Signed)
Call pt: please schedule a f/u in Jan.

## 2023-09-11 NOTE — Telephone Encounter (Signed)
Called spoke with patient she is scheduled in January she wanted to also say that her o2 has still not been ordered

## 2023-09-18 ENCOUNTER — Other Ambulatory Visit: Payer: Self-pay | Admitting: Internal Medicine

## 2023-09-18 DIAGNOSIS — R002 Palpitations: Secondary | ICD-10-CM

## 2023-09-18 DIAGNOSIS — R400 Somnolence: Secondary | ICD-10-CM

## 2023-09-18 DIAGNOSIS — E1169 Type 2 diabetes mellitus with other specified complication: Secondary | ICD-10-CM

## 2023-09-18 DIAGNOSIS — K7460 Unspecified cirrhosis of liver: Secondary | ICD-10-CM

## 2023-09-18 DIAGNOSIS — I340 Nonrheumatic mitral (valve) insufficiency: Secondary | ICD-10-CM

## 2023-09-18 DIAGNOSIS — E119 Type 2 diabetes mellitus without complications: Secondary | ICD-10-CM

## 2023-09-18 DIAGNOSIS — I152 Hypertension secondary to endocrine disorders: Secondary | ICD-10-CM

## 2023-09-18 DIAGNOSIS — R072 Precordial pain: Secondary | ICD-10-CM

## 2023-09-27 NOTE — Progress Notes (Unsigned)
Cardiology Office Note:   Date:  09/28/2023  ID:  Theresa Scott, DOB [birth year], MRN [id number] PCP:  Agapito Games, MD  [facility] Providers Cardiologist:  Alverda Skeans, MD Referring MD: Agapito Games, *  Chief Complaint/Reason for Referral: Cardiology follow-up ASSESSMENT:    1. Nonrheumatic mitral valve regurgitation   2. Type 2 diabetes mellitus with stage 2 chronic kidney disease, without long-term current use of insulin (HCC)   3. Hypertension associated with diabetes (HCC)   4. Hyperlipidemia associated with type 2 diabetes mellitus (HCC)   5. Aortic atherosclerosis (HCC)   6. Cirrhosis, nonalcoholic (HCC)   7. Coronary artery disease involving native coronary artery of native heart without angina pectoris   8. Obstructive sleep apnea syndrome   9. CKD (chronic kidney disease) stage 2, GFR 60-89 ml/min     PLAN:   In order of problems listed above: Mitral regurgitation: Mild on TEE; monitor. Type 2 diabetes mellitus: Continue aspirin, Lipitor, Jardiance, and losartan. Hypertension:*** Hyperlipidemia: Check lipid panel and LFTs today.*** Cath sclerosis: Continue aspirin, statin, and blood pressure control. Cirrhosis: Followed by other providers. Coronary artery disease: Mild; continue medical therapy. Obstructive sleep apnea: Appreciate Dr. Norris Cross recommendations; CPAP was recommended for the patient was reluctant to start. CKD stage II: Continue ARB and Jardiance.        {Are you ordering a CV Procedure (e.g. stress test, cath, DCCV, TEE, etc)?   Press F2        :[id number]}   Dispo:  No follow-ups on file.      Medication Adjustments/Labs and Tests Ordered: Current medicines are reviewed at length with the patient today.  Concerns regarding medicines are outlined above.  The following changes have been made:  {PLAN; NO CHANGE:13088:s}   Labs/tests ordered: No orders of the defined types were placed in this encounter.   Medication  Changes: No orders of the defined types were placed in this encounter.   Current medicines are reviewed at length with the patient today.  The patient {ACTIONS; HAS/DOES NOT HAVE:19233} concerns regarding medicines.  I spent *** minutes reviewing all clinical data during and prior to this visit including all relevant imaging studies, laboratories, clinical information from other health systems and prior notes from both Cardiology and other specialties, interviewing the patient, conducting a complete physical examination, and coordinating care in order to formulate a comprehensive and personalized evaluation and treatment plan.  History of Present Illness:      FOCUSED PROBLEM LIST:   Coronary artery disease Mild, coronary CTA 2024 Atrial fibrillation Status post ablation 2001 No longer on anticoagulation SVT, PACs, and PVCs monitor 2024 Nonalcoholic cirrhosis Followed by gastroenterology Type 2 diabetes mellitus Not on insulin Hyperlipidemia LP(a) 19.6 Aortic atherosclerosis CT lumbar spine 2024 Hypertension BMI 30 CKD stage II   November 2023: The patient is a 67 y.o. female with the indicated medical history here for lower extremity edema.  The patient was seen by her primary care provider and gastroenterology recently.  She is thought to have nonalcoholic cirrhosis.  She is noted to have increased lower extremity edema.  A BNP was drawn 6 weeks ago which was within normal limits.  She is referred for further recommendations.  She tells me over the last few months she has been, more fatigue with activities of daily living.  She has had some mild shortness of breath but no exertional angina.  She feels worn out by the afternoon.  She does snore and reports daytime somnolence.  She  was tested for sleep apnea sometime ago but this was negative.  She fortunately has not had any severe bleeding or bruising, signs or symptoms of stroke, or need for emergency room visits or hospitalization.   Plan: Obtain echocardiogram, start aspirin 81, losartan 12.5 mg, check lipid panel, LFTs, and LP(a), obtain sleep study, consider Jardiance in the future.   May 2024: The patient had a reassuring echocardiogram however the final read demonstrated moderate to severe mitral regurgitation.  I reviewed these echo images and is unclear exactly how much much regurgitation is present.  She is still quite fatigued almost all the time.  She does notice some shortness of breath with exertion but not routinely.  Her peripheral edema is somewhat better.  She really only notices it late in the day.  It seems to be better when she gets up in the morning.  She did have a sleep study done which showed only mild obstructive sleep apnea not enough to warrant CPAP or BiPAP.  Her PCP is considering a nocturnal oxygen study.  Interestingly she tells me she has developed some chest discomfort at times.  This can happen at rest and with exertion.  It does not happen on a routine basis.  She fortunately has not required any emergency room visits or hospitalizations.  Plan: TEE to assess mitral regurgitation, start Jardiance 10 mg daily, increase losartan to 25 mg, check lipids and LFTs, obtain coronary CTA, and obtain monitor due to fatigue.  December 2024: In the interim a TEE demonstrated no mitral regurgitation with preserved LV function with an ejection fraction of 60 to 65%.  The coronary CTA demonstrated mild nonobstructive coronary artery disease.  A monitor demonstrated no atrial fibrillation.  Her LDL was at goal.  She saw Dr. Mayford Knife and CPAP was recommended the patient wanted to defer this.          Current Medications: No outpatient medications have been marked as taking for the 10/02/23 encounter (Appointment) with Theresa Pyo, MD.     Review of Systems:   Please see the history of present illness.    All other systems reviewed and are negative.     EKGs/Labs/Other Test Reviewed:   EKG: EKG performed  March 2024 likely sinus rhythm  EKG Interpretation Date/Time:    Ventricular Rate:    PR Interval:    QRS Duration:    QT Interval:    QTC Calculation:   R Axis:      Text Interpretation:           Risk Assessment/Calculations:   {Does this patient have ATRIAL FIBRILLATION?:[id number]}      Physical Exam:   VS:  There were no vitals taken for this visit.   No BP recorded.  {Refresh Note OR Click here to enter BP  :1}***   Wt Readings from Last 3 Encounters:  07/23/23 181 lb (82.1 kg)  07/02/23 180 lb (81.6 kg)  03/07/23 187 lb (84.8 kg)      GENERAL:  No apparent distress, AOx3 HEENT:  No carotid bruits, +2 carotid impulses, no scleral icterus CAR: RRR Irregular RR*** no murmurs***, gallops, rubs, or thrills RES:  Clear to auscultation bilaterally ABD:  Soft, nontender, nondistended, positive bowel sounds x 4 VASC:  +2 radial pulses, +2 carotid pulses NEURO:  CN 2-12 grossly intact; motor and sensory grossly intact PSYCH:  No active depression or anxiety EXT:  No edema, ecchymosis, or cyanosis  Signed, Theresa Pyo, MD  09/28/2023 9:26 AM  [facility] [address] [place], [place], [place]  [address] Phone: [phone]; Fax: [phone]   Note:  This document was prepared using Dragon voice recognition software and may include unintentional dictation errors.

## 2023-10-02 ENCOUNTER — Encounter: Payer: Self-pay | Admitting: Internal Medicine

## 2023-10-02 ENCOUNTER — Ambulatory Visit: Payer: Medicare Other | Attending: Internal Medicine | Admitting: Internal Medicine

## 2023-10-02 VITALS — BP 115/65 | HR 64 | Ht 65.0 in | Wt 177.0 lb

## 2023-10-02 DIAGNOSIS — E1169 Type 2 diabetes mellitus with other specified complication: Secondary | ICD-10-CM | POA: Diagnosis not present

## 2023-10-02 DIAGNOSIS — G4733 Obstructive sleep apnea (adult) (pediatric): Secondary | ICD-10-CM | POA: Diagnosis not present

## 2023-10-02 DIAGNOSIS — I152 Hypertension secondary to endocrine disorders: Secondary | ICD-10-CM | POA: Diagnosis not present

## 2023-10-02 DIAGNOSIS — K7460 Unspecified cirrhosis of liver: Secondary | ICD-10-CM | POA: Insufficient documentation

## 2023-10-02 DIAGNOSIS — I340 Nonrheumatic mitral (valve) insufficiency: Secondary | ICD-10-CM | POA: Insufficient documentation

## 2023-10-02 DIAGNOSIS — N182 Chronic kidney disease, stage 2 (mild): Secondary | ICD-10-CM | POA: Insufficient documentation

## 2023-10-02 DIAGNOSIS — I700 Atherosclerosis of aorta: Secondary | ICD-10-CM | POA: Insufficient documentation

## 2023-10-02 DIAGNOSIS — E1122 Type 2 diabetes mellitus with diabetic chronic kidney disease: Secondary | ICD-10-CM | POA: Insufficient documentation

## 2023-10-02 DIAGNOSIS — E1159 Type 2 diabetes mellitus with other circulatory complications: Secondary | ICD-10-CM | POA: Insufficient documentation

## 2023-10-02 DIAGNOSIS — R079 Chest pain, unspecified: Secondary | ICD-10-CM | POA: Insufficient documentation

## 2023-10-02 DIAGNOSIS — E785 Hyperlipidemia, unspecified: Secondary | ICD-10-CM | POA: Diagnosis not present

## 2023-10-02 DIAGNOSIS — I2510 Atherosclerotic heart disease of native coronary artery without angina pectoris: Secondary | ICD-10-CM | POA: Insufficient documentation

## 2023-10-02 LAB — HEPATIC FUNCTION PANEL
ALT: 30 [IU]/L (ref 0–32)
AST: 38 [IU]/L (ref 0–40)
Albumin: 4.2 g/dL (ref 3.9–4.9)
Alkaline Phosphatase: 86 [IU]/L (ref 44–121)
Bilirubin Total: 0.5 mg/dL (ref 0.0–1.2)
Bilirubin, Direct: 0.22 mg/dL (ref 0.00–0.40)
Total Protein: 7.7 g/dL (ref 6.0–8.5)

## 2023-10-02 LAB — LIPID PANEL
Chol/HDL Ratio: 2 {ratio} (ref 0.0–4.4)
Cholesterol, Total: 76 mg/dL — ABNORMAL LOW (ref 100–199)
HDL: 38 mg/dL — ABNORMAL LOW (ref >39)
LDL Chol Calc (NIH): 23 mg/dL (ref 0–99)
Triglycerides: 62 mg/dL (ref 0–149)
VLDL Cholesterol Cal: 15 mg/dL (ref 5–40)

## 2023-10-02 NOTE — Patient Instructions (Signed)
Medication Instructions:  No changes *If you need a refill on your cardiac medications before your next appointment, please call your pharmacy*   Lab Work: Today: lipids, liver function If you have labs (blood work) drawn today and your tests are completely normal, you will receive your results only by: MyChart Message (if you have MyChart) OR A paper copy in the mail If you have any lab test that is abnormal or we need to change your treatment, we will call you to review the results.   Testing/Procedures: none   Follow-Up: At [facility], you and your health needs are our priority.  As part of our continuing mission to provide you with exceptional heart care, we have created designated Provider Care Teams.  These Care Teams include your primary Cardiologist (physician) and Advanced Practice Providers (APPs -  Physician Assistants and Nurse Practitioners) who all work together to provide you with the care you need, when you need it.   Your next appointment:   6 month(s)  Provider:   Jari Favre, PA-C, Ronie Spies, PA-C, Robin Searing, NP, Jacolyn Reedy, PA-C, Eligha Bridegroom, NP, Tereso Newcomer, PA-C, or Perlie Gold, PA-C       Other Instructions We will work to get you set up with CPAP.  Our sleep department will be in contact with you.

## 2023-10-03 NOTE — Addendum Note (Signed)
Addended by: Reesa Chew on: 10/03/2023 03:33 PM   Modules accepted: Orders

## 2023-10-03 NOTE — Telephone Encounter (Addendum)
Per Theresa Scott, she is interested now  Order for cpap titration is in and will be precerted. NO PA REQ FOR TITRATION.

## 2023-10-08 NOTE — Telephone Encounter (Signed)
PAP: Patient assistance application for [place] has been approved by PAP Companies: [employer]  through 10/22/24. Medication should be delivered to PAP Delivery: Home. For further shipping updates, please contact [employer] at [phone]

## 2023-10-26 ENCOUNTER — Other Ambulatory Visit: Payer: Self-pay | Admitting: *Deleted

## 2023-10-26 DIAGNOSIS — R42 Dizziness and giddiness: Secondary | ICD-10-CM

## 2023-10-26 MED ORDER — ONDANSETRON 8 MG PO TBDP: 8.0000 mg | ORAL_TABLET | Freq: Three times a day (TID) | ORAL | 3 refills | Status: AC | PRN

## 2023-10-29 ENCOUNTER — Ambulatory Visit: Payer: PRIVATE HEALTH INSURANCE | Admitting: Family Medicine

## 2023-10-29 NOTE — Progress Notes (Deleted)
   Established Patient Office Visit  Subjective  Patient ID: CATHERN TAHIR, female    DOB: 1956-05-22  Age: 68 y.o. MRN: [id number]  No chief complaint on file.   HPI  F/U Fatty liver dx - follows with Dr. Avram. Recent bump in liver enzyme seemed to have improved on recent labs.    Impaired fasting glucose-no increased thirst or urination. No symptoms consistent with hypoglycemia.   {History (Optional):23778}  ROS    Objective:     There were no vitals taken for this visit. {Vitals History (Optional):23777}  Physical Exam   No results found for any visits on 10/29/23.  {Labs (Optional):23779}  The ASCVD Risk score (Arnett DK, et al., 2019) failed to calculate for the following reasons:   The valid total cholesterol range is 130 to 320 mg/dL    Assessment & Plan:   Problem List Items Addressed This Visit       Digestive   Liver cirrhosis secondary to NASH (HCC) - Primary     Endocrine   IMPAIRED FASTING GLUCOSE    No follow-ups on file.    Dorothyann Byars, MD

## 2023-11-05 ENCOUNTER — Ambulatory Visit: Payer: PRIVATE HEALTH INSURANCE | Admitting: Family Medicine

## 2023-11-07 ENCOUNTER — Other Ambulatory Visit: Payer: Self-pay | Admitting: Family Medicine

## 2023-11-09 DIAGNOSIS — G894 Chronic pain syndrome: Secondary | ICD-10-CM | POA: Diagnosis not present

## 2023-11-09 DIAGNOSIS — M4326 Fusion of spine, lumbar region: Secondary | ICD-10-CM | POA: Diagnosis not present

## 2023-11-09 DIAGNOSIS — M7918 Myalgia, other site: Secondary | ICD-10-CM | POA: Diagnosis not present

## 2023-11-12 ENCOUNTER — Ambulatory Visit: Payer: Self-pay | Admitting: Family Medicine

## 2023-11-12 ENCOUNTER — Ambulatory Visit (HOSPITAL_BASED_OUTPATIENT_CLINIC_OR_DEPARTMENT_OTHER): Payer: Medicare Other | Attending: Cardiology | Admitting: Cardiology

## 2023-11-12 VITALS — Ht 65.0 in | Wt 171.0 lb

## 2023-11-12 DIAGNOSIS — R400 Somnolence: Secondary | ICD-10-CM | POA: Diagnosis not present

## 2023-11-12 DIAGNOSIS — E1159 Type 2 diabetes mellitus with other circulatory complications: Secondary | ICD-10-CM

## 2023-11-12 DIAGNOSIS — K7460 Unspecified cirrhosis of liver: Secondary | ICD-10-CM

## 2023-11-12 DIAGNOSIS — E1169 Type 2 diabetes mellitus with other specified complication: Secondary | ICD-10-CM

## 2023-11-12 DIAGNOSIS — R072 Precordial pain: Secondary | ICD-10-CM

## 2023-11-12 DIAGNOSIS — G4733 Obstructive sleep apnea (adult) (pediatric): Secondary | ICD-10-CM | POA: Diagnosis not present

## 2023-11-12 DIAGNOSIS — R002 Palpitations: Secondary | ICD-10-CM

## 2023-11-12 DIAGNOSIS — E119 Type 2 diabetes mellitus without complications: Secondary | ICD-10-CM

## 2023-11-12 DIAGNOSIS — I340 Nonrheumatic mitral (valve) insufficiency: Secondary | ICD-10-CM

## 2023-11-12 NOTE — Telephone Encounter (Signed)
Patient calling with needing guidance with her medication. Patient was questioning her losartan 25mg  and Coreg 3.125mg . Went through medication with patient. Patient is needing her losartan be sent to the [facility] pharmacy.   Copied from CRM [id number]. Topic: Clinical - Medication Question >> Nov 12, 2023  4:47 PM Corin V wrote: Reason for CRM: Patient is calling regarding questions about her carvedilol (COREG) 3.125 MG tablet and losartan (COZAAR) 25 MG tablet prescriptions. Please call patient back to explain instructions and answer any questions on how to take medications. Reason for Disposition  Health Information question, no triage required and triager able to answer question  Answer Assessment - Initial Assessment Questions 1. REASON FOR CALL or QUESTION: "What is your reason for calling today?" or "How can I best help you?" or "What question do you have that I can help answer?"     Patient needing guidance and education for her medications  Protocols used: Information Only Call - No Triage-A-AH

## 2023-11-13 NOTE — Procedures (Signed)
    Patient Name: Theresa Scott, Theresa Scott Date: 11/12/2023 Gender: Female D.O.B: 09/23/1956 Age (years): 67 Referring Provider: Alverda Skeans Height (inches): 65 Interpreting Physician: Armanda Magic MD, ABSM Weight (lbs): 171 RPSGT: Ulyess Mort BMI: 28 MRN: [id number] Neck Size: 13.00  CLINICAL INFORMATION The patient is referred for a CPAP titration to treat sleep apnea.  SLEEP STUDY TECHNIQUE As per the AASM Manual for the Scoring of Sleep and Associated Events v2.3 (April 2016) with a hypopnea requiring 4% desaturations.  The channels recorded and monitored were frontal, central and occipital EEG, electrooculogram (EOG), submentalis EMG (chin), nasal and oral airflow, thoracic and abdominal wall motion, anterior tibialis EMG, snore microphone, electrocardiogram, and pulse oximetry. Continuous positive airway pressure (CPAP) was initiated at the beginning of the study and titrated to treat sleep-disordered breathing.  MEDICATIONS Medications self-administered by patient taken the night of the study : ATORVASTATIN, BACLOFEN, GABAPENTIN, LOSARTAN, OXYCODONE HCL  TECHNICIAN COMMENTS Comments added by technician: Patient was ordered as a cpap titration. Comments added by scorer: N/A  RESPIRATORY PARAMETERS Optimal PAP Pressure (cm):16  AHI at Optimal Pressure (/hr):46.1 Overall Minimal O2 (%):82.0  Supine % at Optimal Pressure (%):100 Minimal O2 at Optimal Pressure (%): 90.0   SLEEP ARCHITECTURE The study was initiated at 11:13:06 PM and ended at 5:23:15 AM.  Sleep onset time was 2.1 minutes and the sleep efficiency was 87.8%. The total sleep time was 325 minutes.  The patient spent 8.2% of the night in stage N1 sleep, 86.5% in stage N2 sleep, 0.0% in stage N3 and 5.4% in REM.Stage REM latency was 167.0 minutes  Wake after sleep onset was 43.0. Alpha intrusion was absent. Supine sleep was 15.48%.  CARDIAC DATA The 2 lead EKG demonstrated sinus rhythm. The mean heart rate  was 72.7 beats per minute. Other EKG findings include: None.  LEG MOVEMENT DATA The total Periodic Limb Movements of Sleep (PLMS) were 0. The PLMS index was 0.0. A PLMS index of <15 is considered normal in adults.  IMPRESSIONS - The optimal PAP pressure could not be obtained due to ongoing respiratory events.  - Central sleep apnea was not noted during this titration (CAI = 0.9/h). - Moderate oxygen desaturations were observed during this titration (min O2 = 82.0%). - The patient snored with soft snoring volume during this titration study. - No cardiac abnormalities were observed during this study. - Clinically significant periodic limb movements were not noted during this study. Arousals associated with PLMs were rare.  DIAGNOSIS - Obstructive Sleep Apnea (G47.33)  RECOMMENDATIONS - Trial of ResMed auto CPAP therapy from 4 to 20cm H2O with a Small-Medium size Fisher&Paykel Full Face Evora Full mask and heated humidification. - Avoid alcohol, sedatives and other CNS depressants that may worsen sleep apnea and disrupt normal sleep architecture. - Sleep hygiene should be reviewed to assess factors that may improve sleep quality. - Weight management and regular exercise should be initiated or continued. - Return to [facility] Center for re-evaluation after 4 weeks of therapy  [Electronically signed] 11/13/2023 10:21 PM  Armanda Magic MD, ABSM Diplomate, American Board of Sleep Medicine

## 2023-11-14 ENCOUNTER — Ambulatory Visit: Payer: PRIVATE HEALTH INSURANCE

## 2023-11-14 ENCOUNTER — Telehealth: Payer: Self-pay | Admitting: *Deleted

## 2023-11-14 ENCOUNTER — Other Ambulatory Visit: Payer: PRIVATE HEALTH INSURANCE

## 2023-11-14 NOTE — Telephone Encounter (Signed)
-----   Message from Armanda Magic sent at 11/13/2023 10:23 PM EST ----- Please let patient know that they had a successful PAP titration and let DME know that orders are in EPIC.  Please set up 6 week OV with me.

## 2023-11-14 NOTE — Telephone Encounter (Signed)
The patient has been notified of the result and verbalized understanding.  All questions (if any) were answered. Latrelle Dodrill, CMA 11/14/2023 5:03 PM     Upon patient request DME selection is [facility] Patient understands he will be contacted by [facility] to set up his cpap. Patient understands to call if [facility] does not contact him with new setup in a timely manner. Patient understands they will be called once confirmation has been received from [facility] that they have received their new machine to schedule 10 week follow up appointment.   [facility] notified of new cpap order  Please add to airview Patient was grateful for the call and thanked me.

## 2023-11-15 MED ORDER — LOSARTAN POTASSIUM 25 MG PO TABS: 25.0000 mg | ORAL_TABLET | Freq: Every day | ORAL | 0 refills | Status: DC

## 2023-11-15 NOTE — Addendum Note (Signed)
Addended by: Elizabeth Palau on: 11/15/2023 10:28 AM   Modules accepted: Orders

## 2023-11-15 NOTE — Telephone Encounter (Signed)
Change of pharmacy to [facility] - remainder of current rx rf sent to [facility].

## 2023-11-21 ENCOUNTER — Telehealth: Payer: Self-pay | Admitting: Family Medicine

## 2023-11-21 NOTE — Telephone Encounter (Signed)
Call pt to schedule patient for follow-up diabetes appointment.

## 2023-11-22 ENCOUNTER — Ambulatory Visit: Payer: Medicare Other | Admitting: Family Medicine

## 2023-11-22 ENCOUNTER — Encounter: Payer: Self-pay | Admitting: Family Medicine

## 2023-11-22 VITALS — BP 129/82 | HR 79 | Ht 65.0 in | Wt 169.0 lb

## 2023-11-22 DIAGNOSIS — E119 Type 2 diabetes mellitus without complications: Secondary | ICD-10-CM

## 2023-11-22 DIAGNOSIS — E785 Hyperlipidemia, unspecified: Secondary | ICD-10-CM | POA: Diagnosis not present

## 2023-11-22 DIAGNOSIS — G4733 Obstructive sleep apnea (adult) (pediatric): Secondary | ICD-10-CM

## 2023-11-22 DIAGNOSIS — R7301 Impaired fasting glucose: Secondary | ICD-10-CM

## 2023-11-22 DIAGNOSIS — M5489 Other dorsalgia: Secondary | ICD-10-CM | POA: Diagnosis not present

## 2023-11-22 HISTORY — DX: Obstructive sleep apnea (adult) (pediatric): G47.33

## 2023-11-22 LAB — POCT GLYCOSYLATED HEMOGLOBIN (HGB A1C): Hemoglobin A1C: 5.9 % — AB (ref 4.0–5.6)

## 2023-11-22 LAB — POCT URINALYSIS DIP (CLINITEK)
Bilirubin, UA: NEGATIVE
Blood, UA: NEGATIVE
Glucose, UA: 500 mg/dL — AB
Ketones, POC UA: NEGATIVE mg/dL
Leukocytes, UA: NEGATIVE
Nitrite, UA: NEGATIVE
POC PROTEIN,UA: NEGATIVE
Spec Grav, UA: 1.025 (ref 1.010–1.025)
Urobilinogen, UA: 1 U/dL
pH, UA: 5.5 (ref 5.0–8.0)

## 2023-11-22 MED ORDER — ATORVASTATIN CALCIUM 20 MG PO TABS: 20.0000 mg | ORAL_TABLET | Freq: Every day | ORAL | 3 refills | Status: DC

## 2023-11-22 NOTE — Progress Notes (Signed)
Established Patient Office Visit  Subjective  Patient ID: Theresa Scott, female    DOB: Oct 14, 1956  Age: 68 y.o. MRN: [id number]  Chief Complaint  Patient presents with   Diabetes    HPI  Diabetes - no hypoglycemic events. No wounds or sores that are not healing well. No increased thirst or urination. Checking glucose at home. Taking medications as prescribed without any side effects.  She did recently see the cardiologist and they are going to get her set up with CPAP.  He had a sleep study on January 20 she should hopefully be getting the device delivered soon.  She reports that she has had some bilateral low back pain and wants to make sure it is not a UTI she has not had any dysuria or hematuria.  No fevers chills or sweats.  It is a little worse on the left side compared to the right.    ROS    Objective:     BP 129/82   Pulse 79   Ht 5\' 5"  (1.651 m)   Wt 169 lb (76.7 kg)   SpO2 100%   BMI 28.12 kg/m    Physical Exam Vitals and nursing note reviewed.  Constitutional:      Appearance: Normal appearance.  HENT:     Head: Normocephalic and atraumatic.  Eyes:     Conjunctiva/sclera: Conjunctivae normal.  Cardiovascular:     Rate and Rhythm: Normal rate and regular rhythm.  Pulmonary:     Effort: Pulmonary effort is normal.     Breath sounds: Normal breath sounds.  Skin:    General: Skin is warm and dry.  Neurological:     Mental Status: She is alert.  Psychiatric:        Mood and Affect: Mood normal.      Results for orders placed or performed in visit on 11/22/23  POCT HgB A1C  Result Value Ref Range   Hemoglobin A1C 5.9 (A) 4.0 - 5.6 %   HbA1c POC (<> result, manual entry)     HbA1c, POC (prediabetic range)     HbA1c, POC (controlled diabetic range)    POCT URINALYSIS DIP (CLINITEK)  Result Value Ref Range   Color, UA yellow yellow   Clarity, UA clear clear   Glucose, UA =500 (A) negative mg/dL   Bilirubin, UA negative negative   Ketones,  POC UA negative negative mg/dL   Spec Grav, UA 4.782 9.562 - 1.025   Blood, UA negative negative   pH, UA 5.5 5.0 - 8.0   POC PROTEIN,UA negative negative, trace   Urobilinogen, UA 1.0 0.2 or 1.0 E.U./dL   Nitrite, UA Negative Negative   Leukocytes, UA Negative Negative      The ASCVD Risk score (Arnett DK, et al., 2019) failed to calculate for the following reasons:   The valid total cholesterol range is 130 to 320 mg/dL    Assessment & Plan:   Problem List Items Addressed This Visit       Respiratory   OSA (obstructive sleep apnea)   Will be starting CPAP soon. Ordered through Cardiology.         Endocrine   Impaired fasting glucose   A1c looks great today at 5.9.  Currently only taking metformin every other day so organ to hold it completely at this point as she has been having some low blood sugars.  And Jardiance. Continue ARB.  Follow-up in 4 months.        Other  Hyperlipemia   Relevant Medications   atorvastatin (LIPITOR) 20 MG tablet   Other Visit Diagnoses       Type 2 diabetes mellitus without complication, without long-term current use of insulin (HCC)    -  Primary   Relevant Medications   atorvastatin (LIPITOR) 20 MG tablet   Other Relevant Orders   POCT HgB A1C (Completed)     Other acute back pain       Relevant Orders   POCT URINALYSIS DIP (CLINITEK) (Completed)       Low back pain-urinalysis is negative.  Gave her reassurance that I really do think it is more musculoskeletal and continue with conservative care if not improving then please let us know.  Return in about 3 months (around 02/27/2024) for Diabetes follow-up.    Nani Gasser, MD

## 2023-11-22 NOTE — Assessment & Plan Note (Addendum)
A1c looks great today at 5.9.  Currently only taking metformin every other day so organ to hold it completely at this point as she has been having some low blood sugars.  And Jardiance. Continue ARB.  Follow-up in 4 months.

## 2023-11-22 NOTE — Assessment & Plan Note (Signed)
Will be starting CPAP soon. Ordered through Cardiology.

## 2023-11-23 ENCOUNTER — Telehealth: Payer: Self-pay | Admitting: Family Medicine

## 2023-11-23 NOTE — Telephone Encounter (Signed)
Pls call pt and ler her know Dr. Lynnette Caffey is ok with her stopping the carvedilol. So will remove from her med list and she will need to tell her pharmacy to not send anymore refills.

## 2023-11-23 NOTE — Telephone Encounter (Signed)
That is perfect. She actually ended up coming in yesterday so we were able to her her A1C

## 2023-11-23 NOTE — Telephone Encounter (Signed)
HI Dr. Lynnette Caffey,   Ms. Theresa Scott came in yesterday for an appointment and says that she was not clear about whether or not she was supposed to be taking her carvedilol or not she said she thought she was told to stop it but then received a refill.  She was taking it when she came into the office yesterday blood pressures looked great.  I tried to look back in your notes to see if I have seen anything about discontinuing it.  So just wanted to clarify it with you and I will be happy to let her know what you would recommend.

## 2023-11-26 ENCOUNTER — Other Ambulatory Visit: Payer: Self-pay | Admitting: Family Medicine

## 2023-11-26 DIAGNOSIS — F322 Major depressive disorder, single episode, severe without psychotic features: Secondary | ICD-10-CM

## 2023-11-26 NOTE — Telephone Encounter (Signed)
Spoke with the pt and advised her to stop taking her carvedilol, she voiced her understanding. Roselyn Reef, CMA

## 2023-12-06 DIAGNOSIS — H43813 Vitreous degeneration, bilateral: Secondary | ICD-10-CM | POA: Diagnosis not present

## 2023-12-06 DIAGNOSIS — E119 Type 2 diabetes mellitus without complications: Secondary | ICD-10-CM | POA: Diagnosis not present

## 2023-12-06 DIAGNOSIS — G44229 Chronic tension-type headache, not intractable: Secondary | ICD-10-CM | POA: Diagnosis not present

## 2023-12-31 ENCOUNTER — Other Ambulatory Visit: Payer: Self-pay | Admitting: Family Medicine

## 2023-12-31 DIAGNOSIS — M810 Age-related osteoporosis without current pathological fracture: Secondary | ICD-10-CM

## 2024-01-02 ENCOUNTER — Ambulatory Visit: Payer: PRIVATE HEALTH INSURANCE

## 2024-01-02 DIAGNOSIS — Z1231 Encounter for screening mammogram for malignant neoplasm of breast: Secondary | ICD-10-CM | POA: Diagnosis not present

## 2024-01-02 DIAGNOSIS — Z0000 Encounter for general adult medical examination without abnormal findings: Secondary | ICD-10-CM

## 2024-01-02 DIAGNOSIS — Z780 Asymptomatic menopausal state: Secondary | ICD-10-CM

## 2024-01-02 DIAGNOSIS — M810 Age-related osteoporosis without current pathological fracture: Secondary | ICD-10-CM | POA: Diagnosis not present

## 2024-01-02 DIAGNOSIS — Z13820 Encounter for screening for osteoporosis: Secondary | ICD-10-CM | POA: Diagnosis not present

## 2024-01-03 DIAGNOSIS — G894 Chronic pain syndrome: Secondary | ICD-10-CM | POA: Diagnosis not present

## 2024-01-03 DIAGNOSIS — M4326 Fusion of spine, lumbar region: Secondary | ICD-10-CM | POA: Diagnosis not present

## 2024-01-03 DIAGNOSIS — M545 Low back pain, unspecified: Secondary | ICD-10-CM | POA: Diagnosis not present

## 2024-01-04 ENCOUNTER — Encounter: Payer: Self-pay | Admitting: Physician Assistant

## 2024-01-04 NOTE — Progress Notes (Signed)
 Bone density has stayed the same! GREAT news. You still have a T-score of -2.8! Stay on fosamax repeat in dexa in 2 years.

## 2024-01-04 NOTE — Progress Notes (Signed)
 Normal mammogram. Follow up in 1 year.

## 2024-01-22 ENCOUNTER — Ambulatory Visit: Payer: PRIVATE HEALTH INSURANCE | Attending: Cardiology | Admitting: Cardiology

## 2024-01-22 ENCOUNTER — Encounter: Payer: Self-pay | Admitting: Cardiology

## 2024-01-22 VITALS — BP 140/86 | HR 72 | Ht 63.0 in | Wt 170.0 lb

## 2024-01-22 DIAGNOSIS — E1159 Type 2 diabetes mellitus with other circulatory complications: Secondary | ICD-10-CM

## 2024-01-22 DIAGNOSIS — I152 Hypertension secondary to endocrine disorders: Secondary | ICD-10-CM

## 2024-01-22 DIAGNOSIS — G4733 Obstructive sleep apnea (adult) (pediatric): Secondary | ICD-10-CM | POA: Diagnosis not present

## 2024-01-22 NOTE — Patient Instructions (Signed)
 Medication Instructions:  Your physician recommends that you continue on your current medications as directed. Please refer to the Current Medication list given to you today.  *If you need a refill on your cardiac medications before your next appointment, please call your pharmacy*   Lab Work: NONE  If you have labs (blood work) drawn today and your tests are completely normal, you will receive your results only by: MyChart Message (if you have MyChart) OR A paper copy in the mail If you have any lab test that is abnormal or we need to change your treatment, we will call you to review the results.   Testing/Procedures: NONE   Follow-Up: At [facility], you and your health needs are our priority.  As part of our continuing mission to provide you with exceptional heart care, we have created designated Provider Care Teams.  These Care Teams include your primary Cardiologist (physician) and Advanced Practice Providers (APPs -  Physician Assistants and Nurse Practitioners) who all work together to provide you with the care you need, when you need it.    Your next appointment:   1 year(s)  Provider:   Armanda Magic, MD

## 2024-01-22 NOTE — Progress Notes (Signed)
 Sleep Medicine CONSULT Note    Date:  01/22/2024   ID:  Theresa Scott, DOB 04-24-1956, MRN [id number]  PCP:  Agapito Games, MD  Cardiologist: Orbie Pyo, MD   Chief Complaint  Patient presents with   New Patient (Initial Visit)    Obstructive sleep apnea    History of Present Illness:  Theresa Scott is a 68 y.o. female who is being seen today for the evaluation of obstructive sleep apnea at the request of Alverda Skeans MD.  This is a 68 year old female with a history of CKD, depression, diabetes, anxiety, GERD, hyperlipidemia, hypertension.  She was seen by Dr. Lynnette Caffey in the fall 2023 and was complaining of excessive daytime sleepiness.  She underwent in-lab PSG which demonstrated mild obstructive sleep apnea with an AHI of 9.5/h overall but moderate during REM sleep with a REM AHI of 16.0/h.  Lowest O2 saturation was 85%.  The patient snored loudly.   She underwent CPAP titration 11/12/2023 and was titrated to auto CPAP from 4 to 20 cm H2O.  She is now referred for sleep medicine consultation to establish sleep care and treatment of obstructive sleep apnea.  She is doing well with her PAP device and thinks that she has gotten used to it.  She tolerates the full face mask and feels the pressure is adequate.  Since going on PAP she feels rested in the am is she slept well the night before.  She still has daytime sleepiness despite the OSA being adequately treated.  She goes to bed at 12noon to 2am but sleeps until 10-12Noon.  She denies any significant nasal dryness or nasal congestion.  She has problems with dry mouth but has adjusted the humidity. She does not think that he snores.    Past Medical History:  Diagnosis Date   Allergy    Anxiety    Cancer (HCC) 08-22-2022   Just found out I have cirrhosis   Cataract 2019   Chronic kidney disease    Complication of anesthesia    hard to wake up and swelling after kidney stone removal in 1980 per pt   Degenerative disc  disease    Depression    Diabetes mellitus    Fatty liver disease, nonalcoholic    Gallstones    GERD (gastroesophageal reflux disease)    Hypercholesterolemia    Hypertension    Kidney stones    Liver cirrhosis secondary to NASH (HCC)    Nocturnal hypoxemia 05/23/2023   Obesity    OSA (obstructive sleep apnea) 11/22/2023   Osteoporosis    Pancreatitis    SOB (shortness of breath)    Swelling of both lower extremities     Past Surgical History:  Procedure Laterality Date   BACK SURGERY  1990,2003,2004,2010   BREAST SURGERY     CARDIAC CATHETERIZATION  2011   [facility].    CARDIAC SURGERY  2002   ablation   CHOLECYSTECTOMY  1976   COLONOSCOPY  2008   ESOPHAGOGASTRODUODENOSCOPY N/A 01/27/2013   Procedure: ESOPHAGOGASTRODUODENOSCOPY (EGD) upper endoscopy;  Surgeon: Kandis Cocking, MD;  Location: [facility];  Service: General;  Laterality: N/A;   EYE SURGERY     GASTRIC BYPASS  08/22/2010   HAND SURGERY  2000   right   SPINE SURGERY  1990,2001,2004,2005,2015   SUBTHALAMIC STIMULATOR BATTERY REPLACEMENT  08/16/2020   TEE WITHOUT CARDIOVERSION N/A 03/07/2023   Procedure: TRANSESOPHAGEAL ECHOCARDIOGRAM;  Surgeon: Maisie Fus, MD;  Location: [facility]  INVASIVE CV LAB;  Service: Cardiovascular;  Laterality: N/A;   TONSILLECTOMY  1971    Current Medications: No outpatient medications have been marked as taking for the 01/22/24 encounter (Clinical Support) with Quintella Reichert, MD.    Allergies:   Bee venom and Latex   Social History   Socioeconomic History   Marital status: Married    Spouse name: Susann Givens   Number of children: 4   Years of education: 9   Highest education level: GED or equivalent  Occupational History   Occupation: [employer]    Comment: retired  Tobacco Use   Smoking status: Former    Current packs/day: 0.00    Average packs/day: 0.5 packs/day for 18.0 years (9.0 ttl pk-yrs)    Types: Cigarettes    Start date: 08/04/1972    Quit date:  08/04/1990    Years since quitting: 33.4   Smokeless tobacco: Never  Vaping Use   Vaping status: Never Used  Substance and Sexual Activity   Alcohol use: No   Drug use: No   Sexual activity: Yes    Birth control/protection: None  Other Topics Concern   Not on file  Social History Narrative   Married 1 daughter (73) and 3 adopted children   Disabled, chronic back pain, 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   She enjoys crocheting       Social Drivers of Corporate investment banker Strain: Low Risk  (11/03/2023)   Overall Financial Resource Strain (CARDIA)    Difficulty of Paying Living Expenses: Not very hard  Food Insecurity: No Food Insecurity (11/03/2023)   Hunger Vital Sign    Worried About Running Out of Food in the Last Year: Never true    Ran Out of Food in the Last Year: Never true  Transportation Needs: No Transportation Needs (11/03/2023)   PRAPARE - Administrator, Civil Service (Medical): No    Lack of Transportation (Non-Medical): No  Physical Activity: Inactive (11/03/2023)   Exercise Vital Sign    Days of Exercise per Week: 0 days    Minutes of Exercise per Session: 0 min  Stress: Stress Concern Present (11/03/2023)   [employer] of Occupational Health - Occupational Stress Questionnaire    Feeling of Stress : To some extent  Social Connections: Socially Integrated (11/03/2023)   Social Connection and Isolation Panel [NHANES]    Frequency of Communication with Friends and Family: More than three times a week    Frequency of Social Gatherings with Friends and Family: More than three times a week    Attends Religious Services: More than 4 times per year    Active Member of [employer] or Organizations: Yes    Attends Engineer, structural: More than 4 times per year    Marital Status: Married     Family History:  The patient's family history includes Addison's disease in her father; Alcohol abuse in her brother, brother, daughter, and  mother; COPD in her brother, mother, and sister; Cancer in her brother, brother, mother, and sister; Cirrhosis in her sister; Colon cancer in her brother; Diabetes in her sister and sister; Heart attack in her sister; Kidney failure in her brother; Liver cancer in her brother; Lung cancer in her mother and sister; Miscarriages / [place] in her mother.   ROS:   Please see the history of present illness.    ROS All other systems reviewed and are negative.      No data to display  PHYSICAL EXAM:   VS:  There were no vitals taken for this visit.   GEN: Well nourished, well developed, in no acute distress  HEENT: normal  Neck: no JVD, carotid bruits, or masses Cardiac: RRR; no murmurs, rubs, or gallops,no edema.  Intact distal pulses bilaterally.  Respiratory:  clear to auscultation bilaterally, normal work of breathing GI: soft, nontender, nondistended, + BS MS: no deformity or atrophy  Skin: warm and dry, no rash Neuro:  Alert and Oriented x 3, Strength and sensation are intact Psych: euthymic mood, full affect  Wt Readings from Last 3 Encounters:  11/22/23 169 lb (76.7 kg)  11/12/23 171 lb (77.6 kg)  10/02/23 177 lb (80.3 kg)      Studies/Labs Reviewed:   PSG, CPAP titration, PAP compliance download  Recent Labs: 06/18/2023: BUN 13; Creatinine, Ser 0.78; Hemoglobin 14.0; Platelets 130; Potassium 4.4; Sodium 141 10/02/2023: ALT 30    ASSESSMENT:    1. Obstructive sleep apnea syndrome   2. Hypertension associated with diabetes (HCC)      PLAN:  In order of problems listed above:  OSA - The patient is tolerating PAP therapy well without any problems. The PAP download performed by his DME was personally reviewed and interpreted by me today and showed an AHI of 0.8 /hr on auto CPAP from 4-20 cm H2O with 97% compliance in using more than 4 hours nightly.  The patient has been using and benefiting from PAP use and will continue to benefit from therapy.    Hypertension -BP borderline controlled on exam today -Continue prescription drug management with losartan 25 mg daily with as needed refills  Time Spent: 20 minutes total time of encounter, including 15 minutes spent in face-to-face patient care on the date of this encounter. This time includes coordination of care and counseling regarding above mentioned problem list. Remainder of non-face-to-face time involved reviewing chart documents/testing relevant to the patient encounter and documentation in the medical record. I have independently reviewed documentation from referring provider  Medication Adjustments/Labs and Tests Ordered: Current medicines are reviewed at length with the patient today.  Concerns regarding medicines are outlined above.  Medication changes, Labs and Tests ordered today are listed in the Patient Instructions below.  There are no Patient Instructions on file for this visit.   Signed, Armanda Magic, MD  01/22/2024 11:07 AM    [facility] [address] [place], [place], [place]  [address] Phone: [phone]; Fax: [phone]

## 2024-01-24 ENCOUNTER — Telehealth: Payer: Self-pay

## 2024-01-24 DIAGNOSIS — K7460 Unspecified cirrhosis of liver: Secondary | ICD-10-CM

## 2024-01-24 NOTE — Telephone Encounter (Signed)
 Left message on machine to call back

## 2024-01-24 NOTE — Telephone Encounter (Signed)
 Spoke with the pt and made her aware she is due for [place] and labs. Orders entered. She will come in as soon as she is able. The schedulers will call her to set up US>

## 2024-01-24 NOTE — Telephone Encounter (Signed)
-----   Message from Nurse Wilson Singer sent at 01/24/2024  8:18 AM EDT -----  ----- Message ----- From: Lucky Cowboy, RN Sent: 01/24/2024   8:00 AM EDT To: Emeline Darling, RN; Lucky Cowboy, RN  Patient needs RUQ [place] & labs (CBC, CMET, INR, AFP) ordered for 6 month follow up. Refer to [place] note 07/26/23.

## 2024-01-28 ENCOUNTER — Other Ambulatory Visit (INDEPENDENT_AMBULATORY_CARE_PROVIDER_SITE_OTHER)

## 2024-01-28 DIAGNOSIS — K7460 Unspecified cirrhosis of liver: Secondary | ICD-10-CM | POA: Diagnosis not present

## 2024-01-28 DIAGNOSIS — K7581 Nonalcoholic steatohepatitis (NASH): Secondary | ICD-10-CM | POA: Diagnosis not present

## 2024-01-28 LAB — CBC WITH DIFFERENTIAL/PLATELET
Basophils Absolute: 0 10*3/uL (ref 0.0–0.1)
Basophils Relative: 0.5 % (ref 0.0–3.0)
Eosinophils Absolute: 0.1 10*3/uL (ref 0.0–0.7)
Eosinophils Relative: 1.4 % (ref 0.0–5.0)
HCT: 42.6 % (ref 36.0–46.0)
Hemoglobin: 14 g/dL (ref 12.0–15.0)
Lymphocytes Relative: 22.3 % (ref 12.0–46.0)
Lymphs Abs: 0.9 10*3/uL (ref 0.7–4.0)
MCHC: 32.9 g/dL (ref 30.0–36.0)
MCV: 92.1 fl (ref 78.0–100.0)
Monocytes Absolute: 0.3 10*3/uL (ref 0.1–1.0)
Monocytes Relative: 8.4 % (ref 3.0–12.0)
Neutro Abs: 2.8 10*3/uL (ref 1.4–7.7)
Neutrophils Relative %: 67.4 % (ref 43.0–77.0)
Platelets: 129 10*3/uL — ABNORMAL LOW (ref 150.0–400.0)
RBC: 4.62 Mil/uL (ref 3.87–5.11)
RDW: 13.8 % (ref 11.5–15.5)
WBC: 4.2 10*3/uL (ref 4.0–10.5)

## 2024-01-28 LAB — PROTIME-INR
INR: 1.2 ratio — ABNORMAL HIGH (ref 0.8–1.0)
Prothrombin Time: 12.8 s (ref 9.6–13.1)

## 2024-01-28 LAB — COMPREHENSIVE METABOLIC PANEL WITH GFR
ALT: 32 U/L (ref 0–35)
AST: 38 U/L — ABNORMAL HIGH (ref 0–37)
Albumin: 4.1 g/dL (ref 3.5–5.2)
Alkaline Phosphatase: 71 U/L (ref 39–117)
BUN: 15 mg/dL (ref 6–23)
CO2: 24 meq/L (ref 19–32)
Calcium: 8.7 mg/dL (ref 8.4–10.5)
Chloride: 108 meq/L (ref 96–112)
Creatinine, Ser: 0.68 mg/dL (ref 0.40–1.20)
GFR: 89.88 mL/min (ref >60.00)
Glucose, Bld: 120 mg/dL — ABNORMAL HIGH (ref 70–99)
Potassium: 3.6 meq/L (ref 3.5–5.1)
Sodium: 141 meq/L (ref 135–145)
Total Bilirubin: 0.7 mg/dL (ref 0.2–1.2)
Total Protein: 7.6 g/dL (ref 6.0–8.3)

## 2024-01-29 ENCOUNTER — Encounter: Payer: Self-pay | Admitting: Internal Medicine

## 2024-01-29 LAB — AFP TUMOR MARKER: AFP-Tumor Marker: 6.2 ng/mL — ABNORMAL HIGH

## 2024-01-31 ENCOUNTER — Ambulatory Visit (HOSPITAL_COMMUNITY)
Admission: RE | Admit: 2024-01-31 | Discharge: 2024-01-31 | Disposition: A | Discharge status: Home / Self Care | Payer: PRIVATE HEALTH INSURANCE | Source: Ambulatory Visit | Attending: Internal Medicine | Admitting: Internal Medicine

## 2024-01-31 DIAGNOSIS — K7460 Unspecified cirrhosis of liver: Secondary | ICD-10-CM | POA: Insufficient documentation

## 2024-01-31 DIAGNOSIS — K7581 Nonalcoholic steatohepatitis (NASH): Secondary | ICD-10-CM | POA: Diagnosis not present

## 2024-01-31 DIAGNOSIS — Z9049 Acquired absence of other specified parts of digestive tract: Secondary | ICD-10-CM | POA: Diagnosis not present

## 2024-02-04 ENCOUNTER — Other Ambulatory Visit: Payer: Self-pay | Admitting: *Deleted

## 2024-02-04 MED ORDER — OMEPRAZOLE 20 MG PO CPDR: 20.0000 mg | DELAYED_RELEASE_CAPSULE | Freq: Every day | ORAL | 1 refills | Status: DC

## 2024-02-11 ENCOUNTER — Other Ambulatory Visit: Payer: Self-pay | Admitting: Family Medicine

## 2024-02-11 DIAGNOSIS — K7460 Unspecified cirrhosis of liver: Secondary | ICD-10-CM

## 2024-02-11 DIAGNOSIS — E119 Type 2 diabetes mellitus without complications: Secondary | ICD-10-CM

## 2024-02-11 DIAGNOSIS — I152 Hypertension secondary to endocrine disorders: Secondary | ICD-10-CM

## 2024-02-11 DIAGNOSIS — R072 Precordial pain: Secondary | ICD-10-CM

## 2024-02-11 DIAGNOSIS — E1169 Type 2 diabetes mellitus with other specified complication: Secondary | ICD-10-CM

## 2024-02-11 DIAGNOSIS — I340 Nonrheumatic mitral (valve) insufficiency: Secondary | ICD-10-CM

## 2024-02-11 DIAGNOSIS — R400 Somnolence: Secondary | ICD-10-CM

## 2024-02-11 DIAGNOSIS — R002 Palpitations: Secondary | ICD-10-CM

## 2024-02-13 ENCOUNTER — Encounter: Payer: Self-pay | Admitting: Physician Assistant

## 2024-02-13 ENCOUNTER — Ambulatory Visit (INDEPENDENT_AMBULATORY_CARE_PROVIDER_SITE_OTHER): Admitting: Physician Assistant

## 2024-02-13 VITALS — BP 144/64 | HR 73 | Temp 98.1°F | Ht 65.0 in | Wt 169.0 lb

## 2024-02-13 DIAGNOSIS — L237 Allergic contact dermatitis due to plants, except food: Secondary | ICD-10-CM | POA: Diagnosis not present

## 2024-02-13 DIAGNOSIS — H6993 Unspecified Eustachian tube disorder, bilateral: Secondary | ICD-10-CM | POA: Diagnosis not present

## 2024-02-13 MED ORDER — METHYLPREDNISOLONE SODIUM SUCC 125 MG IJ SOLR
125.0000 mg | Freq: Once | INTRAMUSCULAR | Status: AC
  Administered 2024-02-13: 125 mg via INTRAMUSCULAR

## 2024-02-13 MED ORDER — TRIAMCINOLONE ACETONIDE 0.1 % EX CREA: 1.0000 | TOPICAL_CREAM | Freq: Two times a day (BID) | CUTANEOUS | 0 refills | Status: DC

## 2024-02-13 NOTE — Progress Notes (Unsigned)
   Acute Office Visit  Subjective:     Patient ID: Theresa Scott, female    DOB: Feb 04, 1956, 68 y.o.   MRN: [id number]  No chief complaint on file.   HPI Patient is in today for ***  ROS      Objective:    There were no vitals taken for this visit. {Vitals History (Optional):23777}  Physical Exam  No results found for any visits on 02/13/24.      Assessment & Plan:   Problem List Items Addressed This Visit   None   No orders of the defined types were placed in this encounter.   No follow-ups on file.  Santo Zahradnik, PA-C

## 2024-02-13 NOTE — Patient Instructions (Signed)
 Poison Ivy Dermatitis Poison ivy dermatitis is redness and soreness of the skin caused by chemicals in the leaves of the poison ivy plant. You may have very bad itching, swelling, a rash, and blisters. What are the causes? Touching a poison ivy plant. Touching something that has the chemical on it. This may include animals or objects that have come in contact with the plant. What increases the risk? Going outdoors often in wooded or [place] areas. Going outdoors without wearing protective clothing, such as closed shoes, long pants, and a long-sleeved shirt. What are the signs or symptoms?  Skin redness. Very bad itching. A rash that often includes bumps and blisters. The rash usually appears 48 hours after exposure, if you have had it before. If this is the first time you have it, the rash may not appear until a week after exposure. Swelling. This may occur if the reaction is very bad. Symptoms usually last for 1-2 weeks. The first time you get this condition, symptoms may last 3-4 weeks. How is this treated? This condition may be treated with: Hydrocortisone cream or calamine lotion to relieve itching. Oatmeal baths to soothe the skin. Medicines, such as over-the-counter antihistamine tablets. Oral steroid medicine for very bad reactions. Follow these instructions at home: Medicines Take or apply over-the-counter and prescription medicines only as told by your doctor. Use hydrocortisone cream or calamine lotion as needed to help with itching. General instructions Do not scratch or rub your skin. Put a cold, wet cloth (cold compress) on the affected areas or take baths in cool water. This will help with itching. Avoid hot baths and showers. Take oatmeal baths as needed. Use colloidal oatmeal. You can get this at a pharmacy or grocery store. Follow the instructions on the package. While you have the rash, wash your clothes right after you wear them. Check the affected area every day  for signs of infection. Check for: More redness, swelling, or pain. Fluid or blood. Warmth. Pus or a bad smell. Keep all follow-up visits. Your doctor may want to see how your skin is doing with treatment. How is this prevented?  Know what poison ivy looks like, so you can avoid it. This plant has three leaves with flowering branches on a single stem. The leaves are glossy. The leaves have uneven edges that come to a point. If you touch poison ivy, wash your skin with soap and water right away. Be sure to wash under your fingernails. When hiking or camping, wear long pants, a long-sleeved shirt, long socks, and hiking boots. You can also use a lotion on your skin that helps to prevent contact with poison ivy. If you think that your clothes or outdoor gear came in contact with poison ivy, rinse them off with a garden hose before you bring them inside your house. When doing yard work or gardening, wear gloves, long sleeves, long pants, and boots. Wash your garden tools and gloves if they come in contact with poison ivy. If you think that your pet has come into contact with poison ivy, wash them with pet shampoo and water. Make sure to wear gloves while washing your pet. Contact a doctor if: You have open sores in the rash area. You have any signs of infection. You have redness that spreads past the rash area. You have a fever. You have a rash over a large area of your body. You have a rash on your eyes, mouth, or genitals. Your rash does not get better after  a few weeks. Get help right away if: Your face swells or your eyes swell shut. You have trouble breathing. You have trouble swallowing. These symptoms may be an emergency. Do not wait to see if the symptoms will go away. Get help right away. Call 911. This information is not intended to replace advice given to you by your health care provider. Make sure you discuss any questions you have with your health care provider. Document  Revised: 03/09/2022 Document Reviewed: 03/09/2022 Elsevier Patient Education  2024 [employer].

## 2024-02-18 ENCOUNTER — Ambulatory Visit: Payer: Self-pay

## 2024-02-18 NOTE — Telephone Encounter (Signed)
 Chief Complaint: Poison Oak rash Symptoms: Rash spreading to face and finger Frequency: Constant  Pertinent Negatives: Patient denies fever, swelling, difficult breathing, chest pain Disposition: [] ED /[] Urgent Care (no appt availability in office) / [] Appointment(In office/virtual)/ []  [facility]/ [x] Home Care/ [] Refused Recommended Disposition /[] [facility]/ []  Follow-up with PCP Additional Notes: Patient states she was seen on 02/13/24 for exposure to poison oak and was treated with a steroid injection. Patient states the rash has spread to the side of the face and finger. Patient states she is using the cream that was prescribed. Care advice was given with instructions from office visit because patient has not tried the recommendations given at the visit. Patient has a follow-up appointment scheduled 02/20/24 advised patient to keep appointment. Patient verbalized understanding.  Copied from CRM [id number]. Topic: Clinical - Red Word Triage >> Feb 18, 2024 11:13 AM Danelle Dunning F wrote: [facility] that prompted transfer to Nurse Triage: poison oak reaction is getting worst; it is now on her fingers; going up the side of her face Reason for Disposition  [1] Recent medical visit within 24 hours AND [2] condition / symptoms SAME (unchanged) AND [3] caller has additional questions triager can answer  Answer Assessment - Initial Assessment Questions 1. MAIN CONCERN OR SYMPTOM:  "What is your main concern right now?" "What question do you have?" "What's the main symptom you're worried about?" (e.g., breathing difficulty, cough, fever. pain)     Rash spreading  2. ONSET: "When did the rash  start?"     02/13/24 3. BETTER-SAME-WORSE: "Are you getting better, staying the same, or getting worse compared to how you felt at your last visit to the doctor (most recent medical visit)?"     Worse 4. VISIT DATE: "When were you seen?" (Date)     02/13/24 5. VISIT DOCTOR: "What is the name of the  doctor taking care of you now?"     Nimrod, PA 6. VISIT DIAGNOSIS:  "What was the main symptom or problem that you were seen for?" "Were you given a diagnosis?"      Poison oak  7. VISIT MEDICINES: "Did the doctor order any new medicines for you to use?" If Yes, ask: "Have you filled the prescription and started taking the medicine?"      Inject in office for steroid  8. NEXT APPOINTMENT: "Have you scheduled a follow-up appointment with your doctor?"     02/20/24 9. PAIN: "Is there any pain?" If Yes, ask: "How bad is it?"  (Scale 0-10; or mild, moderate, severe)    - NONE (0): no pain    - MILD (1-3): doesn't interfere with normal activities     - MODERATE (4-7): interferes with normal activities or awakens from sleep     - SEVERE (8-10): excruciating pain, unable to do any normal activities     No 10. FEVER: "Do you have a fever?" If Yes, ask: "What is it, how was it measured  and when did it start?"       No 11. OTHER SYMPTOMS: "Do you have any other symptoms?"       Itching, rash spreading to face  Protocols used: Recent Medical Visit for Illness Follow-up Call-A-AH

## 2024-02-20 ENCOUNTER — Ambulatory Visit (INDEPENDENT_AMBULATORY_CARE_PROVIDER_SITE_OTHER): Payer: Medicare Other | Admitting: Family Medicine

## 2024-02-20 ENCOUNTER — Encounter: Payer: Self-pay | Admitting: Family Medicine

## 2024-02-20 VITALS — BP 125/68 | HR 66 | Ht 65.0 in | Wt 172.0 lb

## 2024-02-20 DIAGNOSIS — R5383 Other fatigue: Secondary | ICD-10-CM | POA: Diagnosis not present

## 2024-02-20 DIAGNOSIS — Z23 Encounter for immunization: Secondary | ICD-10-CM

## 2024-02-20 DIAGNOSIS — G4733 Obstructive sleep apnea (adult) (pediatric): Secondary | ICD-10-CM | POA: Diagnosis not present

## 2024-02-20 DIAGNOSIS — I77819 Aortic ectasia, unspecified site: Secondary | ICD-10-CM | POA: Diagnosis not present

## 2024-02-20 DIAGNOSIS — R7301 Impaired fasting glucose: Secondary | ICD-10-CM

## 2024-02-20 DIAGNOSIS — K7581 Nonalcoholic steatohepatitis (NASH): Secondary | ICD-10-CM | POA: Diagnosis not present

## 2024-02-20 DIAGNOSIS — K7460 Unspecified cirrhosis of liver: Secondary | ICD-10-CM | POA: Diagnosis not present

## 2024-02-20 LAB — POCT GLYCOSYLATED HEMOGLOBIN (HGB A1C): Hemoglobin A1C: 5.6 % (ref 4.0–5.6)

## 2024-02-20 NOTE — Progress Notes (Signed)
 Established Patient Office Visit  Subjective  Patient ID: Theresa Scott, female    DOB: 1956/05/27  Age: 68 y.o. MRN: [id number]  Chief Complaint  Patient presents with   Diabetes    HPI  Impaired fasting glucose-no increased thirst or urination. No symptoms consistent with hypoglycemia.  Has had labs done with Dr. Loy Ruff. They are doing a repeat US  in 6 mo nad repeat labs in 3 months. Numbers have shifted.    C/O severe fatigue for the last 18 months.  Wonders if she can have Addison like her father.  Weight is stable.     ROS    Objective:     BP 125/68   Pulse 66   Ht 5\' 5"  (1.651 m)   Wt 172 lb (78 kg)   SpO2 98%   BMI 28.62 kg/m    Physical Exam Vitals and nursing note reviewed.  Constitutional:      Appearance: Normal appearance.  HENT:     Head: Normocephalic and atraumatic.  Eyes:     Conjunctiva/sclera: Conjunctivae normal.  Cardiovascular:     Rate and Rhythm: Normal rate and regular rhythm.  Pulmonary:     Effort: Pulmonary effort is normal.     Breath sounds: Normal breath sounds.  Skin:    General: Skin is warm and dry.  Neurological:     Mental Status: She is alert.  Psychiatric:        Mood and Affect: Mood normal.     Results for orders placed or performed in visit on 02/20/24  POCT HgB A1C  Result Value Ref Range   Hemoglobin A1C 5.6 4.0 - 5.6 %   HbA1c POC (<> result, manual entry)     HbA1c, POC (prediabetic range)     HbA1c, POC (controlled diabetic range)        The ASCVD Risk score (Arnett DK, et al., 2019) failed to calculate for the following reasons:   The valid total cholesterol range is 130 to 320 mg/dL    Assessment & Plan:   Problem List Items Addressed This Visit       Cardiovascular and Mediastinum   Aortic dilatation (HCC)   Rec repeat US  in 01/2027        Respiratory   OSA (obstructive sleep apnea)   And that she has been wearing her CPAP consistently she still feels extremely fatigued and has  for the last 18 months she started to wonder if it could be a medication side effect so she has actually been taking her Pristiq  every other day and her diazepam  every other day.  She is also cut back on her gabapentin  to once a day.        Digestive   Liver cirrhosis secondary to NASH [facility])   Following with Dr. Willy Harvest.        Endocrine   Impaired fasting glucose - Primary   A1C looks great at 5.6 today. Continue to work on adequate protein and vegetable intake.  Follow-up in 6 months.  Call if any concerns or problems.      Relevant Orders   POCT HgB A1C (Completed)     Other   Fatigue   She is already cut her gabapentin  down to once a day.  We did discuss discontinuing the diazepam  completely and only using as needed.  Okay to decrease frequency of the Pristiq  if she wants to try that just to see if she notices any improvement.  She also  let me know that her dad had diagnosis of Addison's disease and she wonders if she could have that as well she is also noticed a slight tremor occasionally      Relevant Orders   Cortisol-am, blood   ACTH   Other Visit Diagnoses       Encounter for immunization       Relevant Orders   Pneumococcal conjugate vaccine 20-valent (Completed)       Return in about 6 months (around 08/21/2024).    Duaine German, MD

## 2024-02-20 NOTE — Assessment & Plan Note (Signed)
 A1C looks great at 5.6 today. Continue to work on adequate protein and vegetable intake.  Follow-up in 6 months.  Call if any concerns or problems.

## 2024-02-20 NOTE — Assessment & Plan Note (Signed)
 And that she has been wearing her CPAP consistently she still feels extremely fatigued and has for the last 18 months she started to wonder if it could be a medication side effect so she has actually been taking her Pristiq  every other day and her diazepam  every other day.  She is also cut back on her gabapentin  to once a day.

## 2024-02-20 NOTE — Assessment & Plan Note (Signed)
 She is already cut her gabapentin  down to once a day.  We did discuss discontinuing the diazepam  completely and only using as needed.  Okay to decrease frequency of the Pristiq  if she wants to try that just to see if she notices any improvement.  She also let me know that her dad had diagnosis of Addison's disease and she wonders if she could have that as well she is also noticed a slight tremor occasionally

## 2024-02-20 NOTE — Assessment & Plan Note (Signed)
 Rec repeat US  in 01/2027

## 2024-02-20 NOTE — Assessment & Plan Note (Signed)
 Following with Dr. Willy Harvest.

## 2024-02-21 ENCOUNTER — Encounter: Payer: Self-pay | Admitting: Family Medicine

## 2024-02-21 LAB — CORTISOL-AM, BLOOD: Cortisol - AM: 11.9 ug/dL (ref 6.2–19.4)

## 2024-02-21 LAB — ACTH: ACTH: 22.6 pg/mL (ref 7.2–63.3)

## 2024-02-21 NOTE — Progress Notes (Signed)
 Labs ae noromal

## 2024-04-17 ENCOUNTER — Ambulatory Visit: Payer: PRIVATE HEALTH INSURANCE

## 2024-04-17 VITALS — Ht 65.0 in | Wt 180.0 lb

## 2024-04-17 DIAGNOSIS — Z0000 Encounter for general adult medical examination without abnormal findings: Secondary | ICD-10-CM

## 2024-04-17 NOTE — Patient Instructions (Signed)
  Ms. Theresa Scott , Thank you for taking time to come for your Medicare Wellness Visit. I appreciate your ongoing commitment to your health goals. Please review the following plan we discussed and let me know if I can assist you in the future.   These are the goals we discussed:  Goals       Exercise 3x per week (30 min per time)      Start exercising for 3 days at 30 minutes at a time to increase exercise.      Patient Stated (pt-stated)      01/19/2021 AWV Goal: Exercise for General Health  Patient will verbalize understanding of the benefits of increased physical activity: Exercising regularly is important. It will improve your overall fitness, flexibility, and endurance. Regular exercise also will improve your overall health. It can help you control your weight, reduce stress, and improve your bone density. Over the next year, patient will increase physical activity as tolerated with a goal of at least 150 minutes of moderate physical activity per week.  You can tell that you are exercising at a moderate intensity if your heart starts beating faster and you start breathing faster but can still hold a conversation. Moderate-intensity exercise ideas include: Walking 1 mile (1.6 km) in about 15 minutes Biking Hiking Golfing Dancing Water aerobics Patient will verbalize understanding of everyday activities that increase physical activity by providing examples like the following: Yard work, such as: Insurance underwriter Gardening Washing windows or floors Patient will be able to explain general safety guidelines for exercising:  Before you start a new exercise program, talk with your health care provider. Do not exercise so much that you hurt yourself, feel dizzy, or get very short of breath. Wear comfortable clothes and wear shoes with good support. Drink plenty of water while you exercise to prevent  dehydration or heat stroke. Work out until your breathing and your heartbeat get faster.       Patient Stated (pt-stated)      Patient stated that she would like to loose 20-30 lbs.      Patient Stated (pt-stated)      Patient stated that she would like to loose some weight.      Patient Stated      Patient states she would like to lose weight. Also wants to be able to keep up with her children.         This is a list of the screening recommended for you and due dates:  Health Maintenance  Topic Date Due   Zoster (Shingles) Vaccine (1 of 2) Never done   Hepatitis B Vaccine (3 of 3 - Risk 3-dose series) 02/05/2019   COVID-19 Vaccine (3 - Pfizer risk series) 07/29/2020   Flu Shot  05/23/2024   Yearly kidney health urinalysis for diabetes  07/01/2024   Yearly kidney function blood test for diabetes  01/27/2025   Medicare Annual Wellness Visit  04/17/2025   DTaP/Tdap/Td vaccine (2 - Td or Tdap) 11/18/2025   Mammogram  01/01/2026   DEXA scan (bone density measurement)  01/01/2026   Pneumococcal Vaccine for age over 63  Completed   Hepatitis C Screening  Completed   HPV Vaccine  Aged Out   Meningitis B Vaccine  Aged Out   Colon Cancer Screening  Discontinued

## 2024-04-17 NOTE — Progress Notes (Signed)
 Subjective:   Theresa Scott is a 68 y.o. female who presents for Medicare Annual (Subsequent) preventive examination.  Visit Complete: Virtual I connected with  Theresa Scott Orange on 04/17/24 by a audio enabled telemedicine application and verified that I am speaking with the correct person using two identifiers.  Patient Location: Home  Provider Location: Office/Clinic  I discussed the limitations of evaluation and management by telemedicine. The patient expressed understanding and agreed to proceed.  Vital Signs: Because this visit was a virtual/telehealth visit, some criteria may be missing or patient reported. Any vitals not documented were not able to be obtained and vitals that have been documented are patient reported.  Patient Medicare AWV questionnaire was completed by the patient on n/a; I have confirmed that all information answered by patient is correct and no changes since this date.  Cardiac Risk Factors include: advanced age (>76men, >68 women);obesity (BMI >30kg/m2);diabetes mellitus;hypertension;dyslipidemia;family history of premature cardiovascular disease     Objective:    Today's Vitals   04/17/24 1430  Weight: 180 lb (81.6 kg)  Height: 5' 5 (1.651 m)  PainSc: 7    Body mass index is 29.95 kg/m.     04/17/2024    2:42 PM 11/12/2023    8:27 PM 06/20/2023   10:26 AM 04/09/2023   10:56 AM 12/18/2022    8:31 PM 04/03/2022    3:13 PM 07/08/2021    4:51 PM  Advanced Directives  Does Patient Have a Medical Advance Directive? No No No No No No No  Type of Surveyor, minerals;Living will      Would patient like information on creating a medical advance directive? No - Patient declined Yes (MAU/Ambulatory/Procedural Areas - Information given) Yes (MAU/Ambulatory/Procedural Areas - Information given) No - Patient declined No - Patient declined No - Patient declined No - Patient declined    Current Medications (verified) Outpatient Encounter  Medications as of 04/17/2024  Medication Sig   alendronate  (FOSAMAX ) 70 MG tablet TAKE 1 TABLET EVERY WEEK WITH A FULL GLASS OF WATER ON AN EMPTY STOMACH.   aspirin  EC 81 MG tablet Take 1 tablet (81 mg total) by mouth daily. Swallow whole.   atorvastatin  (LIPITOR) 20 MG tablet Take 1 tablet (20 mg total) by mouth daily.   baclofen (LIORESAL) 10 MG tablet Take 10 mg by mouth daily.   buPROPion  (WELLBUTRIN  XL) 150 MG 24 hr tablet TAKE 1 TABLET EVERY DAY   desvenlafaxine  (PRISTIQ ) 50 MG 24 hr tablet TAKE 1 TABLET EVERY DAY   diazepam  (VALIUM ) 5 MG tablet Take 5 mg by mouth every 8 (eight) hours as needed.   docusate sodium (COLACE) 100 MG capsule Take 100 mg by mouth daily.   empagliflozin  (JARDIANCE ) 10 MG TABS tablet Take 1 tablet (10 mg total) by mouth daily before breakfast.   gabapentin  (NEURONTIN ) 300 MG capsule TAKE 2 CAPSULES EVERY DAY   losartan  (COZAAR ) 25 MG tablet TAKE 1 TABLET EVERY DAY   omeprazole  (PRILOSEC) 20 MG capsule Take 1 capsule (20 mg total) by mouth daily. 30 minutes before breakfast   ondansetron  (ZOFRAN -ODT) 8 MG disintegrating tablet Take 1 tablet (8 mg total) by mouth every 8 (eight) hours as needed for nausea.   Oxycodone HCl 10 MG TABS Take 10 mg by mouth 3 (three) times daily as needed (pain).   triamcinolone  cream (KENALOG ) 0.1 % Apply 1 Application topically 2 (two) times daily. As needed for poison ivy rash. (Patient not taking: Reported on  04/17/2024)   No facility-administered encounter medications on file as of 04/17/2024.    Allergies (verified) Bee venom and Latex   History: Past Medical History:  Diagnosis Date   Allergy    Anxiety    Cancer (HCC) 08-22-2022   Just found out I have cirrhosis   Cataract 2019   Chronic kidney disease    Complication of anesthesia    hard to wake up and swelling after kidney stone removal in 1980 per pt   Degenerative disc disease    Depression    Diabetes mellitus    Fatty liver disease, nonalcoholic     Gallstones    GERD (gastroesophageal reflux disease)    Hypercholesterolemia    Hypertension    Kidney stones    Liver cirrhosis secondary to NASH (HCC)    Nocturnal hypoxemia 05/23/2023   Obesity    OSA (obstructive sleep apnea) 11/22/2023   mild obstructive sleep apnea with an AHI of 9.5/h overall but moderate during REM sleep with a REM AHI of 16.0/h.   Osteoporosis    Pancreatitis    SOB (shortness of breath)    Swelling of both lower extremities    Past Surgical History:  Procedure Laterality Date   BACK SURGERY  1990,2003,2004,2010   BREAST SURGERY     CARDIAC CATHETERIZATION  2011   [facility].    CARDIAC SURGERY  2002   ablation   CHOLECYSTECTOMY  1976   COLONOSCOPY  2008   ESOPHAGOGASTRODUODENOSCOPY N/A 01/27/2013   Procedure: ESOPHAGOGASTRODUODENOSCOPY (EGD) upper endoscopy;  Surgeon: Alm VEAR Angle, MD;  Location: [facility];  Service: General;  Laterality: N/A;   EYE SURGERY     GASTRIC BYPASS  08/22/2010   HAND SURGERY  2000   right   SPINE SURGERY  1990,2001,2004,2005,2015   SUBTHALAMIC STIMULATOR BATTERY REPLACEMENT  08/16/2020   TEE WITHOUT CARDIOVERSION N/A 03/07/2023   Procedure: TRANSESOPHAGEAL ECHOCARDIOGRAM;  Surgeon: Alvan Ronal BRAVO, MD;  Location: [facility];  Service: Cardiovascular;  Laterality: N/A;   TONSILLECTOMY  1971   Family History  Problem Relation Age of Onset   Lung cancer Mother    Alcohol abuse Mother    Cancer Mother    COPD Mother    Miscarriages / [place] Mother    Addison's disease Father    Cancer Sister    Diabetes Sister    COPD Sister    Heart attack Sister    Lung cancer Sister    Cirrhosis Sister        secondary medication   Diabetes Sister    Colon cancer Brother        late 68's   Alcohol abuse Brother    Cancer Brother    Liver cancer Brother    Alcohol abuse Brother    Cancer Brother    COPD Brother    Kidney failure Brother        Dialysis at 81, born single kidney   Alcohol abuse  Daughter    Esophageal cancer Neg Hx    Stomach cancer Neg Hx    Rectal cancer Neg Hx    Social History   Socioeconomic History   Marital status: Married    Spouse name: Johnie   Number of children: 4   Years of education: 9   Highest education level: GED or equivalent  Occupational History   Occupation: [employer]    Comment: retired  Tobacco Use   Smoking status: Former    Current packs/day: 0.00  Average packs/day: 0.5 packs/day for 18.0 years (9.0 ttl pk-yrs)    Types: Cigarettes    Start date: 08/04/1972    Quit date: 08/04/1990    Years since quitting: 33.7   Smokeless tobacco: Never  Vaping Use   Vaping status: Never Used  Substance and Sexual Activity   Alcohol use: No   Drug use: No   Sexual activity: Yes    Birth control/protection: None  Other Topics Concern   Not on file  Social History Narrative   Married 1 daughter (41) and 3 adopted children   Disabled, chronic back pain, 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   She enjoys crocheting       Social Drivers of Corporate investment banker Strain: Low Risk  (04/17/2024)   Overall Financial Resource Strain (CARDIA)    Difficulty of Paying Living Expenses: Not hard at all  Food Insecurity: No Food Insecurity (04/17/2024)   Hunger Vital Sign    Worried About Running Out of Food in the Last Year: Never true    Ran Out of Food in the Last Year: Never true  Transportation Needs: No Transportation Needs (04/17/2024)   PRAPARE - Administrator, Civil Service (Medical): No    Lack of Transportation (Non-Medical): No  Physical Activity: Insufficiently Active (04/17/2024)   Exercise Vital Sign    Days of Exercise per Week: 7 days    Minutes of Exercise per Session: 10 min  Stress: Stress Concern Present (04/17/2024)   [employer] of Occupational Health - Occupational Stress Questionnaire    Feeling of Stress: To some extent  Social Connections: Socially Integrated (04/17/2024)    Social Connection and Isolation Panel    Frequency of Communication with Friends and Family: More than three times a week    Frequency of Social Gatherings with Friends and Family: More than three times a week    Attends Religious Services: More than 4 times per year    Active Member of [employer] or Organizations: Yes    Attends Engineer, structural: More than 4 times per year    Marital Status: Married    Tobacco Counseling Counseling given: Not Answered   Clinical Intake:  Pre-visit preparation completed: Yes  Pain : 0-10 Pain Score: 7  Pain Type: Chronic pain Pain Location: Back Pain Orientation: Lower Pain Descriptors / Indicators: Constant Pain Onset: More than a month ago Pain Frequency: Constant     BMI - recorded: 29.95 Nutritional Status: BMI 25 -29 Overweight Nutritional Risks: None Diabetes: Yes CBG done?: No Did pt. bring in CBG monitor from home?: No  How often do you need to have someone help you when you read instructions, pamphlets, or other written materials from your doctor or pharmacy?: 1 - Never What is the last grade level you completed in school?: 12  Interpreter Needed?: No      Activities of Daily Living    04/17/2024    2:32 PM  In your present state of health, do you have any difficulty performing the following activities:  Hearing? 0  Vision? 0  Difficulty concentrating or making decisions? 0  Walking or climbing stairs? 0  Dressing or bathing? 0  Doing errands, shopping? 0  Preparing Food and eating ? N  Using the Toilet? N  In the past six months, have you accidently leaked urine? Y  Do you have problems with loss of bowel control? N  Managing your Medications? N  Managing your Finances? N  Housekeeping  or managing your Housekeeping? N    Patient Care Team: Alvan Dorothyann BIRCH, MD as PCP - General (Family Medicine) Thukkani, Arun K, MD as PCP - Cardiology (Cardiology) Avram Lupita BRAVO, MD as Consulting Physician  (Gastroenterology) Gust Royden ORN, MD as Consulting Physician (Orthopedic Surgery) Shlomo Wilbert SAUNDERS, MD as Consulting Physician (Cardiology)  Indicate any recent Medical Services you may have received from other than [facility] providers in the past year (date may be approximate).     Assessment:   This is a routine wellness examination for Theresa Scott.  Hearing/Vision screen No results found.   Goals Addressed             This Visit's Progress    Patient Stated       Patient states she would like to lose weight. Also wants to be able to keep up with her children.        Depression Screen    04/17/2024    2:40 PM 02/20/2024    9:38 AM 04/09/2023   10:57 AM 07/31/2022    8:41 AM 04/03/2022    3:13 PM 12/19/2021    3:41 PM 03/03/2021    9:56 AM  PHQ 2/9 Scores  PHQ - 2 Score 0 0 0 2 0 2 2  PHQ- 9 Score  3  8  11 5     Fall Risk    04/17/2024    2:46 PM 02/20/2024    9:38 AM 04/09/2023   10:57 AM 04/04/2023    9:21 AM 07/03/2022    9:58 AM  Fall Risk   Falls in the past year? 0 0 0 0 0  Number falls in past yr: 0 0 0  0  Injury with Fall? 0 0 0 0 0  Risk for fall due to : No Fall Risks No Fall Risks No Fall Risks  No Fall Risks  Follow up Falls evaluation completed Falls evaluation completed Falls evaluation completed  Falls evaluation completed      Data saved with a previous flowsheet row definition    MEDICARE RISK AT HOME: Medicare Risk at Home Any stairs in or around the home?: Yes If so, are there any without handrails?: Yes Home free of loose throw rugs in walkways, pet beds, electrical cords, etc?: Yes Adequate lighting in your home to reduce risk of falls?: Yes Life alert?: No Use of a cane, walker or w/c?: No Grab bars in the bathroom?: No Shower chair or bench in shower?: No Elevated toilet seat or a handicapped toilet?: No  TIMED UP AND GO:  Was the test performed?  No    Cognitive Function:        04/17/2024    2:46 PM 04/09/2023   11:00 AM 04/03/2022     3:17 PM 01/19/2021    9:26 AM 06/17/2019   11:07 AM  6CIT Screen  What Year? 0 points 0 points 0 points 0 points 0 points  What month? 0 points 0 points 0 points 0 points 0 points  What time? 0 points 0 points 0 points 0 points 0 points  Count back from 20 0 points 0 points 0 points 0 points 0 points  Months in reverse 0 points 0 points 0 points 0 points 0 points  Repeat phrase 2 points 2 points 0 points 0 points 2 points  Total Score 2 points 2 points 0 points 0 points 2 points    Immunizations Immunization History  Administered Date(s) Administered   Fluad Quad(high Dose 65+)  07/03/2022   Fluad Trivalent(High Dose 65+) 07/02/2023   Hep A / Hep B 08/06/2018, 11/27/2018   Influenza Split 08/15/2011, 09/09/2012   Influenza Whole 07/20/2008, 08/12/2009, 08/03/2010   Influenza, High Dose Seasonal PF 07/18/2017, 06/07/2019   Influenza,inj,Quad PF,6+ Mos 06/26/2013, 09/15/2014, 07/01/2015, 08/21/2016, 08/06/2018, 06/07/2019   PFIZER(Purple Top)SARS-COV-2 Vaccination 06/10/2020, 07/01/2020   PNEUMOCOCCAL CONJUGATE-20 02/20/2024   Pneumococcal Polysaccharide-23 08/06/2018   Pneumococcal-Unspecified 10/23/2001   Tdap 11/19/2015    TDAP status: Up to date  Flu Vaccine status: Up to date  Pneumococcal vaccine status: Up to date  Covid-19 vaccine status: Completed vaccines  Qualifies for Shingles Vaccine? Yes   Zostavax completed No   Shingrix Completed?: No.    Education has been provided regarding the importance of this vaccine. Patient has been advised to call insurance company to determine out of pocket expense if they have not yet received this vaccine. Advised may also receive vaccine at local pharmacy or Health Dept. Verbalized acceptance and understanding.  Screening Tests Health Maintenance  Topic Date Due   Zoster Vaccines- Shingrix (1 of 2) Never done   Hepatitis B Vaccines (3 of 3 - Risk 3-dose series) 02/05/2019   COVID-19 Vaccine (3 - Pfizer risk series) 07/29/2020    INFLUENZA VACCINE  05/23/2024   Diabetic kidney evaluation - Urine ACR  07/01/2024   Diabetic kidney evaluation - eGFR measurement  01/27/2025   Medicare Annual Wellness (AWV)  04/17/2025   DTaP/Tdap/Td (2 - Td or Tdap) 11/18/2025   MAMMOGRAM  01/01/2026   DEXA SCAN  01/01/2026   Pneumococcal Vaccine: 50+ Years  Completed   Hepatitis C Screening  Completed   HPV VACCINES  Aged Out   Meningococcal B Vaccine  Aged Out   Colonoscopy  Discontinued    Health Maintenance  Health Maintenance Due  Topic Date Due   Zoster Vaccines- Shingrix (1 of 2) Never done   Hepatitis B Vaccines (3 of 3 - Risk 3-dose series) 02/05/2019   COVID-19 Vaccine (3 - Pfizer risk series) 07/29/2020    Colorectal cancer screening: Type of screening: Colonoscopy. Completed 12/20/2017. Repeat every 10 years  Mammogram status: Completed 01/02/2024. Repeat every year  Bone Density status: Completed 01/02/2024. Results reflect: Bone density results: OSTEOPOROSIS. Repeat every 2 years.  Lung Cancer Screening: (Low Dose CT Chest recommended if Age 2-80 years, 20 pack-year currently smoking OR have quit w/in 15years.) does not qualify.   Lung Cancer Screening Referral: n/a  Additional Screening:  Hepatitis C Screening: does qualify; Completed 07/03/2022  Vision Screening: Recommended annual ophthalmology exams for early detection of glaucoma and other disorders of the eye. Is the patient up to date with their annual eye exam?  Yes  Who is the provider or what is the name of the office in which the patient attends annual eye exams? [facility] If pt is not established with a provider, would they like to be referred to a provider to establish care? N/a.   Dental Screening: Recommended annual dental exams for proper oral hygiene   Community Resource Referral / Chronic Care Management: CRR required this visit?  No   CCM required this visit?  No     Plan:     I have personally reviewed and noted the  following in the patient's chart:   Medical and social history Use of alcohol, tobacco or illicit drugs  Current medications and supplements including opioid prescriptions. Patient is currently taking opioid prescriptions. Information provided to patient regarding non-opioid alternatives. Patient advised to discuss non-opioid  treatment plan with their provider. Functional ability and status Nutritional status Physical activity Advanced directives List of other physicians Hospitalizations, surgeries, and ER visits in previous 12 months. None Vitals Screenings to include cognitive, depression, and falls Referrals and appointments  In addition, I have reviewed and discussed with patient certain preventive protocols, quality metrics, and best practice recommendations. A written personalized care plan for preventive services as well as general preventive health recommendations were provided to patient.     Bonny Jon Mayor, CMA   04/17/2024   After Visit Summary: (MyChart) Due to this being a telephonic visit, the after visit summary with patients personalized plan was offered to patient via MyChart   Nurse Notes:   Theresa Scott is a 68 y.o. female patient of [name], Dorothyann JONETTA, MD who had a Medicare Annual Wellness Visit today via telephone. Theresa Scott is Retired and lives with their family. She has 4 children. She reports that she is socially active and does interact with friends/family regularly. She is moderately physically active and enjoys corcheting.

## 2024-04-21 ENCOUNTER — Telehealth: Payer: Self-pay

## 2024-04-21 DIAGNOSIS — K7460 Unspecified cirrhosis of liver: Secondary | ICD-10-CM

## 2024-04-21 NOTE — Telephone Encounter (Signed)
 AFP ordered and I spoke with Consuelo who said she has it marked on her calendar as well to come early July.

## 2024-04-21 NOTE — Telephone Encounter (Signed)
-----   Message from [facility] J sent at 02/01/2024  6:14 PM EDT ----- Order AFP and remind patient to come and get drawn in July.

## 2024-05-06 ENCOUNTER — Ambulatory Visit (INDEPENDENT_AMBULATORY_CARE_PROVIDER_SITE_OTHER): Payer: PRIVATE HEALTH INSURANCE | Admitting: Family Medicine

## 2024-05-06 VITALS — BP 135/74 | HR 71 | Ht 65.0 in | Wt 174.0 lb

## 2024-05-06 DIAGNOSIS — R21 Rash and other nonspecific skin eruption: Secondary | ICD-10-CM | POA: Diagnosis not present

## 2024-05-06 MED ORDER — DOXYCYCLINE HYCLATE 100 MG PO TABS: 100.0000 mg | ORAL_TABLET | Freq: Two times a day (BID) | ORAL | 0 refills | Status: DC

## 2024-05-06 NOTE — Addendum Note (Signed)
 Addended by: FREYA BASCOM CROME on: 05/06/2024 02:08 PM   Modules accepted: Orders

## 2024-05-06 NOTE — Progress Notes (Deleted)
 Cardiology Office Note    Patient Name: Theresa Scott Date of Encounter: 05/06/2024  Primary Care Provider:  Alvan Dorothyann JONETTA, MD Primary Cardiologist:  Lurena MARLA Red, MD Primary Electrophysiologist: None   Past Medical History    Past Medical History:  Diagnosis Date   Allergy    Anxiety    Cancer [facility]) 08-22-2022   Just found out I have cirrhosis   Cataract 2019   Chronic kidney disease    Complication of anesthesia    hard to wake up and swelling after kidney stone removal in 1980 per pt   Degenerative disc disease    Depression    Diabetes mellitus    Fatty liver disease, nonalcoholic    Gallstones    GERD (gastroesophageal reflux disease)    Hypercholesterolemia    Hypertension    Kidney stones    Liver cirrhosis secondary to NASH (HCC)    Nocturnal hypoxemia 05/23/2023   Obesity    OSA (obstructive sleep apnea) 11/22/2023   mild obstructive sleep apnea with an AHI of 9.5/h overall but moderate during REM sleep with a REM AHI of 16.0/h.   Osteoporosis    Pancreatitis    SOB (shortness of breath)    Swelling of both lower extremities     History of Present Illness  Theresa Scott is a 68 y.o. female with a PMH of***    Patient denies chest pain, palpitations, dyspnea, PND, orthopnea, nausea, vomiting, dizziness, syncope, edema, weight gain, or early satiety.   Discussed the use of AI scribe software for clinical note transcription with the patient, who gave verbal consent to proceed.  History of Present Illness    ***Notes:   Review of Systems  Please see the history of present illness.    All other systems reviewed and are otherwise negative except as noted above.  Physical Exam    Wt Readings from Last 3 Encounters:  05/06/24 174 lb 0.6 oz (78.9 kg)  04/17/24 180 lb (81.6 kg)  02/20/24 172 lb (78 kg)   CD:Uyzmz were no vitals filed for this visit.,There is no height or weight on file to calculate BMI. GEN: Well nourished, well  developed in no acute distress Neck: No JVD; No carotid bruits Pulmonary: Clear to auscultation without rales, wheezing or rhonchi  Cardiovascular: Normal rate. Regular rhythm. Normal S1. Normal S2.   Murmurs: There is no murmur.  ABDOMEN: Soft, non-tender, non-distended EXTREMITIES:  No edema; No deformity   EKG/LABS/ Recent Cardiac Studies   ECG personally reviewed by me today - ***  Risk Assessment/Calculations:   {Does this patient have ATRIAL FIBRILLATION?[phone]}      Lab Results  Component Value Date   WBC 4.2 01/28/2024   HGB 14.0 01/28/2024   HCT 42.6 01/28/2024   MCV 92.1 01/28/2024   PLT 129.0 (L) 01/28/2024   Lab Results  Component Value Date   CREATININE 0.68 01/28/2024   BUN 15 01/28/2024   NA 141 01/28/2024   K 3.6 01/28/2024   CL 108 01/28/2024   CO2 24 01/28/2024   Lab Results  Component Value Date   CHOL 76 (L) 10/02/2023   HDL 38 (L) 10/02/2023   LDLCALC 23 10/02/2023   TRIG 62 10/02/2023   CHOLHDL 2.0 10/02/2023    Lab Results  Component Value Date   HGBA1C 5.6 02/20/2024   Assessment & Plan    Assessment and Plan Assessment & Plan     1.***  2.***  3.***  4.***  Disposition: Follow-up with Arun K Thukkani, MD or APP in *** months {Are you ordering a CV Procedure (e.g. stress test, cath, DCCV, TEE, etc)?   Press F2        :[id number]}   Signed, Wyn Raddle, Jackee Shove, NP 05/06/2024, 12:36 PM [facility]

## 2024-05-06 NOTE — Progress Notes (Signed)
   Acute Office Visit  Subjective:     Patient ID: Theresa Scott, female    DOB: 10/01/1956, 68 y.o.   MRN: [id number]  Chief Complaint  Patient presents with   Rash    Rash is on L side of face and L side neck at hairline. She had been using Triamcinolone  cream. She reports that the area is not itchy but it is sore. It has been going on for months.     HPI Patient is in today for   Rash is on L side of face and L side neck at hairline. She had been using Triamcinolone  cream. She reports that the area is not itchy but it is sore. It has been going on for months.  The rash first started back in April and she came in to have it evaluated it looked very much like poison ivy dermatitis and was treated as such.  She says that it never went completely away.  It still feels a little itchy and irritated it feels sore to touch.  She has been putting Neosporin on it daily.  She has been scratching at it.     ROS      Objective:    BP 135/74   Pulse 71   Ht 5' 5 (1.651 m)   Wt 174 lb 0.6 oz (78.9 kg)   SpO2 97%   BMI 28.96 kg/m    Physical Exam Skin:    Comments: Erythematous papular rash on the posterior left neck has a scab in the center and then she also has a small scab on her left lateral jaw area.     No results found for any visits on 05/06/24.      Assessment & Plan:   Problem List Items Addressed This Visit   None Visit Diagnoses       Rash    -  Primary       Rash on the left side of her neck and a little bit on that left jaw area.  We discussed avoiding scratching and picking at the area avoid removing the scab.  Discontinue Neosporin and just use Vaseline to moisturize the area.  We did discuss how long use of Neosporin can actually cause a contact dermatitis.  I did do a skin scraping to rule out fungal elements.  In the short-term I am going to go ahead and put her on doxycycline  while we are waiting for that to come back.  Meds ordered this encounter   Medications   doxycycline  (VIBRA -TABS) 100 MG tablet    Sig: Take 1 tablet (100 mg total) by mouth 2 (two) times daily.    Dispense:  14 tablet    Refill:  0    No follow-ups on file.  Dorothyann Byars, MD

## 2024-05-08 ENCOUNTER — Ambulatory Visit: Payer: PRIVATE HEALTH INSURANCE | Admitting: Nurse Practitioner

## 2024-05-08 LAB — FUNGUS STAIN

## 2024-05-09 ENCOUNTER — Ambulatory Visit: Payer: Self-pay | Admitting: Family Medicine

## 2024-05-09 DIAGNOSIS — R21 Rash and other nonspecific skin eruption: Secondary | ICD-10-CM

## 2024-05-09 MED ORDER — MUPIROCIN 2 % EX OINT: TOPICAL_OINTMENT | Freq: Two times a day (BID) | CUTANEOUS | 0 refills | Status: DC

## 2024-05-09 NOTE — Progress Notes (Signed)
 Hi Charlene, skin scraping shows NO fungus. So I will send  over an ointment to see if helping.  Will send over mupiricin ointment.

## 2024-05-13 ENCOUNTER — Other Ambulatory Visit (INDEPENDENT_AMBULATORY_CARE_PROVIDER_SITE_OTHER)

## 2024-05-13 DIAGNOSIS — K7581 Nonalcoholic steatohepatitis (NASH): Secondary | ICD-10-CM | POA: Diagnosis not present

## 2024-05-13 DIAGNOSIS — K7460 Unspecified cirrhosis of liver: Secondary | ICD-10-CM | POA: Diagnosis not present

## 2024-05-14 ENCOUNTER — Other Ambulatory Visit: Payer: Self-pay | Admitting: Internal Medicine

## 2024-05-14 ENCOUNTER — Encounter: Payer: Self-pay | Admitting: Physician Assistant

## 2024-05-14 ENCOUNTER — Other Ambulatory Visit: Payer: Self-pay | Admitting: Medical Genetics

## 2024-05-14 ENCOUNTER — Ambulatory Visit: Payer: PRIVATE HEALTH INSURANCE | Attending: Physician Assistant | Admitting: Physician Assistant

## 2024-05-14 VITALS — BP 158/80 | HR 70 | Ht 65.0 in | Wt 171.6 lb

## 2024-05-14 DIAGNOSIS — R002 Palpitations: Secondary | ICD-10-CM

## 2024-05-14 DIAGNOSIS — I480 Paroxysmal atrial fibrillation: Secondary | ICD-10-CM | POA: Diagnosis not present

## 2024-05-14 DIAGNOSIS — I340 Nonrheumatic mitral (valve) insufficiency: Secondary | ICD-10-CM

## 2024-05-14 DIAGNOSIS — E785 Hyperlipidemia, unspecified: Secondary | ICD-10-CM | POA: Diagnosis not present

## 2024-05-14 DIAGNOSIS — I10 Essential (primary) hypertension: Secondary | ICD-10-CM | POA: Insufficient documentation

## 2024-05-14 DIAGNOSIS — E1159 Type 2 diabetes mellitus with other circulatory complications: Secondary | ICD-10-CM

## 2024-05-14 DIAGNOSIS — K7581 Nonalcoholic steatohepatitis (NASH): Secondary | ICD-10-CM | POA: Diagnosis not present

## 2024-05-14 DIAGNOSIS — E1169 Type 2 diabetes mellitus with other specified complication: Secondary | ICD-10-CM | POA: Insufficient documentation

## 2024-05-14 DIAGNOSIS — E119 Type 2 diabetes mellitus without complications: Secondary | ICD-10-CM

## 2024-05-14 DIAGNOSIS — R400 Somnolence: Secondary | ICD-10-CM

## 2024-05-14 DIAGNOSIS — R072 Precordial pain: Secondary | ICD-10-CM

## 2024-05-14 DIAGNOSIS — K7460 Unspecified cirrhosis of liver: Secondary | ICD-10-CM

## 2024-05-14 DIAGNOSIS — I471 Supraventricular tachycardia, unspecified: Secondary | ICD-10-CM

## 2024-05-14 DIAGNOSIS — I2510 Atherosclerotic heart disease of native coronary artery without angina pectoris: Secondary | ICD-10-CM | POA: Diagnosis not present

## 2024-05-14 LAB — AFP TUMOR MARKER: AFP-Tumor Marker: 2.5 ng/mL

## 2024-05-14 NOTE — Patient Instructions (Signed)
 Medication Instructions:  IF SYSTOLIC BLOOD PRESSURE IS PERSISTENTLY GREATER THAN 140 MAY DOUBLE UP ON LOSARTAN ; GIVE OFFICE A CALL TO SEND IN NEW PRESCRIPTION *If you need a refill on your cardiac medications before your next appointment, please call your pharmacy*  Lab Work: NO LABS If you have labs (blood work) drawn today and your tests are completely normal, you will receive your results only by: MyChart Message (if you have MyChart) OR A paper copy in the mail If you have any lab test that is abnormal or we need to change your treatment, we will call you to review the results.  Testing/Procedures: NO TESTING  Follow-Up: At [facility], you and your health needs are our priority.  As part of our continuing mission to provide you with exceptional heart care, our providers are all part of one team.  This team includes your primary Cardiologist (physician) and Advanced Practice Providers or APPs (Physician Assistants and Nurse Practitioners) who all work together to provide you with the care you need, when you need it.  Your next appointment:   6 month(s)  Provider:   Arun K Thukkani, MD

## 2024-05-14 NOTE — Progress Notes (Unsigned)
 Cardiology Office Note   Date:  05/16/2024  ID:  Theresa Scott, DOB March 29, 1956, MRN [id number] PCP: Alvan Dorothyann JONETTA, MD  [facility] Providers Cardiologist:  Lurena MARLA Red, MD     History of Present Illness Theresa Scott is a 68 y.o. 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  Echocardiogram obtained on 09/11/2022 showed EF 60 to 65%, mild concentric LVH, grade 1 DD, normal RV, moderate to severe MR. TEE performed on 03/07/2023 showed EF 60 to 65%, no regional wall motion abnormality, no evidence of mitral valve regurgitation. Coronary CTA obtained on 03/12/2023 showed mild nonobstructive CAD with 1 to 24% stenosis in proximal LAD, 1 to 24% stenosis in proximal left circumflex artery, 25 to 49% stenosis in mid RCA.  Heart monitor in June 2024 demonstrated no atrial fibrillation.  CPAP therapy managed by Dr. Shlomo.  She was last seen by Dr. Shlomo in April 2025.  Patient presents today for follow-up.  She has chronic fatigue which has been unchanged.  She has good days and bad days.  She will continue to follow-up with GI service for NASH cirrhosis.  Blood pressure is elevated today at 158/80.  However it was 135/74 about a week ago during PCP visit.  I asked her to monitor her blood pressure more closely at home, if systolic blood pressures persistently over 140, she should give us  a call and let us  know so we can send in a new prescription, I would recommend increase losartan  to 50 mg daily.  Otherwise, she can follow-up with Dr. Red in 74-month   ROS:   She denies chest pain, palpitations, dyspnea, pnd, orthopnea, n, v, dizziness, syncope, edema, weight gain, or early satiety. All other systems reviewed and are otherwise negative except as noted above.    Studies Reviewed EKG Interpretation Date/Time:  Wednesday May 14 2024 08:49:41 EDT Ventricular Rate:   61 PR Interval:  172 QRS Duration:  86 QT Interval:  442 QTC Calculation: 444 R Axis:   27  Text Interpretation: Normal sinus rhythm no significant ST-T wave changes Confirmed by Janene Boer [phone]) on 05/14/2024 9:40:43 AM    Cardiac Studies & Procedures   ______________________________________________________________________________________________   STRESS TESTS  NM MYOCAR MULTI W/SPECT W 01/27/2010  Narrative Clinical Data:  Atypical chest pain  MYOCARDIAL IMAGING WITH SPECT (REST AND PHARMACOLOGIC-STRESS) GATED LEFT VENTRICULAR WALL MOTION STUDY LEFT VENTRICULAR EJECTION FRACTION  Technique:  Standard myocardial SPECT imaging was performed after resting intravenous injection of 30 mCi Tc-2m tetrofosmin. Subsequently, intravenous infusion of regadenoson was performed under the supervision of the Cardiology staff.  At peak effect of the drug, 30 mCi Tc-44m tetrofosmin was injected intravenously and standard myocardial SPECT  imaging was performed.  Of note, a 2 day study was done due to the patient's size.  Quantitative gated imaging was also performed to evaluate left ventricular wall motion, and estimate left ventricular ejection fraction.  Comparison: None.  Findings: Planar images showed prominent breast artifact.  There was some difference in breast position on the two images (done on different days).  Gated images showed EF 58% and no wall motion abnormalities.  Perfusion images showed a small, mild perfusion defect in the mid anterior wall on stress that was not present at rest.  SDS = 2.  IMPRESSION: Normal LV systolic function and wall motion.  Small, mild mid anterior  reversible perfusion defect.  This may be due to shifting breast artifact (this was a two day study and breast position does appear different on planar images).  Cannot completely rule out ischemia but this is a low risk study.  Would plan medical management with close followup.  Provider:  Newell Lesches, Amber Shereen Hermanns Stedge   ECHOCARDIOGRAM  ECHOCARDIOGRAM COMPLETE 09/11/2022  Narrative ECHOCARDIOGRAM REPORT    Patient Name:   Theresa Scott Date of Exam: 09/11/2022 Medical Rec #:  [id number]     Height:       65.0 in Accession #:    [id number]    Weight:       180.8 lb Date of Birth:  Feb 17, 1956     BSA:          1.895 m Patient Age:    66 years      BP:           112/72 mmHg Patient Gender: F             HR:           66 bpm. Exam Location:  [address]  Procedure: 2D Echo, Cardiac Doppler and Color Doppler  Indications:    R60.9 Edema  History:        Patient has prior history of Echocardiogram examinations, most recent 01/25/2010. Signs/Symptoms:Edema; Risk Factors:Hypertension, Diabetes, Dyslipidemia and Obesity.  Sonographer:    Elsie Bohr RDCS Referring Phys: [id number] ARUN K THUKKANI  IMPRESSIONS   1. Left ventricular ejection fraction, by estimation, is 60 to 65%. The left ventricle has normal function. The left ventricle has no regional wall motion abnormalities. There is mild concentric left ventricular hypertrophy. Left ventricular diastolic parameters are consistent with Grade I diastolic dysfunction (impaired relaxation). 2. Right ventricular systolic function is normal. The right ventricular size is normal. 3. Right atrial size was mildly dilated. 4. The mitral valve is normal in structure. Moderate to severe mitral valve regurgitation. No evidence of mitral stenosis. 5. The aortic valve is normal in structure. Aortic valve regurgitation is not visualized. No aortic stenosis is present. 6. The inferior vena cava is normal in size with greater than 50% respiratory variability, suggesting right atrial pressure of 3 mmHg.  FINDINGS Left Ventricle: Left ventricular ejection fraction, by estimation, is 60 to 65%. The left ventricle has normal function. The left ventricle has no regional wall motion abnormalities. The left ventricular internal  cavity size was normal in size. There is mild concentric left ventricular hypertrophy. Left ventricular diastolic parameters are consistent with Grade I diastolic dysfunction (impaired relaxation).  Right Ventricle: The right ventricular size is normal. No increase in right ventricular wall thickness. Right ventricular systolic function is normal.  Left Atrium: Left atrial size was normal in size.  Right Atrium: Right atrial size was mildly dilated.  Pericardium: There is no evidence of pericardial effusion. Presence of epicardial fat layer.  Mitral Valve: The mitral valve is normal in structure. Moderate to severe mitral valve regurgitation. No evidence of mitral valve stenosis.  Tricuspid Valve: The tricuspid valve is normal in structure. Tricuspid valve regurgitation is not demonstrated. No evidence of tricuspid stenosis.  Aortic Valve: The aortic valve is normal in structure. Aortic valve regurgitation is not visualized. Aortic regurgitation PHT measures 499 msec. No aortic stenosis is present.  Pulmonic Valve: The pulmonic valve was normal in structure. Pulmonic valve regurgitation is trivial. No evidence of pulmonic stenosis.  Aorta: The aortic root is normal in size and structure.  Venous: The inferior vena cava is normal in size with greater than 50% respiratory variability, suggesting right atrial pressure of 3 mmHg.  IAS/Shunts: No atrial level shunt detected by color flow Doppler.   LEFT VENTRICLE PLAX 2D LVIDd:         3.70 cm   Diastology LVIDs:         2.60 cm   LV e' medial:    5.98 cm/s LV PW:         1.00 cm   LV E/e' medial:  15.3 LV IVS:        1.40 cm   LV e' lateral:   9.25 cm/s LVOT diam:     2.00 cm   LV E/e' lateral: 9.9 LV SV:         67 LV SV Index:   35 LVOT Area:     3.14 cm   RIGHT VENTRICLE             IVC RV S prime:     12.90 cm/s  IVC diam: 0.90 cm TAPSE (M-mode): 2.4 cm RVSP:           29.0 mmHg  LEFT ATRIUM           Index        RIGHT  ATRIUM           Index LA diam:      3.50 cm 1.85 cm/m   RA Pressure: 3.00 mmHg LA Vol (A2C): 57.0 ml 30.08 ml/m  RA Area:     10.80 cm LA Vol (A4C): 64.7 ml 34.14 ml/m  RA Volume:   26.60 ml  14.04 ml/m AORTIC VALVE LVOT Vmax:   91.00 cm/s LVOT Vmean:  67.500 cm/s LVOT VTI:    0.214 m AI PHT:      499 msec  AORTA Ao Root diam: 3.40 cm Ao Asc diam:  3.20 cm  MITRAL VALVE                TRICUSPID VALVE MV Area (PHT): 2.96 cm     TR Peak grad:   26.0 mmHg MV Decel Time: 256 msec     TR Vmax:        255.00 cm/s MV E velocity: 91.70 cm/s   Estimated RAP:  3.00 mmHg MV A velocity: 105.00 cm/s  RVSP:           29.0 mmHg MV E/A ratio:  0.87 SHUNTS Systemic VTI:  0.21 m Systemic Diam: 2.00 cm  Kardie Tobb DO Electronically signed by Dub Huntsman DO Signature Date/Time: 09/11/2022/3:46:41 PM    Final   TEE  ECHO TEE 03/07/2023  Narrative TRANSESOPHOGEAL ECHO REPORT    Patient Name:   KAITYLN KALLSTROM Date of Exam: 03/07/2023 Medical Rec #:  [id number]     Height:       65.0 in Accession #:    [id number]    Weight:       187.0 lb Date of Birth:  October 10, 1956     BSA:          1.922 m Patient Age:    66 years      BP:           125/102 mmHg Patient Gender: F             HR:           84 bpm. Exam Location:  Inpatient  Procedure: Transesophageal Echo, Color Doppler and Cardiac Doppler  Indications:  Mitral Regurgitation i34.0  History:         Patient has prior history of Echocardiogram examinations, most recent 09/11/2022. Risk Factors:Hypertension, Diabetes and Dyslipidemia.  Sonographer:     Damien Senior RDCS Referring Phys:  [id number] RONAL BRAVO BRANCH Diagnosing Phys: Mary Branch  PROCEDURE: After discussion of the risks and benefits of a TEE, an informed consent was obtained from the patient. The transesophogeal probe was passed without difficulty through the esophogus of the patient. Sedation performed by different physician. The patient was monitored while under  deep sedation. Anesthestetic sedation was provided intravenously by Anesthesiology: 141mg  of Propofol , 20mg  of Lidocaine . The patient developed no complications during the procedure.  IMPRESSIONS   1. Left ventricular ejection fraction, by estimation, is 60 to 65%. The left ventricle has normal function. The left ventricle has no regional wall motion abnormalities. 2. Right ventricular systolic function is normal. The right ventricular size is normal. 3. No left atrial/left atrial appendage thrombus was detected. 4. The mitral valve is normal in structure. No evidence of mitral valve regurgitation. 5. The aortic valve is normal in structure. Aortic valve regurgitation is not visualized.  Conclusion(s)/Recommendation(s): No mitral valve pathology.  FINDINGS Left Ventricle: Left ventricular ejection fraction, by estimation, is 60 to 65%. The left ventricle has normal function. The left ventricle has no regional wall motion abnormalities. The left ventricular internal cavity size was normal in size.  Right Ventricle: The right ventricular size is normal. Right ventricular systolic function is normal.  Left Atrium: No left atrial/left atrial appendage thrombus was detected.  Right Atrium: Prominent Eustachian valve.  Pericardium: There is no evidence of pericardial effusion.  Mitral Valve: The mitral valve is normal in structure. No evidence of mitral valve regurgitation.  Tricuspid Valve: The tricuspid valve is normal in structure. Tricuspid valve regurgitation is trivial.  Aortic Valve: The aortic valve is normal in structure. Aortic valve regurgitation is not visualized.  Pulmonic Valve: The pulmonic valve was normal in structure. Pulmonic valve regurgitation is not visualized.  IAS/Shunts: No atrial level shunt detected by color flow Doppler.  Additional Comments: Spectral Doppler performed.  RONAL Ross Electronically signed by RONAL Ross Signature Date/Time: 03/07/2023/9:52:16  AM    Final  MONITORS  LONG TERM MONITOR (3-14 DAYS) 03/28/2023  Narrative Patch Wear Time:  6 days and 12 hours (2024-05-20T22:51:08-399 to 2024-05-27T11:13:23-0400)  Patient had a min HR of 45 bpm, max HR of 162 bpm, and avg HR of 72 bpm. Predominant underlying rhythm was Sinus Rhythm.  EVENTS: -22 Supraventricular Tachycardia runs occurred, the run with the fastest interval lasting 4 beats with a max rate of 162 bpm, the longest lasting 14.2 secs with an avg rate of 133 bpm.  -Isolated SVEs were occasional (2.4%, 15950), SVE Couplets were rare (<1.0%, 495), and SVE Triplets were rare (<1.0%, 138).  -Isolated VEs were rare (<1.0%), and no VE Couplets or VE Triplets were present.  No atrial fibrillation, sustained ventricular tachyarrhythmias, or bradyarrhythmias were detected.  Patient-triggered events corresponded with sinus rhythm and PACs.   CT SCANS  CT CORONARY MORPH W/CTA COR W/SCORE 03/12/2023  Addendum 03/15/2023 11:31 PM ADDENDUM REPORT: 03/15/2023 23:29  EXAM: OVER-READ INTERPRETATION  CT CHEST  The following report is an over-read performed by radiologist Dr. Suzen Dials of [facility] on 03/15/2023. This over-read does not include interpretation of cardiac or coronary anatomy or pathology. The coronary calcium  score/coronary CTA interpretation by the cardiologist is attached.  COMPARISON:  August 26, 2013  FINDINGS: Cardiovascular: There are  no significant extracardiac vascular findings.  Mediastinum/Nodes: There are no enlarged lymph nodes within the visualized mediastinum.  Lungs/Pleura: There is no pleural effusion. The visualized lungs appear clear.  Upper abdomen: No significant findings in the visualized upper abdomen.  Musculoskeletal/Chest wall: No chest wall mass or suspicious osseous findings within the visualized chest.  IMPRESSION: No significant extracardiac findings within the visualized  chest.   Electronically Signed By: Suzen Dials M.D. On: 03/15/2023 23:29  Narrative CLINICAL DATA:  Chest pain  EXAM: Cardiac CTA  MEDICATIONS: Sub lingual nitro. 4mg  x 2  TECHNIQUE: The patient was scanned on a Siemens 192 slice scanner. Gantry rotation speed was 250 msecs. Collimation was 0.6 mm. A 100 kV prospective scan was triggered in the ascending thoracic aorta at 35-75% of the R-R interval. Average HR during the scan was 60 bpm. The 3D data set was interpreted on a dedicated work station using MPR, MIP and VRT modes. A total of 80cc of contrast was used.  FINDINGS: Non-cardiac: See separate report from [facility].  No left atrial appendage thrombus. Pulmonary veins drain normally to the left atrium.  Calcium  Score: 285 Agatston units.  Coronary Arteries: Right dominant with no anomalies  LM: No plaque or stenosis.  LAD system: Calcified plaque proximal LAD, mild (1-24%) stenosis.  Circumflex system: Calcified plaque proximal LCx, mild (1-24%) stenosis.  RCA system: Calcified plaque mid RCA, mild (25-49%) stenosis.  IMPRESSION: 1. Coronary artery calcium  score 285 Agatston units. This places the patient in the 91st percentile for age and gender, suggesting high risk for future cardiac events.  2.  Mild nonobstructive CAD.  Dalton [employer] promotion account executive  Electronically Signed: By: Ezra Shuck M.D. On: 03/12/2023 16:38   CT SCANS  CT CORONARY MORPH W/CTA COR W/SCORE 08/26/2013  Addendum 08/26/2013  9:33 PM ADDENDUM REPORT: 08/26/2013 21:31  CLINICAL DATA:  Chest pain  EXAM: Cardiac CTA  MEDICATIONS: Sub lingual nitro. 4mg  and lopressor  5mg  IV  TECHNIQUE: The patient was scanned on a Philips 256 slice scanner. Gantry rotation speed was 270 msecs. Collimation was .9mm. A 100 kV prospective scan was triggered in the descending thoracic aorta at 111 HU's with 5% padding centered around 78% of the R-R interval. Average HR during the scan  was 58 bpm. The 3D data set was interpreted on a dedicated work station using MPR, MIP and VRT modes. A total of 80cc of contrast was used.  FINDINGS: Non-cardiac: See separate report from [facility]. No significant findings on limited lung and soft tissue windows.  Calcium  Score:  35.5 Agatston units  Coronary Arteries: Right dominant with no anomalies  LM:  No plaque or stenosis  LAD: Moderate 1st diagonal. Calcified plaque without significant stenosis in proximal LAD after D1.  Circumflex:  No plaque or stenosis in LCx system.  RCA:  Calcified plaque without significant stenosis in the mid RCA.  IMPRESSION: 1.  No obstructive CAD noted.  2. Coronary artery calcium  score of 35.5 Agatston units, placing the patient in the 83 percentile for her age and gender. This suggests high risk for future cardiac events.  Dalton Mclean   Electronically Signed By: Ezra Shuck M.D. On: 08/26/2013 21:31  Narrative CLINICAL DATA:  Chest pain  EXAM: OVER-READ INTERPRETATION  CT CHEST  The following report is an over-read performed by radiologist Dr. Franky Leff [facility] on 08/26/2013. This over-read does not include interpretation of cardiac or coronary anatomy or pathology. The coronary calcium  score interpretation by the cardiologist is attached.  COMPARISON:  01/24/2010  FINDINGS: No adenopathy in the visualized mediastinum or hila. Heart is normal size. Aorta is normal caliber. Minimal dependent atelectasis in the lungs. Small calcified granuloma posteriorly in the right lower lobe. No suspicious pulmonary nodules. No acute bony abnormality.  IMPRESSION: No acute or significant extracardiac abnormality.  Electronically Signed: By: Franky Crease M.D. On: 08/26/2013 15:27     ______________________________________________________________________________________________      Risk Assessment/Calculations  CHA2DS2-VASc Score = 5   This  indicates a 7.2% annual risk of stroke. The patient's score is based upon: CHF History: 0 HTN History: 1 Diabetes History: 1 Stroke History: 0 Vascular Disease History: 1 Age Score: 1 Gender Score: 1           Physical Exam VS:  BP (!) 158/80   Pulse 70   Ht 5' 5 (1.651 m)   Wt 171 lb 9.6 oz (77.8 kg)   SpO2 98%   BMI 28.56 kg/m        Wt Readings from Last 3 Encounters:  05/15/24 172 lb 4 oz (78.1 kg)  05/14/24 171 lb 9.6 oz (77.8 kg)  05/06/24 174 lb 0.6 oz (78.9 kg)    GEN: Well nourished, well developed in no acute distress NECK: No JVD; No carotid bruits CARDIAC: RRR, no murmurs, rubs, gallops RESPIRATORY:  Clear to auscultation without rales, wheezing or rhonchi  ABDOMEN: Soft, non-tender, non-distended EXTREMITIES:  No edema; No deformity   ASSESSMENT AND PLAN  CAD: Previous coronary CTA showed nonobstructive CAD.  Patient denies any chest pain  History of PAF status post ablation in 2001: No recurrence, not on anticoagulation therapy due to lack of recurrence  Hypertension: Blood pressure mildly elevated, however previously her blood pressure has been normal.  She will continue to monitor her blood pressure if systolic blood pressures persistently greater than 140 mmHg, she has been instructed to increase losartan  to 50 mg daily and contact us  so we can send in a new prescription  Hyperlipidemia: On atorvastatin   DM2: Managed by primary care provider.  NASH cirrhosis: Followed by GI service.       Dispo: Follow-up with Dr. Wendel in 6 months  Signed, Taia Bramlett, [place]

## 2024-05-15 ENCOUNTER — Ambulatory Visit (INDEPENDENT_AMBULATORY_CARE_PROVIDER_SITE_OTHER): Payer: PRIVATE HEALTH INSURANCE | Admitting: Internal Medicine

## 2024-05-15 ENCOUNTER — Telehealth: Payer: Self-pay | Admitting: Internal Medicine

## 2024-05-15 ENCOUNTER — Encounter: Payer: Self-pay | Admitting: Internal Medicine

## 2024-05-15 ENCOUNTER — Other Ambulatory Visit

## 2024-05-15 VITALS — BP 130/70 | HR 80 | Ht 62.0 in | Wt 172.2 lb

## 2024-05-15 DIAGNOSIS — M545 Low back pain, unspecified: Secondary | ICD-10-CM | POA: Diagnosis not present

## 2024-05-15 DIAGNOSIS — G8929 Other chronic pain: Secondary | ICD-10-CM | POA: Diagnosis not present

## 2024-05-15 DIAGNOSIS — Z1330 Encounter for screening examination for mental health and behavioral disorders, unspecified: Secondary | ICD-10-CM | POA: Diagnosis not present

## 2024-05-15 DIAGNOSIS — I8510 Secondary esophageal varices without bleeding: Secondary | ICD-10-CM | POA: Diagnosis not present

## 2024-05-15 DIAGNOSIS — G894 Chronic pain syndrome: Secondary | ICD-10-CM | POA: Diagnosis not present

## 2024-05-15 DIAGNOSIS — K7460 Unspecified cirrhosis of liver: Secondary | ICD-10-CM | POA: Diagnosis not present

## 2024-05-15 DIAGNOSIS — M791 Myalgia, unspecified site: Secondary | ICD-10-CM | POA: Diagnosis not present

## 2024-05-15 DIAGNOSIS — M47816 Spondylosis without myelopathy or radiculopathy, lumbar region: Secondary | ICD-10-CM | POA: Diagnosis not present

## 2024-05-15 DIAGNOSIS — Z006 Encounter for examination for normal comparison and control in clinical research program: Secondary | ICD-10-CM

## 2024-05-15 DIAGNOSIS — K7581 Nonalcoholic steatohepatitis (NASH): Secondary | ICD-10-CM

## 2024-05-15 NOTE — Telephone Encounter (Signed)
 I have pended a prescription for carvedilol .  Her cardiologist has okayed the use of this.  We talked about it today.  I think it makes sense to send it to her local pharmacy as she will start it and need a blood pressure check in about 2 weeks and we may increase the dose after that so I do not want to do a long-term prescription until we find out which dose is best.  Please contact her and make these arrangements and send the prescription after she is aware.

## 2024-05-15 NOTE — Progress Notes (Signed)
 Theresa Scott 68 y.o. 02-29-1956 [id number]  Assessment & Plan:   Encounter Diagnoses  Name Primary?   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  CBC, CMET, INR AFP in October + US  to screen for hepatocellular carcinoma  I do not think she has significant hepatic encephalopathy as a cause for her excessive fatigue.  I do wonder if it is not related to caring for 2 young children at her age and with her comorbidities.  I think it would be helpful to start carvedilol  at low-dose and titrate up as tolerated, this is thought to reduce progression of liver disease and portal hypertension.  I will check with cardiology.    Subjective:  07/04/2016 EGD with status post Roux-en-Y gastrojejunostomy.  All healthy appearing.    12/20/2017 colonoscopy with 2 polyps removed from the sigmoid and ascending colon diverticulosis in the right colon.  Polyps were hyperplastic and repeat recommended 10 years.    07/30/2018 patient seen in clinic by Dr. Avram for her nonalcoholic fatty liver disease.  At that time she had done all she could not respect her liver fibrosis her fibrosis score was calculated to F2-F3.  Recommend she follow-up in a year to recheck LFTs about every 6 months.  At that point did not have cirrhosis.    07/12/2022 ultrasound elastography of the liver with greater than 13 KP A.  (In patients with nonalcoholic liver disease values suggest compensated advanced chronic liver disease).  Also coarse hepatic echotexture with nodular surface contour.    07/31/2022 AST 40, ALT 34.  CBC with platelets low at 122.  IgA normal, ANA, ASMA, AMA all normal, iron studies normal.  PT/INR normal.  1.1/11.3. EGD 10/27/22 trace Gr I esophageal varices, HPP (5mm) bx BII anatomy s/p GJbypass   Chief Complaint: f/u cirrhosis  HPI Discussed the use of AI scribe software for clinical note transcription with  the patient, who gave verbal consent to proceed.  History of Present Illness   Theresa Scott is a 68 year old female with MASH cirrhosis who presents for follow-up. She is accompanied by her two grandchildren, aged four and six.  She is experiencing ongoing  excessive sleepiness and fatigue despite using a CPAP machine for her sleep apnea. Neurology has been consulted regarding these symptoms, and there is suspicion that her medication may be contributing to her sleepiness.  She recently completed a course of doxycycline  for a lesion on her neck.  Her blood pressure was recently elevated at 158/82 mmHg when seeing cardiology and she was told if her blood pressure is persistently greater than 140 systolic to double up on her losartan .  Otherwise blood pressures have been normal.. She experiences lightheadedness and persistent tiredness, which she attributes to her busy lifestyle and possibly her CPAP machine, as she felt better on days she did not use it while camping.      She has not noted significant weight gain or new edema.  Lab Results  Component Value Date   ALT 32 01/28/2024   AST 38 (H) 01/28/2024   ALKPHOS 71 01/28/2024   BILITOT 0.7 01/28/2024   Lab Results  Component Value Date   WBC 4.2 01/28/2024   HGB 14.0 01/28/2024   HCT 42.6 01/28/2024   MCV 92.1 01/28/2024   PLT 129.0 (L) 01/28/2024   Lab Results  Component Value Date   NA 141  01/28/2024   CL 108 01/28/2024   K 3.6 01/28/2024   CO2 24 01/28/2024   BUN 15 01/28/2024   CREATININE 0.68 01/28/2024   GFR 89.88 01/28/2024   CALCIUM  8.7 01/28/2024   PHOS 3.9 01/17/2023   ALBUMIN 4.1 01/28/2024   GLUCOSE 120 (H) 01/28/2024    Lab Results  Component Value Date   INR 1.2 (H) 01/28/2024   INR 1.1 (H) 07/23/2023   INR 1.1 (H) 10/30/2022  Alpha-fetoprotein July 22 was 2.5 it was 6.2 in April    Wt Readings from Last 3 Encounters:  05/15/24 172 lb 4 oz (78.1 kg)  05/14/24 171 lb 9.6 oz (77.8 kg)  05/06/24  174 lb 0.6 oz (78.9 kg)     Allergies  Allergen Reactions   Bee Venom Anaphylaxis   Latex Rash   Current Meds  Medication Sig   alendronate  (FOSAMAX ) 70 MG tablet TAKE 1 TABLET EVERY WEEK WITH A FULL GLASS OF WATER ON AN EMPTY STOMACH.   aspirin  EC 81 MG tablet Take 1 tablet (81 mg total) by mouth daily. Swallow whole.   atorvastatin  (LIPITOR) 20 MG tablet Take 1 tablet (20 mg total) by mouth daily.   baclofen (LIORESAL) 10 MG tablet Take 10 mg by mouth daily.   buPROPion  (WELLBUTRIN  XL) 150 MG 24 hr tablet TAKE 1 TABLET EVERY DAY   desvenlafaxine  (PRISTIQ ) 50 MG 24 hr tablet TAKE 1 TABLET EVERY DAY   empagliflozin  (JARDIANCE ) 10 MG TABS tablet Take 1 tablet (10 mg total) by mouth daily.   gabapentin  (NEURONTIN ) 300 MG capsule TAKE 2 CAPSULES EVERY DAY   losartan  (COZAAR ) 25 MG tablet TAKE 1 TABLET EVERY DAY   mupirocin  ointment (BACTROBAN ) 2 % Apply topically 2 (two) times daily. X 7 days   omeprazole  (PRILOSEC) 20 MG capsule Take 1 capsule (20 mg total) by mouth daily. 30 minutes before breakfast   ondansetron  (ZOFRAN -ODT) 8 MG disintegrating tablet Take 1 tablet (8 mg total) by mouth every 8 (eight) hours as needed for nausea.   Oxycodone HCl 10 MG TABS Take 10 mg by mouth 3 (three) times daily as needed (pain).   triamcinolone  cream (KENALOG ) 0.1 % Apply 1 Application topically 2 (two) times daily. As needed for poison ivy rash. (Patient taking differently: Apply 1 Application topically as needed. As needed for poison ivy rash.)   Past Medical History:  Diagnosis Date   Allergy    Anxiety    Cancer (HCC) 08-22-2022   Just found out I have cirrhosis   Cataract 2019   Chronic kidney disease    Complication of anesthesia    hard to wake up and swelling after kidney stone removal in 1980 per pt   Degenerative disc disease    Depression    Diabetes mellitus    Fatty liver disease, nonalcoholic    Gallstones    GERD (gastroesophageal reflux disease)    Hypercholesterolemia     Hypertension    Kidney stones    Liver cirrhosis secondary to NASH (HCC)    Nocturnal hypoxemia 05/23/2023   Obesity    OSA (obstructive sleep apnea) 11/22/2023   mild obstructive sleep apnea with an AHI of 9.5/h overall but moderate during REM sleep with a REM AHI of 16.0/h.   Osteoporosis    Pancreatitis    SOB (shortness of breath)    Swelling of both lower extremities    Past Surgical History:  Procedure Laterality Date   BACK SURGERY  1990,2003,2004,2010   BREAST SURGERY  CARDIAC CATHETERIZATION  2011   [place] [facility].    CARDIAC SURGERY  2002   ablation   CHOLECYSTECTOMY  1976   COLONOSCOPY  2008   ESOPHAGOGASTRODUODENOSCOPY N/A 01/27/2013   Procedure: ESOPHAGOGASTRODUODENOSCOPY (EGD) upper endoscopy;  Surgeon: Alm VEAR Angle, MD;  Location: [facility];  Service: General;  Laterality: N/A;   EYE SURGERY     GASTRIC BYPASS  08/22/2010   HAND SURGERY  2000   right   SPINE SURGERY  1990,2001,2004,2005,2015   SUBTHALAMIC STIMULATOR BATTERY REPLACEMENT  08/16/2020   TEE WITHOUT CARDIOVERSION N/A 03/07/2023   Procedure: TRANSESOPHAGEAL ECHOCARDIOGRAM;  Surgeon: Alvan Ronal BRAVO, MD;  Location: [facility];  Service: Cardiovascular;  Laterality: N/A;   TONSILLECTOMY  1971   Social History   Social History Narrative   Married 1 daughter (45) and 3 adopted children   Disabled, chronic back pain, 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   She enjoys crocheting       family history includes Addison's disease in her father; Alcohol abuse in her brother, brother, daughter, and mother; COPD in her brother, mother, and sister; Cancer in her brother, brother, mother, and sister; Cirrhosis in her sister; Colon cancer in her brother; Diabetes in her sister and sister; Heart attack in her sister; Kidney failure in her brother; Liver cancer in her brother; Lung cancer in her mother and sister; Miscarriages / [place] in her mother.   Review of Systems As  above  Objective:   Physical Exam @BP  130/70 (BP Location: Left Arm, Patient Position: Sitting, Cuff Size: Normal)   Pulse 80   Ht 5' 2 (1.575 m)   Wt 172 lb 4 oz (78.1 kg)   BMI 31.50 kg/m @  General:  NAD Eyes:   anicteric Lungs:  clear Heart::  S1S2 no rubs, murmurs or gallops Abdomen:  soft and nontender, BS+ Ext:   Multiple varicosities on the lower extremities with trace ankle and pretibial edema  Neurologic: She is alert and oriented x 3 and there is no asterixis  Data Reviewed:  See HPI

## 2024-05-15 NOTE — Patient Instructions (Addendum)
 Your provider has requested that you go to the basement level for lab work first of Oct. . Press B on the elevator. The lab is located at the first door on the left as you exit the elevator.   Dr Avram will check with your Cardiologist about starting Carvedilol .   You are due for an Ultrasound in Oct, we will contact you in Sept as a reminder. _________________________________________  If your blood pressure at your visit was 140/90 or greater, please contact your primary care physician to follow up on this.  _______________________________________________________  If you are age 90 or older, your body mass index should be between 23-30. Your Body mass index is 31.5 kg/m. If this is out of the aforementioned range listed, please consider follow up with your Primary Care Provider.  If you are age 48 or younger, your body mass index should be between 19-25. Your Body mass index is 31.5 kg/m. If this is out of the aformentioned range listed, please consider follow up with your Primary Care Provider.   ________________________________________________________  The [facility] providers would like to encourage you to use MYCHART to communicate with providers for non-urgent requests or questions.  Due to long hold times on the telephone, sending your provider a message by [facility] may be a faster and more efficient way to get a response.  Please allow 48 business hours for a response.  Please remember that this is for non-urgent requests.  _______________________________________________________  [facility] is using a team-based approach to care.  Your team is made up of your doctor and two to three APPS. Our APPS (Nurse Practitioners and Physician Assistants) work with your physician to ensure care continuity for you. They are fully qualified to address your health concerns and develop a treatment plan. They communicate directly with your gastroenterologist to care for you. Seeing the  Advanced Practice Practitioners on your physician's team can help you by facilitating care more promptly, often allowing for earlier appointments, access to diagnostic testing, procedures, and other specialty referrals.    Due to recent changes in healthcare laws, you may see the results of your imaging and laboratory studies on MyChart before your provider has had a chance to review them.  We understand that in some cases there may be results that are confusing or concerning to you. Not all laboratory results come back in the same time frame and the provider may be waiting for multiple results in order to interpret others.  Please give us  48 hours in order for your provider to thoroughly review all the results before contacting the office for clarification of your results.

## 2024-05-16 MED ORDER — CARVEDILOL 3.125 MG PO TABS: 3.1250 mg | ORAL_TABLET | Freq: Two times a day (BID) | ORAL | Status: DC

## 2024-05-16 NOTE — Telephone Encounter (Signed)
 Spoke with Theresa Scott and informed her of the message. She has carvedilol  at home so no Rx sent in. We set up an appointment to get her BP checked and she will also check it at home and bring us  the readings.

## 2024-05-31 LAB — GENECONNECT MOLECULAR SCREEN: Genetic Analysis Overall Interpretation: NEGATIVE

## 2024-06-02 ENCOUNTER — Ambulatory Visit: Payer: PRIVATE HEALTH INSURANCE

## 2024-06-04 ENCOUNTER — Telehealth: Payer: Self-pay | Admitting: Internal Medicine

## 2024-06-04 NOTE — Telephone Encounter (Signed)
 Theresa Scott came in Monday for a BP check. She is feeling a little better. Monday she was feeling really tired on the carvedilol . She cut the dose to once a day because she was feeling so tired. Please advise Sir.

## 2024-06-04 NOTE — Telephone Encounter (Signed)
 Patient called and wanted to know if you'd had the opportunity to speak with Dr. Avram about the medication you discussed at her last appointment.  Please call and advise.  Thank you.

## 2024-06-04 NOTE — Telephone Encounter (Signed)
 If it causes too much fatigue she can stop it  If 1x aday is ok would continue

## 2024-06-05 NOTE — Telephone Encounter (Signed)
 I left her a detailed  voice mail message with Dr Darilyn advise. I will try and reach her back this afternoon.

## 2024-06-05 NOTE — Telephone Encounter (Signed)
 I spoke with Theresa Scott and she got my message. She said she is feeling a little better today. She said she is going to try and continue one a day of the carvedilol  3.125 mg. She said now she wonders if she has been so tired because her PCP started her on bupropion  150mg  around the same time as the carvedilol . She is going to call them and I will let Dr Avram know this information.

## 2024-06-10 ENCOUNTER — Other Ambulatory Visit: Payer: Self-pay | Admitting: Family Medicine

## 2024-06-24 ENCOUNTER — Encounter: Payer: Self-pay | Admitting: Sports Medicine

## 2024-07-03 ENCOUNTER — Telehealth: Payer: Self-pay

## 2024-07-03 DIAGNOSIS — K7460 Unspecified cirrhosis of liver: Secondary | ICD-10-CM

## 2024-07-03 NOTE — Telephone Encounter (Signed)
 Complete abdominal U/S set up for 07/23/2024 at [facility]. Arrive at 8:15am, for 8:30am appointment. Kynslie aware of date/time and location. She was given the # if she needs to r/s, # [phone].

## 2024-07-03 NOTE — Telephone Encounter (Signed)
-----   Message from [facility] Ed Laclede J sent at 02/01/2024  6:13 PM EDT ----- Set up US  for NASH in October 2025. Call her first to get dates/times.

## 2024-07-16 DIAGNOSIS — G8929 Other chronic pain: Secondary | ICD-10-CM | POA: Diagnosis not present

## 2024-07-16 DIAGNOSIS — M545 Low back pain, unspecified: Secondary | ICD-10-CM | POA: Diagnosis not present

## 2024-07-16 DIAGNOSIS — Z981 Arthrodesis status: Secondary | ICD-10-CM | POA: Diagnosis not present

## 2024-07-16 DIAGNOSIS — M47816 Spondylosis without myelopathy or radiculopathy, lumbar region: Secondary | ICD-10-CM | POA: Diagnosis not present

## 2024-07-16 DIAGNOSIS — G894 Chronic pain syndrome: Secondary | ICD-10-CM | POA: Diagnosis not present

## 2024-07-23 ENCOUNTER — Ambulatory Visit: Payer: Self-pay | Admitting: Internal Medicine

## 2024-07-23 ENCOUNTER — Ambulatory Visit (HOSPITAL_COMMUNITY)
Admission: RE | Admit: 2024-07-23 | Discharge: 2024-07-23 | Disposition: A | Discharge status: Home / Self Care | Payer: PRIVATE HEALTH INSURANCE | Source: Ambulatory Visit | Attending: Internal Medicine | Admitting: Internal Medicine

## 2024-07-23 DIAGNOSIS — K7460 Unspecified cirrhosis of liver: Secondary | ICD-10-CM | POA: Diagnosis not present

## 2024-07-23 DIAGNOSIS — K7581 Nonalcoholic steatohepatitis (NASH): Secondary | ICD-10-CM | POA: Diagnosis not present

## 2024-07-23 DIAGNOSIS — Z9049 Acquired absence of other specified parts of digestive tract: Secondary | ICD-10-CM | POA: Diagnosis not present

## 2024-07-23 DIAGNOSIS — K7469 Other cirrhosis of liver: Secondary | ICD-10-CM | POA: Diagnosis not present

## 2024-07-23 DIAGNOSIS — K838 Other specified diseases of biliary tract: Secondary | ICD-10-CM | POA: Diagnosis not present

## 2024-07-30 ENCOUNTER — Other Ambulatory Visit: Payer: Self-pay | Admitting: Internal Medicine

## 2024-08-08 DIAGNOSIS — M791 Myalgia, unspecified site: Secondary | ICD-10-CM | POA: Diagnosis not present

## 2024-08-08 DIAGNOSIS — M546 Pain in thoracic spine: Secondary | ICD-10-CM | POA: Diagnosis not present

## 2024-08-08 DIAGNOSIS — G894 Chronic pain syndrome: Secondary | ICD-10-CM | POA: Diagnosis not present

## 2024-08-19 ENCOUNTER — Other Ambulatory Visit (INDEPENDENT_AMBULATORY_CARE_PROVIDER_SITE_OTHER)

## 2024-08-19 DIAGNOSIS — K7469 Other cirrhosis of liver: Secondary | ICD-10-CM

## 2024-08-19 DIAGNOSIS — K7581 Nonalcoholic steatohepatitis (NASH): Secondary | ICD-10-CM

## 2024-08-19 LAB — CBC WITH DIFFERENTIAL/PLATELET
Basophils Absolute: 0 K/uL (ref 0.0–0.1)
Basophils Relative: 0.5 % (ref 0.0–3.0)
Eosinophils Absolute: 0.1 K/uL (ref 0.0–0.7)
Eosinophils Relative: 1.4 % (ref 0.0–5.0)
HCT: 39.5 % (ref 36.0–46.0)
Hemoglobin: 12.9 g/dL (ref 12.0–15.0)
Lymphocytes Relative: 20.4 % (ref 12.0–46.0)
Lymphs Abs: 1.2 K/uL (ref 0.7–4.0)
MCHC: 32.7 g/dL (ref 30.0–36.0)
MCV: 89.2 fl (ref 78.0–100.0)
Monocytes Absolute: 0.7 K/uL (ref 0.1–1.0)
Monocytes Relative: 12.7 % — ABNORMAL HIGH (ref 3.0–12.0)
Neutro Abs: 3.8 K/uL (ref 1.4–7.7)
Neutrophils Relative %: 65 % (ref 43.0–77.0)
Platelets: 137 K/uL — ABNORMAL LOW (ref 150.0–400.0)
RBC: 4.43 Mil/uL (ref 3.87–5.11)
RDW: 13.7 % (ref 11.5–15.5)
WBC: 5.8 K/uL (ref 4.0–10.5)

## 2024-08-19 LAB — COMPREHENSIVE METABOLIC PANEL WITH GFR
ALT: 31 U/L (ref 0–35)
AST: 33 U/L (ref 0–37)
Albumin: 4 g/dL (ref 3.5–5.2)
Alkaline Phosphatase: 70 U/L (ref 39–117)
BUN: 15 mg/dL (ref 6–23)
CO2: 30 meq/L (ref 19–32)
Calcium: 8.6 mg/dL (ref 8.4–10.5)
Chloride: 105 meq/L (ref 96–112)
Creatinine, Ser: 0.66 mg/dL (ref 0.40–1.20)
GFR: 90.18 mL/min (ref >60.00)
Glucose, Bld: 99 mg/dL (ref 70–99)
Potassium: 4.7 meq/L (ref 3.5–5.1)
Sodium: 143 meq/L (ref 135–145)
Total Bilirubin: 0.6 mg/dL (ref 0.2–1.2)
Total Protein: 7.2 g/dL (ref 6.0–8.3)

## 2024-08-19 LAB — PROTIME-INR
INR: 1.1 ratio — ABNORMAL HIGH (ref 0.8–1.0)
Prothrombin Time: 11.3 s (ref 9.6–13.1)

## 2024-08-21 ENCOUNTER — Encounter: Payer: Self-pay | Admitting: Family Medicine

## 2024-08-21 ENCOUNTER — Ambulatory Visit: Payer: PRIVATE HEALTH INSURANCE | Admitting: Family Medicine

## 2024-08-21 VITALS — BP 132/76 | HR 69 | Ht 62.0 in | Wt 172.0 lb

## 2024-08-21 DIAGNOSIS — E119 Type 2 diabetes mellitus without complications: Secondary | ICD-10-CM

## 2024-08-21 DIAGNOSIS — R7301 Impaired fasting glucose: Secondary | ICD-10-CM

## 2024-08-21 DIAGNOSIS — K7581 Nonalcoholic steatohepatitis (NASH): Secondary | ICD-10-CM | POA: Diagnosis not present

## 2024-08-21 DIAGNOSIS — K7469 Other cirrhosis of liver: Secondary | ICD-10-CM | POA: Diagnosis not present

## 2024-08-21 DIAGNOSIS — D696 Thrombocytopenia, unspecified: Secondary | ICD-10-CM | POA: Diagnosis not present

## 2024-08-21 DIAGNOSIS — J069 Acute upper respiratory infection, unspecified: Secondary | ICD-10-CM

## 2024-08-21 DIAGNOSIS — F322 Major depressive disorder, single episode, severe without psychotic features: Secondary | ICD-10-CM

## 2024-08-21 LAB — POCT GLYCOSYLATED HEMOGLOBIN (HGB A1C): Hemoglobin A1C: 6.2 % — AB (ref 4.0–5.6)

## 2024-08-21 LAB — POCT UA - MICROALBUMIN
Albumin/Creatinine Ratio, Urine, POC: <30
Creatinine, POC: 100 mg/dL
Microalbumin Ur, POC: 30 mg/L

## 2024-08-21 LAB — AFP TUMOR MARKER: AFP-Tumor Marker: 5.9 ng/mL

## 2024-08-21 NOTE — Progress Notes (Signed)
 Established Patient Office Visit  Patient ID: Theresa Scott, female    DOB: Mar 25, 1956  Age: 68 y.o. MRN: [id number] PCP: Alvan Dorothyann JONETTA, MD  Chief Complaint  Patient presents with   Diabetes    Subjective:     HPI  Discussed the use of AI scribe software for clinical note transcription with the patient, who gave verbal consent to proceed.  History of Present Illness Theresa Scott is a 68 year old female with diabetes and cirrhosis who presents with fatigue and cold symptoms.  Fatigue and somnolence - Significant fatigue present - Some days unable to stay awake, including yesterday - Currently taking Pristiq  for mood, but mood remains 'so, so' - Occasionally takes bupropion  (Wellbutrin ) on particularly bad days, not daily  Upper respiratory symptoms - Onset this week - Sinus congestion, cough, and yellow phlegm production - No fever - Recent exposure to family members with pneumonia and bronchitis; illness spread from daughter to grandson, then to patient, now to husband - Daughter was ill for almost six weeks, requiring multiple hospital and urgent care visits  Hematologic and hepatic findings - History of cirrhosis and thrombocytopenia - Recent blood work showed stable but low platelets - Abnormal laboratory result starting with 'MOC', not yet discussed with liver specialist  Glycemic control - Recent completion of steroid taper pack - Hemoglobin A1c now 6.2, slightly higher than usual range in the fives - Belief that steroid taper may have affected glycemic control  Dermatologic symptoms - Habitual skin picking, particularly at a spot on the neck - Sensation of 'a bristle in there' at the neck lesion - Uses Band-Aids to prevent picking - Applies cool packs to alleviate itching  Immunization status and adverse reactions - No flu shot received this year due to prior adverse reaction (bedridden for a week last year) - Aware of new RSV vaccine but has not  yet decided to receive it     ROS    Objective:     BP 132/76   Pulse 69   Ht 5' 2 (1.575 m)   Wt 172 lb 0.6 oz (78 kg)   SpO2 98%   BMI 31.47 kg/m    Physical Exam Vitals and nursing note reviewed.  Constitutional:      Appearance: Normal appearance.  HENT:     Head: Normocephalic and atraumatic.     Right Ear: Tympanic membrane, ear canal and external ear normal. There is no impacted cerumen.     Left Ear: Tympanic membrane, ear canal and external ear normal. There is no impacted cerumen.     Nose: Nose normal.     Mouth/Throat:     Pharynx: Oropharynx is clear.  Eyes:     Conjunctiva/sclera: Conjunctivae normal.  Cardiovascular:     Rate and Rhythm: Normal rate and regular rhythm.  Pulmonary:     Effort: Pulmonary effort is normal.     Breath sounds: Normal breath sounds.  Musculoskeletal:     Cervical back: Neck supple. No tenderness.  Lymphadenopathy:     Cervical: No cervical adenopathy.  Skin:    General: Skin is warm and dry.  Neurological:     Mental Status: She is alert and oriented to person, place, and time.  Psychiatric:        Mood and Affect: Mood normal.      Results for orders placed or performed in visit on 08/21/24  POCT HgB A1C  Result Value Ref Range   Hemoglobin A1C 6.2 (  A) 4.0 - 5.6 %   HbA1c POC (<> result, manual entry)     HbA1c, POC (prediabetic range)     HbA1c, POC (controlled diabetic range)    POCT UA - Microalbumin  Result Value Ref Range   Microalbumin Ur, POC 30 mg/L   Creatinine, POC 100 mg/dL   Albumin/Creatinine Ratio, Urine, POC <30       The ASCVD Risk score (Arnett DK, et al., 2019) failed to calculate for the following reasons:   The valid total cholesterol range is 130 to 320 mg/dL    Assessment & Plan:   Problem List Items Addressed This Visit       Digestive   Liver cirrhosis secondary to NASH (HCC)   Nonalcoholic Steatohepatitis (NASH) with Cirrhosis NASH with cirrhosis causing fatigue.  Cardizem recently stopped per liver specialist. Platelet levels stable. - Continue monitoring liver function and platelet levels. - Encourage energy conservation and rest.        Endocrine   Impaired fasting glucose    A1c increased to 6.2% post-steroid course. - Encourage annual eye exam for diabetic retinopathy.        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 Low mood and fatigue present. Intermittent bupropion  use noted. - Initiate daily bupropion  for one month to assess mood and energy improvement.      Other Visit Diagnoses       Type 2 diabetes mellitus without complication, without long-term current use of insulin (HCC)    -  Primary   Relevant Orders   POCT HgB A1C (Completed)   POCT UA - Microalbumin (Completed)     Viral upper respiratory tract infection           Assessment and Plan Assessment & Plan Acute Upper Respiratory Infection Viral upper respiratory infection with sinus congestion and cough. No fever, clear lungs, recent exposure to similar symptoms. - Monitor symptoms through the weekend, reassess if not improved by early next week. - Instruct to contact if symptoms worsen or persist after the weekend.  Thrombocytopenia Chronic thrombocytopenia with stable platelet levels.  General Health Maintenance Discussed vaccinations, including flu and RSV. Hesitant about flu vaccine due to past reaction. RSV vaccine eligibility due to diabetes and cirrhosis discussed. - Advise obtaining RSV vaccine at pharmacy due to diabetes and cirrhosis risk factors.   Return in about 4 months (around 12/20/2024) for Diabetes follow-up.    Dorothyann Byars, MD [facility] at [facility]

## 2024-08-21 NOTE — Assessment & Plan Note (Signed)
 A1c increased to 6.2% post-steroid course. - Encourage annual eye exam for diabetic retinopathy.

## 2024-08-21 NOTE — Assessment & Plan Note (Signed)
 Nonalcoholic Steatohepatitis (NASH) with Cirrhosis NASH with cirrhosis causing fatigue. Cardizem recently stopped per liver specialist. Platelet levels stable. - Continue monitoring liver function and platelet levels. - Encourage energy conservation and rest.

## 2024-08-21 NOTE — Assessment & Plan Note (Signed)
 Depression- MDD Depression treated with Pristiq . Low mood and fatigue present. Intermittent bupropion  use noted. - Initiate daily bupropion  for one month to assess mood and energy improvement.

## 2024-08-22 ENCOUNTER — Ambulatory Visit: Payer: Self-pay | Admitting: Internal Medicine

## 2024-08-26 ENCOUNTER — Telehealth: Payer: Self-pay | Admitting: Internal Medicine

## 2024-08-26 ENCOUNTER — Telehealth (HOSPITAL_COMMUNITY): Payer: Self-pay | Admitting: Pharmacy Technician

## 2024-08-26 NOTE — Telephone Encounter (Signed)
 Patient has dropped off RX Med Assistance paperwork - placing in the mail box below [employer].  Thank you.

## 2024-08-26 NOTE — Telephone Encounter (Signed)
    Will check box tomorrow when onsite

## 2024-08-26 NOTE — Telephone Encounter (Signed)
 New encounter created to work on   I will get when in office tomorrow

## 2024-08-27 NOTE — Telephone Encounter (Signed)
 Sent to Hao for signature.

## 2024-08-27 NOTE — Telephone Encounter (Signed)
 I have faxed the 6 pages (includes cover sheet) to [phone].   The Fax report says that it was sent successfully.

## 2024-08-27 NOTE — Telephone Encounter (Signed)
 Hi, can someone please get the provider to sign the form that is scanned in media under [employer] ([employer] APPLICATION for this patient and fax back to [phone]? Thank you!

## 2024-08-27 NOTE — Telephone Encounter (Signed)
 I have completed my portion and gave the paperwork to [facility] to fax. Thank you Lona Six

## 2024-08-27 NOTE — Telephone Encounter (Signed)
 Faxed application to [employer]

## 2024-09-01 NOTE — Telephone Encounter (Signed)
 Approved per [employer]   08/29/2024- 10/22/25  [id number]  This is a renewal  She said it was mailed 08/01/24 for 90 days next fill is 10/30/24  She is set up for text reminders and she responds that way per rep

## 2024-09-08 ENCOUNTER — Other Ambulatory Visit: Payer: Self-pay | Admitting: Orthopedic Surgery

## 2024-09-08 DIAGNOSIS — M546 Pain in thoracic spine: Secondary | ICD-10-CM | POA: Diagnosis not present

## 2024-09-08 DIAGNOSIS — M545 Low back pain, unspecified: Secondary | ICD-10-CM | POA: Diagnosis not present

## 2024-09-08 DIAGNOSIS — M47816 Spondylosis without myelopathy or radiculopathy, lumbar region: Secondary | ICD-10-CM

## 2024-09-08 DIAGNOSIS — G8929 Other chronic pain: Secondary | ICD-10-CM | POA: Diagnosis not present

## 2024-10-08 ENCOUNTER — Other Ambulatory Visit: Payer: Self-pay | Admitting: Family Medicine

## 2024-10-08 DIAGNOSIS — E785 Hyperlipidemia, unspecified: Secondary | ICD-10-CM

## 2024-10-28 ENCOUNTER — Ambulatory Visit: Payer: Self-pay

## 2024-10-28 ENCOUNTER — Ambulatory Visit

## 2024-10-28 VITALS — BP 107/68 | HR 79 | Ht 62.0 in

## 2024-10-28 DIAGNOSIS — R300 Dysuria: Secondary | ICD-10-CM | POA: Diagnosis not present

## 2024-10-28 LAB — POCT URINALYSIS DIP (CLINITEK)
Bilirubin, UA: NEGATIVE
Blood, UA: NEGATIVE
Glucose, UA: >=1000 mg/dL — AB
Ketones, POC UA: NEGATIVE mg/dL
Leukocytes, UA: NEGATIVE
Nitrite, UA: NEGATIVE
POC PROTEIN,UA: NEGATIVE
Spec Grav, UA: 1.015
Urobilinogen, UA: 1 U/dL
pH, UA: 5.5

## 2024-10-28 NOTE — Progress Notes (Signed)
" ° °  Established Patient Office Visit  Subjective   Patient ID: Theresa Scott, female    DOB: Dec 19, 1955  Age: 69 y.o. MRN: [id number]  Chief Complaint  Patient presents with   Dysuria    Patient c/o  dysuria, lower abdominal pain,   blood  with wiping after urination , urine cloudy  with odor. X Saturday 10/25/2024.  Patient started drinking a lot of water on Sunday 10/26/2024 and urine is no longer cloudy and is without odor now. She no longer sees blood in urine with wiping  - she does still have some dysuria and a lot of urinary frequency.     HPI  Dysuria and urine frequency-  Chief Complaint  Patient presents with   Dysuria    Patient c/o  dysuria, lower abdominal pain,   blood  with wiping after urination , urine cloudy  with odor. X Saturday 10/25/2024.  Patient started drinking a lot of water on Sunday 10/26/2024 and urine is no longer cloudy and is without odor now. She no longer sees blood in urine with wiping  - she does still have some dysuria and a lot of urinary frequency.     ROS    Objective:     BP 107/68 (BP Location: Left Arm, Patient Position: Sitting, Cuff Size: Normal)   Pulse 79   Ht 5' 2 (1.575 m)   SpO2 97%   BMI 31.47 kg/m    Physical Exam   Results for orders placed or performed in visit on 10/28/24  POCT URINALYSIS DIP (CLINITEK)  Result Value Ref Range   Color, UA yellow yellow   Clarity, UA clear clear   Glucose, UA >=1,000 (A) negative mg/dL   Bilirubin, UA negative negative   Ketones, POC UA negative negative mg/dL   Spec Grav, UA 8.984 8.989 - 1.025   Blood, UA negative negative   pH, UA 5.5 5.0 - 8.0   POC PROTEIN,UA negative negative, trace   Urobilinogen, UA 1.0 0.2 or 1.0 E.U./dL   Nitrite, UA Negative Negative   Leukocytes, UA Negative Negative      The ASCVD Risk score (Arnett DK, et al., 2019) failed to calculate for the following reasons:   The valid total cholesterol range is 130 to 320 mg/dL    Assessment & Plan:   Urinalysis and urine culture collected . Urine culture sent  to [facility] . Patient instructed to check home Glucose reading and to let us  know if this reading is a high or concerning number as well. Return to clinic if symptoms worsen or fail to improve.  Problem List Items Addressed This Visit   None Visit Diagnoses       Dysuria    -  Primary   Relevant Orders   POCT URINALYSIS DIP (CLINITEK) (Completed)   Urine Culture       No follow-ups on file.    Suzen SHAUNNA Plenty, LPN  "

## 2024-10-28 NOTE — Patient Instructions (Signed)
Return to clinic if symptoms worsen or fail to improve.

## 2024-10-28 NOTE — Telephone Encounter (Signed)
 FYI Only or Action Required?: FYI only for provider: UC advised.  Patient was last seen in primary care on 08/21/2024 by Alvan Dorothyann BIRCH, MD.  Called Nurse Triage reporting Urinary Tract Infection.  Symptoms began several days ago.  Interventions attempted: Nothing.  Symptoms are: gradually worsening.  Triage Disposition: See HCP Within 4 Hours (Or PCP Triage)  Patient/caregiver understands and will follow disposition?: Yes, will follow disposition  Copied from CRM #[id number]. Topic: Clinical - Red Word Triage >> Oct 28, 2024  9:28 AM [name] wrote: [facility] that prompted transfer to Nurse Triage: bladder infection, burning, hurting, passing blood, urgency, uncomfortable, and when she poos Reason for Disposition  Side (flank) or lower back pain present  Answer Assessment - Initial Assessment Questions 1. SEVERITY: How bad is the pain?  (e.g., Scale 1-10; mild, moderate, or severe)     5 2. FREQUENCY: How many times have you had painful urination today?      Every time pt uses the bathroom 3. PATTERN: Is pain present every time you urinate or just sometimes?      Every time 4. ONSET: When did the painful urination start?      3 days ago 5. FEVER: Do you have a fever? If Yes, ask: What is your temperature, how was it measured, and when did it start?     denies 6. PAST UTI: Have you had a urine infection before? If Yes, ask: When was the last time? and What happened that time?      Last was about a year ago 7. CAUSE: What do you think is causing the painful urination?  (e.g., UTI, scratch, Herpes sore)     UTI 8. OTHER SYMPTOMS: Do you have any other symptoms? (e.g., blood in urine, flank pain, genital sores, urgency, vaginal discharge)     Blood in urine, burning,  No appts available, pt advised to utilize UC.  Protocols used: Urination Pain - Female-A-AH

## 2024-10-28 NOTE — Telephone Encounter (Signed)
 Called patient and put her on the nurse schedule for today at 330.

## 2024-10-29 ENCOUNTER — Other Ambulatory Visit: Payer: Self-pay | Admitting: Family Medicine

## 2024-10-29 DIAGNOSIS — M810 Age-related osteoporosis without current pathological fracture: Secondary | ICD-10-CM

## 2024-10-30 ENCOUNTER — Encounter: Payer: Self-pay | Admitting: Internal Medicine

## 2024-10-30 ENCOUNTER — Ambulatory Visit: Payer: Self-pay | Admitting: Family Medicine

## 2024-10-30 LAB — URINE CULTURE

## 2024-10-30 NOTE — Telephone Encounter (Unsigned)
 Copied from CRM #[id number]. Topic: Clinical - Medical Advice >> Oct 30, 2024  2:15 PM [name] wrote: Reason for CRM: Patient called back.. said she is feeling better

## 2024-10-30 NOTE — Progress Notes (Signed)
 Last read by Consuelo JONETTA Orange at 9:15AM on 10/30/2024.  Placed call to patient - went to voicemail - was just checking that patient was starting to feel better.

## 2024-10-30 NOTE — Progress Notes (Signed)
 Hi Nivea, good news urine culture is negative.  No sign of a urinary tract infection.  Just try to really increase your water intake.

## 2024-12-17 ENCOUNTER — Ambulatory Visit: Payer: PRIVATE HEALTH INSURANCE | Admitting: Family Medicine

## 2025-04-21 ENCOUNTER — Ambulatory Visit: Payer: PRIVATE HEALTH INSURANCE
